# Patient Record
Sex: Female | Born: 1965 | Race: White | Hispanic: No | Marital: Married | Smoking: Never smoker
Health system: Southern US, Community
[De-identification: ages and names within clinical notes are randomized; demographics above are authoritative.]

## PROBLEM LIST (undated history)

## (undated) DIAGNOSIS — I10 Essential (primary) hypertension: Secondary | ICD-10-CM

## (undated) DIAGNOSIS — R51 Headache: Secondary | ICD-10-CM

## (undated) DIAGNOSIS — R29898 Other symptoms and signs involving the musculoskeletal system: Secondary | ICD-10-CM

## (undated) DIAGNOSIS — F329 Major depressive disorder, single episode, unspecified: Secondary | ICD-10-CM

## (undated) DIAGNOSIS — R519 Headache, unspecified: Secondary | ICD-10-CM

## (undated) DIAGNOSIS — Z9689 Presence of other specified functional implants: Secondary | ICD-10-CM

## (undated) DIAGNOSIS — R569 Unspecified convulsions: Secondary | ICD-10-CM

## (undated) DIAGNOSIS — M549 Dorsalgia, unspecified: Secondary | ICD-10-CM

## (undated) DIAGNOSIS — F419 Anxiety disorder, unspecified: Secondary | ICD-10-CM

## (undated) DIAGNOSIS — D649 Anemia, unspecified: Secondary | ICD-10-CM

## (undated) DIAGNOSIS — E119 Type 2 diabetes mellitus without complications: Secondary | ICD-10-CM

## (undated) DIAGNOSIS — E039 Hypothyroidism, unspecified: Secondary | ICD-10-CM

## (undated) DIAGNOSIS — F32A Depression, unspecified: Secondary | ICD-10-CM

## (undated) DIAGNOSIS — Z973 Presence of spectacles and contact lenses: Secondary | ICD-10-CM

## (undated) DIAGNOSIS — E785 Hyperlipidemia, unspecified: Secondary | ICD-10-CM

## (undated) DIAGNOSIS — D510 Vitamin B12 deficiency anemia due to intrinsic factor deficiency: Secondary | ICD-10-CM

## (undated) DIAGNOSIS — E669 Obesity, unspecified: Secondary | ICD-10-CM

## (undated) DIAGNOSIS — G43909 Migraine, unspecified, not intractable, without status migrainosus: Secondary | ICD-10-CM

## (undated) DIAGNOSIS — Z8781 Personal history of (healed) traumatic fracture: Secondary | ICD-10-CM

## (undated) DIAGNOSIS — Z9889 Other specified postprocedural states: Secondary | ICD-10-CM

## (undated) DIAGNOSIS — G8929 Other chronic pain: Secondary | ICD-10-CM

## (undated) HISTORY — DX: Personal history of (healed) traumatic fracture: Z87.81

## (undated) HISTORY — DX: Unspecified convulsions: R56.9

## (undated) HISTORY — DX: Type 2 diabetes mellitus without complications: E11.9

## (undated) HISTORY — DX: Major depressive disorder, single episode, unspecified: F32.9

## (undated) HISTORY — DX: Hyperlipidemia, unspecified: E78.5

## (undated) HISTORY — DX: Essential (primary) hypertension: I10

## (undated) HISTORY — DX: Vitamin B12 deficiency anemia due to intrinsic factor deficiency: D51.0

## (undated) HISTORY — PX: KNEE SURGERY: SHX244

## (undated) HISTORY — DX: Anxiety disorder, unspecified: F41.9

## (undated) HISTORY — DX: Depression, unspecified: F32.A

## (undated) HISTORY — PX: NASAL SINUS SURGERY: SHX719

## (undated) HISTORY — DX: Hypothyroidism, unspecified: E03.9

## (undated) HISTORY — DX: Anemia, unspecified: D64.9

## (undated) HISTORY — DX: Obesity, unspecified: E66.9

## (undated) HISTORY — DX: Migraine, unspecified, not intractable, without status migrainosus: G43.909

---

## 1998-11-15 ENCOUNTER — Encounter: Admission: RE | Admit: 1998-11-15 | Discharge: 1999-02-13 | Payer: Self-pay | Admitting: Family Medicine

## 1999-03-28 ENCOUNTER — Other Ambulatory Visit: Admission: RE | Admit: 1999-03-28 | Discharge: 1999-03-28 | Payer: Self-pay | Admitting: Orthopaedic Surgery

## 2000-04-15 ENCOUNTER — Encounter: Payer: Self-pay | Admitting: Family Medicine

## 2001-04-15 HISTORY — PX: OTHER SURGICAL HISTORY: SHX169

## 2001-04-20 ENCOUNTER — Ambulatory Visit (HOSPITAL_BASED_OUTPATIENT_CLINIC_OR_DEPARTMENT_OTHER): Admission: RE | Admit: 2001-04-20 | Discharge: 2001-04-20 | Payer: Self-pay | Admitting: Family Medicine

## 2002-01-13 HISTORY — PX: OTHER SURGICAL HISTORY: SHX169

## 2002-02-13 HISTORY — PX: LIVER BIOPSY: SHX301

## 2002-02-13 HISTORY — PX: CHOLECYSTECTOMY: SHX55

## 2003-09-16 HISTORY — PX: DILATION AND CURETTAGE OF UTERUS: SHX78

## 2003-09-22 ENCOUNTER — Other Ambulatory Visit: Payer: Self-pay

## 2004-07-23 ENCOUNTER — Ambulatory Visit: Payer: Self-pay | Admitting: Family Medicine

## 2004-09-02 ENCOUNTER — Ambulatory Visit: Payer: Self-pay | Admitting: Family Medicine

## 2004-09-26 ENCOUNTER — Ambulatory Visit: Payer: Self-pay | Admitting: Family Medicine

## 2004-11-07 ENCOUNTER — Ambulatory Visit: Payer: Self-pay | Admitting: Family Medicine

## 2004-11-13 ENCOUNTER — Ambulatory Visit: Payer: Self-pay | Admitting: Family Medicine

## 2004-12-10 ENCOUNTER — Ambulatory Visit: Payer: Self-pay | Admitting: Family Medicine

## 2005-01-14 ENCOUNTER — Encounter: Payer: Self-pay | Admitting: Unknown Physician Specialty

## 2005-03-05 ENCOUNTER — Ambulatory Visit: Payer: Self-pay | Admitting: Family Medicine

## 2005-04-01 ENCOUNTER — Ambulatory Visit: Payer: Self-pay | Admitting: Family Medicine

## 2005-04-18 ENCOUNTER — Ambulatory Visit: Payer: Self-pay | Admitting: Family Medicine

## 2005-05-14 ENCOUNTER — Ambulatory Visit: Payer: Self-pay | Admitting: Family Medicine

## 2005-07-21 ENCOUNTER — Ambulatory Visit: Payer: Self-pay | Admitting: Family Medicine

## 2005-08-01 ENCOUNTER — Ambulatory Visit: Payer: Self-pay | Admitting: Family Medicine

## 2005-08-05 ENCOUNTER — Ambulatory Visit: Payer: Self-pay | Admitting: Family Medicine

## 2005-09-24 ENCOUNTER — Encounter: Payer: Self-pay | Admitting: Orthopaedic Surgery

## 2005-10-16 ENCOUNTER — Encounter: Payer: Self-pay | Admitting: Orthopaedic Surgery

## 2005-11-05 ENCOUNTER — Ambulatory Visit: Payer: Self-pay | Admitting: Family Medicine

## 2005-12-01 ENCOUNTER — Ambulatory Visit: Payer: Self-pay | Admitting: Family Medicine

## 2005-12-08 ENCOUNTER — Ambulatory Visit: Payer: Self-pay | Admitting: Family Medicine

## 2005-12-14 ENCOUNTER — Ambulatory Visit: Payer: Self-pay | Admitting: Family Medicine

## 2006-02-06 ENCOUNTER — Ambulatory Visit: Payer: Self-pay | Admitting: Family Medicine

## 2006-03-24 ENCOUNTER — Ambulatory Visit: Payer: Self-pay | Admitting: Family Medicine

## 2006-05-05 ENCOUNTER — Ambulatory Visit: Payer: Self-pay | Admitting: Family Medicine

## 2006-05-21 ENCOUNTER — Encounter: Admission: RE | Admit: 2006-05-21 | Discharge: 2006-05-21 | Payer: Self-pay | Admitting: Family Medicine

## 2006-06-15 ENCOUNTER — Ambulatory Visit: Payer: Self-pay | Admitting: Internal Medicine

## 2006-09-04 ENCOUNTER — Ambulatory Visit: Payer: Self-pay | Admitting: Family Medicine

## 2006-10-02 ENCOUNTER — Ambulatory Visit: Payer: Self-pay | Admitting: Family Medicine

## 2006-10-02 LAB — CONVERTED CEMR LAB
ALT: 43 units/L — ABNORMAL HIGH (ref 0–40)
AST: 27 units/L (ref 0–37)
LDL Cholesterol: 49 mg/dL (ref 0–99)
Total CHOL/HDL Ratio: 3.2
VLDL: 27 mg/dL (ref 0–40)

## 2006-10-22 ENCOUNTER — Ambulatory Visit: Payer: Self-pay | Admitting: Family Medicine

## 2006-12-15 ENCOUNTER — Ambulatory Visit: Payer: Self-pay | Admitting: Family Medicine

## 2006-12-21 ENCOUNTER — Ambulatory Visit: Payer: Self-pay | Admitting: Family Medicine

## 2006-12-21 LAB — CONVERTED CEMR LAB: Hgb A1c MFr Bld: 6.5 % — ABNORMAL HIGH (ref 4.6–6.0)

## 2007-05-15 ENCOUNTER — Encounter: Payer: Self-pay | Admitting: Family Medicine

## 2007-05-18 ENCOUNTER — Encounter: Payer: Self-pay | Admitting: Family Medicine

## 2007-05-18 DIAGNOSIS — F329 Major depressive disorder, single episode, unspecified: Secondary | ICD-10-CM | POA: Insufficient documentation

## 2007-05-18 DIAGNOSIS — F411 Generalized anxiety disorder: Secondary | ICD-10-CM | POA: Insufficient documentation

## 2007-05-18 DIAGNOSIS — E669 Obesity, unspecified: Secondary | ICD-10-CM | POA: Insufficient documentation

## 2007-05-18 DIAGNOSIS — E119 Type 2 diabetes mellitus without complications: Secondary | ICD-10-CM | POA: Insufficient documentation

## 2007-05-18 DIAGNOSIS — N912 Amenorrhea, unspecified: Secondary | ICD-10-CM | POA: Insufficient documentation

## 2007-05-18 DIAGNOSIS — H9120 Sudden idiopathic hearing loss, unspecified ear: Secondary | ICD-10-CM | POA: Insufficient documentation

## 2007-05-18 DIAGNOSIS — K589 Irritable bowel syndrome without diarrhea: Secondary | ICD-10-CM | POA: Insufficient documentation

## 2007-05-18 DIAGNOSIS — L219 Seborrheic dermatitis, unspecified: Secondary | ICD-10-CM | POA: Insufficient documentation

## 2007-05-21 ENCOUNTER — Ambulatory Visit: Payer: Self-pay | Admitting: Family Medicine

## 2007-05-21 DIAGNOSIS — IMO0002 Reserved for concepts with insufficient information to code with codable children: Secondary | ICD-10-CM | POA: Insufficient documentation

## 2007-05-21 DIAGNOSIS — E7800 Pure hypercholesterolemia, unspecified: Secondary | ICD-10-CM | POA: Insufficient documentation

## 2007-05-24 ENCOUNTER — Encounter: Payer: Self-pay | Admitting: Family Medicine

## 2007-05-24 LAB — CONVERTED CEMR LAB
AST: 23 units/L (ref 0–37)
CO2: 29 meq/L (ref 19–32)
Chloride: 105 meq/L (ref 96–112)
Creatinine, Ser: 0.8 mg/dL (ref 0.4–1.2)
GFR calc Af Amer: 102 mL/min
GFR calc non Af Amer: 84 mL/min
Hgb A1c MFr Bld: 7.7 % — ABNORMAL HIGH (ref 4.6–6.0)
Phosphorus: 2.9 mg/dL (ref 2.3–4.6)
Sodium: 139 meq/L (ref 135–145)
Triglycerides: 129 mg/dL (ref 0–149)
VLDL: 26 mg/dL (ref 0–40)

## 2007-05-26 ENCOUNTER — Telehealth: Payer: Self-pay | Admitting: Family Medicine

## 2007-06-02 ENCOUNTER — Telehealth (INDEPENDENT_AMBULATORY_CARE_PROVIDER_SITE_OTHER): Payer: Self-pay | Admitting: *Deleted

## 2007-06-10 ENCOUNTER — Encounter: Payer: Self-pay | Admitting: Family Medicine

## 2007-06-15 ENCOUNTER — Encounter: Payer: Self-pay | Admitting: Family Medicine

## 2007-06-23 ENCOUNTER — Ambulatory Visit: Payer: Self-pay | Admitting: Family Medicine

## 2007-06-28 ENCOUNTER — Encounter: Payer: Self-pay | Admitting: Family Medicine

## 2007-08-10 ENCOUNTER — Encounter: Payer: Self-pay | Admitting: Family Medicine

## 2007-08-25 ENCOUNTER — Ambulatory Visit: Payer: Self-pay | Admitting: Family Medicine

## 2007-08-25 DIAGNOSIS — K5289 Other specified noninfective gastroenteritis and colitis: Secondary | ICD-10-CM | POA: Insufficient documentation

## 2007-09-16 HISTORY — PX: BACK SURGERY: SHX140

## 2007-09-29 ENCOUNTER — Ambulatory Visit: Payer: Self-pay | Admitting: Family Medicine

## 2007-09-29 DIAGNOSIS — M25519 Pain in unspecified shoulder: Secondary | ICD-10-CM | POA: Insufficient documentation

## 2007-09-29 DIAGNOSIS — I10 Essential (primary) hypertension: Secondary | ICD-10-CM | POA: Insufficient documentation

## 2007-10-01 LAB — CONVERTED CEMR LAB
AST: 27 units/L (ref 0–37)
Albumin: 3.7 g/dL (ref 3.5–5.2)
BUN: 11 mg/dL (ref 6–23)
CO2: 30 meq/L (ref 19–32)
Cholesterol: 139 mg/dL (ref 0–200)
Creatinine, Ser: 0.9 mg/dL (ref 0.4–1.2)
GFR calc Af Amer: 89 mL/min
Glucose, Bld: 214 mg/dL — ABNORMAL HIGH (ref 70–99)
HDL: 37.8 mg/dL — ABNORMAL LOW (ref >39.0)
Microalb Creat Ratio: 11.6 mg/g (ref 0.0–30.0)
Potassium: 4.6 meq/L (ref 3.5–5.1)
Sodium: 138 meq/L (ref 135–145)
Total CHOL/HDL Ratio: 3.7
Triglycerides: 131 mg/dL (ref 0–149)
VLDL: 26 mg/dL (ref 0–40)

## 2007-10-22 ENCOUNTER — Encounter: Payer: Self-pay | Admitting: Family Medicine

## 2007-10-28 ENCOUNTER — Ambulatory Visit: Payer: Self-pay | Admitting: Family Medicine

## 2007-10-28 DIAGNOSIS — E039 Hypothyroidism, unspecified: Secondary | ICD-10-CM | POA: Insufficient documentation

## 2007-10-28 DIAGNOSIS — E038 Other specified hypothyroidism: Secondary | ICD-10-CM | POA: Insufficient documentation

## 2007-10-29 LAB — CONVERTED CEMR LAB
Free T4: 0.8 ng/dL (ref 0.6–1.6)
T4, Total: 7.8 ug/dL (ref 5.0–12.5)

## 2007-11-22 ENCOUNTER — Ambulatory Visit: Payer: Self-pay | Admitting: Family Medicine

## 2007-11-22 DIAGNOSIS — J069 Acute upper respiratory infection, unspecified: Secondary | ICD-10-CM | POA: Insufficient documentation

## 2007-12-01 ENCOUNTER — Ambulatory Visit: Payer: Self-pay | Admitting: Family Medicine

## 2007-12-01 DIAGNOSIS — J019 Acute sinusitis, unspecified: Secondary | ICD-10-CM | POA: Insufficient documentation

## 2007-12-05 ENCOUNTER — Emergency Department: Payer: Self-pay | Admitting: Emergency Medicine

## 2007-12-28 ENCOUNTER — Ambulatory Visit: Payer: Self-pay | Admitting: Family Medicine

## 2008-02-10 ENCOUNTER — Ambulatory Visit: Payer: Self-pay | Admitting: Family Medicine

## 2008-02-10 DIAGNOSIS — R55 Syncope and collapse: Secondary | ICD-10-CM | POA: Insufficient documentation

## 2008-02-10 DIAGNOSIS — R609 Edema, unspecified: Secondary | ICD-10-CM | POA: Insufficient documentation

## 2008-02-25 ENCOUNTER — Encounter: Payer: Self-pay | Admitting: Family Medicine

## 2008-03-06 ENCOUNTER — Encounter: Payer: Self-pay | Admitting: Family Medicine

## 2008-03-17 ENCOUNTER — Encounter: Payer: Self-pay | Admitting: Family Medicine

## 2008-03-21 ENCOUNTER — Encounter: Payer: Self-pay | Admitting: Family Medicine

## 2008-05-04 ENCOUNTER — Encounter: Payer: Self-pay | Admitting: Family Medicine

## 2008-05-08 ENCOUNTER — Encounter: Payer: Self-pay | Admitting: Family Medicine

## 2008-05-26 ENCOUNTER — Encounter: Payer: Self-pay | Admitting: Family Medicine

## 2008-06-04 ENCOUNTER — Encounter: Admission: RE | Admit: 2008-06-04 | Discharge: 2008-06-04 | Payer: Self-pay | Admitting: Neurosurgery

## 2008-06-20 ENCOUNTER — Inpatient Hospital Stay (HOSPITAL_COMMUNITY): Admission: RE | Admit: 2008-06-20 | Discharge: 2008-06-24 | Payer: Self-pay | Admitting: Neurosurgery

## 2008-06-30 ENCOUNTER — Encounter: Payer: Self-pay | Admitting: Family Medicine

## 2008-07-12 ENCOUNTER — Encounter: Payer: Self-pay | Admitting: Family Medicine

## 2008-08-14 ENCOUNTER — Encounter: Payer: Self-pay | Admitting: Family Medicine

## 2008-09-28 ENCOUNTER — Telehealth: Payer: Self-pay | Admitting: Family Medicine

## 2008-10-27 ENCOUNTER — Encounter: Payer: Self-pay | Admitting: Family Medicine

## 2008-11-14 ENCOUNTER — Encounter: Payer: Self-pay | Admitting: Family Medicine

## 2008-11-24 ENCOUNTER — Encounter: Payer: Self-pay | Admitting: Family Medicine

## 2008-11-24 ENCOUNTER — Ambulatory Visit: Payer: Self-pay | Admitting: Family Medicine

## 2008-11-30 ENCOUNTER — Encounter (INDEPENDENT_AMBULATORY_CARE_PROVIDER_SITE_OTHER): Payer: Self-pay | Admitting: *Deleted

## 2008-12-25 ENCOUNTER — Encounter: Payer: Self-pay | Admitting: Family Medicine

## 2009-01-10 ENCOUNTER — Encounter: Payer: Self-pay | Admitting: Family Medicine

## 2009-01-24 ENCOUNTER — Encounter: Payer: Self-pay | Admitting: Family Medicine

## 2009-02-05 ENCOUNTER — Ambulatory Visit: Payer: Self-pay | Admitting: Unknown Physician Specialty

## 2009-02-09 ENCOUNTER — Encounter: Payer: Self-pay | Admitting: Family Medicine

## 2009-02-09 ENCOUNTER — Ambulatory Visit: Payer: Self-pay | Admitting: Unknown Physician Specialty

## 2009-03-08 ENCOUNTER — Ambulatory Visit: Payer: Self-pay | Admitting: Family Medicine

## 2009-04-09 ENCOUNTER — Ambulatory Visit: Payer: Self-pay | Admitting: Family Medicine

## 2009-05-04 ENCOUNTER — Telehealth: Payer: Self-pay | Admitting: Family Medicine

## 2009-05-25 ENCOUNTER — Ambulatory Visit: Payer: Self-pay | Admitting: Family Medicine

## 2009-05-28 ENCOUNTER — Telehealth: Payer: Self-pay | Admitting: Family Medicine

## 2009-10-09 ENCOUNTER — Ambulatory Visit: Payer: Self-pay | Admitting: Otolaryngology

## 2009-11-27 ENCOUNTER — Ambulatory Visit: Payer: Self-pay | Admitting: Internal Medicine

## 2010-06-25 ENCOUNTER — Encounter: Payer: Self-pay | Admitting: Cardiovascular Disease

## 2010-06-25 ENCOUNTER — Ambulatory Visit: Payer: Self-pay | Admitting: Internal Medicine

## 2010-06-27 ENCOUNTER — Encounter: Payer: Self-pay | Admitting: Cardiovascular Disease

## 2010-06-27 ENCOUNTER — Ambulatory Visit: Payer: Self-pay | Admitting: Internal Medicine

## 2010-07-18 ENCOUNTER — Encounter: Payer: Self-pay | Admitting: Cardiovascular Disease

## 2010-08-06 ENCOUNTER — Ambulatory Visit: Payer: Self-pay | Admitting: Cardiovascular Disease

## 2010-08-06 DIAGNOSIS — R0602 Shortness of breath: Secondary | ICD-10-CM | POA: Insufficient documentation

## 2010-08-06 DIAGNOSIS — E663 Overweight: Secondary | ICD-10-CM | POA: Insufficient documentation

## 2010-08-27 ENCOUNTER — Telehealth: Payer: Self-pay | Admitting: Cardiovascular Disease

## 2010-09-26 ENCOUNTER — Encounter
Admission: RE | Admit: 2010-09-26 | Discharge: 2010-09-26 | Payer: Self-pay | Source: Home / Self Care | Attending: Neurology | Admitting: Neurology

## 2010-10-17 NOTE — Assessment & Plan Note (Signed)
Summary: angina/sgc, cma   Visit Type:  Initial Consult Primary Provider:  Ronna Polio, M.D.  CC:  c/o rapid heart beats and headache. She has shortness of breath when walking across the parking lot.Marland Kitchen  History of Present Illness: [name] is a 44 year old, patient of Dr. Dan Humphreys, with history of headaches, obesity, diabetes that is controlled, hyperlipidemia who currently uses an insulin pump who presents for evaluation of palpitations and headaches.   she did not exercise. She recently started going to the gym and reports that during a aerobics class and while on the elliptical she had significant shortness of breath, pounding in her chest and headaches. She denies any chest pain, worsening edema, lightheadedness or dizziness.  Her sugars have been running in the high 100s, low 200s. Her hemoglobin A1c has decreased from 11 to 9.   Recent CT scan of October of this year showing encephalomalacia on the right frontoparietal region consistent with old stroke  Stress test from last month showing normal EKG, exercise for 3 minutes, peak heart rate 166 beats per minute, peak blood pressure 160/70  EKG shows normal sinus rhythm with rate 91 beats per minute, no significant ST or T wave changes  Preventive Screening-Counseling & Management  Alcohol-Tobacco     Smoking Status: never  Caffeine-Diet-Exercise     Does Patient Exercise: no      Drug Use:  no.    Current Medications (verified): 1)  Lisinopril 10 Mg  Tabs (Lisinopril) .... Take One By Mouth Daily 2)  Novolog Flexpen 100 Unit/ml Soln (Insulin Aspart) .Marland Kitchen.. 13 Units Three Times A Day 3)  Symlinpen 120 1000 Mcg/ml Soln (Pramlintide Acetate) .... Three Times A Day 4)  Lantus Solostar 100 Unit/ml Soln (Insulin Glargine) .... 60 Units Daily 5)  Effexor Xr 150 Mg Xr24h-Cap (Venlafaxine Hcl) .Marland Kitchen.. 1 Each Morning For Depression 6)  Aleve 220 Mg Tabs (Naproxen Sodium) .... As Needed 7)  Excedrin Extra Strength 250-250-65 Mg Tabs  (Aspirin-Acetaminophen-Caffeine) .... As Needed 8)  Zithromax Z-Pak 250 Mg Tabs (Azithromycin) .... Take By Mouth As Directed 9)  Tessalon 200 Mg Caps (Benzonatate) .Marland Kitchen.. 1 By Mouth Up To Three Times A Day As Needed Cough 10)  Prilosec 20 Mg Cpdr (Omeprazole) .... One Tablet Once Daily 11)  Lipitor 20 Mg Tabs (Atorvastatin Calcium) .... One Tablet Qhd 12)  Synthroid 100 Mcg Tabs (Levothyroxine Sodium) .... One Tablet Once Daily  Allergies (verified): 1)  ! Penicillin 2)  * Glucotrol 3)  Glucophage  Past History:  Past Medical History: Last updated: 12/28/2007 Anxiety Depression Diabetes mellitus, type II obesity  hypothyroid HTN  hyperlipidemia  Past Surgical History: Last updated: 04/09/2009 Cholecystectomy (02/2002) GYN surgery- D & C (09/2003) Sinus surgery Left knee surgery- arthroscopic Sleep study- apnea (04/2001) Abd [place]- gallstones (01/2002) Liver biopsy- fatty liver (02/2002) back surgery- decompressive laminectomy 2009  Family History: Last updated: 16-Aug-2010 Father: Deceased; lung cancer Mother: Living; MI Siblings: 1 sister, overweight, HTN  Social History: Last updated: Aug 16, 2010 Marital Status: single Children: none Occupation: Lexicographer Tobacco Use - No.  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  Alcohol Use - yes Regular Exercise - no Drug Use - no Does Patient Exercise:  no Drug  Use:  no  Review of Systems       The patient complains of shortness of breath.  The patient denies fever, weight loss, weight gain, vision loss, decreased hearing, hoarseness, chest pain, syncope, dyspnea on exertion, peripheral edema,  prolonged cough, abdominal pain, incontinence, muscle weakness, depression, and enlarged lymph nodes.         SOB, palpitations, headaches  Vital Signs:  Patient profile:   45 year old female Height:      65 inches Weight:      284.75 pounds BMI:     47.56 Pulse rate:   91 / minute BP sitting:   142 / 98  (left arm) Cuff size:   large  Vitals Entered By: Bishop Dublin, CMA (August 06, 2010 10:20 AM)  Physical Exam  General:  Well developed, well nourished, in no acute distress.Obese Head:  normocephalic and atraumatic Neck:  Neck supple, no JVD. No masses, thyromegaly or abnormal cervical nodes. Lungs:  Clear bilaterally to auscultation and percussion. Heart:  Non-displaced PMI, chest non-tender; regular rate and rhythm, S1, S2 without murmurs, rubs or gallops. Carotid upstroke normal, no bruit.  Pedals normal pulses. No edema, no varicosities. Abdomen:  Bowel sounds positive; abdomen soft and non-tender without masses Msk:  Back normal, normal gait. Muscle strength and tone normal. Pulses:  pulses normal in all 4 extremities Extremities:  No clubbing or cyanosis. Neurologic:  Alert and oriented x 3. Skin:  Intact without lesions or rashes. Psych:  Normal affect.   Impression & Recommendations:  Problem # 1:  DYSPNEA (ICD-786.05) shortness of breath is likely secondary to her deconditioned state, obesity We have encouraged her to change her diet, and Weight Watchers, increase her exercise.  Her heart rate is elevated and this may also be part of the problem. We will start her on bystolic 5 mg daily. If this helps her symptoms, we coould keep her on this medication or change her to a generic alternative.  Her updated medication list for this problem includes:    Lisinopril 10 Mg Tabs (Lisinopril) .Marland Kitchen... Take one by mouth daily    Bystolic 5 Mg Tabs (Nebivolol hcl) .Marland Kitchen... Take 1 tablet by mouth once a day  Problem # 2:  OVERWEIGHT/OBESITY (ICD-278.02) we have encouraged  her to continue exercising though at a slower rate given her shortness of breath with moderate activity. She is very deconditioned as seen by her treadmill where she only walked for 3 minutes.  Problem # 3:  DIABETES MELLITUS, TYPE II (ICD-250.00) we have talked to her about her diabetes and giving her a dietary guide. We have stressed the importance of better sugar control.  Her updated medication list for this problem includes:    Lisinopril 10 Mg Tabs (Lisinopril) .Marland Kitchen... Take one by mouth daily    Novolog Flexpen 100 Unit/ml Soln (Insulin aspart) .Marland Kitchen... 13 units three times a day    Symlinpen 120 1000 Mcg/ml Soln (Pramlintide acetate) .Marland Kitchen... Three times a day    Lantus Solostar 100 Unit/ml Soln (Insulin glargine) .Marland KitchenMarland KitchenMarland KitchenMarland Kitchen 60 units daily  Problem # 4:  HYPERCHOLESTEROLEMIA, PURE (ICD-272.0) Her cholesterol should improve with better diabetes control  Her updated medication list for this problem includes:    Lipitor 20 Mg Tabs (Atorvastatin calcium) ..... One tablet qhd  Patient Instructions: 1)  Your physician recommends that you schedule a follow-up appointment as needed  2)  Your physician has recommended you make the following change in your medication: Start taking Bystolic 5mg  by mouth  once daily to see if helps with elevated HR and Headache. Samples given at OV. Pt will call back for rx. Prescriptions: BYSTOLIC 5 MG TABS (NEBIVOLOL HCL) Take 1 tablet by mouth once a day  #28 x 0   Entered by:   Cloyde Reams RN   Authorized by:   Dossie Arbour MD   Signed by:   Cloyde Reams RN on 08/06/2010   Method used:   Samples Given   RxID:   [id number]

## 2010-10-17 NOTE — Letter (Signed)
Summary: NPP  NPP   Imported By: Marylou Mccoy 08/19/2010 15:09:39  _____________________________________________________________________  External Attachment:    Type:   Image     Comment:   External Document

## 2010-10-17 NOTE — Letter (Signed)
Summary[employer] Office Note   [facility] Office Note   Imported By: Roderic Ovens 08/21/2010 15:39:17  _____________________________________________________________________  External Attachment:    Type:   Image     Comment:   External Document

## 2010-10-17 NOTE — Progress Notes (Signed)
Summary: RX  Phone Note Refill Request Call back at Home Phone [phone] Call back at Work Phone [phone] Message from:  Patient on August 27, 2010 9:10 AM  Refills Requested: Medication #1:  BYSTOLIC 5 MG TABS Take 1 tablet by mouth once a day. [facility] [address] [facility] [place]  Initial call taken by: Harlon Flor,  August 27, 2010 9:10 AM    Prescriptions: BYSTOLIC 5 MG TABS (NEBIVOLOL HCL) Take 1 tablet by mouth once a day  #28 x 0   Entered by:   Lysbeth Galas CMA   Authorized by:   Dossie Arbour MD   Signed by:   Lysbeth Galas CMA on 08/27/2010   Method used:   Elect[facility]ically to        [facility]  [address] (retail)       [address]       [place], [place]  [address]       Ph: [phone] or [phone]       Fax: [phone]   RxID:   [id number]

## 2010-12-10 ENCOUNTER — Ambulatory Visit: Payer: Self-pay | Admitting: Family Medicine

## 2011-01-28 NOTE — Op Note (Signed)
[name], [name]              ACCOUNT NO.:  [id number]   MEDICAL RECORD NO.:  [id number]          PATIENT TYPE:  OIB   LOCATION:  3032                         FACILITY:  [facility]   PHYSICIAN:  Clydene Fake, M.D.  DATE OF BIRTH:  Nov 25, 1965   DATE OF PROCEDURE:  06/20/2008  DATE OF DISCHARGE:                               OPERATIVE REPORT   PREOPERATIVE DIAGNOSES:  Herniated nucleus pulposus, spondylosis, with  stenosis, L4-5 and L5-S1 with right-sided radiculopathy.   POSTOPERATIVE DIAGNOSES:  Herniated nucleus pulposus, spondylosis, with  stenosis, L4-5 and L5-S1 with right-sided radiculopathy.   PROCEDURE:  Decompressive laminectomy decompressing the L4, L5, and S1  roots on the right (3 levels), L4-5 diskectomy, and microdissection with  microscope.   SURGEON:  Clydene Fake, MD   ASSISTANT:  Hilda Lias, MD   ANESTHESIA:  General endotracheal tube anesthesia.   ESTIMATED BLOOD LOSS:  Minimal.   BLOOD GIVEN:  None.   DRAINS:  None.   COMPLICATIONS:  Dural rent occurred at L4-5 level, repaired primarily  with Prolene, also covered with DuraGen and Tisseel tissue glue.   REASON FOR PROCEDURE:  The patient is a 45 year old woman with back and  mainly right leg pain with some numbness.  Steroid dose packs have  helped but only briefly.  The patient is brought in for decompression.   PROCEDURE IN DETAIL:  The patient was brought to the operating room and  general anesthesia was induced.  The patient was placed in prone  position on a Wilson frame with all pressure points padded.  The patient  was prepped and draped in a sterile fashion.  __________ incision was  injected with 20 mL of 1% lidocaine with epinephrine.  Needle was placed  __________ interspace.  X-ray was obtained showing __________ L4 and L5  interspace.  An incision was then made from just above where the needle  was down inferiorly.  Incision was taken down to the fascia.  Hemostasis  was  obtained with Bovie cauterization.  The fascia was incised on the  right side and subperiosteal dissection done over the L4, L5, and S1  spinous processes and lamina up to the facets.  Markers were placed in  the L5-S1 and L4-5 interspaces.  Another x-ray was obtained confirming  our positioning again at L4-5 and L5-S1.  Self-retaining retractors were  placed.  Microscope was brought in for microdissection at this point.  High-speed drill was used to start semihemilaminectomy and medial  facetectomy at L4-5 and then at L5-S1.  This was completed with Kerrison  punches.  We removed hypertrophic ligament and did a generous  foraminotomy over the L5 root at that L4-5 level and took some of the  top part of the L5 lamina from the L4-5 level.  We used curettes to  remove the posterior ligaments for further posterior decompression.  We  used Kerrison punches to remove the lateral ligament and explored disk  space and saw a large disk bulge with acute herniation coming out of top  of that.  We incised the disk space and diskectomy was done with  pituitary rongeurs and curettes.  The area was basically extremely tight  as we started, and as we continued we found central disk herniation,  pushing this down with curettes and removed disk with pituitary  rongeurs.  A dural rent occurred anterior to the dura at the superior  aspect of the L4-L5 disk.  We __________ that with Gelfoam and padding  as we continued the decompression, and when we were finished we had good  decompression of the central canal and the L4 and L5 roots.  Attention  was then taken to L5-S1 level.  __________ Kerrison punch was used to  remove the ligamentum flavum and do a decompressive laminectomy and made  sure the L5 and S1 roots were decompressed and a foraminotomy done over  the S1 root.  We explored the disk space and saw a large central  calcified disk bulge, but after the decompression we felt things were  well  decompressed.  There was no acute component.  There was no free  fragment of disk, and we did not enter the disk space.  We used curettes  to reach over towards the opposite side to remove posterior ligament to  make sure we had good posterior decompression.  We __________ made sure  that we did a good decompression of the L5 and S1 roots.  Attention was  then taken back to the L4-5 area.  We explored the nerve roots.  We had  good decompression.  We then looked at the dural rent, and we were able  to place a 6-0 Prolene across the rent and perform a suture, and after  that suture was performed, we had no further leak of CSF.  A piece of  DuraGen was cut and then placed over this area, and then this area was  sealed with Tisseel tissue glue.  Retractor was removed.  Fascia was  closed with 0 Vicryl interrupted suture.  We irrigated with antibiotic  solution and then we closed the subcutaneous tissue with 0, 2-0, and 3-0  Vicryl interrupted sutures.  Skin was closed with running 3-0 nylon  suture.  A dressing was placed.  The patient was placed back in the  supine position and transferred to recovery room in stable condition.           ______________________________  Clydene Fake, M.D.     JRH/MEDQ  D:  06/20/2008  T:  06/21/2008  Job:  [id number]

## 2011-04-09 ENCOUNTER — Encounter: Payer: Self-pay | Admitting: Cardiovascular Disease

## 2011-04-28 ENCOUNTER — Telehealth: Payer: Self-pay

## 2011-04-28 MED ORDER — NEBIVOLOL HCL 5 MG PO TABS: 5.0000 mg | ORAL_TABLET | Freq: Every day | ORAL | Status: DC

## 2011-04-28 NOTE — Telephone Encounter (Signed)
Refill requested for bystolic 5 mg take one tablet daily.

## 2011-05-16 ENCOUNTER — Ambulatory Visit: Payer: Self-pay | Admitting: Internal Medicine

## 2011-05-26 ENCOUNTER — Other Ambulatory Visit: Payer: Self-pay | Admitting: *Deleted

## 2011-05-26 MED ORDER — INSULIN REGULAR HUMAN 100 UNIT/ML IJ SOLN: 1.0000 [IU] | INTRAMUSCULAR | Status: DC

## 2011-05-27 DIAGNOSIS — Z23 Encounter for immunization: Secondary | ICD-10-CM

## 2011-05-28 ENCOUNTER — Ambulatory Visit (INDEPENDENT_AMBULATORY_CARE_PROVIDER_SITE_OTHER): Payer: PRIVATE HEALTH INSURANCE | Admitting: Internal Medicine

## 2011-05-28 ENCOUNTER — Encounter: Payer: Self-pay | Admitting: Internal Medicine

## 2011-05-28 DIAGNOSIS — E785 Hyperlipidemia, unspecified: Secondary | ICD-10-CM

## 2011-05-28 DIAGNOSIS — F32A Depression, unspecified: Secondary | ICD-10-CM

## 2011-05-28 DIAGNOSIS — I10 Essential (primary) hypertension: Secondary | ICD-10-CM

## 2011-05-28 DIAGNOSIS — F329 Major depressive disorder, single episode, unspecified: Secondary | ICD-10-CM

## 2011-05-28 DIAGNOSIS — E119 Type 2 diabetes mellitus without complications: Secondary | ICD-10-CM

## 2011-05-28 DIAGNOSIS — R5383 Other fatigue: Secondary | ICD-10-CM

## 2011-05-28 DIAGNOSIS — R5381 Other malaise: Secondary | ICD-10-CM

## 2011-05-28 MED ORDER — ATORVASTATIN CALCIUM 20 MG PO TABS: 20.0000 mg | ORAL_TABLET | Freq: Every day | ORAL | Status: DC

## 2011-05-28 MED ORDER — ONETOUCH ULTRASOFT LANCETS MISC: Status: DC

## 2011-05-28 MED ORDER — OMEPRAZOLE 20 MG PO CPDR: 20.0000 mg | DELAYED_RELEASE_CAPSULE | Freq: Every day | ORAL | Status: DC

## 2011-05-28 MED ORDER — VENLAFAXINE HCL ER 225 MG PO TB24: 225.0000 mg | ORAL_TABLET | Freq: Every day | ORAL | Status: DC

## 2011-05-28 MED ORDER — LISINOPRIL 10 MG PO TABS: 10.0000 mg | ORAL_TABLET | Freq: Every day | ORAL | Status: DC

## 2011-05-28 NOTE — Progress Notes (Signed)
Subjective:    Patient ID: [name], female    DOB: March 07, 1966, 45 y.o.   MRN: [id number]  HPI [name] is a 45 year old female with a history of diabetes, hypertension, hypothyroidism, and depression who presents for followup. She reports that recently her diabetes has been under good control. She has been followed by endocrine and reports her last hemoglobin A1c was 6.2. This is a remarkable decrease down from over 11 on last check. She is currently using an insulin pump and Victoza  for her diabetes. She denies any low blood sugars in the interim since her last visit. She reports most of her fasting sugars have been near 100.  [name] is concerned that her depression is not adequately controlled on her current dose of Effexor. She is interested in increasing her dose of Effexor. She reports she often feels angry and lashes out at other people. She denies any suicidal ideation.  [name] is also concerned about significant fatigue which has been persistent over the last several months. She reports that she can sleep until 11 or 12:00. She denies any shortness of breath, chest pain, weight loss, or other focal symptoms. She notes of history in the past of sleep apnea. She tried using CPAP, but was unable to tolerate the facemask. She reports that she does snore during her sleep. She finds sleep unrefreshing. She notes daytime somnolence.  Outpatient Encounter Prescriptions as of 05/28/2011  Medication Sig Dispense Refill  . aspirin-acetaminophen-caffeine (EXCEDRIN MIGRAINE) 250-250-65 MG per tablet Take 1 tablet by mouth every 6 (six) hours as needed.        Marland Kitchen atorvastatin (LIPITOR) 20 MG tablet Take 1 tablet (20 mg total) by mouth daily.  30 tablet  11  . insulin regular (HUMULIN R) 100 units/mL injection Inject 1 Units into the skin as directed.  10 mL  12  . levothyroxine (SYNTHROID, LEVOTHROID) 100 MCG tablet Take 100 mcg by mouth daily.        Marland Kitchen lisinopril (PRINIVIL,ZESTRIL) 10  MG tablet Take 1 tablet (10 mg total) by mouth daily.  30 tablet  11  . nebivolol (BYSTOLIC) 5 MG tablet Take 1 tablet (5 mg total) by mouth daily.  30 tablet  4  . omeprazole (PRILOSEC) 20 MG capsule Take 1 capsule (20 mg total) by mouth daily.  30 capsule  11    Review of Systems  Constitutional: Positive for fatigue. Negative for fever, chills, appetite change and unexpected weight change.  HENT: Negative for ear pain, congestion, sore throat, trouble swallowing, neck pain, voice change and sinus pressure.   Eyes: Negative for visual disturbance.  Respiratory: Negative for cough, shortness of breath, wheezing and stridor.   Cardiovascular: Negative for chest pain, palpitations and leg swelling.  Gastrointestinal: Negative for nausea, vomiting, abdominal pain, diarrhea, constipation, blood in stool, abdominal distention and anal bleeding.  Genitourinary: Negative for dysuria and flank pain.  Musculoskeletal: Negative for myalgias, arthralgias and gait problem.  Skin: Negative for color change and rash.  Neurological: Negative for dizziness and headaches.  Hematological: Negative for adenopathy. Does not bruise/bleed easily.  Psychiatric/Behavioral: Positive for dysphoric mood and agitation. Negative for suicidal ideas and sleep disturbance. The patient is not nervous/anxious.    BP 130/84  Pulse 80  Temp(Src) 97.5 F (36.4 C) (Oral)  Resp 20  Ht 5\' 5"  (1.651 m)  Wt 285 lb 4 oz (129.389 kg)  BMI 47.47 kg/m2  SpO2 99%     Objective:   Physical Exam  Constitutional: She is oriented to person, place, and time. She appears well-developed and well-nourished. No distress.  HENT:  Head: Normocephalic and atraumatic.  Right Ear: External ear normal.  Left Ear: External ear normal.  Nose: Nose normal.  Mouth/Throat: Oropharynx is clear and moist. No oropharyngeal exudate.  Eyes: Conjunctivae are normal. Pupils are equal, round, and reactive to light. Right eye exhibits no discharge.  Left eye exhibits no discharge. No scleral icterus.  Neck: Normal range of motion. Neck supple. No tracheal deviation present. No thyromegaly present.  Cardiovascular: Normal rate, regular rhythm, normal heart sounds and intact distal pulses.  Exam reveals no gallop and no friction rub.   No murmur heard. Pulmonary/Chest: Effort normal and breath sounds normal. No respiratory distress. She has no wheezes. She has no rales. She exhibits no tenderness.  Musculoskeletal: Normal range of motion. She exhibits no edema and no tenderness.  Lymphadenopathy:    She has no cervical adenopathy.  Neurological: She is alert and oriented to person, place, and time. No cranial nerve deficit. She exhibits normal muscle tone. Coordination normal.  Skin: Skin is warm and dry. No rash noted. She is not diaphoretic. No erythema. No pallor.  Psychiatric: Her speech is normal and behavior is normal. Judgment and thought content normal. Her affect is angry. Cognition and memory are normal. She exhibits a depressed mood.          Assessment & Plan:  1. Diabetes - patient has had tremendous improvement in her diabetes control. She reports recent hemoglobin A1c of 6.2. We will request recent lab work from her endocrinologist. She will have repeat hemoglobin A1c in 3 months. She is currently on an ACE inhibitor and a statin. Need to confirm that her optho exam is up to date. Foot exam was normal today.  2. Hypothyoidism - patient with history of hypothyroidism reports a recent significant fatigue. Will check TSH on recent labs at her endocrinologist office.  3. Depression - patient reports worsening of her depression. We'll try increasing her Effexor dose to 225 mg daily. She will return to clinic in one month to assess progress.  4. Hypertension - blood pressure has been well controlled on ACE inhibitor and beta blocker. Blood pressure is slightly elevated today. We'll continue to monitor. Will check a recent renal  function on labs at endocrinologist office.  5. Fatigue - patient with severe fatigue. Suspect this is multifactorial, given h/o depression, hypothyroidism, obesity.  Pt also has a history of sleep apnea which is currently untreated. Given that her sleep study was over 10 years ago we'll plan to repeat a sleep study and then, if necessary, set her up with CPAP.

## 2011-05-30 ENCOUNTER — Telehealth: Payer: Self-pay | Admitting: Internal Medicine

## 2011-06-02 NOTE — Telephone Encounter (Signed)
Patient's insurance will not cover the effexor and she is asking if there is something else she can take instead. [facility] [place]

## 2011-06-02 NOTE — Telephone Encounter (Signed)
I think her insurance will cover the Effexor that she was taking before. Does she want to stay on this medication?

## 2011-06-03 NOTE — Telephone Encounter (Signed)
Let's try Celexa starting at 20mg  daily. Disp 30 with 3 refills, and she will STOP effexor. Needs a 2-4 week follow up

## 2011-06-03 NOTE — Telephone Encounter (Signed)
Patient says that she would rather try something different since she feels that the effexor doesn't work as well as it did before.

## 2011-06-09 ENCOUNTER — Other Ambulatory Visit: Payer: Self-pay | Admitting: *Deleted

## 2011-06-09 MED ORDER — CITALOPRAM HYDROBROMIDE 20 MG PO TABS: 20.0000 mg | ORAL_TABLET | Freq: Every day | ORAL | Status: DC

## 2011-06-09 NOTE — Telephone Encounter (Signed)
Left message notifying patient or rx. Also asked that she call and schedule follow up.

## 2011-06-16 ENCOUNTER — Telehealth: Payer: Self-pay | Admitting: Internal Medicine

## 2011-06-16 NOTE — Telephone Encounter (Signed)
Left message for patient to return my call.

## 2011-06-16 NOTE — Telephone Encounter (Signed)
She had called last week and was unhappy with Effexor, so I sent a message offering to change to Celexa. I though she had agreed to this and you called in Celexa?

## 2011-06-16 NOTE — Telephone Encounter (Signed)
Patient called and stated that she is on effexor and celexa and the pharmcacy is confused is she supposed to be taking both of these meds.

## 2011-06-17 ENCOUNTER — Other Ambulatory Visit: Payer: Self-pay | Admitting: Internal Medicine

## 2011-06-17 LAB — CBC
HCT: 38.9
Hemoglobin: 13.1
MCHC: 33.7
MCV: 89.6
RBC: 4.35

## 2011-06-17 LAB — PREGNANCY, URINE: Preg Test, Ur: NEGATIVE

## 2011-06-17 LAB — GLUCOSE, CAPILLARY
Glucose-Capillary: 147 — ABNORMAL HIGH
Glucose-Capillary: 149 — ABNORMAL HIGH
Glucose-Capillary: 150 — ABNORMAL HIGH
Glucose-Capillary: 150 — ABNORMAL HIGH
Glucose-Capillary: 154 — ABNORMAL HIGH
Glucose-Capillary: 168 — ABNORMAL HIGH
Glucose-Capillary: 171 — ABNORMAL HIGH
Glucose-Capillary: 222 — ABNORMAL HIGH
Glucose-Capillary: 226 — ABNORMAL HIGH

## 2011-06-17 LAB — URINALYSIS, ROUTINE W REFLEX MICROSCOPIC
Hgb urine dipstick: NEGATIVE
Specific Gravity, Urine: 1.02
Urobilinogen, UA: 0.2
pH: 6

## 2011-06-17 LAB — BASIC METABOLIC PANEL
CO2: 25
Chloride: 107
GFR calc Af Amer: >60
Glucose, Bld: 132 — ABNORMAL HIGH
Potassium: 3.7
Sodium: 139

## 2011-06-17 LAB — PROTIME-INR: INR: 1

## 2011-07-01 ENCOUNTER — Ambulatory Visit (INDEPENDENT_AMBULATORY_CARE_PROVIDER_SITE_OTHER): Payer: PRIVATE HEALTH INSURANCE | Admitting: Internal Medicine

## 2011-07-01 ENCOUNTER — Encounter: Payer: Self-pay | Admitting: Internal Medicine

## 2011-07-01 VITALS — BP 118/68 | HR 76 | Temp 98.2°F | Resp 20 | Ht 65.0 in | Wt 288.8 lb

## 2011-07-01 DIAGNOSIS — Z0000 Encounter for general adult medical examination without abnormal findings: Secondary | ICD-10-CM

## 2011-07-01 DIAGNOSIS — G4730 Sleep apnea, unspecified: Secondary | ICD-10-CM

## 2011-07-01 DIAGNOSIS — F329 Major depressive disorder, single episode, unspecified: Secondary | ICD-10-CM

## 2011-07-01 DIAGNOSIS — E119 Type 2 diabetes mellitus without complications: Secondary | ICD-10-CM

## 2011-07-01 NOTE — Progress Notes (Signed)
Subjective:    Patient ID: [name], female    DOB: [birth year], 45 y.o.   MRN: [id number]  HPI [name] is a 45 year old female with a history of diabetes and depression who presents for followup. In regards to her depression, we recently changed her from Effexor to Celexa. She reports taking Celexa for approximately one week. She reports some improvement in her mood since that time. She denies any problems with the medication.  In regards to her diabetes she reports that all of her blood sugars have been below 200. She denies any low blood sugars. She reports that she has followup with her endocrinologist later this month with a scheduled hemoglobin A1c.  She notes that she recently had a sleep study performed last weekend. She has not yet heard the results of this study. She questions whether some of her fatigue may be secondary to sleep apnea.  Outpatient Encounter Prescriptions as of 07/01/2011  Medication Sig Dispense Refill  . aspirin-acetaminophen-caffeine (EXCEDRIN MIGRAINE) 250-250-65 MG per tablet Take 1 tablet by mouth every 6 (six) hours as needed.        Marland Kitchen atorvastatin (LIPITOR) 20 MG tablet Take 1 tablet (20 mg total) by mouth daily.  30 tablet  11  . citalopram (CELEXA) 20 MG tablet Take 1 tablet (20 mg total) by mouth daily.  30 tablet  3  . insulin regular (HUMULIN R) 100 units/mL injection Inject 1 Units into the skin as directed.  10 mL  12  . Lancets (ONETOUCH ULTRASOFT) lancets Use as instructed  100 each  12  . levothyroxine (SYNTHROID, LEVOTHROID) 100 MCG tablet Take 100 mcg by mouth daily.        Marland Kitchen lisinopril (PRINIVIL,ZESTRIL) 10 MG tablet Take 1 tablet (10 mg total) by mouth daily.  30 tablet  11  . nebivolol (BYSTOLIC) 5 MG tablet Take 1 tablet (5 mg total) by mouth daily.  30 tablet  4  . omeprazole (PRILOSEC) 20 MG capsule Take 1 capsule (20 mg total) by mouth daily.  30 capsule  11    Review of Systems  Constitutional: Positive for fatigue. Negative  for fever, chills, appetite change and unexpected weight change.  Eyes: Negative for visual disturbance.  Respiratory: Negative for cough and shortness of breath.   Cardiovascular: Negative for chest pain, palpitations and leg swelling.  Gastrointestinal: Negative for abdominal pain and abdominal distention.  Genitourinary: Negative for dysuria and flank pain.  Musculoskeletal: Negative for myalgias, arthralgias and gait problem.  Skin: Negative for color change and rash.  Neurological: Negative for dizziness and headaches.  Hematological: Negative for adenopathy. Does not bruise/bleed easily.  Psychiatric/Behavioral: Positive for dysphoric mood. Negative for suicidal ideas and sleep disturbance. The patient is not nervous/anxious.    BP 118/68  Pulse 76  Temp(Src) 98.2 F (36.8 C) (Oral)  Resp 20  Ht 5\' 5"  (1.651 m)  Wt 288 lb 12 oz (130.976 kg)  BMI 48.05 kg/m2  SpO2 97%     Objective:   Physical Exam  Constitutional: She is oriented to person, place, and time. She appears well-developed and well-nourished. No distress.  HENT:  Head: Normocephalic and atraumatic.  Right Ear: External ear normal.  Left Ear: External ear normal.  Nose: Nose normal.  Mouth/Throat: Oropharynx is clear and moist. No oropharyngeal exudate.  Eyes: Conjunctivae are normal. Pupils are equal, round, and reactive to light. Right eye exhibits no discharge. Left eye exhibits no discharge. No scleral icterus.  Neck: Normal range of motion.  Neck supple. No tracheal deviation present. No thyromegaly present.  Cardiovascular: Normal rate, regular rhythm, normal heart sounds and intact distal pulses.  Exam reveals no gallop and no friction rub.   No murmur heard. Pulmonary/Chest: Effort normal and breath sounds normal. No respiratory distress. She has no wheezes. She has no rales. She exhibits no tenderness.  Musculoskeletal: Normal range of motion. She exhibits no edema and no tenderness.  Lymphadenopathy:     She has no cervical adenopathy.  Neurological: She is alert and oriented to person, place, and time. No cranial nerve deficit. She exhibits normal muscle tone. Coordination normal.  Skin: Skin is warm and dry. No rash noted. She is not diaphoretic. No erythema. No pallor.  Psychiatric: She has a normal mood and affect. Her behavior is normal. Judgment and thought content normal.          Assessment & Plan:  1. Depression -improved on Celexa. We'll plan to continue. Patient will call or return to clinic if symptoms are worsening. Otherwise, she will followup in 3months.  2. Diabetes -patient with diabetes followed by endocrinology. She has scheduled followup with them including hemoglobin A1c. She will forward those records to [place]. She will continue current medications. She will followup here in 3months.  3. Sleep apnea -patient with fatigue and a previous diagnosis of sleep apnea. Her sleep study was repeated last weekend. I was able to obtain results on this study which showed moderate sleep apnea and recommended a CPAP titration trial. We will set this up for her. We will then set up CPAP as needed. She will followup in 3 months.

## 2011-07-24 ENCOUNTER — Encounter: Payer: Self-pay | Admitting: Internal Medicine

## 2011-08-12 ENCOUNTER — Telehealth: Payer: Self-pay | Admitting: Internal Medicine

## 2011-08-12 DIAGNOSIS — G4733 Obstructive sleep apnea (adult) (pediatric): Secondary | ICD-10-CM

## 2011-08-12 DIAGNOSIS — Z9989 Dependence on other enabling machines and devices: Secondary | ICD-10-CM

## 2011-08-12 NOTE — Telephone Encounter (Signed)
Received results on CPAP study which said that pt had high mask leak issues. Recommended mask fitting session.  Has pt been set up for this? If not, would recommend sending her to [facility] on [facility] for eval.

## 2011-08-13 NOTE — Telephone Encounter (Signed)
Patient informed, she says insurance is not accepted at [facility]. Referral entered

## 2011-08-13 NOTE — Telephone Encounter (Signed)
Left mess to call office back.   

## 2011-09-19 ENCOUNTER — Other Ambulatory Visit: Payer: Self-pay | Admitting: Orthopaedic Surgery

## 2011-09-19 DIAGNOSIS — M25521 Pain in right elbow: Secondary | ICD-10-CM

## 2011-09-26 ENCOUNTER — Ambulatory Visit
Admission: RE | Admit: 2011-09-26 | Discharge: 2011-09-26 | Disposition: A | Discharge status: Home / Self Care | Payer: PRIVATE HEALTH INSURANCE | Source: Ambulatory Visit | Attending: Orthopaedic Surgery | Admitting: Orthopaedic Surgery

## 2011-09-26 DIAGNOSIS — M25521 Pain in right elbow: Secondary | ICD-10-CM

## 2011-10-02 ENCOUNTER — Telehealth: Payer: Self-pay

## 2011-10-02 MED ORDER — NEBIVOLOL HCL 5 MG PO TABS: 5.0000 mg | ORAL_TABLET | Freq: Every day | ORAL | Status: DC

## 2011-10-02 NOTE — Telephone Encounter (Signed)
Refill sent for bystolic 

## 2011-10-06 ENCOUNTER — Ambulatory Visit (INDEPENDENT_AMBULATORY_CARE_PROVIDER_SITE_OTHER): Payer: PRIVATE HEALTH INSURANCE | Admitting: Internal Medicine

## 2011-10-06 ENCOUNTER — Encounter: Payer: Self-pay | Admitting: Internal Medicine

## 2011-10-06 VITALS — BP 110/64 | Temp 97.7°F | Ht 65.0 in | Wt 291.0 lb

## 2011-10-06 DIAGNOSIS — F32A Depression, unspecified: Secondary | ICD-10-CM

## 2011-10-06 DIAGNOSIS — G4733 Obstructive sleep apnea (adult) (pediatric): Secondary | ICD-10-CM

## 2011-10-06 DIAGNOSIS — F329 Major depressive disorder, single episode, unspecified: Secondary | ICD-10-CM

## 2011-10-06 DIAGNOSIS — E119 Type 2 diabetes mellitus without complications: Secondary | ICD-10-CM

## 2011-10-06 DIAGNOSIS — E669 Obesity, unspecified: Secondary | ICD-10-CM

## 2011-10-06 MED ORDER — CITALOPRAM HYDROBROMIDE 20 MG PO TABS: 20.0000 mg | ORAL_TABLET | Freq: Every day | ORAL | Status: DC

## 2011-10-06 NOTE — Assessment & Plan Note (Signed)
Pt interested in bariatric surgery. Planning to meet with physician next week.  Will forward records from our office.  Will set up monthly visits here. Encouraged her to keep a food diary and keep calories <1500 daily. Encouraged moderate physical activity such as walking most days of week. Follow up 1 month.

## 2011-10-06 NOTE — Progress Notes (Signed)
Subjective:    Patient ID: [name], female    DOB: 1965/12/12, 46 y.o.   MRN: [id number]  HPI 46 year old female with history of diabetes and obesity as well as depression presents for followup. In regards to her diabetes, she reports good control of her blood sugars with most blood sugars below 250. She denies any low blood sugars less than 80. She continues to use insulin pump.  She has been discussing with her endocrinologist the possibility of gastric bypass surgery. She plans to meet with a surgeon next week. She reports that ever since being on insulin weight loss has been very difficult. She is interested in getting off of her medications.   She reports that her symptoms of depression and anxiety are well controlled on her current dose of Celexa. She reports full compliance with this medication.  Outpatient Encounter Prescriptions as of 10/06/2011  Medication Sig Dispense Refill  . aspirin-acetaminophen-caffeine (EXCEDRIN MIGRAINE) 250-250-65 MG per tablet Take 1 tablet by mouth every 6 (six) hours as needed.        Marland Kitchen atorvastatin (LIPITOR) 20 MG tablet Take 1 tablet (20 mg total) by mouth daily.  30 tablet  11  . citalopram (CELEXA) 20 MG tablet Take 1 tablet (20 mg total) by mouth daily.  30 tablet  6  . insulin regular (HUMULIN R) 100 units/mL injection Inject 1 Units into the skin as directed.  10 mL  12  . Lancets (ONETOUCH ULTRASOFT) lancets Use as instructed  100 each  12  . levothyroxine (SYNTHROID, LEVOTHROID) 100 MCG tablet Take 100 mcg by mouth daily.        Marland Kitchen lisinopril (PRINIVIL,ZESTRIL) 10 MG tablet Take 1 tablet (10 mg total) by mouth daily.  30 tablet  11  . nebivolol (BYSTOLIC) 5 MG tablet Take 1 tablet (5 mg total) by mouth daily.  30 tablet  4  . omeprazole (PRILOSEC) 20 MG capsule Take 1 capsule (20 mg total) by mouth daily.  30 capsule  11  . DISCONTD: citalopram (CELEXA) 20 MG tablet Take 1 tablet (20 mg total) by mouth daily.  30 tablet  3    Review of  Systems  Constitutional: Negative for fever, chills, appetite change, fatigue and unexpected weight change.  HENT: Negative for ear pain, congestion, sore throat, trouble swallowing, neck pain, voice change and sinus pressure.   Eyes: Negative for visual disturbance.  Respiratory: Negative for cough, shortness of breath, wheezing and stridor.   Cardiovascular: Negative for chest pain, palpitations and leg swelling.  Gastrointestinal: Negative for nausea, vomiting, abdominal pain, diarrhea, constipation, blood in stool, abdominal distention and anal bleeding.  Genitourinary: Negative for dysuria and flank pain.  Musculoskeletal: Positive for myalgias. Negative for arthralgias and gait problem.  Skin: Negative for color change and rash.  Neurological: Negative for dizziness and headaches.  Hematological: Negative for adenopathy. Does not bruise/bleed easily.  Psychiatric/Behavioral: Negative for suicidal ideas, sleep disturbance and dysphoric mood. The patient is not nervous/anxious.    BP 110/64  Temp(Src) 97.7 F (36.5 C) (Oral)  Ht 5\' 5"  (1.651 m)  Wt 291 lb (131.997 kg)  BMI 48.43 kg/m2  SpO2 96%     Objective:   Physical Exam  Constitutional: She is oriented to person, place, and time. She appears well-developed and well-nourished. No distress.  HENT:  Head: Normocephalic and atraumatic.  Right Ear: External ear normal.  Left Ear: External ear normal.  Nose: Nose normal.  Mouth/Throat: Oropharynx is clear and moist. No oropharyngeal exudate.  Eyes: Conjunctivae are normal. Pupils are equal, round, and reactive to light. Right eye exhibits no discharge. Left eye exhibits no discharge. No scleral icterus.  Neck: Normal range of motion. Neck supple. No tracheal deviation present. No thyromegaly present.  Cardiovascular: Normal rate, regular rhythm, normal heart sounds and intact distal pulses.  Exam reveals no gallop and no friction rub.   No murmur heard. Pulmonary/Chest: Effort  normal and breath sounds normal. No respiratory distress. She has no wheezes. She has no rales. She exhibits no tenderness.  Musculoskeletal: Normal range of motion. She exhibits no edema and no tenderness.  Lymphadenopathy:    She has no cervical adenopathy.  Neurological: She is alert and oriented to person, place, and time. No cranial nerve deficit. She exhibits normal muscle tone. Coordination normal.  Skin: Skin is warm and dry. No rash noted. She is not diaphoretic. No erythema. No pallor.  Psychiatric: She has a normal mood and affect. Her behavior is normal. Judgment and thought content normal.          Assessment & Plan:

## 2011-10-06 NOTE — Assessment & Plan Note (Signed)
Symptoms improved with use of celexa. Will continue. Follow up 1 month.

## 2011-10-06 NOTE — Assessment & Plan Note (Signed)
Pt reports good control of BG.  Will get recent A1c from endocrinologist.

## 2011-10-06 NOTE — Assessment & Plan Note (Signed)
Will set up for CPAP mask fitting. Follow up 1 month.

## 2011-10-22 ENCOUNTER — Telehealth: Payer: Self-pay | Admitting: Internal Medicine

## 2011-10-22 NOTE — Telephone Encounter (Signed)
Vit D low on labs. Would like to start Drisdol 50000IU weekly x 12 weeks then repeat vit d level.

## 2011-10-23 MED ORDER — VITAMIN D (ERGOCALCIFEROL) 1.25 MG (50000 UNIT) PO CAPS: 50000.0000 [IU] | ORAL_CAPSULE | ORAL | Status: DC

## 2011-10-23 NOTE — Telephone Encounter (Signed)
Left mess to call office back, RX

## 2011-10-24 NOTE — Telephone Encounter (Signed)
Patient informed. 

## 2011-10-31 ENCOUNTER — Ambulatory Visit: Payer: Self-pay | Admitting: Specialist

## 2011-11-04 ENCOUNTER — Other Ambulatory Visit: Payer: Self-pay | Admitting: *Deleted

## 2011-11-04 MED ORDER — LEVOTHYROXINE SODIUM 100 MCG PO TABS: 100.0000 ug | ORAL_TABLET | Freq: Every day | ORAL | Status: DC

## 2011-11-06 ENCOUNTER — Telehealth: Payer: Self-pay | Admitting: Internal Medicine

## 2011-11-06 NOTE — Telephone Encounter (Signed)
Spoke w/pt - she will contact [facility]. She will contact office if anything else is needed

## 2011-11-06 NOTE — Telephone Encounter (Signed)
Pt called checking on cpap appointment

## 2011-11-10 ENCOUNTER — Ambulatory Visit: Payer: PRIVATE HEALTH INSURANCE | Admitting: Internal Medicine

## 2011-11-10 DIAGNOSIS — Z0289 Encounter for other administrative examinations: Secondary | ICD-10-CM

## 2011-11-21 ENCOUNTER — Ambulatory Visit: Payer: Self-pay | Admitting: Specialist

## 2011-12-15 ENCOUNTER — Ambulatory Visit: Payer: Self-pay | Admitting: Specialist

## 2011-12-16 ENCOUNTER — Encounter: Payer: Self-pay | Admitting: Internal Medicine

## 2011-12-25 ENCOUNTER — Ambulatory Visit: Payer: Self-pay | Admitting: Family Medicine

## 2012-01-15 ENCOUNTER — Ambulatory Visit: Payer: Self-pay | Admitting: Unknown Physician Specialty

## 2012-01-16 LAB — PATHOLOGY REPORT

## 2012-02-23 ENCOUNTER — Ambulatory Visit: Payer: Self-pay | Admitting: Specialist

## 2012-02-27 ENCOUNTER — Other Ambulatory Visit: Payer: Self-pay | Admitting: Cardiovascular Disease

## 2012-03-02 ENCOUNTER — Inpatient Hospital Stay: Payer: Self-pay | Admitting: Specialist

## 2012-03-02 HISTORY — PX: BARIATRIC SURGERY: SHX1103

## 2012-03-03 LAB — CBC WITH DIFFERENTIAL/PLATELET
Eosinophil #: 0 10*3/uL (ref 0.0–0.7)
Eosinophil %: 0.1 %
MCHC: 32.8 g/dL (ref 32.0–36.0)
MCV: 92 fL (ref 80–100)
Monocyte #: 0.5 x10 3/mm (ref 0.2–0.9)
Neutrophil #: 8.8 10*3/uL — ABNORMAL HIGH (ref 1.4–6.5)
Neutrophil %: 83.3 %
RBC: 3.99 10*6/uL (ref 3.80–5.20)

## 2012-03-03 LAB — MAGNESIUM: Magnesium: 1.7 mg/dL — ABNORMAL LOW

## 2012-03-03 LAB — BASIC METABOLIC PANEL
Anion Gap: 7 (ref 7–16)
BUN: 8 mg/dL (ref 7–18)
Calcium, Total: 7.9 mg/dL — ABNORMAL LOW (ref 8.5–10.1)
Chloride: 107 mmol/L (ref 98–107)
Creatinine: 0.82 mg/dL (ref 0.60–1.30)
EGFR (African American): >60
Glucose: 146 mg/dL — ABNORMAL HIGH (ref 65–99)
Osmolality: 280 (ref 275–301)
Potassium: 4.6 mmol/L (ref 3.5–5.1)
Sodium: 140 mmol/L (ref 136–145)

## 2012-03-03 LAB — ALBUMIN: Albumin: 3.2 g/dL — ABNORMAL LOW (ref 3.4–5.0)

## 2012-03-03 LAB — PHOSPHORUS: Phosphorus: 3.7 mg/dL (ref 2.5–4.9)

## 2012-03-04 ENCOUNTER — Telehealth: Payer: Self-pay | Admitting: Internal Medicine

## 2012-03-04 LAB — CBC WITH DIFFERENTIAL/PLATELET
Basophil #: 0 10*3/uL (ref 0.0–0.1)
Basophil %: 0.5 %
Eosinophil #: 0.1 10*3/uL (ref 0.0–0.7)
Eosinophil %: 0.8 %
Lymphocyte #: 1.8 10*3/uL (ref 1.0–3.6)
Lymphocyte %: 18.5 %
MCH: 30.5 pg (ref 26.0–34.0)
MCHC: 33.6 g/dL (ref 32.0–36.0)
Monocyte %: 6.6 %
Neutrophil #: 7.3 10*3/uL — ABNORMAL HIGH (ref 1.4–6.5)
WBC: 9.9 10*3/uL (ref 3.6–11.0)

## 2012-03-04 LAB — BASIC METABOLIC PANEL
BUN: 7 mg/dL (ref 7–18)
Calcium, Total: 8.1 mg/dL — ABNORMAL LOW (ref 8.5–10.1)
Chloride: 107 mmol/L (ref 98–107)
Co2: 25 mmol/L (ref 21–32)
Creatinine: 0.68 mg/dL (ref 0.60–1.30)
EGFR (African American): >60
Glucose: 131 mg/dL — ABNORMAL HIGH (ref 65–99)
Osmolality: 281 (ref 275–301)

## 2012-03-04 LAB — ALBUMIN: Albumin: 3.1 g/dL — ABNORMAL LOW (ref 3.4–5.0)

## 2012-03-04 LAB — PHOSPHORUS: Phosphorus: 2.4 mg/dL — ABNORMAL LOW (ref 2.5–4.9)

## 2012-03-04 NOTE — Telephone Encounter (Signed)
Hospital discharge 6/20  [facility] needed 6 week follow up 8/5

## 2012-03-05 NOTE — Telephone Encounter (Signed)
Spoke with patient via telephone, she stated that she doesn't have any questions regarding her recent hospital discharge and she will see [place] on 04/19/2012.

## 2012-03-06 ENCOUNTER — Other Ambulatory Visit: Payer: Self-pay | Admitting: Internal Medicine

## 2012-03-26 ENCOUNTER — Ambulatory Visit: Payer: Self-pay | Admitting: Specialist

## 2012-04-07 ENCOUNTER — Ambulatory Visit: Payer: PRIVATE HEALTH INSURANCE | Admitting: Cardiovascular Disease

## 2012-04-15 ENCOUNTER — Ambulatory Visit: Payer: Self-pay | Admitting: Specialist

## 2012-04-19 ENCOUNTER — Ambulatory Visit: Payer: PRIVATE HEALTH INSURANCE | Admitting: Internal Medicine

## 2012-05-04 ENCOUNTER — Telehealth: Payer: Self-pay | Admitting: Internal Medicine

## 2012-05-04 NOTE — Telephone Encounter (Signed)
I am not here this afternoon. I would recommend that she go to urgent care to have labs including CBC performed and evaluation. To make sure not anemic. Or, we can add her tomorrow.

## 2012-05-04 NOTE — Telephone Encounter (Signed)
Patient advised via telephone, she will see Dr. Dan Humphreys tomorrow.

## 2012-05-04 NOTE — Telephone Encounter (Signed)
Patient scheduled for 8.21.13 @ 10:45.

## 2012-05-04 NOTE — Telephone Encounter (Signed)
Caller: [name]/Patient; Patient Name: [name]; PCP: Ronna Polio; Best Callback Phone Number: [phone], bariatric surgery 03/02/12, not had period in over 10 years, but started having vaginal bleeding 05/01/12, like a period, still bleeding today 05/04/12, changing every 2 hours,  lightheaded in shower this am, still weak, but does not feel faint, taking fluids ok All emergent sx for Vaginal Bleeding Protocol R/o except for Moderate Vaginal Bleeding.  See in 4 hours.  Message sent in Epic for am appt.

## 2012-05-05 ENCOUNTER — Ambulatory Visit (INDEPENDENT_AMBULATORY_CARE_PROVIDER_SITE_OTHER): Payer: PRIVATE HEALTH INSURANCE | Admitting: Internal Medicine

## 2012-05-05 ENCOUNTER — Encounter: Payer: Self-pay | Admitting: Internal Medicine

## 2012-05-05 VITALS — BP 106/68 | HR 82 | Temp 97.9°F | Resp 16 | Wt 249.0 lb

## 2012-05-05 DIAGNOSIS — E7800 Pure hypercholesterolemia, unspecified: Secondary | ICD-10-CM

## 2012-05-05 DIAGNOSIS — D649 Anemia, unspecified: Secondary | ICD-10-CM | POA: Insufficient documentation

## 2012-05-05 DIAGNOSIS — E119 Type 2 diabetes mellitus without complications: Secondary | ICD-10-CM

## 2012-05-05 DIAGNOSIS — E039 Hypothyroidism, unspecified: Secondary | ICD-10-CM

## 2012-05-05 DIAGNOSIS — N920 Excessive and frequent menstruation with regular cycle: Secondary | ICD-10-CM

## 2012-05-05 LAB — HM PAP SMEAR: HM PAP: NORMAL

## 2012-05-05 LAB — HM MAMMOGRAPHY: HM Mammogram: NORMAL

## 2012-05-05 NOTE — Progress Notes (Signed)
Subjective:    Patient ID: [name], female    DOB: May 21, 1966, 46 y.o.   MRN: [id number]  HPI 46 year old female with history of diabetes, hypertension, hyperlipidemia, sleep apnea, morbid obesity presents for acute visit complaining of sudden onset of menorrhagia. She reports that she had gone for 10 years without a menstrual cycle until last week when she developed sudden onset of heavy menstrual bleeding with large blood clots. Bleeding persisted Saturday, Sunday, and Monday and she describes some lightheadedness during this time. She ultimately had blood work drawn through her employer which showed hemoglobin of 9.9. She continues to feel some fatigue. However, the bleeding has subsided. She reports some mild right-sided pelvic pain. She notes that she recently had gastric bypass surgery and has lost nearly 50 pounds. She is following a healthy diet and is exercising on a daily basis.  In regards to her history of diabetes, hypertension, and hyperlipidemia, she has been off her medications since gastric bypass surgery. She was previously on an insulin pump. She has not recently had check of her A1c, lipids or TSH. She has not been checking her blood sugars. The only medication that she continues on his levothyroxine.  Outpatient Encounter Prescriptions as of 05/05/2012  Medication Sig Dispense Refill  . aspirin-acetaminophen-caffeine (EXCEDRIN MIGRAINE) 250-250-65 MG per tablet Take 1 tablet by mouth every 6 (six) hours as needed.        Marland Kitchen b complex vitamins tablet Take 1 tablet by mouth daily.      Marland Kitchen levothyroxine (SYNTHROID, LEVOTHROID) 100 MCG tablet TAKE 1 TABLET BY MOUTH DAILY  90 tablet  0   BP 106/68  Pulse 82  Temp 97.9 F (36.6 C) (Oral)  Resp 16  Wt 249 lb (112.946 kg)  SpO2 98%  Review of Systems  Constitutional: Negative for fever, chills, appetite change, fatigue and unexpected weight change.  HENT: Negative for ear pain, congestion, sore throat, trouble swallowing,  neck pain, voice change and sinus pressure.   Eyes: Negative for visual disturbance.  Respiratory: Negative for cough, shortness of breath, wheezing and stridor.   Cardiovascular: Negative for chest pain, palpitations and leg swelling.  Gastrointestinal: Negative for nausea, vomiting, abdominal pain, diarrhea, constipation, blood in stool, abdominal distention and anal bleeding.  Genitourinary: Positive for menstrual problem and pelvic pain. Negative for dysuria and flank pain.  Musculoskeletal: Negative for myalgias, arthralgias and gait problem.  Skin: Negative for color change and rash.  Neurological: Positive for light-headedness. Negative for dizziness and headaches.  Hematological: Negative for adenopathy. Does not bruise/bleed easily.  Psychiatric/Behavioral: Negative for suicidal ideas, disturbed wake/sleep cycle and dysphoric mood. The patient is not nervous/anxious.        Objective:   Physical Exam  Constitutional: She is oriented to person, place, and time. She appears well-developed and well-nourished. No distress.  HENT:  Head: Normocephalic and atraumatic.  Right Ear: External ear normal.  Left Ear: External ear normal.  Nose: Nose normal.  Mouth/Throat: Oropharynx is clear and moist. No oropharyngeal exudate.  Eyes: Conjunctivae are normal. Pupils are equal, round, and reactive to light. Right eye exhibits no discharge. Left eye exhibits no discharge. No scleral icterus.  Neck: Normal range of motion. Neck supple. No tracheal deviation present. No thyromegaly present.  Cardiovascular: Normal rate, regular rhythm, normal heart sounds and intact distal pulses.  Exam reveals no gallop and no friction rub.   No murmur heard. Pulmonary/Chest: Effort normal and breath sounds normal. No respiratory distress. She has no wheezes. She  has no rales. She exhibits no tenderness.  Abdominal: Soft. Bowel sounds are normal. She exhibits no distension and no mass. There is tenderness  (right lower quadrant). There is no rebound and no guarding.  Musculoskeletal: Normal range of motion. She exhibits no edema and no tenderness.  Lymphadenopathy:    She has no cervical adenopathy.  Neurological: She is alert and oriented to person, place, and time. No cranial nerve deficit. She exhibits normal muscle tone. Coordination normal.  Skin: Skin is warm and dry. No rash noted. She is not diaphoretic. No erythema. No pallor.  Psychiatric: She has a normal mood and affect. Her behavior is normal. Judgment and thought content normal.          Assessment & Plan:

## 2012-05-05 NOTE — Assessment & Plan Note (Signed)
Patient continues on Synthroid. She has lost 50 pounds after gastric bypass surgery. Will check TSH prior to next visit.

## 2012-05-05 NOTE — Assessment & Plan Note (Signed)
Patient has had nearly 50 pound weight loss after gastric bypass surgery. Doing extremely well. Encouraged her to continue with healthy diet and regular physical activity.

## 2012-05-05 NOTE — Assessment & Plan Note (Signed)
After 10 years of amenorrhea, patient had sudden onset of heavy menses last week. This occurred after a 50 pound weight loss secondary to gastric bypass surgery. Question if she may have thickened endometrium and/or endometrial polyp. Will set up with GYN for evaluation to include pelvic ultrasound.

## 2012-05-05 NOTE — Assessment & Plan Note (Addendum)
Patient with anemia with hemoglobin of 9.9 on outside labs. Likely secondary to blood loss with menorrhagia. We discussed potentially starting on oral contraceptives to help minimize menstrual blood loss, but given that bleeding has subsided and she is seeing OB/GYN later this week for evaluation to include pelvic ultrasound, will hold off for now. Repeat CBC in 1 month.

## 2012-05-05 NOTE — Assessment & Plan Note (Signed)
Patient has been off insulin since having gastric bypass surgery. Will plan to check A1c with labs prior to next visit.

## 2012-05-05 NOTE — Assessment & Plan Note (Signed)
Patient had hyperlipidemia in the past and was on Lipitor. She has been off Lipitor since gastric bypass surgery. Will check lipids with labs prior to next visit.

## 2012-05-13 ENCOUNTER — Ambulatory Visit: Payer: PRIVATE HEALTH INSURANCE | Admitting: Internal Medicine

## 2012-06-02 ENCOUNTER — Ambulatory Visit: Payer: PRIVATE HEALTH INSURANCE | Admitting: Internal Medicine

## 2012-07-01 ENCOUNTER — Ambulatory Visit (INDEPENDENT_AMBULATORY_CARE_PROVIDER_SITE_OTHER): Payer: PRIVATE HEALTH INSURANCE | Admitting: Internal Medicine

## 2012-07-01 ENCOUNTER — Encounter: Payer: Self-pay | Admitting: Internal Medicine

## 2012-07-01 VITALS — BP 104/60 | HR 55 | Temp 97.8°F | Ht 65.0 in | Wt 228.5 lb

## 2012-07-01 DIAGNOSIS — N920 Excessive and frequent menstruation with regular cycle: Secondary | ICD-10-CM

## 2012-07-01 DIAGNOSIS — D649 Anemia, unspecified: Secondary | ICD-10-CM

## 2012-07-01 DIAGNOSIS — E039 Hypothyroidism, unspecified: Secondary | ICD-10-CM

## 2012-07-01 MED ORDER — LEVOTHYROXINE SODIUM 100 MCG PO TABS: 100.0000 ug | ORAL_TABLET | Freq: Every day | ORAL | Status: DC

## 2012-07-01 NOTE — Progress Notes (Signed)
Subjective:    Patient ID: [name], female    DOB: [birth year], 46 y.o.   MRN: [id number]  HPI 46 year old female with history of obesity, diabetes, and hypothyroidism presents for followup. At her previous visit, she was concerned about menorrhagia. She ultimately went to see an OB/GYN who recommended pelvic ultrasound. She felt unhappy with care at her OB/GYN's office and decided not to follow through with the ultrasound. She reports that menstrual bleeding has completely resolved. She denies any pelvic pain or discomfort. She does not want to proceed with pelvic ultrasound at this time.  In regards to her obesity, she has lost another 20 pounds over the last 2 months. She continues to follow a healthy diet and is exercising 5 times per week. She occasionally has some nausea or gagging sensation if she eats too rapidly. She has not had any vomiting. She denies any abdominal discomfort or diarrhea.  Outpatient Encounter Prescriptions as of 07/01/2012  Medication Sig Dispense Refill  . aspirin-acetaminophen-caffeine (EXCEDRIN MIGRAINE) 250-250-65 MG per tablet Take 1 tablet by mouth every 6 (six) hours as needed.        Marland Kitchen b complex vitamins tablet Take 1 tablet by mouth daily.      Marland Kitchen levothyroxine (SYNTHROID, LEVOTHROID) 100 MCG tablet Take 1 tablet (100 mcg total) by mouth daily.  90 tablet  4   BP 104/60  Pulse 55  Temp 97.8 F (36.6 C) (Oral)  Ht 5\' 5"  (1.651 m)  Wt 228 lb 8 oz (103.647 kg)  BMI 38.02 kg/m2  SpO2 92%  Review of Systems  Constitutional: Negative for fever, chills, appetite change, fatigue and unexpected weight change.  HENT: Negative for ear pain, congestion, sore throat, trouble swallowing, neck pain, voice change and sinus pressure.   Eyes: Negative for visual disturbance.  Respiratory: Negative for cough, shortness of breath, wheezing and stridor.   Cardiovascular: Negative for chest pain, palpitations and leg swelling.  Gastrointestinal: Negative for  nausea, vomiting, abdominal pain, diarrhea, constipation, blood in stool, abdominal distention and anal bleeding.  Genitourinary: Negative for dysuria and flank pain.  Musculoskeletal: Negative for myalgias, arthralgias and gait problem.  Skin: Negative for color change and rash.  Neurological: Negative for dizziness and headaches.  Hematological: Negative for adenopathy. Does not bruise/bleed easily.  Psychiatric/Behavioral: Negative for suicidal ideas, disturbed wake/sleep cycle and dysphoric mood. The patient is not nervous/anxious.        Objective:   Physical Exam  Constitutional: She is oriented to person, place, and time. She appears well-developed and well-nourished. No distress.  HENT:  Head: Normocephalic and atraumatic.  Right Ear: External ear normal.  Left Ear: External ear normal.  Nose: Nose normal.  Mouth/Throat: Oropharynx is clear and moist. No oropharyngeal exudate.  Eyes: Conjunctivae normal are normal. Pupils are equal, round, and reactive to light. Right eye exhibits no discharge. Left eye exhibits no discharge. No scleral icterus.  Neck: Normal range of motion. Neck supple. No tracheal deviation present. No thyromegaly present.  Cardiovascular: Normal rate, regular rhythm, normal heart sounds and intact distal pulses.  Exam reveals no gallop and no friction rub.   No murmur heard. Pulmonary/Chest: Effort normal and breath sounds normal. No respiratory distress. She has no wheezes. She has no rales. She exhibits no tenderness.  Musculoskeletal: Normal range of motion. She exhibits no edema and no tenderness.  Lymphadenopathy:    She has no cervical adenopathy.  Neurological: She is alert and oriented to person, place, and time. No cranial nerve  deficit. She exhibits normal muscle tone. Coordination normal.  Skin: Skin is warm and dry. No rash noted. She is not diaphoretic. No erythema. No pallor.  Psychiatric: She has a normal mood and affect. Her behavior is  normal. Judgment and thought content normal.          Assessment & Plan:

## 2012-07-01 NOTE — Assessment & Plan Note (Signed)
Symptomatically doing well. Will continue levothyroxine. Repeat TSH with labs at work next month.

## 2012-07-01 NOTE — Assessment & Plan Note (Signed)
Over 60lb weight loss after gastric bypass surgery. Encouraged pt to continue with efforts at healthy diet and regular physical activity. Follow up 6 months and prn.

## 2012-07-01 NOTE — Assessment & Plan Note (Signed)
Hgb 9.9 on recent labs. Likely secondary to menorrhagia. Will plan to repeat CBC in 1 month.

## 2012-07-01 NOTE — Assessment & Plan Note (Signed)
Symptoms have resolved. Pt was unhappy with OB/GYN care and opted not to follow through and get pelvic US. Strongly encouraged her to have pelvic US, particularly if menstrual bleeding recurs.

## 2012-10-13 ENCOUNTER — Encounter: Payer: Self-pay | Admitting: Pulmonary Disease

## 2012-10-26 ENCOUNTER — Encounter: Payer: Self-pay | Admitting: Internal Medicine

## 2012-10-26 ENCOUNTER — Ambulatory Visit (INDEPENDENT_AMBULATORY_CARE_PROVIDER_SITE_OTHER): Payer: BC Managed Care – PPO | Admitting: Internal Medicine

## 2012-10-26 VITALS — BP 106/64 | HR 60 | Temp 97.7°F | Ht 65.0 in | Wt 200.5 lb

## 2012-10-26 DIAGNOSIS — F329 Major depressive disorder, single episode, unspecified: Secondary | ICD-10-CM

## 2012-10-26 DIAGNOSIS — E039 Hypothyroidism, unspecified: Secondary | ICD-10-CM

## 2012-10-26 DIAGNOSIS — F418 Other specified anxiety disorders: Secondary | ICD-10-CM | POA: Insufficient documentation

## 2012-10-26 DIAGNOSIS — F32A Depression, unspecified: Secondary | ICD-10-CM

## 2012-10-26 MED ORDER — FLUOXETINE HCL (PMDD) 10 MG PO TABS: 10.0000 mg | ORAL_TABLET | Freq: Every day | ORAL | Status: DC

## 2012-10-26 NOTE — Assessment & Plan Note (Signed)
Symptoms recently worsened. Offered support today. Will add fluoxetine to see if any improvement. Follow up 1 month or sooner as needed.

## 2012-10-26 NOTE — Progress Notes (Signed)
Subjective:    Patient ID: [name], female    DOB: [birth year], 47 y.o.   MRN: [id number]  HPI 47YO female with h/o morbid obesity s/p gastric bypass, DM, hypothyroidism presents for follow up. Doing well. Has lost another 30lbs since visit in 06/2012. Following very high protein diet, up to 100gm per day of protein, limited carbohydrates. Exercising 5 days per week. Reports some frustration with gradual nature of weight loss.  Also notes increasing irritability, occasional depressed mood. Would like to get back on medication to help with symptoms, as she has been in the past. Previously used both Effexor and Celexa with some improvement. No suicidal ideation.  Outpatient Prescriptions Prior to Visit  Medication Sig Dispense Refill  . aspirin-acetaminophen-caffeine (EXCEDRIN MIGRAINE) 250-250-65 MG per tablet Take 1 tablet by mouth every 6 (six) hours as needed.        Marland Kitchen b complex vitamins tablet Take 1 tablet by mouth daily.      Marland Kitchen levothyroxine (SYNTHROID, LEVOTHROID) 100 MCG tablet Take 1 tablet (100 mcg total) by mouth daily.  90 tablet  4   No facility-administered medications prior to visit.   BP 106/64  Pulse 60  Temp(Src) 97.7 F (36.5 C) (Oral)  Ht 5\' 5"  (1.651 m)  Wt 200 lb 8 oz (90.946 kg)  BMI 33.36 kg/m2  SpO2 98%  Review of Systems  Constitutional: Negative for fever, chills, appetite change, fatigue and unexpected weight change.  HENT: Negative for ear pain, congestion, sore throat, trouble swallowing, neck pain, voice change and sinus pressure.   Eyes: Negative for visual disturbance.  Respiratory: Negative for cough, shortness of breath, wheezing and stridor.   Cardiovascular: Negative for chest pain, palpitations and leg swelling.  Gastrointestinal: Negative for nausea, vomiting, abdominal pain, diarrhea, constipation, blood in stool, abdominal distention and anal bleeding.  Genitourinary: Negative for dysuria and flank pain.  Musculoskeletal: Negative for  myalgias, arthralgias and gait problem.  Skin: Negative for color change and rash.  Neurological: Negative for dizziness and headaches.  Hematological: Negative for adenopathy. Does not bruise/bleed easily.  Psychiatric/Behavioral: Positive for dysphoric mood. Negative for suicidal ideas and sleep disturbance. The patient is nervous/anxious.        Objective:   Physical Exam  Constitutional: She is oriented to person, place, and time. She appears well-developed and well-nourished. No distress.  HENT:  Head: Normocephalic and atraumatic.  Right Ear: External ear normal.  Left Ear: External ear normal.  Nose: Nose normal.  Mouth/Throat: Oropharynx is clear and moist. No oropharyngeal exudate.  Eyes: Conjunctivae are normal. Pupils are equal, round, and reactive to light. Right eye exhibits no discharge. Left eye exhibits no discharge. No scleral icterus.  Neck: Normal range of motion. Neck supple. No tracheal deviation present. No thyromegaly present.  Cardiovascular: Normal rate, regular rhythm, normal heart sounds and intact distal pulses.  Exam reveals no gallop and no friction rub.   No murmur heard. Pulmonary/Chest: Effort normal and breath sounds normal. No respiratory distress. She has no wheezes. She has no rales. She exhibits no tenderness.  Musculoskeletal: Normal range of motion. She exhibits no edema and no tenderness.  Lymphadenopathy:    She has no cervical adenopathy.  Neurological: She is alert and oriented to person, place, and time. No cranial nerve deficit. She exhibits normal muscle tone. Coordination normal.  Skin: Skin is warm and dry. No rash noted. She is not diaphoretic. No erythema. No pallor.  Psychiatric: She has a normal mood and affect. Her behavior  is normal. Judgment and thought content normal.          Assessment & Plan:

## 2012-10-26 NOTE — Assessment & Plan Note (Signed)
Wt Readings from Last 3 Encounters:  10/26/12 200 lb 8 oz (90.946 kg)  07/01/12 228 lb 8 oz (103.647 kg)  05/05/12 249 lb (112.946 kg)   Congratulated pt on nearly 50lb weight loss. Encouraged her to continue effort at healthy diet and increased physical activity. Discussed adding back some healthy carbohydrates.

## 2012-10-29 ENCOUNTER — Telehealth: Payer: Self-pay | Admitting: Internal Medicine

## 2012-10-29 NOTE — Telephone Encounter (Signed)
Labs show normal blood counts, kidney function. Liver function was slightly high. Thyroid function showed slightly high TSH but normal T4 and T3. I would recommend leaving medications as they are for now. We should repeat CMP in 1 month.

## 2012-11-02 NOTE — Telephone Encounter (Signed)
LMTCB

## 2012-11-03 NOTE — Telephone Encounter (Signed)
Patient informed of lab results and would like to hold off now on any further testing since her bariatric physician ordered labs to be done. She will call back at a later time after speaking with him to let us know what she plan to do

## 2012-11-30 ENCOUNTER — Ambulatory Visit: Payer: BC Managed Care – PPO | Admitting: Internal Medicine

## 2012-12-31 ENCOUNTER — Ambulatory Visit: Payer: PRIVATE HEALTH INSURANCE | Admitting: Internal Medicine

## 2013-01-06 ENCOUNTER — Ambulatory Visit: Payer: PRIVATE HEALTH INSURANCE | Admitting: Internal Medicine

## 2013-01-28 ENCOUNTER — Encounter: Payer: Self-pay | Admitting: Adult Health

## 2013-01-28 ENCOUNTER — Ambulatory Visit (INDEPENDENT_AMBULATORY_CARE_PROVIDER_SITE_OTHER): Payer: BC Managed Care – PPO | Admitting: Adult Health

## 2013-01-28 VITALS — BP 102/60 | HR 62 | Temp 98.0°F | Resp 12 | Ht 65.0 in | Wt 185.0 lb

## 2013-01-28 DIAGNOSIS — Z1239 Encounter for other screening for malignant neoplasm of breast: Secondary | ICD-10-CM | POA: Insufficient documentation

## 2013-01-28 DIAGNOSIS — Z0000 Encounter for general adult medical examination without abnormal findings: Secondary | ICD-10-CM | POA: Insufficient documentation

## 2013-01-28 NOTE — Assessment & Plan Note (Signed)
Normal physical exam. Patient recently had lab tests with Dr. Smitty Cords. Mammogram 12/2012 and normal. PAP 05/2012 and normal. Form filled out for [facility] for [facility].

## 2013-01-28 NOTE — Progress Notes (Signed)
  Subjective:    Patient ID: KIYANNA BIEGLER, female    DOB: 1966/02/15, 47 y.o.   MRN: [id number]  HPI   Health Maintenance:  Tdap -   Flu shot - 06/2014  PAP - 05/2012 (GYN) Normal - Next PAP based on guideline in 5 years  Mammography - 12/2012 [place] [facility] - Normal - Next 12/2013  Colonoscopy - N/A  Labs - Recently done at Dr. Delila Pereyra  Depression Screen - No sadness, hopelessness or anhedonia  Tobacco Use - Never  Dental Exams - Twice yearly. Next exam 02/2013  Vision Exam - Yearly 03/2013  Exercise - Elliptical 1 hour 5 days a week  Diet - Bariatric Surgery 2013. No problems with diet following surgery    Review of Systems  Constitutional: Negative.   HENT: Negative.   Eyes: Negative.   Respiratory: Negative.   Cardiovascular: Negative.   Gastrointestinal: Negative.   Endocrine: Negative.   Genitourinary: Negative.   Musculoskeletal: Negative.   Skin: Negative.   Allergic/Immunologic: Negative.   Neurological: Negative.   Hematological: Negative.   Psychiatric/Behavioral: Negative.        Objective:   Physical Exam  Constitutional: She is oriented to person, place, and time. She appears well-developed and well-nourished. No distress.  HENT:  Head: Normocephalic and atraumatic.  Right Ear: External ear normal.  Left Ear: External ear normal.  Nose: Nose normal.  Mouth/Throat: Oropharynx is clear and moist.  Eyes: Conjunctivae and EOM are normal. Pupils are equal, round, and reactive to light.  Neck: Normal range of motion. Neck supple. No tracheal deviation present. No thyromegaly present.  Cardiovascular: Normal rate, regular rhythm, normal heart sounds and intact distal pulses.  Exam reveals no gallop.   No murmur heard. Pulmonary/Chest: Effort normal and breath sounds normal. No respiratory distress. She has no wheezes. She has no rales.  Abdominal: Soft. Bowel sounds are normal. She exhibits no distension. There is no tenderness. There  is no rebound and no guarding.  Musculoskeletal: Normal range of motion. She exhibits no edema and no tenderness.  Lymphadenopathy:    She has no cervical adenopathy.  Neurological: She is alert and oriented to person, place, and time. No cranial nerve deficit. Coordination normal.  Skin: Skin is warm and dry. No rash noted. No erythema.  Multiple nevi on back. Recent full body exam by Derm.  Psychiatric: She has a normal mood and affect. Her behavior is normal. Judgment and thought content normal.          Assessment & Plan:

## 2013-01-28 NOTE — Patient Instructions (Addendum)
  You are doing very well. Continue your exercise and healthy eating plan  Your physical exam was normal today.  Return for your annual exam in 1 year.

## 2013-03-14 ENCOUNTER — Telehealth: Payer: Self-pay | Admitting: Internal Medicine

## 2013-03-14 NOTE — Telephone Encounter (Signed)
Labs show slight elevation of liver function tests. I would like to repeat labs in 3 months. Otherwise blood counts, kidney function, vitamin B12, vitamin D were all normal. Pre-albumin was low suggesting protein malnutrition. This can be seen after gastric bypass surgery. Would recommend continued efforts at increased protein intake. We can review in more detail during a visit.

## 2013-03-17 NOTE — Telephone Encounter (Signed)
Left message to call back  

## 2013-03-17 NOTE — Telephone Encounter (Signed)
Patient informed and verbally agreed. Per patient her bariatric doctor ordered all these tests so she will just discuss it further with him.

## 2013-04-20 DIAGNOSIS — E65 Localized adiposity: Secondary | ICD-10-CM | POA: Insufficient documentation

## 2013-06-28 ENCOUNTER — Ambulatory Visit: Payer: Self-pay | Admitting: Podiatry

## 2013-07-01 ENCOUNTER — Encounter: Payer: Self-pay | Admitting: Podiatry

## 2013-07-01 ENCOUNTER — Ambulatory Visit (INDEPENDENT_AMBULATORY_CARE_PROVIDER_SITE_OTHER): Payer: BC Managed Care – PPO | Admitting: Podiatry

## 2013-07-01 ENCOUNTER — Ambulatory Visit (INDEPENDENT_AMBULATORY_CARE_PROVIDER_SITE_OTHER): Payer: BC Managed Care – PPO

## 2013-07-01 VITALS — BP 99/57 | HR 71 | Resp 16 | Ht 65.0 in | Wt 165.0 lb

## 2013-07-01 DIAGNOSIS — M779 Enthesopathy, unspecified: Secondary | ICD-10-CM

## 2013-07-01 DIAGNOSIS — R52 Pain, unspecified: Secondary | ICD-10-CM

## 2013-07-01 DIAGNOSIS — L600 Ingrowing nail: Secondary | ICD-10-CM

## 2013-07-01 NOTE — Progress Notes (Signed)
Subjective:     Patient ID: [name], female   DOB: 07-14-1966, 47 y.o.   MRN: [id number]  HPI patient complains of pain in the right hallux nail lateral border of several months duration. States that she is tried to soak it and trim without relief of symptoms. Also complains of mild numbness in the left foot after intense exercise  Review of Systems  All other systems reviewed and are negative.       Objective:   Physical Exam  Nursing note and vitals reviewed. Constitutional: She appears well-developed and well-nourished.  Cardiovascular: Intact distal pulses.   Musculoskeletal: Normal range of motion.  Skin: Skin is warm.   Patient has incurvated nail bed right hallux lateral border that's painful when pressed. Normal range of motion and mild equinus condition noted bilateral. No forefoot symptoms noted left    Assessment:     Ingrown toenail right hallux lateral border and overuse syndrome left secondary to intense activity    Plan:     H&P performed in conditions discussed. I have recommended correction of the hallux nail and patient wants to have this done and understands risk. I infiltrated 61 Xylocaine Marcaine mixture and remove the lateral border exposed matrix and applied chemical phenol followed by alcohol 3 applications followed by alcohol lavage sterile dressing applied and postop instructions given the patient

## 2013-07-01 NOTE — Progress Notes (Signed)
N HURT L LEFT FOOT UNDER TOES    D 3-4 M O SLOWLY C WORSE A EXERCISING T TIGHT SOCKS   N HURTS L TOENAILS B/L/ GREAT TOE RT HURT D 2-3 M O SLOWLY C WORSE A GROWING OUT T PEDICURES

## 2013-07-01 NOTE — Patient Instructions (Signed)

## 2013-08-15 HISTORY — PX: PANNICULECTOMY: SUR1001

## 2013-09-07 ENCOUNTER — Other Ambulatory Visit: Payer: Self-pay | Admitting: Internal Medicine

## 2013-09-15 HISTORY — PX: LUMBAR SPINE SURGERY: SHX701

## 2013-11-10 ENCOUNTER — Emergency Department: Payer: Self-pay | Admitting: Emergency Medicine

## 2013-11-19 ENCOUNTER — Other Ambulatory Visit: Payer: Self-pay | Admitting: Internal Medicine

## 2013-11-28 ENCOUNTER — Encounter (INDEPENDENT_AMBULATORY_CARE_PROVIDER_SITE_OTHER): Payer: Self-pay

## 2013-11-28 ENCOUNTER — Encounter: Payer: Self-pay | Admitting: Internal Medicine

## 2013-11-28 ENCOUNTER — Ambulatory Visit (INDEPENDENT_AMBULATORY_CARE_PROVIDER_SITE_OTHER): Payer: BC Managed Care – PPO | Admitting: Internal Medicine

## 2013-11-28 ENCOUNTER — Telehealth: Payer: Self-pay | Admitting: *Deleted

## 2013-11-28 VITALS — BP 102/60 | HR 61 | Temp 97.6°F | Wt 166.0 lb

## 2013-11-28 DIAGNOSIS — F32A Depression, unspecified: Secondary | ICD-10-CM

## 2013-11-28 DIAGNOSIS — S91009A Unspecified open wound, unspecified ankle, initial encounter: Secondary | ICD-10-CM

## 2013-11-28 DIAGNOSIS — M7989 Other specified soft tissue disorders: Secondary | ICD-10-CM

## 2013-11-28 DIAGNOSIS — S81009A Unspecified open wound, unspecified knee, initial encounter: Secondary | ICD-10-CM

## 2013-11-28 DIAGNOSIS — S81801A Unspecified open wound, right lower leg, initial encounter: Secondary | ICD-10-CM | POA: Insufficient documentation

## 2013-11-28 DIAGNOSIS — F329 Major depressive disorder, single episode, unspecified: Secondary | ICD-10-CM

## 2013-11-28 DIAGNOSIS — S81809A Unspecified open wound, unspecified lower leg, initial encounter: Secondary | ICD-10-CM

## 2013-11-28 DIAGNOSIS — F3289 Other specified depressive episodes: Secondary | ICD-10-CM

## 2013-11-28 DIAGNOSIS — L0390 Cellulitis, unspecified: Secondary | ICD-10-CM

## 2013-11-28 MED ORDER — FLUOXETINE HCL 20 MG PO TABS
20.0000 mg | ORAL_TABLET | Freq: Every day | ORAL | Status: DC
Start: 2013-11-28 — End: 2014-03-26

## 2013-11-28 MED ORDER — CLINDAMYCIN HCL 300 MG PO CAPS: 300.0000 mg | ORAL_CAPSULE | Freq: Three times a day (TID) | ORAL | Status: DC

## 2013-11-28 NOTE — Assessment & Plan Note (Signed)
Open wound of the right lower leg with apparent cellulitis. We'll continue clindamycin for another 7 days. Culture sent for MRSA today. Patient will call if any fever, chills, worsening redness or pain. Wound was dressed with Telfa.

## 2013-11-28 NOTE — Telephone Encounter (Signed)
Can you please put the order in for the e-swab

## 2013-11-28 NOTE — Progress Notes (Signed)
Pre visit review using our clinic review tool, if applicable. No additional management support is needed unless otherwise documented below in the visit note. 

## 2013-11-28 NOTE — Progress Notes (Signed)
Subjective:    Patient ID: [name], female    DOB: July 01, 1966, 48 y.o.   MRN: [id number]  HPI 48YO female with h/o obesity s/p gastric bypass presents for follow up.  S/[facility]. Doing well with weight loss. Continues to follow healthy diet. Exercise was limited after recent surgery.  Fell 2/26 on carpeted stairs. Injured right leg. Was seen by Dr. Myra Rudehristopher Smith. Had x[facility] at [facility]. No fractures. Had open wound right anterior leg with surrounding redness. Was treated with Clindamycin x 7 days, has 4 days left. Notes some persistent swelling of right LE and redness at site of wound. No fever, chills. No h/o MRSA.   She notes some increased stress and anxiety recently. She has taken on a foster child. She would like to increase dose on her fluoxetine if possible.    Review of Systems  Constitutional: Negative for fever, chills, appetite change, fatigue and unexpected weight change.  HENT: Negative for congestion, ear pain, sinus pressure, sore throat, trouble swallowing and voice change.   Eyes: Negative for visual disturbance.  Respiratory: Negative for cough, shortness of breath, wheezing and stridor.   Cardiovascular: Positive for leg swelling. Negative for chest pain and palpitations.  Gastrointestinal: Negative for nausea, vomiting, abdominal pain, diarrhea, constipation, blood in stool, abdominal distention and anal bleeding.  Genitourinary: Negative for dysuria and flank pain.  Musculoskeletal: Negative for arthralgias, gait problem, myalgias and neck pain.  Skin: Positive for color change and wound. Negative for rash.  Neurological: Negative for dizziness and headaches.  Hematological: Negative for adenopathy. Does not bruise/bleed easily.  Psychiatric/Behavioral: Negative for suicidal ideas, sleep disturbance and dysphoric mood. The patient is nervous/anxious.        Objective:    BP 102/60  Pulse 61  Temp(Src) 97.6 F (36.4 C)  (Oral)  Wt 166 lb (75.297 kg)  SpO2 98%  LMP 11/11/2013 Physical Exam  Constitutional: She is oriented to person, place, and time. She appears well-developed and well-nourished. No distress.  HENT:  Head: Normocephalic and atraumatic.  Right Ear: External ear normal.  Left Ear: External ear normal.  Nose: Nose normal.  Mouth/Throat: Oropharynx is clear and moist. No oropharyngeal exudate.  Eyes: Conjunctivae are normal. Pupils are equal, round, and reactive to light. Right eye exhibits no discharge. Left eye exhibits no discharge. No scleral icterus.  Neck: Normal range of motion. Neck supple. No tracheal deviation present. No thyromegaly present.  Cardiovascular: Normal rate, regular rhythm, normal heart sounds and intact distal pulses.  Exam reveals no gallop and no friction rub.   No murmur heard. Pulmonary/Chest: Effort normal and breath sounds normal. No accessory muscle usage. Not tachypneic. No respiratory distress. She has no decreased breath sounds. She has no wheezes. She has no rhonchi. She has no rales. She exhibits no tenderness.  Musculoskeletal: Normal range of motion. She exhibits no edema and no tenderness.  Lymphadenopathy:    She has no cervical adenopathy.  Neurological: She is alert and oriented to person, place, and time. No cranial nerve deficit. She exhibits normal muscle tone. Coordination normal.  Skin: Skin is warm and dry. No rash noted. She is not diaphoretic. There is erythema. No pallor.     Psychiatric: She has a normal mood and affect. Her behavior is normal. Judgment and thought content normal.          Assessment & Plan:   Problem List Items Addressed This Visit   Depression with anxiety -  Primary     Recent worsening of symptoms of anxiety. Will try increasing fluoxetine to 20 mg daily. Plan followup in 4 weeks or sooner as needed.    Relevant Medications      FLUoxetine (PROZAC) tablet   Open wound of right lower leg with complication      Open wound of the right lower leg with apparent cellulitis. We'll continue clindamycin for another 7 days. Culture sent for MRSA today. Patient will call if any fever, chills, worsening redness or pain. Wound was dressed with Telfa.    Relevant Medications      clindamycin (CLEOCIN) capsule   Swelling of right lower extremity     Persistent swelling of the right lower extremity likely secondary to cellulitis but given asymmetry when compared to left lower extremity, will set up ultrasound to evaluate for DVT.    Relevant Orders      Ambulatory referral to Vascular Surgery       Return in about 2 weeks (around 12/12/2013) for Wound recheck.

## 2013-11-28 NOTE — Telephone Encounter (Signed)
Done.  Sorry.

## 2013-11-28 NOTE — Assessment & Plan Note (Signed)
Recent worsening of symptoms of anxiety. Will try increasing fluoxetine to 20 mg daily. Plan followup in 4 weeks or sooner as needed.

## 2013-11-28 NOTE — Assessment & Plan Note (Signed)
Persistent swelling of the right lower extremity likely secondary to cellulitis but given asymmetry when compared to left lower extremity, will set up ultrasound to evaluate for DVT.

## 2013-11-28 NOTE — Patient Instructions (Signed)
Increase Fluoxetine to 20mg  daily.  Continue Clindamycin.  Ultrasound of right leg today or tomorrow.  Follow up for wound check in 1-2 weeks.

## 2013-11-29 NOTE — Addendum Note (Signed)
Addended by: Montine CircleMALDONADO, Cordie Beazley D on: 11/29/2013 08:57 AM   Modules accepted: Orders

## 2013-11-30 ENCOUNTER — Telehealth: Payer: Self-pay | Admitting: *Deleted

## 2013-11-30 ENCOUNTER — Ambulatory Visit: Payer: Self-pay | Admitting: Internal Medicine

## 2013-11-30 NOTE — Telephone Encounter (Signed)
[name] called with call report for patient as noted below: RIGHT LEG DOPPLER  NEGATIVE FOR A DVT HOWEVER 1 FINDING, LYMPH NODE IN UPPER RIGHT THIGH MEASURING 1.7 x 3.1 cm

## 2013-11-30 NOTE — Telephone Encounter (Signed)
OK. Please let pt know that US was negative for DVT. She has infection in her leg, likely causing enlarged lymph node.

## 2013-12-01 ENCOUNTER — Encounter: Payer: Self-pay | Admitting: *Deleted

## 2013-12-01 LAB — MRSA CULTURE

## 2013-12-01 NOTE — Telephone Encounter (Signed)
Left message for pt to return my call.

## 2013-12-02 NOTE — Telephone Encounter (Signed)
Spoke with patient informed of results, she will continue taking the abx. But if problem persist she will call office back to let us know.

## 2013-12-13 ENCOUNTER — Ambulatory Visit (INDEPENDENT_AMBULATORY_CARE_PROVIDER_SITE_OTHER): Payer: BC Managed Care – PPO | Admitting: Internal Medicine

## 2013-12-13 ENCOUNTER — Encounter: Payer: Self-pay | Admitting: Internal Medicine

## 2013-12-13 VITALS — BP 100/60 | HR 69 | Temp 98.2°F | Wt 170.0 lb

## 2013-12-13 DIAGNOSIS — R7989 Other specified abnormal findings of blood chemistry: Secondary | ICD-10-CM | POA: Insufficient documentation

## 2013-12-13 DIAGNOSIS — S81809A Unspecified open wound, unspecified lower leg, initial encounter: Secondary | ICD-10-CM

## 2013-12-13 DIAGNOSIS — R945 Abnormal results of liver function studies: Secondary | ICD-10-CM

## 2013-12-13 DIAGNOSIS — S91009A Unspecified open wound, unspecified ankle, initial encounter: Secondary | ICD-10-CM

## 2013-12-13 DIAGNOSIS — S81801A Unspecified open wound, right lower leg, initial encounter: Secondary | ICD-10-CM

## 2013-12-13 DIAGNOSIS — S81009A Unspecified open wound, unspecified knee, initial encounter: Secondary | ICD-10-CM

## 2013-12-13 NOTE — Assessment & Plan Note (Signed)
Wound appears to be healing well. Will continue MediHoney. Encouraged her to use a compression stocking to help with lower extremity edema. She will call immediately if any worsening redness, pain, swelling, fever, chills. Plan to followup every one to 2 weeks. We discussed potential referral to the wound healing Center but she prefers to hold off for now.

## 2013-12-13 NOTE — Progress Notes (Signed)
Pre visit review using our clinic review tool, if applicable. No additional management support is needed unless otherwise documented below in the visit note. 

## 2013-12-13 NOTE — Progress Notes (Signed)
   Subjective:    Patient ID: Herbert SpiresMelissa C Cass, female    DOB: May 12, 1966, 48 y.o.   MRN: [id number]  HPI 48YO female presents for follow up.  Right LE wound - swelling in the right lower legs has improved. Redness has improved. She stopped taking clindamycin because of stomach upset. She denies any fever or chills. She has been applying Medihoney to her wound daily with some improvement.   Review of Systems  Constitutional: Negative for fever, chills, appetite change and fatigue.  Eyes: Negative for visual disturbance.  Respiratory: Negative for shortness of breath.   Cardiovascular: Positive for leg swelling (trace RLE). Negative for chest pain and palpitations.  Gastrointestinal: Negative for abdominal pain.  Musculoskeletal: Negative for arthralgias, gait problem and myalgias.  Skin: Positive for color change and wound. Negative for rash.       Objective:    BP 100/60  Pulse 69  Temp(Src) 98.2 F (36.8 C) (Oral)  Wt 170 lb (77.111 kg)  SpO2 96%  LMP 11/11/2013 Physical Exam  Constitutional: She is oriented to person, place, and time. She appears well-developed and well-nourished. No distress.  HENT:  Head: Normocephalic and atraumatic.  Right Ear: External ear normal.  Left Ear: External ear normal.  Nose: Nose normal.  Mouth/Throat: Oropharynx is clear and moist.  Eyes: Conjunctivae are normal. Pupils are equal, round, and reactive to light. Right eye exhibits no discharge. Left eye exhibits no discharge. No scleral icterus.  Neck: Normal range of motion. Neck supple. No tracheal deviation present. No thyromegaly present.  Pulmonary/Chest: Effort normal. No accessory muscle usage. Not tachypneic. She has no decreased breath sounds. She has no rhonchi.  Musculoskeletal: Normal range of motion. She exhibits no edema and no tenderness.  Lymphadenopathy:    She has no cervical adenopathy.  Neurological: She is alert and oriented to person, place, and time. No cranial nerve  deficit. She exhibits normal muscle tone. Coordination normal.  Skin: Skin is warm and dry. No rash noted. She is not diaphoretic. No erythema. No pallor.     Psychiatric: She has a normal mood and affect. Her behavior is normal. Judgment and thought content normal.          Assessment & Plan:   Problem List Items Addressed This Visit   Elevated LFTs   Open wound of right lower leg with complication - Primary     Wound appears to be healing well. Will continue MediHoney. Encouraged her to use a compression stocking to help with lower extremity edema. She will call immediately if any worsening redness, pain, swelling, fever, chills. Plan to followup every one to 2 weeks. We discussed potential referral to the wound healing Center but she prefers to hold off for now.        Return in about 2 weeks (around 12/27/2013) for Recheck.

## 2013-12-13 NOTE — Patient Instructions (Signed)
Continue to apply MediHoney to wound on right lower leg.  Call immediately if any fever, worsening redness, pain, or other concerns.  Follow up 2 weeks.

## 2013-12-19 ENCOUNTER — Encounter: Payer: Self-pay | Admitting: *Deleted

## 2013-12-28 ENCOUNTER — Encounter: Payer: Self-pay | Admitting: Internal Medicine

## 2013-12-28 ENCOUNTER — Ambulatory Visit (INDEPENDENT_AMBULATORY_CARE_PROVIDER_SITE_OTHER): Payer: BC Managed Care – PPO | Admitting: Internal Medicine

## 2013-12-28 VITALS — BP 100/60 | HR 54 | Temp 97.8°F | Resp 16 | Wt 168.2 lb

## 2013-12-28 DIAGNOSIS — S81809A Unspecified open wound, unspecified lower leg, initial encounter: Secondary | ICD-10-CM

## 2013-12-28 DIAGNOSIS — M7918 Myalgia, other site: Secondary | ICD-10-CM

## 2013-12-28 DIAGNOSIS — S91009A Unspecified open wound, unspecified ankle, initial encounter: Secondary | ICD-10-CM

## 2013-12-28 DIAGNOSIS — S81801A Unspecified open wound, right lower leg, initial encounter: Secondary | ICD-10-CM

## 2013-12-28 DIAGNOSIS — IMO0001 Reserved for inherently not codable concepts without codable children: Secondary | ICD-10-CM

## 2013-12-28 DIAGNOSIS — S81009A Unspecified open wound, unspecified knee, initial encounter: Secondary | ICD-10-CM

## 2013-12-28 NOTE — Assessment & Plan Note (Signed)
Bilateral gluteal pain after fall. No improvement with chiropractic care. Cannot tolerate NSAIDS because of h/o bariatric surgery. Will set up evaluation with Dr. Yves Dillhasnis. Question if local steroid injection might be helpful.

## 2013-12-28 NOTE — Progress Notes (Signed)
Pre visit review using our clinic review tool, if applicable. No additional management support is needed unless otherwise documented below in the visit note. 

## 2013-12-28 NOTE — Progress Notes (Signed)
Subjective:    Patient ID: [name], female    DOB: [birth year], 48 y.o.   MRN: [id number]  HPI 48YO female presents for follow up.  Wound right leg - Continues to use Medihoney daily. Significant improvement in size and appearance of wound. No pain, fever, chills.  Buttocks pain - Bilateral, ever since fall 2/26. Seen by Dr. Alfredo Bachecil, chiropractor. No improvement. Not taking any medication. No weakness in legs. Pain described as pressure which is worse with prolonged sitting.  Review of Systems  Constitutional: Negative for fever, chills, appetite change, fatigue and unexpected weight change.  HENT: Negative for congestion, ear pain, sinus pressure, sore throat, trouble swallowing and voice change.   Eyes: Negative for visual disturbance.  Respiratory: Negative for cough, shortness of breath, wheezing and stridor.   Cardiovascular: Negative for chest pain, palpitations and leg swelling.  Gastrointestinal: Negative for nausea, vomiting, abdominal pain, diarrhea, constipation, blood in stool, abdominal distention and anal bleeding.  Genitourinary: Negative for dysuria and flank pain.  Musculoskeletal: Positive for arthralgias and myalgias. Negative for gait problem and neck pain.  Skin: Positive for wound. Negative for color change and rash.  Neurological: Negative for dizziness, weakness and headaches.  Hematological: Negative for adenopathy. Does not bruise/bleed easily.  Psychiatric/Behavioral: Negative for suicidal ideas, sleep disturbance and dysphoric mood. The patient is not nervous/anxious.        Objective:    BP 100/60  Pulse 54  Temp(Src) 97.8 F (36.6 C) (Oral)  Resp 16  Wt 168 lb 4 oz (76.318 kg)  SpO2 95%  LMP 11/11/2013 Physical Exam  Constitutional: She is oriented to person, place, and time. She appears well-developed and well-nourished. No distress.  HENT:  Head: Normocephalic and atraumatic.  Right Ear: External ear normal.  Left Ear: External ear  normal.  Nose: Nose normal.  Mouth/Throat: Oropharynx is clear and moist. No oropharyngeal exudate.  Eyes: Conjunctivae are normal. Pupils are equal, round, and reactive to light. Right eye exhibits no discharge. Left eye exhibits no discharge. No scleral icterus.  Neck: Normal range of motion. Neck supple. No tracheal deviation present. No thyromegaly present.  Cardiovascular: Normal rate, regular rhythm, normal heart sounds and intact distal pulses.  Exam reveals no gallop and no friction rub.   No murmur heard. Pulmonary/Chest: Effort normal and breath sounds normal. No accessory muscle usage. Not tachypneic. No respiratory distress. She has no decreased breath sounds. She has no wheezes. She has no rhonchi. She has no rales. She exhibits no tenderness.  Musculoskeletal: Normal range of motion. She exhibits no edema and no tenderness.       Back:  Lymphadenopathy:    She has no cervical adenopathy.  Neurological: She is alert and oriented to person, place, and time. No cranial nerve deficit. She exhibits normal muscle tone. Coordination normal.  Skin: Skin is warm and dry. No rash noted. She is not diaphoretic. No erythema. No pallor.     Psychiatric: She has a normal mood and affect. Her behavior is normal. Judgment and thought content normal.          Assessment & Plan:   Problem List Items Addressed This Visit   Gluteal pain - Primary     Bilateral gluteal pain after fall. No improvement with chiropractic care. Cannot tolerate NSAIDS because of h/o bariatric surgery. Will set up evaluation with Dr. Yves Dillhasnis. Question if local steroid injection might be helpful.    Relevant Orders      Ambulatory referral to Orthopedic  Surgery   Open wound of right lower leg with complication     Significant improvement in wound right LE. Will continue MediHoney. Follow up in 2 months and prn.        Return in about 3 months (around 03/29/2014) for Physical.

## 2013-12-28 NOTE — Assessment & Plan Note (Signed)
Significant improvement in wound right LE. Will continue MediHoney. Follow up in 2 months and prn.

## 2014-01-02 ENCOUNTER — Telehealth: Payer: Self-pay | Admitting: Internal Medicine

## 2014-01-02 MED ORDER — TRAMADOL HCL 50 MG PO TABS: 50.0000 mg | ORAL_TABLET | Freq: Two times a day (BID) | ORAL | Status: DC | PRN

## 2014-01-02 NOTE — Telephone Encounter (Signed)
Patient aware prescription has been called in to the pharmacy per her request.

## 2014-01-02 NOTE — Telephone Encounter (Signed)
She was seen last week for this, please advise.

## 2014-01-02 NOTE — Telephone Encounter (Signed)
OK. She was recently seen for this. She cannot tolerate NSAIDS because of h/o bariatric surgery. We can start Tramadol 50mg  po bid prn pain, #60.

## 2014-01-02 NOTE — Telephone Encounter (Signed)
The patient is needing something for the pain in her buttocks until she can be seen by the pain doctor on 4.29.15.

## 2014-01-03 ENCOUNTER — Telehealth: Payer: Self-pay | Admitting: Internal Medicine

## 2014-01-03 MED ORDER — HYDROCODONE-ACETAMINOPHEN 5-325 MG PO TABS: ORAL_TABLET | ORAL | Status: DC

## 2014-01-03 MED ORDER — HYDROCODONE-ACETAMINOPHEN 5-300 MG PO TABS: ORAL_TABLET | ORAL | Status: DC

## 2014-01-03 NOTE — Telephone Encounter (Signed)
Please read below and advise.

## 2014-01-03 NOTE — Telephone Encounter (Signed)
We can try a short supply of Hydrocodone 5-325mg  po tid prn #30. She should be very cautious about using this though because of risk of sedation.

## 2014-01-03 NOTE — Telephone Encounter (Signed)
The Tramadol is not touching the pain that the patient is having . Please advise.

## 2014-01-03 NOTE — Telephone Encounter (Signed)
Patient informed and will come by the office to pick up.

## 2014-01-19 ENCOUNTER — Encounter: Payer: Self-pay | Admitting: Internal Medicine

## 2014-01-24 ENCOUNTER — Ambulatory Visit: Payer: Self-pay | Admitting: Physical Medicine and Rehabilitation

## 2014-01-30 DIAGNOSIS — M51369 Other intervertebral disc degeneration, lumbar region without mention of lumbar back pain or lower extremity pain: Secondary | ICD-10-CM | POA: Insufficient documentation

## 2014-01-30 DIAGNOSIS — M5136 Other intervertebral disc degeneration, lumbar region: Secondary | ICD-10-CM | POA: Insufficient documentation

## 2014-01-30 DIAGNOSIS — M48062 Spinal stenosis, lumbar region with neurogenic claudication: Secondary | ICD-10-CM | POA: Insufficient documentation

## 2014-03-26 ENCOUNTER — Other Ambulatory Visit: Payer: Self-pay | Admitting: Internal Medicine

## 2014-04-04 ENCOUNTER — Encounter: Payer: BC Managed Care – PPO | Admitting: Internal Medicine

## 2014-05-05 ENCOUNTER — Encounter: Payer: Self-pay | Admitting: Internal Medicine

## 2014-05-05 ENCOUNTER — Ambulatory Visit (INDEPENDENT_AMBULATORY_CARE_PROVIDER_SITE_OTHER): Payer: BC Managed Care – PPO | Admitting: Internal Medicine

## 2014-05-05 VITALS — BP 100/60 | HR 61 | Temp 97.6°F | Ht 65.0 in | Wt 170.2 lb

## 2014-05-05 DIAGNOSIS — IMO0001 Reserved for inherently not codable concepts without codable children: Secondary | ICD-10-CM

## 2014-05-05 DIAGNOSIS — L819 Disorder of pigmentation, unspecified: Secondary | ICD-10-CM | POA: Insufficient documentation

## 2014-05-05 DIAGNOSIS — M7918 Myalgia, other site: Secondary | ICD-10-CM

## 2014-05-05 DIAGNOSIS — Z0000 Encounter for general adult medical examination without abnormal findings: Secondary | ICD-10-CM

## 2014-05-05 DIAGNOSIS — B079 Viral wart, unspecified: Secondary | ICD-10-CM

## 2014-05-05 MED ORDER — FLUOXETINE HCL 20 MG PO TABS: 20.0000 mg | ORAL_TABLET | Freq: Every day | ORAL | Status: DC

## 2014-05-05 MED ORDER — SALICYLIC ACID 17 % EX GEL: Freq: Every day | CUTANEOUS | Status: DC

## 2014-05-05 NOTE — Assessment & Plan Note (Signed)
Discussed using Compound W. Follow up prn.

## 2014-05-05 NOTE — Progress Notes (Signed)
Pre visit review using our clinic review tool, if applicable. No additional management support is needed unless otherwise documented below in the visit note. 

## 2014-05-05 NOTE — Assessment & Plan Note (Signed)
Will set up dermatology evaluation. We discussed that this hyperpigmentation at previous scar site may not improve.

## 2014-05-05 NOTE — Assessment & Plan Note (Signed)
General medical exam normal today including breast exam. PAP and pelvic deferred as pt reports UTD 2013. Will request records from last PAP. Mammogram ordered through her work. Flu vaccine through her work. Labs ordered including CBC, CMP, lipids, A1c. Follow up 6 months and prn.

## 2014-05-05 NOTE — Progress Notes (Signed)
Subjective:    Patient ID: [name], female    DOB: Jun 22, 1966, 48 y.o.   MRN: [id number]  HPI 48YO female presents for annual exam.  Continues to have lower back and gluteal pain. Was seen by Dr. Yves Dill and Dr. Lovell Sheehan. Has had 3 MRIs for evaluation. Taking up to 8 Vicodin per day. Was also seen by Dr. Charlett Lango at [facility]. He stopped Vicodin and started Oxycodone and Lyrica with PT. Follow up scheduled in 1 month. Last MRI on Wednesday.  Exercise limited because of pain. Continues to follow healthy diet.  Review of Systems  Constitutional: Negative for fever, chills, appetite change, fatigue and unexpected weight change.  Eyes: Negative for visual disturbance.  Respiratory: Negative for shortness of breath.   Cardiovascular: Negative for chest pain and leg swelling.  Gastrointestinal: Negative for nausea, vomiting, abdominal pain, diarrhea, constipation and blood in stool.  Musculoskeletal: Positive for arthralgias, joint swelling and myalgias.  Skin: Negative for color change and rash.  Neurological: Negative for weakness.  Hematological: Negative for adenopathy. Does not bruise/bleed easily.  Psychiatric/Behavioral: Negative for dysphoric mood. The patient is not nervous/anxious.        Objective:    BP 100/60  Pulse 61  Temp(Src) 97.6 F (36.4 C) (Oral)  Ht 5\' 5"  (1.651 m)  Wt 170 lb 4 oz (77.225 kg)  BMI 28.33 kg/m2  SpO2 97%  LMP 03/05/2014 Physical Exam  Constitutional: She is oriented to person, place, and time. She appears well-developed and well-nourished. No distress.  HENT:  Head: Normocephalic and atraumatic.  Right Ear: External ear normal.  Left Ear: External ear normal.  Nose: Nose normal.  Mouth/Throat: Oropharynx is clear and moist. No oropharyngeal exudate.  Eyes: Conjunctivae are normal. Pupils are equal, round, and reactive to light. Right eye exhibits no discharge. Left eye exhibits no discharge. No scleral icterus.  Neck: Normal range  of motion. Neck supple. No tracheal deviation present. No thyromegaly present.  Cardiovascular: Normal rate, regular rhythm, normal heart sounds and intact distal pulses.  Exam reveals no gallop and no friction rub.   No murmur heard. Pulmonary/Chest: Effort normal and breath sounds normal. No accessory muscle usage. Not tachypneic. No respiratory distress. She has no decreased breath sounds. She has no wheezes. She has no rhonchi. She has no rales. She exhibits no tenderness. Right breast exhibits no inverted nipple, no mass, no nipple discharge, no skin change and no tenderness. Left breast exhibits no inverted nipple, no mass, no nipple discharge, no skin change and no tenderness. Breasts are symmetrical.  Abdominal: Soft. Normal appearance and bowel sounds are normal. She exhibits no distension and no mass. There is no tenderness. There is no rebound and no guarding.    Musculoskeletal: Normal range of motion. She exhibits no edema and no tenderness.  Lymphadenopathy:    She has no cervical adenopathy.  Neurological: She is alert and oriented to person, place, and time. No cranial nerve deficit. She exhibits normal muscle tone. Coordination normal.  Skin: Skin is warm and dry. No rash noted. She is not diaphoretic. No erythema. No pallor.     Psychiatric: She has a normal mood and affect. Her behavior is normal. Judgment and thought content normal.          Assessment & Plan:   Problem List Items Addressed This Visit     Unprioritized   Gluteal pain     Will request recent notes from Dr. Lovell Sheehan and Ronie Spies. Continue Oxycodone and  Lyrica as prescribed by Ortho.    Hyperpigmentation     Will set up dermatology evaluation. We discussed that this hyperpigmentation at previous scar site may not improve.    Relevant Orders      Ambulatory referral to Dermatology   Routine general medical examination at health care facility - Primary     General medical exam normal today  including breast exam. PAP and pelvic deferred as pt reports UTD 2013. Will request records from last PAP. Mammogram ordered through her work. Flu vaccine through her work. Labs ordered including CBC, CMP, lipids, A1c. Follow up 6 months and prn.    Viral wart on finger     Discussed using Compound W. Follow up prn.    Relevant Medications      salicylic acid gel 17%       Return in about 6 months (around 11/05/2014) for Recheck.

## 2014-05-05 NOTE — Assessment & Plan Note (Signed)
Will request recent notes from Dr. Jenkins [facility]ovell [facility]ehand [facility]. Continue Oxycodone and Lyrica as prescribed by Ortho.

## 2014-05-05 NOTE — Patient Instructions (Signed)

## 2014-05-09 LAB — BASIC METABOLIC PANEL
BUN: 23 mg/dL — AB (ref 4–21)
Creatinine: 0.6 mg/dL (ref 0.5–1.1)
Glucose: 76 mg/dL
POTASSIUM: 3.9 mmol/L (ref 3.4–5.3)
SODIUM: 142 mmol/L (ref 137–147)

## 2014-05-09 LAB — LIPID PANEL
CHOLESTEROL: 106 mg/dL (ref 0–200)
HDL: 58 mg/dL (ref 35–70)
LDL Cholesterol: 40 mg/dL
Triglycerides: 39 mg/dL — AB (ref 40–160)

## 2014-05-09 LAB — HEMOGLOBIN A1C: HEMOGLOBIN A1C: 5.3 % (ref 4.0–6.0)

## 2014-05-09 LAB — CBC AND DIFFERENTIAL
HCT: 39 % (ref 36–46)
HEMOGLOBIN: 12.4 g/dL (ref 12.0–16.0)
Neutrophils Absolute: 3 /uL
Platelets: 168 10*3/uL (ref 150–399)
WBC: 5.2 10*3/mL

## 2014-05-09 LAB — HEPATIC FUNCTION PANEL
ALT: 92 U/L — AB (ref 7–35)
AST: 70 U/L — AB (ref 13–35)
Alkaline Phosphatase: 77 U/L (ref 25–125)
Bilirubin, Total: 0.3 mg/dL

## 2014-05-09 LAB — TSH: TSH: 2.4 u[IU]/mL (ref 0.41–5.90)

## 2014-05-10 ENCOUNTER — Telehealth: Payer: Self-pay | Admitting: Internal Medicine

## 2014-05-10 NOTE — Telephone Encounter (Signed)
Labs from 8/24 showed elevated liver function tests. I would recommend that we get an ultrasound of the liver. However, her bariatric physician may also be planning to follow this up.

## 2014-05-10 NOTE — Telephone Encounter (Signed)
Left message, notifying of results and to call back if needing liver ultrasound ordered by Dr. Dan Humphreys

## 2014-05-12 ENCOUNTER — Encounter: Payer: Self-pay | Admitting: Internal Medicine

## 2014-05-12 NOTE — Telephone Encounter (Signed)
Left message for pt to return my call.

## 2014-05-15 NOTE — Telephone Encounter (Signed)
Pt notified, will follow up with bariatric doctor, has appointment scheduled tomorrow.

## 2014-09-24 ENCOUNTER — Other Ambulatory Visit: Payer: Self-pay | Admitting: Internal Medicine

## 2014-10-24 ENCOUNTER — Ambulatory Visit: Payer: Self-pay | Admitting: Nurse Practitioner

## 2014-10-27 ENCOUNTER — Encounter: Payer: Self-pay | Admitting: Internal Medicine

## 2014-10-27 ENCOUNTER — Ambulatory Visit (INDEPENDENT_AMBULATORY_CARE_PROVIDER_SITE_OTHER): Payer: BLUE CROSS/BLUE SHIELD | Admitting: Internal Medicine

## 2014-10-27 ENCOUNTER — Ambulatory Visit (INDEPENDENT_AMBULATORY_CARE_PROVIDER_SITE_OTHER)
Admission: RE | Admit: 2014-10-27 | Discharge: 2014-10-27 | Disposition: A | Discharge status: Home / Self Care | Payer: BLUE CROSS/BLUE SHIELD | Source: Ambulatory Visit | Attending: Internal Medicine | Admitting: Internal Medicine

## 2014-10-27 VITALS — BP 112/60 | HR 78 | Temp 98.1°F | Resp 14 | Ht 65.0 in | Wt 180.0 lb

## 2014-10-27 DIAGNOSIS — M545 Low back pain, unspecified: Secondary | ICD-10-CM | POA: Insufficient documentation

## 2014-10-27 MED ORDER — HYDROCODONE-ACETAMINOPHEN 5-325 MG PO TABS: 1.0000 | ORAL_TABLET | Freq: Three times a day (TID) | ORAL | Status: DC | PRN

## 2014-10-27 NOTE — Patient Instructions (Addendum)
[facility] of lower [facility] at [facility].  Use Hydrocodone for pain as needed.  Follow up in 1 week.

## 2014-10-27 NOTE — Progress Notes (Signed)
Pre visit review using our clinic review tool, if applicable. No additional management support is needed unless otherwise documented below in the visit note. 

## 2014-10-27 NOTE — Progress Notes (Signed)
   Subjective:    Patient ID: Cheryl SpiresMelissa C Becker, female    DOB: Nov 26, 1965, 49 y.o.   MRN: [id number]  HPI  49YO female presents for acute visit.  Had lumbar spine surgery in 07/2014. 2 weeks ago, tripped and fell, twisting her back. Pain in lower back sine that times. Mostly on right side. No weakness or numbness noted. Made worse by staying in one position. Tried heat and Aleve with no improvement. Did not seek care after fall.  Past medical, surgical, family and social history per today's encounter.  Review of Systems  Constitutional: Negative for fever, chills, appetite change, fatigue and unexpected weight change.  Eyes: Negative for visual disturbance.  Respiratory: Negative for shortness of breath.   Cardiovascular: Negative for chest pain and leg swelling.  Gastrointestinal: Negative for nausea, abdominal pain and diarrhea.  Musculoskeletal: Positive for myalgias, back pain and arthralgias.  Skin: Negative for color change and rash.  Neurological: Negative for weakness and numbness.  Hematological: Negative for adenopathy. Does not bruise/bleed easily.  Psychiatric/Behavioral: Negative for dysphoric mood. The patient is not nervous/anxious.        Objective:    BP 112/60 mmHg  Pulse 78  Temp(Src) 98.1 F (36.7 C) (Oral)  Resp 14  Ht 5\' 5"  (1.651 m)  Wt 180 lb (81.647 kg)  BMI 29.95 kg/m2  SpO2 98% Physical Exam  Constitutional: She is oriented to person, place, and time. She appears well-developed and well-nourished. No distress.  HENT:  Head: Normocephalic and atraumatic.  Right Ear: External ear normal.  Left Ear: External ear normal.  Nose: Nose normal.  Mouth/Throat: Oropharynx is clear and moist.  Eyes: Conjunctivae are normal. Pupils are equal, round, and reactive to light. Right eye exhibits no discharge. Left eye exhibits no discharge. No scleral icterus.  Neck: Normal range of motion. Neck supple. No tracheal deviation present. No thyromegaly present.    Pulmonary/Chest: Effort normal.  Musculoskeletal: Normal range of motion. She exhibits no edema.       Lumbar back: She exhibits tenderness and pain. She exhibits normal range of motion.       Back:  Lymphadenopathy:    She has no cervical adenopathy.  Neurological: She is alert and oriented to person, place, and time. No cranial nerve deficit. She exhibits normal muscle tone. Coordination normal.  Skin: Skin is warm and dry. No rash noted. She is not diaphoretic. No erythema. No pallor.  Psychiatric: She has a normal mood and affect. Her behavior is normal. Judgment and thought content normal.          Assessment & Plan:   Problem List Items Addressed This Visit      Unprioritized   Right-sided low back pain without sciatica - Primary    Severe low back pain after twisting fall. Recent surgery lumbar spine with [facility]. Will get plain xray lumbar spine. Discussed getting MRI for additional evaluation if symptoms persist. Continue Aleve and add prn Hydrocodone for severe pain. Follow up in 1-2 weeks.      Relevant Medications   HYDROcodone-acetaminophen (NORCO/VICODIN) 5-325 MG per tablet   Other Relevant Orders   DG Lumbar Spine Complete       Return in about 1 week (around 11/03/2014).

## 2014-10-27 NOTE — Assessment & Plan Note (Signed)
Severe low back pain after twisting fall. Recent surgery lumbar spine with [facility]. Will get plain xray lumbar spine. Discussed getting MRI for additional evaluation if symptoms persist. Continue Aleve and add prn Hydrocodone for severe pain. Follow up in 1-2 weeks.

## 2014-11-09 ENCOUNTER — Ambulatory Visit: Payer: BC Managed Care – PPO | Admitting: Internal Medicine

## 2014-11-21 ENCOUNTER — Telehealth: Payer: Self-pay | Admitting: *Deleted

## 2014-11-21 ENCOUNTER — Telehealth: Payer: Self-pay | Admitting: Internal Medicine

## 2014-11-21 NOTE — Telephone Encounter (Signed)
Notified pt. 

## 2014-11-21 NOTE — Telephone Encounter (Signed)
Pt scheduled for Thursday at 3:30 with Dr Dan HumphreysWalker.

## 2014-11-21 NOTE — Telephone Encounter (Signed)
No. Needs to be seen first. 

## 2014-11-21 NOTE — Telephone Encounter (Signed)
Either I can see her Thursday at 3:30pm or Lyla SonCarrie can see her tomorrow at 11:30 or 3:30pm

## 2014-11-21 NOTE — Telephone Encounter (Signed)
Pt was seen 10/27/14 for back pain, was given HYDROcodone-acetaminophen (NORCO/VICODIN) 5-325 #60,   No Showed for follow up appt that was scheduled for 11/09/14.  Pt is scheduled to be seen on 11/23/14 for back pain, However Pt is requesting refill on pain medication prior to being seen.

## 2014-11-21 NOTE — Telephone Encounter (Signed)
Patient Name: Cheryl StallMELISSA Anaya DOB: 12-05-1965 Initial Comment Caller states her back pain has not improved, wants to know if she can get rx or schedule test other than x-ray Nurse Assessment Nurse: Charna Elizabethrumbull, RN, Lynden Angathy Date/Time (Eastern Time): 11/21/2014 11:17:34 AM Confirm and document reason for call. If symptomatic, describe symptoms. ---Caller states she developed lower back pain about a month ago and then pain continues. No new injury in the past 3 days. No fever. Has the patient traveled out of the country within the last 30 days? ---No Does the patient require triage? ---Yes Related visit to physician within the last 2 weeks? ---Yes Does the PT have any chronic conditions? (i.e. diabetes, asthma, etc.) ---Yes List chronic conditions. ---Back pain Did the patient indicate they were pregnant? ---No Guidelines Guideline Title Affirmed Question Affirmed Notes Back Pain [1] Pain radiates into the thigh or further down the leg AND [2] both legs Final Disposition User See Physician within 4 Hours (or PCP triage) Charna Elizabethrumbull, RN, Lynden Angathy Comments Amillion only wants to see Dr. Dan HumphreysWalker or Naomie Deanarrie Doss. No appointments open at this time. If she can't see one of them today, she asks if they can call in an Rx for pain medication or schedule further testing. Dorann Oualled Carolyn via the office backline and notified her.

## 2014-11-22 ENCOUNTER — Encounter: Payer: Self-pay | Admitting: Internal Medicine

## 2014-11-22 ENCOUNTER — Ambulatory Visit (INDEPENDENT_AMBULATORY_CARE_PROVIDER_SITE_OTHER): Payer: BLUE CROSS/BLUE SHIELD | Admitting: Internal Medicine

## 2014-11-22 VITALS — BP 102/62 | HR 67 | Temp 97.8°F | Ht 65.0 in | Wt 180.5 lb

## 2014-11-22 DIAGNOSIS — M545 Low back pain, unspecified: Secondary | ICD-10-CM

## 2014-11-22 MED ORDER — GABAPENTIN 100 MG PO CAPS: 100.0000 mg | ORAL_CAPSULE | Freq: Three times a day (TID) | ORAL | Status: DC

## 2014-11-22 MED ORDER — HYDROCODONE-ACETAMINOPHEN 5-325 MG PO TABS: 1.0000 | ORAL_TABLET | Freq: Three times a day (TID) | ORAL | Status: DC | PRN

## 2014-11-22 NOTE — Assessment & Plan Note (Signed)
Persistent right lower back pain after injury in 08/2014. Xray was normal. Given history of discectomy and persistent symptoms, will get MRI lumbar spine for further evaluation. Start Neurontin 100mg  tid. Continue prn Hydrocodone for severe pain. Referral back to spine specialist who performed initial surgery. Follow up 4 weeks.

## 2014-11-22 NOTE — Progress Notes (Signed)
Subjective:    Patient ID: Cheryl SpiresMelissa C Steenson, female    DOB: June 17, 1966, 49 y.o.   MRN: [id number]  HPI  49YO female presents for acute visit.  Last seen 2/12 for right lower back pain. Pain has not improved. Having aching/dull pain in right lower back. Legs are numb at times. No weakness.No LOC of bowel or bladder. S/p lumbar discectomy in 07/2014. Had injury with fall in 09/2014. Having pain since that time.   Past medical, surgical, family and social history per today's encounter.  Review of Systems  Constitutional: Negative for fever, chills, appetite change, fatigue and unexpected weight change.  Eyes: Negative for visual disturbance.  Respiratory: Negative for shortness of breath.   Cardiovascular: Negative for chest pain and leg swelling.  Gastrointestinal: Negative for abdominal pain.  Musculoskeletal: Positive for myalgias, back pain and arthralgias.  Skin: Negative for color change and rash.  Neurological: Positive for numbness. Negative for weakness.  Hematological: Negative for adenopathy. Does not bruise/bleed easily.  Psychiatric/Behavioral: Negative for dysphoric mood. The patient is not nervous/anxious.        Objective:    BP 102/62 mmHg  Pulse 67  Temp(Src) 97.8 F (36.6 C) (Oral)  Ht 5\' 5"  (1.651 m)  Wt 180 lb 8 oz (81.874 kg)  BMI 30.04 kg/m2  SpO2 98%  LMP 11/02/2014 Physical Exam  Constitutional: She is oriented to person, place, and time. She appears well-developed and well-nourished. No distress.  HENT:  Head: Normocephalic and atraumatic.  Right Ear: External ear normal.  Left Ear: External ear normal.  Nose: Nose normal.  Mouth/Throat: Oropharynx is clear and moist.  Eyes: Conjunctivae are normal. Pupils are equal, round, and reactive to light. Right eye exhibits no discharge. Left eye exhibits no discharge. No scleral icterus.  Neck: Normal range of motion. Neck supple. No tracheal deviation present. No thyromegaly present.    Pulmonary/Chest: Effort normal. No accessory muscle usage. No tachypnea. She has no decreased breath sounds. She has no rhonchi.  Musculoskeletal: Normal range of motion. She exhibits no edema.       Lumbar back: She exhibits tenderness and pain. She exhibits normal range of motion, no edema and no deformity.       Back:  Lymphadenopathy:    She has no cervical adenopathy.  Neurological: She is alert and oriented to person, place, and time. She displays no atrophy. No cranial nerve deficit or sensory deficit. She exhibits normal muscle tone. Coordination and gait normal.  Reflex Scores:      Patellar reflexes are 3+ on the right side. Skin: Skin is warm and dry. No rash noted. She is not diaphoretic. No erythema. No pallor.  Psychiatric: She has a normal mood and affect. Her behavior is normal. Judgment and thought content normal.          Assessment & Plan:   Problem List Items Addressed This Visit      Unprioritized   Right-sided low back pain without sciatica - Primary    Persistent right lower back pain after injury in 08/2014. Xray was normal. Given history of discectomy and persistent symptoms, will get MRI lumbar spine for further evaluation. Start Neurontin 100mg  tid. Continue prn Hydrocodone for severe pain. Referral back to spine specialist who performed initial surgery. Follow up 4 weeks.      Relevant Medications   gabapentin (NEURONTIN) capsule   HYDROcodone-acetaminophen (NORCO/VICODIN) 5-325 MG per tablet   Other Relevant Orders   MR Lumbar Spine Wo Contrast   Ambulatory  referral to Orthopedic Surgery       Return in about 4 weeks (around 12/20/2014) for Recheck.

## 2014-11-22 NOTE — Patient Instructions (Signed)
Start Neurontin $RemoveBeforeDEI _[id number]$100mg times daily.  Continue Hydrocodone as needed for severe pain.  We will set up evaluation with Dr. Amalia Greenhouseehmig and MRI of the lumbar spine.

## 2014-11-22 NOTE — Progress Notes (Signed)
Pre visit review using our clinic review tool, if applicable. No additional management support is needed unless otherwise documented below in the visit note. 

## 2014-11-23 ENCOUNTER — Ambulatory Visit: Payer: BLUE CROSS/BLUE SHIELD | Admitting: Internal Medicine

## 2014-11-28 ENCOUNTER — Telehealth: Payer: Self-pay | Admitting: Internal Medicine

## 2014-11-28 NOTE — Telephone Encounter (Signed)
Left msg for pt to call the office about referral to Dr[facility] Iline [facility]venimmig at [facility]. Per [facility] unable to scheduled appointment for referral until patient contacts the office to take care of an issue with her account.msn

## 2014-12-05 ENCOUNTER — Telehealth: Payer: Self-pay | Admitting: *Deleted

## 2014-12-05 NOTE — Telephone Encounter (Signed)
Pt called left message that the Gabapentin nor the Vicodin are touching anything.  She requests a stronger prescription.  Please advise

## 2014-12-06 NOTE — Telephone Encounter (Signed)
Notified pt and scheduled f/u next week, Pt is scheduled for MRI on 12/09/14

## 2014-12-06 NOTE — Telephone Encounter (Signed)
She will need to be seen for this. If pain  Not improved with narcotic, ie Vicodin, I would recommend that she is seen in the ED asap.

## 2014-12-09 ENCOUNTER — Ambulatory Visit: Payer: Self-pay | Admitting: Internal Medicine

## 2014-12-11 ENCOUNTER — Telehealth: Payer: Self-pay | Admitting: Internal Medicine

## 2014-12-11 NOTE — Telephone Encounter (Signed)
Left vm for pt to return my call.  

## 2014-12-11 NOTE — Telephone Encounter (Signed)
MRI lumbar spine showed new right sided disc protrusion at L5-S1. I would recommend evaluation with neurosurgery. Does she have an appointment?

## 2014-12-12 ENCOUNTER — Encounter (INDEPENDENT_AMBULATORY_CARE_PROVIDER_SITE_OTHER): Payer: Self-pay

## 2014-12-12 ENCOUNTER — Ambulatory Visit (INDEPENDENT_AMBULATORY_CARE_PROVIDER_SITE_OTHER): Payer: BLUE CROSS/BLUE SHIELD | Admitting: Internal Medicine

## 2014-12-12 ENCOUNTER — Encounter: Payer: Self-pay | Admitting: Internal Medicine

## 2014-12-12 VITALS — BP 103/64 | HR 60 | Temp 98.0°F | Ht 65.0 in | Wt 185.5 lb

## 2014-12-12 DIAGNOSIS — M545 Low back pain, unspecified: Secondary | ICD-10-CM

## 2014-12-12 MED ORDER — HYDROCODONE-ACETAMINOPHEN 5-325 MG PO TABS: 1.0000 | ORAL_TABLET | Freq: Three times a day (TID) | ORAL | Status: DC | PRN

## 2014-12-12 MED ORDER — GABAPENTIN 300 MG PO CAPS: 300.0000 mg | ORAL_CAPSULE | Freq: Three times a day (TID) | ORAL | Status: DC

## 2014-12-12 NOTE — Assessment & Plan Note (Signed)
Right sided lower back pain, severe. Reviewed MRI with pt which showed disc extrusion and compression at L5-S1 on the right. Recommended follow up with spine surgeon which has been scheduled. Will increase Neurontin to 300mg  tid with goal of better pain control. Will also continue prn Hydrocodone for severe pain. Follow up in 4 weeks and prn.

## 2014-12-12 NOTE — Progress Notes (Addendum)
   Subjective:    Patient ID: [name], female    DOB: 1966/09/03, 49 y.o.   MRN: [id number]  HPI  49YO female presents for follow up.  Back pain - MRI lumbar spine showed moderate right lateral stenosis at L5-S1. Has evaluation with Dr. Amalia Greenhouseehmig on 4/12. Continues to have severe pain right lower back. Also has some intermittent bilateral lower extremely numbness. No weakness noted. No LOC of bowel or bladder. Some improvement with Hydrocodone.   Past medical, surgical, family and social history per today's encounter.  Review of Systems  Constitutional: Negative for fever, chills, appetite change, fatigue and unexpected weight change.  Eyes: Negative for visual disturbance.  Respiratory: Negative for shortness of breath.   Cardiovascular: Negative for chest pain and leg swelling.  Gastrointestinal: Negative for abdominal pain.  Musculoskeletal: Positive for myalgias and arthralgias.  Skin: Negative for color change and rash.  Neurological: Positive for numbness.  Hematological: Negative for adenopathy. Does not bruise/bleed easily.  Psychiatric/Behavioral: Negative for dysphoric mood. The patient is not nervous/anxious.        Objective:    BP 103/64 mmHg  Pulse 60  Temp(Src) 98 F (36.7 C) (Oral)  Ht 5\' 5"  (1.651 m)  Wt 185 lb 8 oz (84.142 kg)  BMI 30.87 kg/m2  SpO2 100%  LMP 11/02/2014 Physical Exam  Constitutional: She is oriented to person, place, and time. She appears well-developed and well-nourished. No distress.  HENT:  Head: Normocephalic and atraumatic.  Right Ear: External ear normal.  Left Ear: External ear normal.  Nose: Nose normal.  Mouth/Throat: Oropharynx is clear and moist.  Eyes: Conjunctivae are normal. Pupils are equal, round, and reactive to light. Right eye exhibits no discharge. Left eye exhibits no discharge. No scleral icterus.  Neck: Normal range of motion. Neck supple. No tracheal deviation present. No thyromegaly present.    Pulmonary/Chest: Effort normal. No accessory muscle usage. No tachypnea. She has no decreased breath sounds. She has no rhonchi.  Musculoskeletal: Normal range of motion. She exhibits no edema or tenderness.       Lumbar back: She exhibits pain. She exhibits normal range of motion.  Lymphadenopathy:    She has no cervical adenopathy.  Neurological: She is alert and oriented to person, place, and time. No cranial nerve deficit. She exhibits normal muscle tone. Coordination normal.  Skin: Skin is warm and dry. No rash noted. She is not diaphoretic. No erythema. No pallor.  Psychiatric: She has a normal mood and affect. Her behavior is normal. Judgment and thought content normal.          Assessment & Plan:   Problem List Items Addressed This Visit      Unprioritized   Right-sided low back pain without sciatica - Primary    Right sided lower back pain, severe. Reviewed MRI with pt which showed disc extrusion and compression at L5-S1 on the right. Recommended follow up with spine surgeon which has been scheduled. Will increase Neurontin to 300mg  tid with goal of better pain control. Will also continue prn Hydrocodone for severe pain. Follow up in 4 weeks and prn.      Relevant Medications   gabapentin (NEURONTIN) capsule   HYDROcodone-acetaminophen (NORCO/VICODIN) 5-325 MG per tablet       Return in about 4 weeks (around 01/09/2015) for Recheck.

## 2014-12-12 NOTE — Progress Notes (Signed)
Pre visit review using our clinic review tool, if applicable. No additional management support is needed unless otherwise documented below in the visit note. 

## 2014-12-12 NOTE — Patient Instructions (Signed)
Increase Neurontin to 300mg  three times daily.  Continue Hydrocodone as needed for severe pain.  Follow up in 4 weeks.

## 2014-12-12 NOTE — Telephone Encounter (Signed)
Pt has appt today, will discuss then

## 2014-12-17 ENCOUNTER — Encounter: Payer: Self-pay | Admitting: Internal Medicine

## 2014-12-19 ENCOUNTER — Other Ambulatory Visit: Payer: Self-pay | Admitting: *Deleted

## 2014-12-19 ENCOUNTER — Encounter: Payer: Self-pay | Admitting: *Deleted

## 2014-12-19 MED ORDER — TRAMADOL HCL 50 MG PO TABS: 100.0000 mg | ORAL_TABLET | Freq: Two times a day (BID) | ORAL | Status: DC | PRN

## 2015-01-01 ENCOUNTER — Ambulatory Visit: Payer: BLUE CROSS/BLUE SHIELD | Admitting: Internal Medicine

## 2015-01-04 ENCOUNTER — Telehealth: Payer: Self-pay | Admitting: Internal Medicine

## 2015-01-04 NOTE — Telephone Encounter (Signed)
Too early to refill medication

## 2015-01-07 NOTE — Op Note (Signed)
PATIENT NAME:  Cheryl SpiresUCKER, [name] MR#:  [id number] DATE OF BIRTH:  15-Mar-1966  DATE OF PROCEDURE:  03/02/2012  PREOPERATIVE DIAGNOSIS: Morbid obesity, multiple comorbidities.   POSTOPERATIVE DIAGNOSIS: Morbid obesity, multiple comorbidities.   PROCEDURE: Laparoscopic Roux-en-Y gastric bypass.  SURGEON:  Primus BravoJon Renea Schoonmaker, MD  ASSISTANT:  Mariella SaaSarah Stout, PA-Becker  CLINICAL HISTORY:  See History and Physical.  COMPLICATIONS: None.  ESTIMATED BLOOD LOSS: None.  ANESTHESIA: General endotracheal.  DRAINS: #19 Wende NeighborsFrench Blake, left upper quadrant.  DETAILS OF THE PROCEDURE:  The patient was taken to the operating room and placed on the operating table in the supine position.  Appropriate monitors and supplemental oxygen were delivered.  Broad-spectrum IV antibiotics were administered.  Sequential stockings were placed.  The abdomen was prepped and draped in the usual sterile fashion after the smooth induction of general anesthesia.  The abdomen was accessed using a 5-ml optical trocar in the left upper quadrant.  Pneumoperitoneum was established without difficulty.  Multiple other ports were placed in preparation for gastric bypass.  The small bowel was identified at the ligament of Treitz and run for 50 cm distal to that point.  It was then divided using an Echelon white load stapler reinforced with seam guard.  The Harmonic scalpel was used to take down the mesentery to its base.  A 110 cm Roux limb was then created and a side-to-side jejunojejunostomy was created in the following fashion.  A tacking stitch was used using 2-0 Surgidac suture.  An enterotomy was made in both limbs and the Echelon white load stapler was inserted to its hub, clamped, and fired for its full 6-cm length.  The ensuing defect was closed using interrupted sutures to retract the enterotomy up and stapled across with the Echelon blue-load stapler with good effect.  A baby's butt stitch was placed times one using 2-0 Surgidac suture.  A stay  suture was placed anteriorly using a 2-0 Surgidac suture and the mesenteric defect was closed using running 2-0 Surgidac suture.  The omentum was divided then in the midline to create a path for the Roux limb to be lifted and elevated up to the gastric pouch.  The liver retractor was placed and the liver was retracted anteriorly and cephalad.  The patient was placed in steep reverse Trendelenburg after the Roux limb was tacked to the underside of the stomach using a 2-0 Surgidac suture.  All structures were removed from the stomach.  The pars flaccida was taken down using a Harmonic scalpel to the second gastric vein.  At that point the Echelon gold load reinforced with seam guard was used to fire multiple times creating a small 30 to 45-ml pouch.  Hemostasis was excellent.  The posterior pouch was then freed of all fibrofatty tissue in preparation for anastomosis.  The small bowel Roux limb was then mobilized up into the upper abdomen and tacked to the left side of the pouch with an interrupted 2-0 Surgidac suture.  Enterotomies were made in the gastric pouch and in the small bowel and the Echelon blue load stapler was inserted to 28 mm and clamped.  This was then fired and the ensuing defect was closed using a layer of 2-0 Polysorb suture times one.  A 34 French blunt-tipped bougie was inserted trans-orally down past the anastomosis and the second layer of closure occurred with two running 2-0 Polysorb sutures from either side, creating a two-layer anastomosis.  The bougie was removed and the proximal Roux limb was clamped.  This  was submerged underneath saline and endoscope was placed trans-orally and guided all the way down to the anastomosis without difficulty.  The anastomosis was widely patent and hemostasis was excellent.  No leak was present under high-pressure insufflation with the proximal Roux limb clamp.  The endoscopes were removed and I re-gowned and re-gloved.  We took the omentum and draped that  over the top of the anastomosis and sutured it in place using a 2-0 Surgidac suture.  The Peterson's defect was then closed using running 2-0 Surgidac suture. A drain was placed in the left upper quadrant over the anastomosis of 19 [place] Blake.  The liver retractor and trocars were removed without incident.  No bleeding occurred from any of these sites.  The wounds were then closed using 4-0 Vicryl and Dermabond.  The patient tolerated the procedure well, arrived in the recovery room in stable condition, and will be admitted to my care. ____________________________ Primus Bravo, MD jb:slb D: 03/02/2012 14:36:52 ET     T: 03/02/2012 14:50:28 ET        JOB#: [id number] cc: Primus Bravo, MD, <Dictator> Cletis Athens Marjean Donna MD ELECTRONICALLY SIGNED 03/04/2012 8:04

## 2015-01-09 ENCOUNTER — Encounter: Payer: Self-pay | Admitting: Internal Medicine

## 2015-01-14 ENCOUNTER — Encounter: Payer: Self-pay | Admitting: Internal Medicine

## 2015-01-15 ENCOUNTER — Ambulatory Visit: Payer: BLUE CROSS/BLUE SHIELD | Admitting: Internal Medicine

## 2015-01-25 ENCOUNTER — Encounter: Payer: Self-pay | Admitting: Internal Medicine

## 2015-03-12 ENCOUNTER — Other Ambulatory Visit: Payer: Self-pay

## 2015-04-04 ENCOUNTER — Encounter: Payer: Self-pay | Admitting: *Deleted

## 2015-04-04 ENCOUNTER — Emergency Department
Admission: EM | Admit: 2015-04-04 | Discharge: 2015-04-04 | Disposition: A | Discharge status: Home / Self Care | Payer: BLUE CROSS/BLUE SHIELD | Attending: Emergency Medicine | Admitting: Emergency Medicine

## 2015-04-04 DIAGNOSIS — I10 Essential (primary) hypertension: Secondary | ICD-10-CM | POA: Insufficient documentation

## 2015-04-04 DIAGNOSIS — Z880 Allergy status to penicillin: Secondary | ICD-10-CM | POA: Diagnosis not present

## 2015-04-04 DIAGNOSIS — M545 Low back pain, unspecified: Secondary | ICD-10-CM

## 2015-04-04 DIAGNOSIS — G8929 Other chronic pain: Secondary | ICD-10-CM | POA: Diagnosis not present

## 2015-04-04 DIAGNOSIS — Z79899 Other long term (current) drug therapy: Secondary | ICD-10-CM | POA: Insufficient documentation

## 2015-04-04 DIAGNOSIS — E119 Type 2 diabetes mellitus without complications: Secondary | ICD-10-CM | POA: Diagnosis not present

## 2015-04-04 MED ORDER — KETOROLAC TROMETHAMINE 60 MG/2ML IM SOLN
60.0000 mg | Freq: Once | INTRAMUSCULAR | Status: AC
  Administered 2015-04-04: 60 mg via INTRAMUSCULAR
  Filled 2015-04-04: qty 2

## 2015-04-04 MED ORDER — HYDROMORPHONE HCL 1 MG/ML IJ SOLN
1.0000 mg | Freq: Once | INTRAMUSCULAR | Status: AC
  Administered 2015-04-04: 1 mg via INTRAMUSCULAR
  Filled 2015-04-04: qty 1

## 2015-04-04 NOTE — ED Provider Notes (Signed)
[facility] Emergency Department Provider Note  ____________________________________________  Time seen: Approximately 5:35 PM  I have reviewed the triage vital signs and the nursing notes.   HISTORY  Chief Complaint Back Pain   HPI [name] is a 49 y.o. female presents to the emergency room with severe back pain.  She has a history of chronic back pain and is followed by [facility].  Over the last 2-3 days her pain has increased to a 9/10 pain.  She was previously alternating Oxycodone and Vicodin for pain control but is currently taking nothing for the pain.  She has an appointment in August with ortho to discuss surgery for an L4-L5 fusion. She denies urinary incontinence. She is just hoping for something to control the pain until she can call [facility] and follow up.   Past Medical History  Diagnosis Date  . Anxiety and depression   . DM2 (diabetes mellitus, type 2)   . Obesity   . Hypothyroid   . HTN (hypertension)   . HLD (hyperlipidemia)     Patient Active Problem List   Diagnosis Date Noted  . Right-sided low back pain without sciatica 10/27/2014  . Hyperpigmentation 05/05/2014  . Viral wart on finger 05/05/2014  . Gluteal pain 12/28/2013  . Elevated LFTs 12/13/2013  . Routine general medical examination at health care facility 01/28/2013  . Depression with anxiety 10/26/2012  . Anemia 05/05/2012  . HYPOTHYROIDISM 10/28/2007  . HYPERCHOLESTEROLEMIA, PURE 05/21/2007    Past Surgical History  Procedure Laterality Date  . Cholecystectomy  6/03  . Dilation and curettage of uterus  1/05  . Nasal sinus surgery    . Knee surgery      L - arthroscopic  . Sleep study - apnea  8/02  . Abd [place] - gallstones  5/03  . Liver biopsy  6/03    fatty liver  . Back surgery  2009    decompressive laminectomy  . Bariatric surgery  03/02/12    gastric bypass  . Panniculectomy  08/2013    [facility], Dr. Diamond Nickel  . Lumbar spine surgery   2015    Dr. Amalia Greenhouse    Current Outpatient Rx  Name  Route  Sig  Dispense  Refill  . Biotin (BIOTIN 5000) 5 MG CAPS   Oral   Take by mouth daily.         . cholecalciferol (VITAMIN D) 1000 UNITS tablet   Oral   Take 1,000 Units by mouth daily.         Marland Kitchen FLUoxetine (PROZAC) 20 MG tablet   Oral   Take 1 tablet (20 mg total) by mouth daily.   30 tablet   11   . gabapentin (NEURONTIN) 300 MG capsule   Oral   Take 1 capsule (300 mg total) by mouth 3 (three) times daily.   90 capsule   3   . HYDROcodone-acetaminophen (NORCO/VICODIN) 5-325 MG per tablet   Oral   Take 1-2 tablets by mouth 3 (three) times daily as needed for moderate pain.   90 tablet   0   . IRON, FERROUS GLUCONATE, PO   Oral   Take 130 mcg by mouth.         . levothyroxine (SYNTHROID, LEVOTHROID) 100 MCG tablet      TAKE 1 TABLET (100 MCG TOTAL) BY MOUTH DAILY.   90 tablet   3   . Multiple Vitamins-Minerals (MULTIVITAL) tablet      Take 4 by  mouth daily         . Omega Fatty Acids-Vitamins (OMEGA-3 GUMMIES PO)      Take 2 by mouth daily         . traMADol (ULTRAM) 50 MG tablet   Oral   Take 2 tablets (100 mg total) by mouth every 12 (twelve) hours as needed.   120 tablet   0   . vitamin B-12 (CYANOCOBALAMIN) 1000 MCG tablet   Oral   Take 1,000 mcg by mouth daily.           Allergies Glipizide; Lyrica; Metformin; and Penicillins  Family History  Problem Relation Age of Onset  . Cancer Father     Social History History  Substance Use Topics  . Smoking status: Never Smoker   . Smokeless tobacco: Never Used     Comment: tobacco use - no  . Alcohol Use: No    Review of Systems Genitourinary: Negative for urinary incontinence. Musculoskeletal: Positive for low back pain. Neurological: Negative for headaches, focal weakness or numbness.  10-point ROS otherwise negative.  ____________________________________________   PHYSICAL EXAM:  VITAL SIGNS: ED Triage Vitals   Enc Vitals Group     BP 04/04/15 1709 121/72 mmHg     Pulse Rate 04/04/15 1709 69     Resp 04/04/15 1709 18     Temp 04/04/15 1709 98.2 F (36.8 C)     Temp Source 04/04/15 1709 Oral     SpO2 04/04/15 1709 100 %     Weight 04/04/15 1709 180 lb (81.647 kg)     Height 04/04/15 1709 5\' 5"  (1.651 m)     Head Cir --      Peak Flow --      Pain Score 04/04/15 1709 10     Pain Loc --      Pain Edu? --      Excl. in GC? --     Constitutional: Alert and oriented. Well appearing and in no acute distress. Cardiovascular: Normal rate, regular rhythm. Grossly normal heart sounds.  Good peripheral circulation. Respiratory: Normal respiratory effort.  No retractions. Lungs CTAB. Gastrointestinal: Soft and nontender. No distention. No abdominal bruits. No CVA tenderness. Musculoskeletal: Pain in lower back with movement. No obvious deformity. Neurologic:  Normal speech and language. No gross focal neurologic deficits are appreciated. No gait instability. Skin:  Skin is warm, dry and intact. No rash noted. Psychiatric: Mood and affect are normal. Speech and behavior are normal.  ____________________________________________   LABS (all labs ordered are listed, but only abnormal results are displayed)  Labs Reviewed - No data to display ____________________________________________  EKG   ____________________________________________  RADIOLOGY   ____________________________________________   PROCEDURES  Procedure(s) performed: None  Critical Care performed: No  ____________________________________________   INITIAL IMPRESSION / ASSESSMENT AND PLAN / ED COURSE  Pertinent labs & imaging results that were available during my care of the patient were reviewed by me and considered in my medical decision making (see chart for details).  Chronic back pain and requests medication refill. After review of the narcotic report advised patient to follow-up with her treating doctor for  her pain medication. ____________________________________________   FINAL CLINICAL IMPRESSION(S) / ED DIAGNOSES  Final diagnoses:  Acute exacerbation of chronic low back pain     Joni ReiningRonald K Lavonya Hoerner, PA-C 04/04/15 1750  Emily FilbertJonathan E Williams, MD 04/05/15 1252

## 2015-04-04 NOTE — Discharge Instructions (Signed)
Advised to follow up with treating Doctor for refill of medication.

## 2015-04-04 NOTE — ED Notes (Signed)
Having lower back pain. Has appt with her md in [place] mid of August.. States she is in severe back pain and out of meds

## 2015-04-04 NOTE — ED Notes (Signed)
Pt has low back pain for 7 months.  No recent injury to back.  Denies urinary sx.

## 2015-04-20 ENCOUNTER — Other Ambulatory Visit: Payer: Self-pay | Admitting: Internal Medicine

## 2015-05-15 ENCOUNTER — Encounter: Payer: Self-pay | Admitting: Internal Medicine

## 2015-05-15 ENCOUNTER — Ambulatory Visit (INDEPENDENT_AMBULATORY_CARE_PROVIDER_SITE_OTHER): Payer: BLUE CROSS/BLUE SHIELD | Admitting: Internal Medicine

## 2015-05-15 VITALS — BP 103/66 | HR 72 | Temp 98.4°F | Ht 65.0 in | Wt 186.0 lb

## 2015-05-15 DIAGNOSIS — R42 Dizziness and giddiness: Secondary | ICD-10-CM

## 2015-05-15 LAB — COMPREHENSIVE METABOLIC PANEL
ALK PHOS: 65 U/L (ref 39–117)
ALT: 102 U/L — AB (ref 0–35)
AST: 59 U/L — ABNORMAL HIGH (ref 0–37)
Albumin: 3.9 g/dL (ref 3.5–5.2)
BILIRUBIN TOTAL: 0.4 mg/dL (ref 0.2–1.2)
BUN: 25 mg/dL — ABNORMAL HIGH (ref 6–23)
CALCIUM: 9.1 mg/dL (ref 8.4–10.5)
CO2: 27 mEq/L (ref 19–32)
Chloride: 104 mEq/L (ref 96–112)
Creatinine, Ser: 0.67 mg/dL (ref 0.40–1.20)
GFR: 99.43 mL/min (ref >60.00)
GLUCOSE: 77 mg/dL (ref 70–99)
Potassium: 4.4 mEq/L (ref 3.5–5.1)
Sodium: 138 mEq/L (ref 135–145)
TOTAL PROTEIN: 6.8 g/dL (ref 6.0–8.3)

## 2015-05-15 LAB — CBC WITH DIFFERENTIAL/PLATELET
BASOS ABS: 0 10*3/uL (ref 0.0–0.1)
Basophils Relative: 0.3 % (ref 0.0–3.0)
Eosinophils Absolute: 0.2 10*3/uL (ref 0.0–0.7)
Eosinophils Relative: 1.7 % (ref 0.0–5.0)
HEMATOCRIT: 39.2 % (ref 36.0–46.0)
HEMOGLOBIN: 12.7 g/dL (ref 12.0–15.0)
LYMPHS PCT: 16.2 % (ref 12.0–46.0)
Lymphs Abs: 1.5 10*3/uL (ref 0.7–4.0)
MCHC: 32.5 g/dL (ref 30.0–36.0)
MCV: 97 fl (ref 78.0–100.0)
MONOS PCT: 5.8 % (ref 3.0–12.0)
Monocytes Absolute: 0.5 10*3/uL (ref 0.1–1.0)
NEUTROS PCT: 76 % (ref 43.0–77.0)
Neutro Abs: 6.8 10*3/uL (ref 1.4–7.7)
Platelets: 185 10*3/uL (ref 150.0–400.0)
RBC: 4.03 Mil/uL (ref 3.87–5.11)
RDW: 14.3 % (ref 11.5–15.5)
WBC: 9 10*3/uL (ref 4.0–10.5)

## 2015-05-15 LAB — TSH: TSH: 1.02 u[IU]/mL (ref 0.35–4.50)

## 2015-05-15 LAB — VITAMIN B12: Vitamin B-12: 574 pg/mL (ref 211–911)

## 2015-05-15 MED ORDER — MECLIZINE HCL 25 MG PO TABS: 25.0000 mg | ORAL_TABLET | Freq: Three times a day (TID) | ORAL | Status: DC | PRN

## 2015-05-15 NOTE — Progress Notes (Signed)
Pre visit review using our clinic review tool, if applicable. No additional management support is needed unless otherwise documented below in the visit note. 

## 2015-05-15 NOTE — Assessment & Plan Note (Signed)
Recent positional vertigo. Likely BPPV. Exam normal. Will start Meclizine.Will set up ENT evaluation. Check labs including CBC, CMP, TSH, B12. Follow up prn.

## 2015-05-15 NOTE — Patient Instructions (Signed)
Labs today.  We will set up an evaluation with ENT.  Start Meclizine  up to three times daily as needed for dizziness.

## 2015-05-15 NOTE — Progress Notes (Signed)
Subjective:    Patient ID: [name], female    DOB: 04-08-1966, 49 y.o.   MRN: [id number]  HPI  49YO female presents for acute visit.  Dizziness - has vertigo with movement for about 1 month. Constant now. Occasional mild headache, but not every day. No fever. No recent injuries. No sinus congestion, ear pain. No NV. Not taking anything for this.  Scheduled for spinal fusion surgery in [place] in September.  Past Medical History  Diagnosis Date  . Anxiety and depression   . DM2 (diabetes mellitus, type 2)   . Obesity   . Hypothyroid   . HTN (hypertension)   . HLD (hyperlipidemia)    Family History  Problem Relation Age of Onset  . Cancer Father    Past Surgical History  Procedure Laterality Date  . Cholecystectomy  6/03  . Dilation and curettage of uterus  1/05  . Nasal sinus surgery    . Knee surgery      L - arthroscopic  . Sleep study - apnea  8/02  . Abd [place] - gallstones  5/03  . Liver biopsy  6/03    fatty liver  . Back surgery  2009    decompressive laminectomy  . Bariatric surgery  03/02/12    gastric bypass  . Panniculectomy  08/2013    [facility], Dr. Diamond Nickel  . Lumbar spine surgery  2015    Dr. Amalia Greenhouse   Social History   Social History  . Marital Status: Single    Spouse Name: N/A  . Number of Children: N/A  . Years of Education: N/A   Social History Main Topics  . Smoking status: Never Smoker   . Smokeless tobacco: Never Used     Comment: tobacco use - no  . Alcohol Use: No  . Drug Use: No  . Sexual Activity: Not Asked   Other Topics Concern  . None   Social History Narrative   Single, no children; works at [employer], does not get regular exercise.     Review of Systems  Constitutional: Negative for fever, chills, appetite change, fatigue and unexpected weight change.  Eyes: Negative for photophobia, pain and visual disturbance.  Respiratory: Negative for shortness of breath.   Cardiovascular: Negative for chest pain and leg  swelling.  Gastrointestinal: Negative for nausea, vomiting, abdominal pain, diarrhea and constipation.  Musculoskeletal: Positive for myalgias, back pain and arthralgias.  Skin: Negative for color change and rash.  Neurological: Positive for dizziness, light-headedness, numbness (chronic) and headaches. Negative for seizures, syncope and weakness.  Hematological: Negative for adenopathy. Does not bruise/bleed easily.  Psychiatric/Behavioral: Negative for sleep disturbance and dysphoric mood. The patient is not nervous/anxious.        Objective:    BP 103/66 mmHg  Pulse 72  Temp(Src) 98.4 F (36.9 C) (Oral)  Ht 5\' 5"  (1.651 m)  Wt 186 lb (84.369 kg)  BMI 30.95 kg/m2  SpO2 98%  LMP 04/06/2015 (Approximate) Physical Exam  Constitutional: She is oriented to person, place, and time. She appears well-developed and well-nourished. No distress.  HENT:  Head: Normocephalic and atraumatic.  Right Ear: External ear normal.  Left Ear: External ear normal.  Nose: Nose normal.  Mouth/Throat: Oropharynx is clear and moist. No oropharyngeal exudate.  Eyes: Conjunctivae are normal. Pupils are equal, round, and reactive to light. Right eye exhibits no discharge. Left eye exhibits no discharge. No scleral icterus.  Neck: Normal range of motion. Neck supple. Carotid bruit is not  present. No tracheal deviation present. No thyroid mass and no thyromegaly present.  Cardiovascular: Normal rate, regular rhythm, normal heart sounds and intact distal pulses.  Exam reveals no gallop and no friction rub.   No murmur heard. Pulmonary/Chest: Effort normal and breath sounds normal. No respiratory distress. She has no wheezes. She has no rales. She exhibits no tenderness.  Musculoskeletal: Normal range of motion. She exhibits no edema or tenderness.  Lymphadenopathy:    She has no cervical adenopathy.  Neurological: She is alert and oriented to person, place, and time. No cranial nerve deficit. She exhibits  normal muscle tone. Coordination normal.  Skin: Skin is warm and dry. No rash noted. She is not diaphoretic. No erythema. No pallor.  Psychiatric: She has a normal mood and affect. Her behavior is normal. Judgment and thought content normal.          Assessment & Plan:   Problem List Items Addressed This Visit      Unprioritized   Dizziness - Primary    Recent positional vertigo. Likely BPPV. Exam normal. Will start Meclizine.Will set up ENT evaluation. Check labs including CBC, CMP, TSH, B12. Follow up prn.      Relevant Medications   meclizine (ANTIVERT) 25 MG tablet   Other Relevant Orders   CBC with Differential/Platelet   Comprehensive metabolic panel   TSH   B12   Ambulatory referral to ENT       Return in about 5 months (around 10/15/2015) for Recheck.

## 2015-05-19 ENCOUNTER — Other Ambulatory Visit: Payer: Self-pay | Admitting: Internal Medicine

## 2015-05-21 ENCOUNTER — Other Ambulatory Visit: Payer: Self-pay | Admitting: Internal Medicine

## 2015-05-24 ENCOUNTER — Telehealth: Payer: Self-pay | Admitting: *Deleted

## 2015-05-24 ENCOUNTER — Other Ambulatory Visit (INDEPENDENT_AMBULATORY_CARE_PROVIDER_SITE_OTHER): Payer: BLUE CROSS/BLUE SHIELD

## 2015-05-24 ENCOUNTER — Telehealth: Payer: Self-pay | Admitting: Internal Medicine

## 2015-05-24 DIAGNOSIS — R7989 Other specified abnormal findings of blood chemistry: Secondary | ICD-10-CM

## 2015-05-24 DIAGNOSIS — R945 Abnormal results of liver function studies: Principal | ICD-10-CM

## 2015-05-24 LAB — COMPREHENSIVE METABOLIC PANEL
ALT: 67 U/L — ABNORMAL HIGH (ref 0–35)
AST: 40 U/L — ABNORMAL HIGH (ref 0–37)
Albumin: 3.8 g/dL (ref 3.5–5.2)
Alkaline Phosphatase: 63 U/L (ref 39–117)
BUN: 22 mg/dL (ref 6–23)
CALCIUM: 9 mg/dL (ref 8.4–10.5)
CHLORIDE: 106 meq/L (ref 96–112)
CO2: 29 meq/L (ref 19–32)
Creatinine, Ser: 0.7 mg/dL (ref 0.40–1.20)
GFR: 94.52 mL/min (ref >60.00)
Glucose, Bld: 57 mg/dL — ABNORMAL LOW (ref 70–99)
POTASSIUM: 4.2 meq/L (ref 3.5–5.1)
Sodium: 141 mEq/L (ref 135–145)
Total Bilirubin: 0.3 mg/dL (ref 0.2–1.2)
Total Protein: 6.5 g/dL (ref 6.0–8.3)

## 2015-05-24 LAB — LIPASE: Lipase: 61 U/L — ABNORMAL HIGH (ref 11.0–59.0)

## 2015-05-24 LAB — TSH: TSH: 1.07 u[IU]/mL (ref 0.35–4.50)

## 2015-05-24 NOTE — Telephone Encounter (Signed)
Kennon Rounds called from [facility] in [place] she needs to know what labs the pt had already done? Pt is in the their office. [phone] ask for [facility]. Thank You!

## 2015-05-24 NOTE — Telephone Encounter (Signed)
Kennon Rounds from [facility] in [place] wanted to get pt lab results from 09.08.2016 faxed: [phone] attention to sally

## 2015-05-24 NOTE — Telephone Encounter (Signed)
Orders placed.

## 2015-05-24 NOTE — Telephone Encounter (Signed)
Eber Jones please call Kennon Rounds ASAP

## 2015-05-24 NOTE — Telephone Encounter (Signed)
Can you please re order those labs to future, and the ammonia and pt.inr cant be done, i only drew the standard tubes

## 2015-05-24 NOTE — Telephone Encounter (Signed)
Labs and dx?  

## 2015-05-25 LAB — ANTI-SMOOTH MUSCLE ANTIBODY, IGG: Smooth Muscle Ab: 7 U (ref <20)

## 2015-05-25 LAB — HEPATITIS A ANTIBODY, IGM: Hep A IgM: NONREACTIVE

## 2015-05-25 LAB — HEPATITIS B CORE ANTIBODY, TOTAL: HEP B C TOTAL AB: NONREACTIVE

## 2015-05-25 LAB — CMV IGM: CMV IgM: <8 AU/mL (ref <30.00)

## 2015-05-25 LAB — HSV(HERPES SIMPLEX VRS) I + II AB-IGG: HSV 1 GLYCOPROTEIN G AB, IGG: 6.95 IV — AB

## 2015-05-25 LAB — ANA: Anti Nuclear Antibody(ANA): NEGATIVE

## 2015-05-25 LAB — HEPATITIS B SURFACE ANTIBODY,QUALITATIVE: Hep B S Ab: NEGATIVE

## 2015-05-25 LAB — IGG, IGA, IGM
IgA: 165 mg/dL (ref 69–380)
IgG (Immunoglobin G), Serum: 1140 mg/dL (ref 690–1700)
IgM, Serum: 175 mg/dL (ref 52–322)

## 2015-05-25 LAB — HIV ANTIBODY (ROUTINE TESTING W REFLEX): HIV: NONREACTIVE

## 2015-05-25 LAB — HEPATITIS C ANTIBODY: HCV AB: NEGATIVE

## 2015-05-25 NOTE — Telephone Encounter (Signed)
Fine to fax recent labs.

## 2015-05-25 NOTE — Telephone Encounter (Signed)
Lab resulted that are results had been faxed to Attention to sally [phone]

## 2015-05-28 ENCOUNTER — Telehealth: Payer: Self-pay | Admitting: *Deleted

## 2015-05-28 LAB — CERULOPLASMIN

## 2015-05-28 NOTE — Telephone Encounter (Signed)
That was the pt that didn't have orders in when they came and i drew only two tubes

## 2015-05-28 NOTE — Telephone Encounter (Signed)
Labs sent by carolyn

## 2015-05-28 NOTE — Telephone Encounter (Signed)
[name] from [facility] called states the Ceruloplasmin could not be ran because there was not enough specimen to runt he test.  Accession #  [id number]

## 2015-05-29 LAB — ANTI-MICROSOMAL ANTIBODY LIVER / KIDNEY

## 2015-09-05 ENCOUNTER — Other Ambulatory Visit: Payer: Self-pay | Admitting: Internal Medicine

## 2015-09-21 ENCOUNTER — Other Ambulatory Visit: Payer: Self-pay | Admitting: Internal Medicine

## 2015-10-15 ENCOUNTER — Ambulatory Visit: Payer: BLUE CROSS/BLUE SHIELD | Admitting: Internal Medicine

## 2015-10-24 ENCOUNTER — Ambulatory Visit: Payer: BLUE CROSS/BLUE SHIELD | Admitting: Internal Medicine

## 2015-12-21 ENCOUNTER — Ambulatory Visit: Payer: BLUE CROSS/BLUE SHIELD | Admitting: Internal Medicine

## 2016-05-07 DIAGNOSIS — M961 Postlaminectomy syndrome, not elsewhere classified: Secondary | ICD-10-CM | POA: Diagnosis not present

## 2016-06-03 ENCOUNTER — Ambulatory Visit (INDEPENDENT_AMBULATORY_CARE_PROVIDER_SITE_OTHER): Payer: BLUE CROSS/BLUE SHIELD | Admitting: Family

## 2016-06-03 ENCOUNTER — Encounter: Payer: Self-pay | Admitting: Family

## 2016-06-03 VITALS — BP 136/74 | HR 89 | Temp 98.2°F | Ht 65.0 in | Wt 204.0 lb

## 2016-06-03 DIAGNOSIS — H9190 Unspecified hearing loss, unspecified ear: Secondary | ICD-10-CM

## 2016-06-03 DIAGNOSIS — J029 Acute pharyngitis, unspecified: Secondary | ICD-10-CM | POA: Diagnosis not present

## 2016-06-03 DIAGNOSIS — H905 Unspecified sensorineural hearing loss: Secondary | ICD-10-CM | POA: Diagnosis not present

## 2016-06-03 LAB — POCT RAPID STREP A (OFFICE): RAPID STREP A SCREEN: NEGATIVE

## 2016-06-03 MED ORDER — AZITHROMYCIN 250 MG PO TABS
ORAL_TABLET | ORAL | 0 refills | Status: DC
Start: 2016-06-03 — End: 2016-06-05

## 2016-06-03 NOTE — Progress Notes (Signed)
Subjective:    Patient ID: Cheryl StainMelissa Carol Becker, female    DOB: 11-18-1965, 50 y.o.   MRN: [id number]  CC: [name] is a 50 y.o. female who presents today for an acute visit.    HPI: Patient here for acute visit for congestion, 'mushy sensation on tongue and in mouth' for one week, worsening. Endorses dry throat, and sore throat.Muffled hearing in left ear for 3 months; no auditory evaluation. No tinnitus, injury.Mouthwash and sensitive toothpaste hurt to take; these are not new products to patient. Occasional productive cough in mornings. Tried sudafed without relief. No HA or sick contacts.   No h/o seasonal allergies.  Due for CPE, Pap. Patient states she will make an appointment after her back pain s/p surgery settles down.       HISTORY:  Past Medical History:  Diagnosis Date  . Anxiety and depression   . DM2 (diabetes mellitus, type 2) (HCC)   . HLD (hyperlipidemia)   . HTN (hypertension)   . Hypothyroid   . Obesity    Past Surgical History:  Procedure Laterality Date  . Abd US - gallstones  5/03  . BACK SURGERY  2009   decompressive laminectomy  . BARIATRIC SURGERY  03/02/12   gastric bypass  . CHOLECYSTECTOMY  6/03  . DILATION AND CURETTAGE OF UTERUS  1/05  . KNEE SURGERY     L - arthroscopic  . LIVER BIOPSY  6/03   fatty liver  . LUMBAR SPINE SURGERY  2015   Dr. Amalia Greenhouseehmig  . NASAL SINUS SURGERY    . PANNICULECTOMY  08/2013   [facility], Dr. Diamond NickelErdman  . sleep study - apnea  8/02   Family History  Problem Relation Age of Onset  . Cancer Father     Allergies: Glipizide; Lyrica [pregabalin]; Metformin; and Penicillins Current Outpatient Prescriptions on File Prior to Visit  Medication Sig Dispense Refill  . Biotin (BIOTIN 5000) 5 MG CAPS Take by mouth daily.    . cholecalciferol (VITAMIN D) 1000 UNITS tablet Take 1,000 Units by mouth daily.    Marland Kitchen. FLUoxetine (PROZAC) 20 MG tablet TAKE 1 TABLET (20 MG TOTAL) BY MOUTH DAILY. 30 tablet 11  . gabapentin  (NEURONTIN) 300 MG capsule TAKE 1 CAPSULE (300 MG TOTAL) BY MOUTH 3 (THREE) TIMES DAILY. 90 capsule 0  . HYDROcodone-acetaminophen (NORCO/VICODIN) 5-325 MG per tablet Take 1-2 tablets by mouth 3 (three) times daily as needed for moderate pain. 90 tablet 0  . IRON, FERROUS GLUCONATE, PO Take 130 mcg by mouth.    . levothyroxine (SYNTHROID, LEVOTHROID) 100 MCG tablet TAKE 1 TABLET (100 MCG TOTAL) BY MOUTH DAILY. 90 tablet 3  . meclizine (ANTIVERT) 25 MG tablet Take 1 tablet (25 mg total) by mouth 3 (three) times daily as needed for dizziness. 30 tablet 0  . Multiple Vitamins-Minerals (MULTIVITAL) tablet Take 4 by mouth daily    . Omega Fatty Acids-Vitamins (OMEGA-3 GUMMIES PO) Take 2 by mouth daily    . vitamin B-12 (CYANOCOBALAMIN) 1000 MCG tablet Take 1,000 mcg by mouth daily.     No current facility-administered medications on file prior to visit.     Social History  Substance Use Topics  . Smoking status: Never Smoker  . Smokeless tobacco: Never Used     Comment: tobacco use - no  . Alcohol use No    Review of Systems  Constitutional: Negative for chills and fever.  HENT: Positive for congestion, hearing loss, sore throat, trouble swallowing and voice change.  Negative for ear discharge, ear pain, mouth sores, sinus pressure and tinnitus.   Eyes: Negative for visual disturbance.  Respiratory: Positive for cough. Negative for shortness of breath and wheezing.   Cardiovascular: Negative for chest pain and palpitations.  Gastrointestinal: Negative for nausea and vomiting.      Objective:    BP 136/74   Pulse 89   Temp 98.2 F (36.8 C)   Ht 5\' 5"  (1.651 m)   Wt 204 lb (92.5 kg)   SpO2 98%   BMI 33.95 kg/m    Physical Exam  Constitutional: She appears well-developed and well-nourished.  HENT:  Head: Normocephalic and atraumatic.  Right Ear: Hearing, tympanic membrane, external ear and ear canal normal. No drainage, swelling or tenderness. No foreign bodies. Tympanic membrane  is not erythematous and not bulging. No middle ear effusion. No decreased hearing is noted.  Left Ear: Hearing, tympanic membrane, external ear and ear canal normal. No drainage, swelling or tenderness. No foreign bodies. Tympanic membrane is not erythematous and not bulging.  No middle ear effusion. No decreased hearing is noted.  Nose: Nose normal. No rhinorrhea. Right sinus exhibits no maxillary sinus tenderness and no frontal sinus tenderness. Left sinus exhibits no maxillary sinus tenderness and no frontal sinus tenderness.  Mouth/Throat: Uvula is midline, oropharynx is clear and moist and mucous membranes are normal. Mucous membranes are not dry. No oral lesions. No dental abscesses. No oropharyngeal exudate, posterior oropharyngeal edema, posterior oropharyngeal erythema or tonsillar abscesses.  No oral lesions, discoloration, or erythema. Moist membranes.   Eyes: Conjunctivae are normal.  Cardiovascular: Regular rhythm, normal heart sounds and normal pulses.   Pulmonary/Chest: Effort normal and breath sounds normal. She has no wheezes. She has no rhonchi. She has no rales.  Lymphadenopathy:       Head (right side): No submental, no submandibular, no tonsillar, no preauricular, no posterior auricular and no occipital adenopathy present.       Head (left side): No submental, no submandibular, no tonsillar, no preauricular, no posterior auricular and no occipital adenopathy present.    She has no cervical adenopathy.  Neurological: She is alert.  Whisper test normal for hearing bilaterally.   Skin: Skin is warm and dry.  Psychiatric: She has a normal mood and affect. Her speech is normal and behavior is normal. Thought content normal.  Vitals reviewed.      Assessment & Plan:   1. Sore throat Strep negative. PCN allergic. Is on duration of symptoms, patient I jointly decided to treat with antibiotic. Patient description of "mushy" feeling of tongue and mouth is nonspecific at this time.  Pending throat culture to evaluate. Advised patient to remain vigilant. Return precautions given.  - POCT Rapid Strep A - throat culture  2. Hearing changes  Referral to ENT for further evaluation.    I am having [name] start on azithromycin. I am also having her maintain her Biotin, cholecalciferol, MULTIVITAL, Omega Fatty Acids-Vitamins (OMEGA-3 GUMMIES PO), vitamin B-12, (IRON, FERROUS GLUCONATE, PO), HYDROcodone-acetaminophen, FLUoxetine, meclizine, levothyroxine, and gabapentin.   Meds ordered this encounter  Medications  . azithromycin (ZITHROMAX) 250 MG tablet    Sig: Tale 500 mg PO on day 1, then 250 mg PO q24h x 4 days.    Dispense:  6 tablet    Refill:  0    Order Specific Question:   Supervising Provider    Answer:   Sherlene Shams [[id number]]    Return precautions given.   Risks, benefits, and alternatives  of the medications and treatment plan prescribed today were discussed, and patient expressed understanding.   Education regarding symptom management and diagnosis given to patient on AVS.  Continue to follow with Wynona Dove, MD for routine health maintenance.   [name] and I agreed with plan.   Rennie Plowman, FNP

## 2016-06-03 NOTE — Patient Instructions (Addendum)
Pleasure meeting you today. Please ensure that she make a complete physical exam appointment this fall.   Pharyngitis Pharyngitis is redness, pain, and swelling (inflammation) of your pharynx.  CAUSES  Pharyngitis is usually caused by infection. Most of the time, these infections are from viruses (viral) and are part of a cold. However, sometimes pharyngitis is caused by bacteria (bacterial). Pharyngitis can also be caused by allergies. Viral pharyngitis may be spread from person to person by coughing, sneezing, and personal items or utensils (cups, forks, spoons, toothbrushes). Bacterial pharyngitis may be spread from person to person by more intimate contact, such as kissing.  SIGNS AND SYMPTOMS  Symptoms of pharyngitis include:   Sore throat.   Tiredness (fatigue).   Low-grade fever.   Headache.  Joint pain and muscle aches.  Skin rashes.  Swollen lymph nodes.  Plaque-like film on throat or tonsils (often seen with bacterial pharyngitis). DIAGNOSIS  Your health care provider will ask you questions about your illness and your symptoms. Your medical history, along with a physical exam, is often all that is needed to diagnose pharyngitis. Sometimes, a rapid strep test is done. Other lab tests may also be done, depending on the suspected cause.  TREATMENT  Viral pharyngitis will usually get better in 3-4 days without the use of medicine. Bacterial pharyngitis is treated with medicines that kill germs (antibiotics).  HOME CARE INSTRUCTIONS   Drink enough water and fluids to keep your urine clear or pale yellow.   Only take over-the-counter or prescription medicines as directed by your health care provider:   If you are prescribed antibiotics, make sure you finish them even if you start to feel better.   Do not take aspirin.   Get lots of rest.   Gargle with 8 oz of salt water ( tsp of salt per 1 qt of water) as often as every 1-2 hours to soothe your throat.    Throat lozenges (if you are not at risk for choking) or sprays may be used to soothe your throat. SEEK MEDICAL CARE IF:   You have large, tender lumps in your neck.  You have a rash.  You cough up green, yellow-brown, or bloody spit. SEEK IMMEDIATE MEDICAL CARE IF:   Your neck becomes stiff.  You drool or are unable to swallow liquids.  You vomit or are unable to keep medicines or liquids down.  You have severe pain that does not go away with the use of recommended medicines.  You have trouble breathing (not caused by a stuffy nose). MAKE SURE YOU:   Understand these instructions.  Will watch your condition.  Will get help right away if you are not doing well or get worse.   This information is not intended to replace advice given to you by your health care provider. Make sure you discuss any questions you have with your health care provider.   Document Released: 09/01/2005 Document Revised: 06/22/2013 Document Reviewed: 05/09/2013 Elsevier Interactive Patient Education [employer]2016 [employer]

## 2016-06-03 NOTE — Progress Notes (Signed)
Pre visit review using our clinic review tool, if applicable. No additional management support is needed unless otherwise documented below in the visit note. 

## 2016-06-04 ENCOUNTER — Encounter: Payer: Self-pay | Admitting: Family

## 2016-06-05 ENCOUNTER — Telehealth: Payer: Self-pay | Admitting: Family

## 2016-06-05 DIAGNOSIS — J0110 Acute frontal sinusitis, unspecified: Secondary | ICD-10-CM

## 2016-06-05 LAB — UPPER RESPIRATORY CULTURE, ROUTINE

## 2016-06-05 MED ORDER — DOXYCYCLINE HYCLATE 100 MG PO TABS: 100.0000 mg | ORAL_TABLET | Freq: Two times a day (BID) | ORAL | 0 refills | Status: DC

## 2016-06-05 NOTE — Telephone Encounter (Signed)
Spoken to patient, She stated that she has had gastricbypass, SX are the same having 20-25 episodes not showing signs of improvement. Please advise.

## 2016-06-05 NOTE — Telephone Encounter (Signed)
Please call patient and let her know new antibiotic prescribed.   Please advise probiotics and to make appt to be seen if diarrhea persists.

## 2016-06-05 NOTE — Telephone Encounter (Signed)
Patient has been informed.

## 2016-06-05 NOTE — Telephone Encounter (Signed)
Please call patient  All antibiotics can cause GI upset.   Is she taking with food??  How many episodes?   Is it improving?

## 2016-06-05 NOTE — Telephone Encounter (Signed)
Pt called about medication of azithromycin (ZITHROMAX) 250 MG tablet. Is causing pt to have diarrhea. Please advise?  Call pt @ [phone]. Thank you!

## 2016-06-05 NOTE — Telephone Encounter (Signed)
Please advise, thanks.

## 2016-06-07 DIAGNOSIS — M4804 Spinal stenosis, thoracic region: Secondary | ICD-10-CM | POA: Diagnosis not present

## 2016-06-19 DIAGNOSIS — F331 Major depressive disorder, recurrent, moderate: Secondary | ICD-10-CM | POA: Diagnosis not present

## 2016-06-19 DIAGNOSIS — M5416 Radiculopathy, lumbar region: Secondary | ICD-10-CM | POA: Diagnosis not present

## 2016-06-19 DIAGNOSIS — M5442 Lumbago with sciatica, left side: Secondary | ICD-10-CM | POA: Diagnosis not present

## 2016-06-19 DIAGNOSIS — M961 Postlaminectomy syndrome, not elsewhere classified: Secondary | ICD-10-CM | POA: Diagnosis not present

## 2016-06-19 DIAGNOSIS — G8929 Other chronic pain: Secondary | ICD-10-CM | POA: Diagnosis not present

## 2016-06-19 DIAGNOSIS — M4808 Spinal stenosis, sacral and sacrococcygeal region: Secondary | ICD-10-CM | POA: Diagnosis not present

## 2016-06-19 DIAGNOSIS — G894 Chronic pain syndrome: Secondary | ICD-10-CM | POA: Diagnosis not present

## 2016-06-24 ENCOUNTER — Telehealth: Payer: Self-pay | Admitting: Family

## 2016-06-24 NOTE — Telephone Encounter (Signed)
pease call patient and advise her to make an appointment. She is overdue on health maintenance and labs, particularly her thyroid lab. I will refill her Synthroid for a 3 month supply however no refills after that as she needs to be seen. 

## 2016-06-24 NOTE — Telephone Encounter (Signed)
pease call patient and advise her to make an appointment. She is overdue on health maintenance and labs, particularly her thyroid lab. I will refill her Synthroid for a 3 month supply however no refills after that as she needs to be seen.

## 2016-06-25 NOTE — Telephone Encounter (Signed)
Please schedule for appointment.

## 2016-06-25 NOTE — Telephone Encounter (Signed)
Ok. I called pt and left a vm for pt to call the office. Thank you!

## 2016-07-04 DIAGNOSIS — G8929 Other chronic pain: Secondary | ICD-10-CM | POA: Diagnosis not present

## 2016-07-04 DIAGNOSIS — F329 Major depressive disorder, single episode, unspecified: Secondary | ICD-10-CM | POA: Diagnosis not present

## 2016-07-04 DIAGNOSIS — M961 Postlaminectomy syndrome, not elsewhere classified: Secondary | ICD-10-CM | POA: Diagnosis not present

## 2016-07-04 DIAGNOSIS — E039 Hypothyroidism, unspecified: Secondary | ICD-10-CM | POA: Diagnosis not present

## 2016-07-04 HISTORY — PX: SPINAL CORD STIMULATOR INSERTION: SHX5378

## 2016-07-17 DIAGNOSIS — G8929 Other chronic pain: Secondary | ICD-10-CM | POA: Diagnosis not present

## 2016-07-17 DIAGNOSIS — M5442 Lumbago with sciatica, left side: Secondary | ICD-10-CM | POA: Diagnosis not present

## 2016-07-31 DIAGNOSIS — M5416 Radiculopathy, lumbar region: Secondary | ICD-10-CM | POA: Diagnosis not present

## 2016-07-31 DIAGNOSIS — M4808 Spinal stenosis, sacral and sacrococcygeal region: Secondary | ICD-10-CM | POA: Diagnosis not present

## 2016-07-31 DIAGNOSIS — F331 Major depressive disorder, recurrent, moderate: Secondary | ICD-10-CM | POA: Diagnosis not present

## 2016-07-31 DIAGNOSIS — G894 Chronic pain syndrome: Secondary | ICD-10-CM | POA: Diagnosis not present

## 2016-08-16 ENCOUNTER — Emergency Department (HOSPITAL_COMMUNITY): Payer: BLUE CROSS/BLUE SHIELD

## 2016-08-16 ENCOUNTER — Encounter (HOSPITAL_COMMUNITY): Payer: Self-pay | Admitting: *Deleted

## 2016-08-16 ENCOUNTER — Emergency Department (HOSPITAL_COMMUNITY)
Admission: EM | Admit: 2016-08-16 | Discharge: 2016-08-16 | Disposition: A | Discharge status: Home / Self Care | Payer: BLUE CROSS/BLUE SHIELD | Attending: Emergency Medicine | Admitting: Emergency Medicine

## 2016-08-16 DIAGNOSIS — E039 Hypothyroidism, unspecified: Secondary | ICD-10-CM | POA: Diagnosis not present

## 2016-08-16 DIAGNOSIS — S3992XA Unspecified injury of lower back, initial encounter: Secondary | ICD-10-CM | POA: Diagnosis not present

## 2016-08-16 DIAGNOSIS — Y999 Unspecified external cause status: Secondary | ICD-10-CM | POA: Insufficient documentation

## 2016-08-16 DIAGNOSIS — Z79899 Other long term (current) drug therapy: Secondary | ICD-10-CM | POA: Insufficient documentation

## 2016-08-16 DIAGNOSIS — Y929 Unspecified place or not applicable: Secondary | ICD-10-CM | POA: Insufficient documentation

## 2016-08-16 DIAGNOSIS — S299XXA Unspecified injury of thorax, initial encounter: Secondary | ICD-10-CM | POA: Diagnosis not present

## 2016-08-16 DIAGNOSIS — S0003XA Contusion of scalp, initial encounter: Secondary | ICD-10-CM | POA: Diagnosis not present

## 2016-08-16 DIAGNOSIS — S199XXA Unspecified injury of neck, initial encounter: Secondary | ICD-10-CM | POA: Diagnosis not present

## 2016-08-16 DIAGNOSIS — S0990XA Unspecified injury of head, initial encounter: Secondary | ICD-10-CM | POA: Diagnosis not present

## 2016-08-16 DIAGNOSIS — S29002A Unspecified injury of muscle and tendon of back wall of thorax, initial encounter: Secondary | ICD-10-CM | POA: Diagnosis not present

## 2016-08-16 DIAGNOSIS — E119 Type 2 diabetes mellitus without complications: Secondary | ICD-10-CM | POA: Insufficient documentation

## 2016-08-16 DIAGNOSIS — I10 Essential (primary) hypertension: Secondary | ICD-10-CM | POA: Diagnosis not present

## 2016-08-16 DIAGNOSIS — W101XXA Fall (on)(from) sidewalk curb, initial encounter: Secondary | ICD-10-CM | POA: Diagnosis not present

## 2016-08-16 DIAGNOSIS — Y939 Activity, unspecified: Secondary | ICD-10-CM | POA: Diagnosis not present

## 2016-08-16 DIAGNOSIS — W19XXXA Unspecified fall, initial encounter: Secondary | ICD-10-CM

## 2016-08-16 DIAGNOSIS — M549 Dorsalgia, unspecified: Secondary | ICD-10-CM | POA: Diagnosis not present

## 2016-08-16 DIAGNOSIS — S098XXA Other specified injuries of head, initial encounter: Secondary | ICD-10-CM | POA: Diagnosis not present

## 2016-08-16 NOTE — ED Triage Notes (Addendum)
Pt tri[place] sidewalk [place], hitting back of head, no LOC, history of back problems, some back pain, C-Collar intact. VS 138/90-92-94% rm 18 resp

## 2016-08-16 NOTE — ED Provider Notes (Signed)
[facility]-EMERGENCY DEPT Provider Note   CSN: [id number] Arrival date & time: 08/16/16  1434     History   Chief Complaint Chief Complaint  Patient presents with  . Fall    HPI [name] is a 50 y.o. female.  She presents for head injury after fall, when she misstepped, initially fell backwards, struck her head on the pavement. She did not lose consciousness. She did not get up, until she received assistance from EMS. She presents with a cervical collar on, complaining of pain in her head, neck, and "my whole back". She has chronic back pain and recently had a spinal stimulator placed. She denies weakness of arms or legs at his time. No recent illnesses. She believes that this was an accidental fall. There are no other known modifying factors.  HPI  Past Medical History:  Diagnosis Date  . Anxiety and depression   . DM2 (diabetes mellitus, type 2) (HCC)   . HLD (hyperlipidemia)   . HTN (hypertension)   . Hypothyroid   . Obesity     Patient Active Problem List   Diagnosis Date Noted  . Dizziness 05/15/2015  . Right-sided low back pain without sciatica 10/27/2014  . Hyperpigmentation 05/05/2014  . Viral wart on finger 05/05/2014  . Gluteal pain 12/28/2013  . Elevated LFTs 12/13/2013  . Routine general medical examination at health care facility 01/28/2013  . Depression with anxiety 10/26/2012  . Anemia 05/05/2012  . HYPOTHYROIDISM 10/28/2007  . HYPERCHOLESTEROLEMIA, PURE 05/21/2007    Past Surgical History:  Procedure Laterality Date  . Abd [place] - gallstones  5/03  . BACK SURGERY  2009   decompressive laminectomy  . BARIATRIC SURGERY  03/02/12   gastric bypass  . CHOLECYSTECTOMY  6/03  . DILATION AND CURETTAGE OF UTERUS  1/05  . KNEE SURGERY     L - arthroscopic  . LIVER BIOPSY  6/03   fatty liver  . LUMBAR SPINE SURGERY  2015   Dr. Amalia Greenhouse  . NASAL SINUS SURGERY    . PANNICULECTOMY  08/2013   [facility], Dr. Diamond Nickel  . sleep study - apnea  8/02    OB  History    No data available       Home Medications    Prior to Admission medications   Medication Sig Start Date End Date Taking? Authorizing Provider  baclofen (LIORESAL) 10 MG tablet Take 10 mg by mouth daily. 07/28/16   Historical Provider, MD  Biotin (BIOTIN 5000) 5 MG CAPS Take by mouth daily.    Historical Provider, MD  buPROPion (WELLBUTRIN SR) 200 MG 12 hr tablet Take 200 mg by mouth 2 (two) times daily. 08/04/16   Historical Provider, MD  BUTRANS 10 MCG/HR PTWK patch Apply 1 patch topically once a week. 08/01/16   Historical Provider, MD  cholecalciferol (VITAMIN D) 1000 UNITS tablet Take 1,000 Units by mouth daily.    Historical Provider, MD  doxycycline (VIBRA-TABS) 100 MG tablet Take 1 tablet (100 mg total) by mouth 2 (two) times daily. 06/05/16   Allegra Grana, FNP  DULoxetine (CYMBALTA) 60 MG capsule Take 60 mg by mouth 2 (two) times daily. 07/28/16   Historical Provider, MD  FLUoxetine (PROZAC) 20 MG tablet TAKE 1 TABLET (20 MG TOTAL) BY MOUTH DAILY. 04/20/15   Shelia Media, MD  gabapentin (NEURONTIN) 300 MG capsule TAKE 1 CAPSULE (300 MG TOTAL) BY MOUTH 3 (THREE) TIMES DAILY. 09/24/15   Shelia Media, MD  gabapentin (NEURONTIN) 400 MG capsule  Take 400 mg by mouth 4 (four) times daily. 07/31/16   Historical Provider, MD  HYDROcodone-acetaminophen (NORCO/VICODIN) 5-325 MG per tablet Take 1-2 tablets by mouth 3 (three) times daily as needed for moderate pain. 12/12/14   Shelia MediaJennifer A Walker, MD  HYDROmorphone (DILAUDID) 4 MG tablet Take 4 mg by mouth 4 (four) times daily. 08/15/16   Historical Provider, MD  IRON, FERROUS GLUCONATE, PO Take 130 mcg by mouth.    Historical Provider, MD  levothyroxine (SYNTHROID, LEVOTHROID) 100 MCG tablet TAKE 1 TABLET (100 MCG TOTAL) BY MOUTH DAILY. 09/05/15   Shelia MediaJennifer A Walker, MD  meclizine (ANTIVERT) 25 MG tablet Take 1 tablet (25 mg total) by mouth 3 (three) times daily as needed for dizziness. 05/15/15   Shelia MediaJennifer A Walker, MD  Multiple  Vitamins-Minerals (MULTIVITAL) tablet Take 4 by mouth daily    Historical Provider, MD  Omega Fatty Acids-Vitamins (OMEGA-3 GUMMIES PO) Take 2 by mouth daily    Historical Provider, MD  Oxycodone HCl 10 MG TABS Take 10 mg by mouth 3 (three) times daily. 07/22/16   Historical Provider, MD  vitamin B-12 (CYANOCOBALAMIN) 1000 MCG tablet Take 1,000 mcg by mouth daily.    Historical Provider, MD    Family History Family History  Problem Relation Age of Onset  . Cancer Father     Social History Social History  Substance Use Topics  . Smoking status: Never Smoker  . Smokeless tobacco: Never Used     Comment: tobacco use - no  . Alcohol use No     Allergies   Glipizide; Lyrica [pregabalin]; Metformin; and Penicillins   Review of Systems Review of Systems   Physical Exam Updated Vital Signs BP 129/77 (BP Location: Left Arm)   Pulse 85   Temp 97.5 F (36.4 C) (Oral)   Resp 16   Ht 5' 5.5" (1.664 m)   Wt 195 lb (88.5 kg)   LMP 12/16/2015   SpO2 97%   BMI 31.96 kg/m   Physical Exam  Constitutional: She is oriented to person, place, and time. She appears well-developed and well-nourished. She appears distressed (She is uncomfortable).  HENT:  Head: Normocephalic.  Tender posterior scalp region without swelling, deformity, abrasion or laceration.  Eyes: Conjunctivae and EOM are normal. Pupils are equal, round, and reactive to light.  Neck: Normal range of motion and phonation normal. Neck supple.  Cardiovascular: Normal rate and regular rhythm.   Pulmonary/Chest: Effort normal and breath sounds normal. She exhibits no tenderness.  Abdominal: Soft. She exhibits no distension. There is no tenderness. There is no guarding.  Musculoskeletal: Normal range of motion.  Moderate tenderness, thoracic, lumbar spine. Cervical spinal immobilized.  Neurological: She is alert and oriented to person, place, and time. No cranial nerve deficit. She exhibits normal muscle tone. Coordination  normal.  Skin: Skin is warm and dry.  Psychiatric: She has a normal mood and affect. Her behavior is normal. Judgment and thought content normal.  Nursing note and vitals reviewed.    ED Treatments / Results  Labs (all labs ordered are listed, but only abnormal results are displayed) Labs Reviewed - No data to display  EKG  EKG Interpretation None       Radiology Dg Thoracic Spine 2 View  Result Date: 08/16/2016 CLINICAL DATA:  [place], hitting back of head, no LOC, previous lumbar surgery 2009 EXAM: THORACIC SPINE 2 VIEWS COMPARISON:  Chest radiograph, 10/31/2011 FINDINGS: No fracture.  No spondylolisthesis.  No bone  lesion. Mild endplate spurring noted along the mid thoracic spine. No other degenerative change. A spine stimulator projects along the posterior aspect of the mid thoracic spinal canal. Soft tissues are unremarkable. IMPRESSION: No fracture or acute finding. Electronically Signed   By: Amie Portland M.D.   On: 08/16/2016 16:13   Dg Lumbar Spine Complete  Result Date: 08/16/2016 CLINICAL DATA:  Pt tri[place], hitting back of head, no LOC, previous lumbar surgery 2009 EXAM: LUMBAR SPINE - COMPLETE 4+ VIEW COMPARISON:  Lumbar MRI, 12/09/2014 FINDINGS: Since prior exam, at L4-L5 posterior lumbar spine fusion has been performed. Bilateral pedicle screws are well-positioned in well-seated. There is a radiolucent disc spacer well-centered within the L4-L5 disc interspace. No fracture.  No spondylolisthesis. Mild loss disc height at L5-S1. Soft tissues are unremarkable. IMPRESSION: 1. No fracture or acute finding. 2. Orthopedic hardware from the previous L4-L5 fusion is well-positioned and well-seated. Electronically Signed   By: Amie Portland M.D.   On: 08/16/2016 16:15   Ct Head Wo Contrast  Result Date: 08/16/2016 CLINICAL DATA:  50 year old with fall.  Hit back of head. EXAM: CT HEAD WITHOUT CONTRAST CT CERVICAL  SPINE WITHOUT CONTRAST TECHNIQUE: Multidetector CT imaging of the head and cervical spine was performed following the standard protocol without intravenous contrast. Multiplanar CT image reconstructions of the cervical spine were also generated. 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 No evidence for acute hemorrhage, mass lesion, midline shift, hydrocephalus or new infarct. Vascular: No hyperdense vessel or unexpected calcification. Skull: No calvarial fracture. Sinuses/Orbits: Paranasal sinuses are clear. Other: Scalp hematoma along the right posterior vertex. CT CERVICAL SPINE FINDINGS Alignment: Normal. Skull base and vertebrae: No acute fracture. Small amount of fluid in left mastoid air cells. Soft tissues and spinal canal: Thyroid tissue is heterogeneous with scattered calcifications. There is no significant soft tissue swelling. No significant lymphadenopathy. Disc levels:  Disc spaces are maintained. Upper chest: Negative Other: None IMPRESSION: No acute intracranial abnormality. No acute bone abnormality in cervical spine. Scalp hematoma along the posterior vertex.  No calvarial fracture. Small amount of fluid in left mastoid air cells. Old insult in the right frontal-parietal cortex. Electronically Signed   By: Richarda Overlie M.D.   On: 08/16/2016 16:11   Ct Cervical Spine Wo Contrast  Result Date: 08/16/2016 CLINICAL DATA:  50 year old with fall.  Hit back of head. EXAM: CT HEAD WITHOUT CONTRAST CT CERVICAL SPINE WITHOUT CONTRAST TECHNIQUE: Multidetector CT imaging of the head and cervical spine was performed following the standard protocol without intravenous contrast. Multiplanar CT image reconstructions of the cervical spine were also generated. 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 No  evidence for acute hemorrhage, mass lesion, midline shift, hydrocephalus or new infarct. Vascular: No hyperdense vessel or unexpected calcification. Skull: No calvarial fracture. Sinuses/Orbits: Paranasal sinuses are clear. Other: Scalp hematoma along the right posterior vertex. CT CERVICAL SPINE FINDINGS Alignment: Normal. Skull base and vertebrae: No acute fracture. Small amount of fluid in left mastoid air cells. Soft tissues and spinal canal: Thyroid tissue is heterogeneous with scattered calcifications. There is no significant soft tissue swelling. No significant lymphadenopathy. Disc levels:  Disc spaces are maintained. Upper chest: Negative Other: None IMPRESSION: No acute intracranial abnormality. No acute bone abnormality in cervical spine. Scalp hematoma along the posterior vertex.  No calvarial fracture. Small amount of fluid  in left mastoid air cells. Old insult in the right frontal-parietal cortex. Electronically Signed   By: Richarda OverlieAdam  Henn M.D.   On: 08/16/2016 16:11    Procedures Procedures (including critical care time)  Medications Ordered in ED Medications - No data to display   Initial Impression / Assessment and Plan / ED Course  I have reviewed the triage vital signs and the nursing notes.  Pertinent labs & imaging results that were available during my care of the patient were reviewed by me and considered in my medical decision making (see chart for details).  Clinical Course as of Aug 16 1628  Sat Aug 16, 2016  1621 DG Lumbar Spine Complete [EW]  1621 DG Thoracic Spine 2 View [EW]  1621 CT Head Wo Contrast [EW]  1622 CT Cervical Spine Wo Contrast [EW]    Clinical Course User Index [EW] Mancel BaleElliott Kimberlyann Hollar, MD    Medications - No data to display  Patient Vitals for the past 24 hrs:  BP Temp Temp src Pulse Resp SpO2 Height Weight  08/16/16 1444 129/77 97.5 F (36.4 C) Oral 85 16 97 % 5' 5.5" (1.664 m) 195 lb (88.5 kg)    4:22 PM Reevaluation with update and discussion.  After initial assessment and treatment, an updated evaluation reveals No change in clinical status. Cervical collar removed, and she demonstrates normal range of motion of the neck. Findings discussed with patient, all questions answered. Godfrey Tritschler L    Final Clinical Impressions(s) / ED Diagnoses   Final diagnoses:  Injury of head, initial encounter  Injury of back, initial encounter  Fall, initial encounter    Mechanical fall with head injury and multiple contusions. Severe head injury, spinal cord injury, spinal fracture or rib fracture.  Nursing Notes Reviewed/ Care Coordinated Applicable Imaging Reviewed Interpretation of Laboratory Data incorporated into ED treatment  The patient appears reasonably screened and/or stabilized for discharge and I doubt any other medical condition or other [facility] requiring further screening, evaluation, or treatment in the ED at this time prior to discharge.  Plan: Home Medications- continue; Home Treatments- rest; return here if the recommended treatment, does not improve the symptoms; Recommended follow up- PCP prn   New Prescriptions New Prescriptions   No medications on file     Mancel BaleElliott Othello Dickenson, MD 08/16/16 1630

## 2016-08-16 NOTE — Discharge Instructions (Signed)
Watch for signs of progressive head injury including dizziness, weakness, nausea, vomiting, or confusion.  Use ice on the sore spots 3 or 4 times a day.

## 2016-08-16 NOTE — ED Notes (Signed)
Bed: WA20 Expected date:  Expected time:  Means of arrival:  Comments: EMS-fall 

## 2016-08-19 ENCOUNTER — Telehealth: Payer: Self-pay | Admitting: Family

## 2016-08-19 ENCOUNTER — Encounter (HOSPITAL_COMMUNITY): Payer: Self-pay | Admitting: Emergency Medicine

## 2016-08-19 ENCOUNTER — Telehealth: Payer: Self-pay | Admitting: *Deleted

## 2016-08-19 ENCOUNTER — Emergency Department (HOSPITAL_COMMUNITY)
Admission: EM | Admit: 2016-08-19 | Discharge: 2016-08-19 | Disposition: A | Discharge status: Home / Self Care | Payer: BLUE CROSS/BLUE SHIELD | Attending: Emergency Medicine | Admitting: Emergency Medicine

## 2016-08-19 DIAGNOSIS — E039 Hypothyroidism, unspecified: Secondary | ICD-10-CM | POA: Diagnosis not present

## 2016-08-19 DIAGNOSIS — E119 Type 2 diabetes mellitus without complications: Secondary | ICD-10-CM | POA: Insufficient documentation

## 2016-08-19 DIAGNOSIS — F0781 Postconcussional syndrome: Secondary | ICD-10-CM | POA: Diagnosis not present

## 2016-08-19 DIAGNOSIS — I10 Essential (primary) hypertension: Secondary | ICD-10-CM | POA: Diagnosis not present

## 2016-08-19 DIAGNOSIS — R42 Dizziness and giddiness: Secondary | ICD-10-CM | POA: Insufficient documentation

## 2016-08-19 LAB — CBC
HEMATOCRIT: 32.2 % — AB (ref 36.0–46.0)
HEMOGLOBIN: 10.4 g/dL — AB (ref 12.0–15.0)
MCH: 29.6 pg (ref 26.0–34.0)
MCHC: 32.3 g/dL (ref 30.0–36.0)
MCV: 91.7 fL (ref 78.0–100.0)
Platelets: 231 10*3/uL (ref 150–400)
RBC: 3.51 MIL/uL — ABNORMAL LOW (ref 3.87–5.11)
RDW: 14.8 % (ref 11.5–15.5)
WBC: 5.7 10*3/uL (ref 4.0–10.5)

## 2016-08-19 LAB — BASIC METABOLIC PANEL
Anion gap: 7 (ref 5–15)
BUN: 20 mg/dL (ref 6–20)
CHLORIDE: 101 mmol/L (ref 101–111)
CO2: 30 mmol/L (ref 22–32)
Calcium: 8.8 mg/dL — ABNORMAL LOW (ref 8.9–10.3)
Creatinine, Ser: 0.69 mg/dL (ref 0.44–1.00)
GFR calc Af Amer: >60 mL/min (ref >60)
GFR calc non Af Amer: >60 mL/min (ref >60)
GLUCOSE: 87 mg/dL (ref 65–99)
POTASSIUM: 4.4 mmol/L (ref 3.5–5.1)
SODIUM: 138 mmol/L (ref 135–145)

## 2016-08-19 LAB — URINALYSIS, ROUTINE W REFLEX MICROSCOPIC
BACTERIA UA: NONE SEEN
Bilirubin Urine: NEGATIVE
Glucose, UA: 50 mg/dL — AB
Hgb urine dipstick: NEGATIVE
KETONES UR: NEGATIVE mg/dL
Nitrite: NEGATIVE
PH: 6 (ref 5.0–8.0)
PROTEIN: NEGATIVE mg/dL
Specific Gravity, Urine: 1.02 (ref 1.005–1.030)

## 2016-08-19 LAB — CBG MONITORING, ED: Glucose-Capillary: 84 mg/dL (ref 65–99)

## 2016-08-19 MED ORDER — SODIUM CHLORIDE 0.9 % IV BOLUS (SEPSIS)
1000.0000 mL | Freq: Once | INTRAVENOUS | Status: AC
Start: 2016-08-19 — End: 2016-08-19
  Administered 2016-08-19: 1000 mL via INTRAVENOUS

## 2016-08-19 MED ORDER — MECLIZINE HCL 25 MG PO TABS: 25.0000 mg | ORAL_TABLET | Freq: Three times a day (TID) | ORAL | 0 refills | Status: DC | PRN

## 2016-08-19 MED ORDER — MECLIZINE HCL 25 MG PO TABS
50.0000 mg | ORAL_TABLET | Freq: Once | ORAL | Status: AC
  Administered 2016-08-19: 50 mg via ORAL
  Filled 2016-08-19: qty 2

## 2016-08-19 NOTE — ED Provider Notes (Signed)
[facility] DEPT Provider Note   CSN: [id number] Arrival date & time: 08/19/16  1027     History   Chief Complaint Chief Complaint  Patient presents with  . Dizziness  . Headache    HPI [name] is a 50 y.o. female who presents with dizziness and a headache. PMH significant for hx of vertigo, anxiety/depression, chronic pain, Type 2 DM, HTN/HLD. 3 days ago she was seen and evaluated in the ER. She had a CT of the head and neck which was negative. She states that she felt okay when she went home but has had progressively worsening dizziness. She reports a persistent headache over where she hit her head as well. She is not currently on any medicine for vertigo because it resolved. She reports the feeling of the room spinning. She does not feel like she is going to pass out. She states she can walk but has to hold on to things for balance. Endorses blurry vision and difficulty focusing her vision. Denies weakness, photophobia, anxiety, irritability, emotional lability, fatigue. She cannot remember the details of what happened 3 days ago or what she was told. She tried to follow up with her doctor today but they advised her to come to the ER.  HPI  Past Medical History:  Diagnosis Date  . Anxiety and depression   . DM2 (diabetes mellitus, type 2) (HCC)   . HLD (hyperlipidemia)   . HTN (hypertension)   . Hypothyroid   . Obesity     Patient Active Problem List   Diagnosis Date Noted  . Dizziness 05/15/2015  . Right-sided low back pain without sciatica 10/27/2014  . Hyperpigmentation 05/05/2014  . Viral wart on finger 05/05/2014  . Gluteal pain 12/28/2013  . Elevated LFTs 12/13/2013  . Depression with anxiety 10/26/2012  . Anemia 05/05/2012  . HYPOTHYROIDISM 10/28/2007  . HYPERCHOLESTEROLEMIA, PURE 05/21/2007    Past Surgical History:  Procedure Laterality Date  . Abd [place] - gallstones  5/03  . BACK SURGERY  2009   decompressive laminectomy  . BARIATRIC SURGERY   03/02/12   gastric bypass  . CHOLECYSTECTOMY  6/03  . DILATION AND CURETTAGE OF UTERUS  1/05  . KNEE SURGERY     L - arthroscopic  . LIVER BIOPSY  6/03   fatty liver  . LUMBAR SPINE SURGERY  2015   Dr. Amalia Greenhouse  . NASAL SINUS SURGERY    . PANNICULECTOMY  08/2013   [facility], Dr. Diamond Nickel  . sleep study - apnea  8/02    OB History    No data available       Home Medications    Prior to Admission medications   Medication Sig Start Date End Date Taking? Authorizing Provider  baclofen (LIORESAL) 10 MG tablet Take 10 mg by mouth daily. 07/28/16   Historical Provider, MD  Biotin (BIOTIN 5000) 5 MG CAPS Take by mouth daily.    Historical Provider, MD  buPROPion (WELLBUTRIN SR) 200 MG 12 hr tablet Take 200 mg by mouth 2 (two) times daily. 08/04/16   Historical Provider, MD  BUTRANS 10 MCG/HR PTWK patch Apply 1 patch topically once a week. 08/01/16   Historical Provider, MD  cholecalciferol (VITAMIN D) 1000 UNITS tablet Take 1,000 Units by mouth daily.    Historical Provider, MD  doxycycline (VIBRA-TABS) 100 MG tablet Take 1 tablet (100 mg total) by mouth 2 (two) times daily. 06/05/16   Allegra Grana, FNP  DULoxetine (CYMBALTA) 60 MG capsule Take 60 mg  by mouth 2 (two) times daily. 07/28/16   Historical Provider, MD  FLUoxetine (PROZAC) 20 MG tablet TAKE 1 TABLET (20 MG TOTAL) BY MOUTH DAILY. 04/20/15   Shelia MediaJennifer A Walker, MD  gabapentin (NEURONTIN) 300 MG capsule TAKE 1 CAPSULE (300 MG TOTAL) BY MOUTH 3 (THREE) TIMES DAILY. 09/24/15   Shelia MediaJennifer A Walker, MD  gabapentin (NEURONTIN) 400 MG capsule Take 400 mg by mouth 4 (four) times daily. 07/31/16   Historical Provider, MD  HYDROcodone-acetaminophen (NORCO/VICODIN) 5-325 MG per tablet Take 1-2 tablets by mouth 3 (three) times daily as needed for moderate pain. 12/12/14   Shelia MediaJennifer A Walker, MD  HYDROmorphone (DILAUDID) 4 MG tablet Take 4 mg by mouth 4 (four) times daily. 08/15/16   Historical Provider, MD  IRON, FERROUS GLUCONATE, PO Take 130 mcg by  mouth.    Historical Provider, MD  levothyroxine (SYNTHROID, LEVOTHROID) 100 MCG tablet TAKE 1 TABLET (100 MCG TOTAL) BY MOUTH DAILY. 09/05/15   Shelia MediaJennifer A Walker, MD  meclizine (ANTIVERT) 25 MG tablet Take 1 tablet (25 mg total) by mouth 3 (three) times daily as needed for dizziness. 05/15/15   Shelia MediaJennifer A Walker, MD  Multiple Vitamins-Minerals (MULTIVITAL) tablet Take 4 by mouth daily    Historical Provider, MD  Omega Fatty Acids-Vitamins (OMEGA-3 GUMMIES PO) Take 2 by mouth daily    Historical Provider, MD  Oxycodone HCl 10 MG TABS Take 10 mg by mouth 3 (three) times daily. 07/22/16   Historical Provider, MD  vitamin B-12 (CYANOCOBALAMIN) 1000 MCG tablet Take 1,000 mcg by mouth daily.    Historical Provider, MD    Family History Family History  Problem Relation Age of Onset  . Cancer Father     Social History Social History  Substance Use Topics  . Smoking status: Never Smoker  . Smokeless tobacco: Never Used     Comment: tobacco use - no  . Alcohol use No     Allergies   Glipizide; Lyrica [pregabalin]; Metformin; and Penicillins   Review of Systems Review of Systems  Constitutional: Negative for fatigue.  Eyes: Positive for visual disturbance. Negative for photophobia.  Respiratory: Negative for shortness of breath.   Cardiovascular: Negative for chest pain.  Gastrointestinal: Negative for nausea and vomiting.  Musculoskeletal: Positive for gait problem.  Neurological: Positive for dizziness and headaches. Negative for syncope, weakness, light-headedness and numbness.  Psychiatric/Behavioral: Negative for confusion and dysphoric mood.  All other systems reviewed and are negative.    Physical Exam Updated Vital Signs BP 108/66 (BP Location: Right Arm)   Pulse 79   Temp 98.1 F (36.7 C) (Oral)   Resp 18   Ht 5\' 5"  (1.651 m)   Wt 88.5 kg   LMP 12/16/2015   SpO2 96%   BMI 32.45 kg/m   Physical Exam  Constitutional: She is oriented to person, place, and time. She  appears well-developed and well-nourished. No distress.  HENT:  Head: Normocephalic and atraumatic.  Eyes: Conjunctivae are normal. Pupils are equal, round, and reactive to light. Right eye exhibits no discharge. Left eye exhibits no discharge. No scleral icterus.  Neck: Normal range of motion.  Reports dizziness with head movement  Cardiovascular: Normal rate.   Pulmonary/Chest: Effort normal. No respiratory distress.  Abdominal: She exhibits no distension.  Neurological: She is alert and oriented to person, place, and time.  Mental Status:  Alert, oriented, thought content appropriate, able to give a coherent history. Speech fluent without evidence of aphasia. Able to follow 2 step commands without difficulty.  Cranial Nerves:  II:  Peripheral visual fields grossly normal, pupils equal, round, reactive to light III,IV, VI: ptosis not present. Mild nystagmus noted with EOM to the right. V,VII: smile symmetric, facial light touch sensation equal VIII: hearing grossly normal to voice  X: uvula elevates symmetrically  XI: bilateral shoulder shrug symmetric and strong XII: midline tongue extension without fassiculations Motor:  Normal tone. 5/5 in upper and lower extremities bilaterally including strong and equal grip strength and dorsiflexion/plantar flexion Sensory: Pinprick and light touch normal in all extremities.  Cerebellar: normal finger-to-nose with bilateral upper extremities Gait: Guarded, slow gait CV: distal pulses palpable throughout    Skin: Skin is warm and dry.  Psychiatric: She has a normal mood and affect. Her behavior is normal.  Nursing note and vitals reviewed.    ED Treatments / Results  Labs (all labs ordered are listed, but only abnormal results are displayed) Labs Reviewed  BASIC METABOLIC PANEL - Abnormal; Notable for the following:       Result Value   Calcium 8.8 (*)    All other components within normal limits  CBC - Abnormal; Notable for the  following:    RBC 3.51 (*)    Hemoglobin 10.4 (*)    HCT 32.2 (*)    All other components within normal limits  URINALYSIS, ROUTINE W REFLEX MICROSCOPIC - Abnormal; Notable for the following:    Glucose, UA 50 (*)    Leukocytes, UA TRACE (*)    Squamous Epithelial / LPF 0-5 (*)    All other components within normal limits  CBG MONITORING, ED    EKG  EKG Interpretation None       Radiology No results found.  Procedures Procedures (including critical care time)  Medications Ordered in ED Medications  meclizine (ANTIVERT) tablet 50 mg (50 mg Oral Given 08/19/16 1203)  sodium chloride 0.9 % bolus 1,000 mL (0 mLs Intravenous Stopped 08/19/16 1617)     Initial Impression / Assessment and Plan / ED Course  I have reviewed the triage vital signs and the nursing notes.  Pertinent labs & imaging results that were available during my care of the patient were reviewed by me and considered in my medical decision making (see chart for details).  Clinical Course    50 year old female with symptoms consistent with post concussion syndrome. Patient is afebrile, not tachycardic or tachypneic, and not hypoxic. Orthostatics checked and technically not orthostatic although pressures are on the low side. 1L IVF and meclizine given. Neuro exam remarkable for nystagmus and subjective dizziness with head turning. Gait is not unstable. No dysmetria. Do not feel repeat imaging is indicated currently.  After observation in ED and treatment patient reports some improvement although not resolution of symptoms. Rx for meclizine given. Advised caution with any driving until symptoms improve significantly. Follow up with PCP. Patient is NAD, non-toxic, with stable VS. Patient is informed of clinical course, understands medical decision making process, and agrees with plan. Opportunity for questions provided and all questions answered. Return precautions given.    Final Clinical Impressions(s) / ED  Diagnoses   Final diagnoses:  Post concussion syndrome    New Prescriptions Discharge Medication List as of 08/19/2016  4:11 PM    START taking these medications   Details  meclizine (ANTIVERT) 25 MG tablet Take 1 tablet (25 mg total) by mouth 3 (three) times daily as needed for dizziness., Starting Tue 08/19/2016, Print         Jory Sims  Davina PokeGekas, PA-C 08/19/16 1650    Nira ConnPedro Eduardo Cardama, MD 08/21/16 1146

## 2016-08-19 NOTE — Telephone Encounter (Signed)
noted 

## 2016-08-19 NOTE — Telephone Encounter (Signed)
Pt was transferred to the Nurse Line for dizziness and off set balance .  Pt was seen in the ER on 12/2 for head injury

## 2016-08-19 NOTE — Telephone Encounter (Signed)
Patient Name: [name]  DOB: 07/23/1966    Initial Comment Caller States- pt was seen in ED 12/02 from head injury, dizziness is now worse, and she is off balance   Nurse Assessment  Nurse: Renaldo FiddlerAdkins, RN, Raynelle FanningJulie Date/Time Lamount Cohen(Eastern Time): 08/19/2016 9:19:24 AM  Confirm and document reason for call. If symptomatic, describe symptoms. ---Caller states she was seen in ED 12/02 from head injury, fell backwards and hit her head on a curb, She feels like her dizziness is now worse, and she is off balance. She was dx with "concussion like sx" after CT. Caller is at work.  Does the patient have any new or worsening symptoms? ---Yes  Will a triage be completed? ---Yes  Related visit to physician within the last 2 weeks? ---Yes  Does the PT have any chronic conditions? (i.e. diabetes, asthma, etc.) ---Yes  List chronic conditions. ---Hx back pain  Is the patient pregnant or possibly pregnant? (Ask all females between the ages of 8812-55) ---No  Is this a behavioral health or substance abuse call? ---No     Guidelines    Guideline Title Affirmed Question Affirmed Notes  Concussion (mTBI) Less Than 14 Days Ago Follow-up Call Concussion symptoms are worsening    Final Disposition User   Go to ED Now (or PCP triage) Renaldo FiddlerAdkins, RN, Raynelle FanningJulie    Referrals  [facility] - ED   Disagree/Comply: Comply

## 2016-08-19 NOTE — ED Notes (Signed)
Made Tresa EndoKelly PA aware of orthostatic VS

## 2016-08-19 NOTE — Telephone Encounter (Signed)
FYI

## 2016-08-19 NOTE — Telephone Encounter (Signed)
[facility] note directed her to the ED.

## 2016-08-19 NOTE — Discharge Instructions (Signed)
Please do not drive until your vision and dizziness gets better Take Meclizine as needed Follow up with your PCP

## 2016-08-19 NOTE — ED Notes (Addendum)
Ambulated patient up the hall upon returning to room patient said that she was still dizzy but not as dizzy as she was when she came in. RN notified

## 2016-08-19 NOTE — ED Triage Notes (Signed)
Patient reports she was seen here on Saturday for a fall/head injury. Patient is continuing to have dizziness/headaches. No other neuro deficits noted. Patient took Excedrin for headache this morning with some relief.

## 2016-08-21 ENCOUNTER — Encounter: Payer: Self-pay | Admitting: Family

## 2016-08-21 ENCOUNTER — Ambulatory Visit (INDEPENDENT_AMBULATORY_CARE_PROVIDER_SITE_OTHER): Payer: BLUE CROSS/BLUE SHIELD | Admitting: Family

## 2016-08-21 VITALS — BP 112/70 | HR 83 | Temp 98.0°F | Ht 65.0 in | Wt 207.0 lb

## 2016-08-21 DIAGNOSIS — H8110 Benign paroxysmal vertigo, unspecified ear: Secondary | ICD-10-CM

## 2016-08-21 DIAGNOSIS — Z23 Encounter for immunization: Secondary | ICD-10-CM | POA: Diagnosis not present

## 2016-08-21 NOTE — Progress Notes (Signed)
Pre visit review using our clinic review tool, if applicable. No additional management support is needed unless otherwise documented below in the visit note. 

## 2016-08-21 NOTE — Progress Notes (Signed)
Subjective:    Patient ID: [name], female    DOB: 1966/02/16, 50 y.o.   MRN: [id number]  CC: [name] is a 50 y.o. female who presents today for follow up.   HPI: Here CC: vertigo 'room spinning' , worsening.   Rolling over in bed, dipping head down. Onset one week ago after tripping on uneven payment and hit back of head on curb. No LOC. No real difference with meclizine.   No hearing loss, tinnitus, sinus congestion. Describes blurry vision when looking at computer, wears 'readers.'   H/o lumbar fusion 2009; spine stimulator placed 06/2016 by Dr Elinor DodgeIdler; told if leaked, could cause HA's.     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 Reviewed course. Given meclizine 12/2 ED Hit head on curb L spine- prior L4-L5 fusion T-spine- normal C spine- normal CT head- no acute; scalp hematoma  HISTORY:  Past Medical History:  Diagnosis Date  . Anxiety and depression   . DM2 (diabetes mellitus, type 2) (HCC)   . HLD (hyperlipidemia)   . HTN (hypertension)   . Hypothyroid   . Obesity    Past Surgical History:  Procedure Laterality Date  . Abd US - gallstones  5/03  . BACK SURGERY  2009   decompressive laminectomy  . BARIATRIC SURGERY  03/02/12   gastric bypass  . CHOLECYSTECTOMY  6/03  . DILATION AND CURETTAGE OF UTERUS  1/05  . KNEE SURGERY     L - arthroscopic  . LIVER BIOPSY  6/03   fatty liver  . LUMBAR SPINE SURGERY  2015   Dr. Amalia Greenhouseehmig  . NASAL SINUS SURGERY    . PANNICULECTOMY  08/2013   [facility], Dr. Diamond NickelErdman  . sleep study - apnea  8/02   Family History  Problem Relation Age of Onset  . Cancer Father     Allergies: Glipizide; Lyrica [pregabalin]; Metformin; Penicillins; and Adhesive [tape] Current Outpatient Prescriptions on File Prior to Visit  Medication Sig Dispense Refill  . baclofen (LIORESAL) 10 MG tablet Take 10 mg by mouth daily.    Marland Kitchen. buPROPion (WELLBUTRIN SR) 200 MG 12 hr tablet Take 200 mg by mouth 2 (two) times daily.    . DULoxetine  (CYMBALTA) 60 MG capsule Take 60 mg by mouth 2 (two) times daily.    Marland Kitchen. gabapentin (NEURONTIN) 400 MG capsule Take 400 mg by mouth 4 (four) times daily.    Marland Kitchen. HYDROmorphone (DILAUDID) 4 MG tablet Take 4 mg by mouth 4 (four) times daily as needed for moderate pain or severe pain.     Marland Kitchen. levothyroxine (SYNTHROID, LEVOTHROID) 100 MCG tablet TAKE 1 TABLET (100 MCG TOTAL) BY MOUTH DAILY. 90 tablet 3  . meclizine (ANTIVERT) 25 MG tablet Take 1 tablet (25 mg total) by mouth 3 (three) times daily as needed for dizziness. 30 tablet 0   No current facility-administered medications on file prior to visit.     Social History  Substance Use Topics  . Smoking status: Never Smoker  . Smokeless tobacco: Never Used     Comment: tobacco use - no  . Alcohol use No    Review of Systems  Constitutional: Negative for chills and fever.  Respiratory: Negative for cough.   Cardiovascular: Negative for chest pain and palpitations.  Gastrointestinal: Negative for nausea and vomiting.  Neurological: Negative for dizziness, syncope and headaches.      Objective:    BP 112/70 Comment: standing  Pulse 83   Temp 98 F (36.7 C) (Oral)  Ht 5\' 5"  (1.651 m)   Wt 207 lb (93.9 kg)   LMP 12/16/2015   SpO2 97%   BMI 34.45 kg/m  BP Readings from Last 3 Encounters:  08/21/16 112/70  08/19/16 103/58  08/16/16 143/92   Wt Readings from Last 3 Encounters:  08/21/16 207 lb (93.9 kg)  08/19/16 195 lb (88.5 kg)  08/16/16 195 lb (88.5 kg)    Physical Exam  Constitutional: She appears well-developed and well-nourished.  Eyes: Conjunctivae are normal.  Cardiovascular: Normal rate, regular rhythm, normal heart sounds and normal pulses.   Pulmonary/Chest: Effort normal and breath sounds normal. She has no wheezes. She has no rhonchi. She has no rales.  Neurological: She has normal strength. No cranial nerve deficit or sensory deficit. She displays a negative Romberg sign.  Reflex Scores:      Bicep reflexes are 2+  on the right side and 2+ on the left side.      Patellar reflexes are 2+ on the right side and 2+ on the left side. Grip equal and strong bilateral upper extremities. Gait strong and steady. Able to perform  finger-to-nose without difficulty.  Dix hall pike maneuver elicited vertigo. No nystagmus noted. Patient denied nausea during maneuver.    Skin: Skin is warm and dry.  Psychiatric: She has a normal mood and affect. Her speech is normal and behavior is normal. Thought content normal.  Vitals reviewed.      Assessment & Plan:   Problem List Items Addressed This Visit      Nervous and Auditory   Benign paroxysmal positional vertigo - Primary    Reassured by normal neurologic exam. Discussed at length with patient the causes of dizziness as well as my working diagnosis of BPPV.Advised patient to remain vigilant about triggers for dizziness as related to water intake, food and stress. Advised follow up appointment with ophthalmologist as suspect the computer screen is causing eye strain. I also advised patient to contact her neurosurgeon regarding evaluation of stimulator to ensure not affected by fall.Patient agreed with plan and would follow up with opthalmology and neurosurgery.             I am having [name] maintain her levothyroxine, baclofen, buPROPion, DULoxetine, gabapentin, HYDROmorphone, meclizine, and Oxycodone HCl.   Meds ordered this encounter  Medications  . Oxycodone HCl 10 MG TABS    Return precautions given.   Risks, benefits, and alternatives of the medications and treatment plan prescribed today were discussed, and patient expressed understanding.   Education regarding symptom management and diagnosis given to patient on AVS.  Continue to follow with Rennie PlowmanMargaret Nickolette Espinola, FNP for routine health maintenance.   [name] and I agreed with plan.   Rennie PlowmanMargaret Leman Martinek, FNP

## 2016-08-21 NOTE — Patient Instructions (Addendum)
Please make appointment with ophthalmologist. I suspect the computer screen is causing eye strain.   Please call neurosurgeon regarding evaluation of stimulator to ensure not affected by fall.  At this point, I suspect Benign paroxysmal positional vertigo (BPPV) and have included information from [facility] below.   You may look videos for Epley's maneuvers as we discussed online.   If dizziness/vertigo persists, a referral to ENT for further evaluation and medication may be appropriate.   If there is no improvement in your symptoms, or if there is any worsening of symptoms, or if you have any additional concerns, please return to this clinic for re-evaluation; or, if we are closed, consider going to the Emergency Room for evaluation.   What is BPPV?  BPPV  is one of the most common causes of vertigo - the sudden sensation that you're spinning or that the inside of your head is spinning. Benign paroxysmal positional vertigo causes brief episodes of mild to intense dizziness. Benign paroxysmal positional vertigo is usually triggered by specific changes in the position of your head. This might occur when you tip your head up or down, when you lie down, or when you turn over or sit up in bed. Although benign paroxysmal positional vertigo can be a bothersome problem, it's rarely serious except when it increases the chance of falls.  If you experience dizziness associated with benign paroxysmal positional vertigo (BPPV), consider these tips: Be aware of the possibility of losing your balance, which can lead to falling and serious injury.  Sit down immediately when you feel dizzy.  Use good lighting if you get up at night.  Walk with a cane for stability if you're at risk of falling.  Work closely with your doctor to manage your symptoms effectively. BPPV may recur even after successful therapy. Fortunately, although there's no cure, the condition can be managed with physical therapy and home  treatments.   Dizziness [Uncertain Cause] Dizziness is a common symptom sometimes described as "lightheadedness" or feeling like you are going to faint. If it lasts for only a few seconds and is related to changes in position (such as getting up after lying or sitting for a long time), it is usually not a sign of anything serious. Dizziness that lasts for minutes to hours, or comes on for no apparent reason, may be a sign of a more serious problem (such as dehydration, a medicine reaction, disease of the heart or brain). Today's exam did not show an exact cause for your dizzy spell . Sometimes additional tests are required before a cause can be found. Therefore, it is important to follow up with your doctor if your symptoms continue. Home Care: 1) If a dizzy spell occurs and lasts more than a few seconds, lie down until it passes. If you are lying down, then you cannot hurt yourself by falling if you do faint. 2) Do not drive or operate dangerous equipment until the dizzy spells have stopped for at least 48 hours. 3) If dizzy spells occur with sudden standing, this may be a sign of mild dehydration. Drink extra fluids over the next few days. 4) If you recently started a new medicine or if you had the dose of a current medicine increased (especially blood pressure medicine), talk with the prescribing doctor about your symptoms. Dose adjustments may be needed. Follow Up with your doctor for further evaluation within the next seven days, if your symptoms continue. Get Prompt Medical Attention if any of the following  occur: -- Worsening of your symptoms -- Fainting, headache or seizure -- Repeated vomiting -- Feeling like you or the room is spinning -- Chest, arm, neck, back or jaw pain -- Palpitations (the sense that your heart is fluttering or beating fast or hard) -- Shortness of breath -- Blood in vomit or stool (black or red color) -- Weakness of an arm or leg or one side of the face --  Difficulty with speech or vision  2000-2015 [employer] [address], [place], [place] [id number]. All rights reserved. This information is not intended as a substitute for professional medical care. Always follow your healthcare professional's instructions.

## 2016-08-21 NOTE — Assessment & Plan Note (Addendum)
Reassured by normal neurologic exam. Discussed at length with patient the causes of dizziness as well as my working diagnosis of BPPV.Advised patient to remain vigilant about triggers for dizziness as related to water intake, food and stress. Advised follow up appointment with ophthalmologist as suspect the computer screen is causing eye strain. I also advised patient to contact her neurosurgeon regarding evaluation of stimulator to ensure not affected by fall.Patient agreed with plan and would follow up with opthalmology and neurosurgery.

## 2016-08-26 ENCOUNTER — Telehealth: Payer: Self-pay | Admitting: Family

## 2016-08-26 DIAGNOSIS — H8110 Benign paroxysmal vertigo, unspecified ear: Secondary | ICD-10-CM

## 2016-08-26 NOTE — Telephone Encounter (Signed)
Please advise 

## 2016-08-26 NOTE — Telephone Encounter (Signed)
Pt called and asked if we could put in a referral for an ENT for her Vertigo. She stated that if we could get her into [facility] soon that would be great, if any where else that can see her the soonest. Please advise, thank you!  Call pt @ [phone]

## 2016-08-27 ENCOUNTER — Other Ambulatory Visit: Payer: Self-pay | Admitting: Family

## 2016-08-27 NOTE — Telephone Encounter (Signed)
Call pt. Referral placed

## 2016-08-27 NOTE — Telephone Encounter (Signed)
Patient has been notified via Mychart.

## 2016-08-29 DIAGNOSIS — H8111 Benign paroxysmal vertigo, right ear: Secondary | ICD-10-CM | POA: Diagnosis not present

## 2016-08-29 DIAGNOSIS — E041 Nontoxic single thyroid nodule: Secondary | ICD-10-CM | POA: Diagnosis not present

## 2016-09-05 DIAGNOSIS — H8112 Benign paroxysmal vertigo, left ear: Secondary | ICD-10-CM | POA: Diagnosis not present

## 2016-09-16 DIAGNOSIS — R42 Dizziness and giddiness: Secondary | ICD-10-CM | POA: Diagnosis not present

## 2016-09-16 DIAGNOSIS — H8113 Benign paroxysmal vertigo, bilateral: Secondary | ICD-10-CM | POA: Diagnosis not present

## 2016-09-17 ENCOUNTER — Telehealth: Payer: Self-pay | Admitting: Family

## 2016-09-17 NOTE — Telephone Encounter (Signed)
Pt called and stated that she would like to M. Arnett to call in some medication for her. She is c/o cough, chest congestion, and stuffy nose, and green phlegm. Please advise, thank you!  Pharmacy - [facility] [id number] - [place], [place] - [phone] [address]  Call pt @ [phone]

## 2016-09-17 NOTE — Telephone Encounter (Signed)
Patient advised of below and verbalized an understanding  

## 2016-09-17 NOTE — Telephone Encounter (Addendum)
  Symptoms: cough with  Green mucus , chest congestion,stuff nose, no fever  Duration since Saturday Medications: Robitussin,  No appointments available advised patient may have to go to urgent care.   Patient would like to see if you if you would call you call her in something.   Please advise.

## 2016-09-17 NOTE — Telephone Encounter (Signed)
i'm sorry but she needs to be seen. I have not seen her for this issue.  Advise plain mucinex in meantime

## 2016-10-29 ENCOUNTER — Ambulatory Visit (INDEPENDENT_AMBULATORY_CARE_PROVIDER_SITE_OTHER): Payer: BLUE CROSS/BLUE SHIELD | Admitting: Family

## 2016-10-29 ENCOUNTER — Encounter: Payer: Self-pay | Admitting: Family

## 2016-10-29 VITALS — BP 122/68 | HR 96 | Temp 97.8°F | Ht 65.0 in | Wt 180.8 lb

## 2016-10-29 DIAGNOSIS — H8110 Benign paroxysmal vertigo, unspecified ear: Secondary | ICD-10-CM | POA: Diagnosis not present

## 2016-10-29 DIAGNOSIS — R251 Tremor, unspecified: Secondary | ICD-10-CM | POA: Diagnosis not present

## 2016-10-29 DIAGNOSIS — E039 Hypothyroidism, unspecified: Secondary | ICD-10-CM | POA: Diagnosis not present

## 2016-10-29 LAB — MAGNESIUM: MAGNESIUM: 2 mg/dL (ref 1.5–2.5)

## 2016-10-29 LAB — COMPREHENSIVE METABOLIC PANEL
ALBUMIN: 3.4 g/dL — AB (ref 3.5–5.2)
ALK PHOS: 57 U/L (ref 39–117)
ALT: 14 U/L (ref 0–35)
AST: 21 U/L (ref 0–37)
BILIRUBIN TOTAL: 0.3 mg/dL (ref 0.2–1.2)
BUN: 14 mg/dL (ref 6–23)
CO2: 31 mEq/L (ref 19–32)
CREATININE: 0.81 mg/dL (ref 0.40–1.20)
Calcium: 9 mg/dL (ref 8.4–10.5)
Chloride: 107 mEq/L (ref 96–112)
GFR: 79.4 mL/min (ref >60.00)
Glucose, Bld: 99 mg/dL (ref 70–99)
Potassium: 4.9 mEq/L (ref 3.5–5.1)
SODIUM: 141 meq/L (ref 135–145)
TOTAL PROTEIN: 6.6 g/dL (ref 6.0–8.3)

## 2016-10-29 LAB — CBC WITH DIFFERENTIAL/PLATELET
Basophils Absolute: 0.1 10*3/uL (ref 0.0–0.1)
Basophils Relative: 1.3 % (ref 0.0–3.0)
EOS ABS: 0.2 10*3/uL (ref 0.0–0.7)
Eosinophils Relative: 2.6 % (ref 0.0–5.0)
HCT: 34.3 % — ABNORMAL LOW (ref 36.0–46.0)
HEMOGLOBIN: 11 g/dL — AB (ref 12.0–15.0)
Lymphocytes Relative: 23.6 % (ref 12.0–46.0)
Lymphs Abs: 1.7 10*3/uL (ref 0.7–4.0)
MCHC: 31.9 g/dL (ref 30.0–36.0)
MCV: 84.2 fl (ref 78.0–100.0)
MONO ABS: 0.4 10*3/uL (ref 0.1–1.0)
Monocytes Relative: 5.8 % (ref 3.0–12.0)
Neutro Abs: 4.8 10*3/uL (ref 1.4–7.7)
Neutrophils Relative %: 66.7 % (ref 43.0–77.0)
Platelets: 385 10*3/uL (ref 150.0–400.0)
RBC: 4.07 Mil/uL (ref 3.87–5.11)
RDW: 16.8 % — ABNORMAL HIGH (ref 11.5–15.5)
WBC: 7.2 10*3/uL (ref 4.0–10.5)

## 2016-10-29 LAB — TSH: TSH: 0.7 u[IU]/mL (ref 0.35–4.50)

## 2016-10-29 LAB — B12 AND FOLATE PANEL
FOLATE: 15.4 ng/mL (ref >5.9)
Vitamin B-12: 797 pg/mL (ref 211–911)

## 2016-10-29 NOTE — Assessment & Plan Note (Signed)
Reassured by no gross neurologic deficits. Resolves with intention. Pending labs. CT head ( due to hardware in back, concerned for MRI at this time).

## 2016-10-29 NOTE — Assessment & Plan Note (Signed)
Pending TSH. Suspect tremor may be r/t to hyperthryoidism.

## 2016-10-29 NOTE — Progress Notes (Signed)
Pre visit review using our clinic review tool, if applicable. No additional management support is needed unless otherwise documented below in the visit note. 

## 2016-10-29 NOTE — Assessment & Plan Note (Signed)
Becoming more frequent. Pending CT head. Close follow up, one month

## 2016-10-29 NOTE — Patient Instructions (Signed)
Ct head  Labs today  Follow up one  Month

## 2016-10-29 NOTE — Progress Notes (Signed)
Subjective:    Patient ID: Cheryl StainMelissa Carol Stcyr, female    DOB: 1966/07/06, 51 y.o.   MRN: [id number]  CC: Cheryl StainMelissa Carol Becker is a 51 y.o. female who presents today for an acute visit.    HPI: CC: right hand shaking over the past several months, daily, worsening 'as becoming more noticeable.'  Left also shakes, just not as bad as my right. Notices it while writing. Describes hand cramping up prior. No numbness, tingling.  Worse in mornings.   Vertigo becoming more frequent; triggered with dropping head, turning head. Self limiting. No ear pain, pressure or hearing loss. No severe HA, changes in vision.   Follows with dr marks for chronic pain management. No new medicaiton changes.   Notes intentional weight loss through exercise, decreased carbs.    HISTORY:  Past Medical History:  Diagnosis Date  . Anxiety and depression   . DM2 (diabetes mellitus, type 2) (HCC)   . HLD (hyperlipidemia)   . HTN (hypertension)   . Hypothyroid   . Obesity    Past Surgical History:  Procedure Laterality Date  . Abd US - gallstones  5/03  . BACK SURGERY  2009   decompressive laminectomy  . BARIATRIC SURGERY  03/02/12   gastric bypass  . CHOLECYSTECTOMY  6/03  . DILATION AND CURETTAGE OF UTERUS  1/05  . KNEE SURGERY     L - arthroscopic  . LIVER BIOPSY  6/03   fatty liver  . LUMBAR SPINE SURGERY  2015   Dr. Amalia Greenhouseehmig  . NASAL SINUS SURGERY    . PANNICULECTOMY  08/2013   [facility], Dr. Diamond NickelErdman  . sleep study - apnea  8/02   Family History  Problem Relation Age of Onset  . Cancer Father     Allergies: Glipizide; Lyrica [pregabalin]; Metformin; Penicillins; and Adhesive [tape] Current Outpatient Prescriptions on File Prior to Visit  Medication Sig Dispense Refill  . baclofen (LIORESAL) 10 MG tablet Take 10 mg by mouth daily.    Marland Kitchen. buPROPion (WELLBUTRIN SR) 200 MG 12 hr tablet Take 200 mg by mouth 2 (two) times daily.    . DULoxetine (CYMBALTA) 60 MG capsule Take 60 mg by mouth 2 (two) times  daily.    Marland Kitchen. gabapentin (NEURONTIN) 400 MG capsule Take 400 mg by mouth 4 (four) times daily.    Marland Kitchen. HYDROmorphone (DILAUDID) 4 MG tablet Take 4 mg by mouth 4 (four) times daily as needed for moderate pain or severe pain.     Marland Kitchen. levothyroxine (SYNTHROID, LEVOTHROID) 100 MCG tablet TAKE 1 TABLET (100 MCG TOTAL) BY MOUTH DAILY. 90 tablet 3  . Oxycodone HCl 10 MG TABS      No current facility-administered medications on file prior to visit.     Social History  Substance Use Topics  . Smoking status: Never Smoker  . Smokeless tobacco: Never Used     Comment: tobacco use - no  . Alcohol use No    Review of Systems  Constitutional: Negative for chills and fever.  HENT: Negative for congestion and hearing loss.   Eyes: Negative for visual disturbance.  Respiratory: Negative for cough.   Cardiovascular: Negative for chest pain and palpitations.  Gastrointestinal: Negative for nausea and vomiting.  Neurological: Positive for tremors. Negative for syncope and headaches.      Objective:    BP 122/68   Pulse 96   Temp 97.8 F (36.6 C) (Oral)   Ht 5\' 5"  (1.651 m)   Wt 180 lb 12.8  oz (82 kg)   SpO2 98%   BMI 30.09 kg/m    Physical Exam  Constitutional: She appears well-developed and well-nourished.  HENT:  Mouth/Throat: Uvula is midline, oropharynx is clear and moist and mucous membranes are normal.  Eyes: Conjunctivae and EOM are normal. Pupils are equal, round, and reactive to light.  Fundus normal bilaterally.   Neck: No thyroid mass and no thyromegaly present.  Cardiovascular: Normal rate, regular rhythm, normal heart sounds and normal pulses.   Pulmonary/Chest: Effort normal and breath sounds normal. She has no wheezes. She has no rhonchi. She has no rales.  Neurological: She is alert. She has normal strength. No cranial nerve deficit or sensory deficit. She displays a negative Romberg sign.  Reflex Scores:      Bicep reflexes are 2+ on the right side and 2+ on the left side.       Patellar reflexes are 2+ on the right side and 2+ on the left side. Grip equal and strong bilateral upper extremities. Gait strong and steady. Able to perform rapid alternating movement, missed nose with left hand on couple of occasions. Fine resting tremor noted in right hand. Tremor resolves with intention.  Patient supine for dix hall pike, unable to complete test as vertigo started.  Skin: Skin is warm and dry.  Psychiatric: She has a normal mood and affect. Her speech is normal and behavior is normal. Thought content normal.  Vitals reviewed.      Assessment & Plan:   Problem List Items Addressed This Visit      Endocrine   Hypothyroidism    Pending TSH. Suspect tremor may be r/t to hyperthryoidism.       Relevant Orders   Comprehensive metabolic panel   CBC with Differential/Platelet   TSH   Magnesium   B12 and Folate Panel     Nervous and Auditory   Benign paroxysmal positional vertigo    Becoming more frequent. Pending CT head. Close follow up, one month        Other   Tremor - Primary    Reassured by no gross neurologic deficits. Resolves with intention. Pending labs. CT head ( due to hardware in back, concerned for MRI at this time).       Relevant Orders   Comprehensive metabolic panel   CBC with Differential/Platelet   TSH   Magnesium   B12 and Folate Panel   CT Head Wo Contrast         I have discontinued [name]'s meclizine. I am also having her maintain her levothyroxine, baclofen, buPROPion, DULoxetine, gabapentin, HYDROmorphone, and Oxycodone HCl.   No orders of the defined types were placed in this encounter.   Return precautions given.   Risks, benefits, and alternatives of the medications and treatment plan prescribed today were discussed, and patient expressed understanding.   Education regarding symptom management and diagnosis given to patient on AVS.  Continue to follow with Rennie Plowman, FNP for routine health maintenance.    [name] and I agreed with plan.   Rennie Plowman, FNP

## 2016-10-30 ENCOUNTER — Other Ambulatory Visit: Payer: Self-pay | Admitting: Family

## 2016-10-30 DIAGNOSIS — D649 Anemia, unspecified: Secondary | ICD-10-CM

## 2016-10-30 DIAGNOSIS — Z1211 Encounter for screening for malignant neoplasm of colon: Secondary | ICD-10-CM

## 2016-10-31 ENCOUNTER — Encounter: Payer: Self-pay | Admitting: Family

## 2016-11-06 DIAGNOSIS — G894 Chronic pain syndrome: Secondary | ICD-10-CM | POA: Diagnosis not present

## 2016-11-06 DIAGNOSIS — F331 Major depressive disorder, recurrent, moderate: Secondary | ICD-10-CM | POA: Diagnosis not present

## 2016-11-06 DIAGNOSIS — M5416 Radiculopathy, lumbar region: Secondary | ICD-10-CM | POA: Diagnosis not present

## 2016-11-10 ENCOUNTER — Telehealth: Payer: Self-pay | Admitting: Family

## 2016-11-10 DIAGNOSIS — R251 Tremor, unspecified: Secondary | ICD-10-CM

## 2016-11-10 NOTE — Telephone Encounter (Signed)
Reason for call: productive cough Symptoms: productive cough green cough up green phlegm, no fever  Duration since Friday Medications:none, please advise what other counter medication she can use  Last seen for this problem: no office visit Seen by:

## 2016-11-10 NOTE — Telephone Encounter (Signed)
Pt called and stated that she has developed a productive cough on Friday. Pt would like to know if there is anything OTC. Please advise, thank you!  Call pt @ [phone]

## 2016-11-11 NOTE — Telephone Encounter (Signed)
Left message for patient to return call back.  

## 2016-11-11 NOTE — Telephone Encounter (Signed)
Please call pt at [phone]

## 2016-11-12 NOTE — Telephone Encounter (Signed)
Spoke to patient and scheduled appointment for tomorrow to be evaluated.

## 2016-11-13 ENCOUNTER — Encounter: Payer: Self-pay | Admitting: Family

## 2016-11-13 ENCOUNTER — Ambulatory Visit (INDEPENDENT_AMBULATORY_CARE_PROVIDER_SITE_OTHER): Payer: BLUE CROSS/BLUE SHIELD | Admitting: Family

## 2016-11-13 VITALS — BP 118/66 | HR 81 | Temp 98.2°F | Resp 16 | Wt 189.5 lb

## 2016-11-13 DIAGNOSIS — R0981 Nasal congestion: Secondary | ICD-10-CM

## 2016-11-13 NOTE — Progress Notes (Signed)
Subjective:    Patient ID: [name], female    DOB: [birth year], 51 y.o.   MRN: [id number]  CC: [name] is a 51 y.o. female who presents today for an acute visit.    HPI: CC: productive cough x 6 days, unchanged. Here for 'reassurance' to take OTC. Endorses HA this morning, took excredrin with relief.  Episode of gagging this morning, non bloody. Hasn't tried anything OTC. No wheezing, SOB, fever, chills, sinus pressure, sore throat.   No  Lung disease.     HISTORY:  Past Medical History:  Diagnosis Date  . Anxiety and depression   . DM2 (diabetes mellitus, type 2) (HCC)   . HLD (hyperlipidemia)   . HTN (hypertension)   . Hypothyroid   . Obesity    Past Surgical History:  Procedure Laterality Date  . Abd [place] - gallstones  5/03  . BACK SURGERY  2009   decompressive laminectomy  . BARIATRIC SURGERY  03/02/12   gastric bypass  . CHOLECYSTECTOMY  6/03  . DILATION AND CURETTAGE OF UTERUS  1/05  . KNEE SURGERY     L - arthroscopic  . LIVER BIOPSY  6/03   fatty liver  . LUMBAR SPINE SURGERY  2015   Dr. Amalia Greenhouse  . NASAL SINUS SURGERY    . PANNICULECTOMY  08/2013   [facility], Dr. Diamond Nickel  . sleep study - apnea  8/02   Family History  Problem Relation Age of Onset  . Cancer Father     Allergies: Glipizide; Lyrica [pregabalin]; Metformin; Penicillins; and Adhesive [tape] Current Outpatient Prescriptions on File Prior to Visit  Medication Sig Dispense Refill  . baclofen (LIORESAL) 10 MG tablet Take 10 mg by mouth daily.    Marland Kitchen buPROPion (WELLBUTRIN SR) 200 MG 12 hr tablet Take 200 mg by mouth 2 (two) times daily.    . DULoxetine (CYMBALTA) 60 MG capsule Take 60 mg by mouth 2 (two) times daily.    Marland Kitchen gabapentin (NEURONTIN) 400 MG capsule Take 400 mg by mouth 4 (four) times daily.    Marland Kitchen HYDROmorphone (DILAUDID) 4 MG tablet Take 4 mg by mouth 4 (four) times daily as needed for moderate pain or severe pain.     Marland Kitchen levothyroxine (SYNTHROID, LEVOTHROID) 100 MCG  tablet TAKE 1 TABLET (100 MCG TOTAL) BY MOUTH DAILY. 90 tablet 3  . Oxycodone HCl 10 MG TABS      No current facility-administered medications on file prior to visit.     Social History  Substance Use Topics  . Smoking status: Never Smoker  . Smokeless tobacco: Never Used     Comment: tobacco use - no  . Alcohol use No    Review of Systems  Constitutional: Negative for chills and fever.  HENT: Positive for congestion. Negative for ear pain, sinus pain, sinus pressure and sore throat.   Respiratory: Positive for cough. Negative for shortness of breath and wheezing.   Cardiovascular: Negative for chest pain and palpitations.  Gastrointestinal: Negative for nausea and vomiting.  Neurological: Negative for headaches (resolved).      Objective:    BP 118/66 (BP Location: Left Arm, Patient Position: Sitting, Cuff Size: Large)   Pulse 81   Temp 98.2 F (36.8 C) (Oral)   Resp 16   Wt 189 lb 8 oz (86 kg)   SpO2 98%   BMI 31.53 kg/m    Physical Exam  Constitutional: She appears well-developed and well-nourished.  HENT:  Head: Normocephalic and atraumatic.  Right Ear: Hearing, tympanic membrane, external ear and ear canal normal. No drainage, swelling or tenderness. No foreign bodies. Tympanic membrane is not erythematous and not bulging. No middle ear effusion. No decreased hearing is noted.  Left Ear: Hearing, tympanic membrane, external ear and ear canal normal. No drainage, swelling or tenderness. No foreign bodies. Tympanic membrane is not erythematous and not bulging.  No middle ear effusion. No decreased hearing is noted.  Nose: Nose normal. No rhinorrhea. Right sinus exhibits no maxillary sinus tenderness and no frontal sinus tenderness. Left sinus exhibits no maxillary sinus tenderness and no frontal sinus tenderness.  Mouth/Throat: Uvula is midline, oropharynx is clear and moist and mucous membranes are normal. No oropharyngeal exudate, posterior oropharyngeal edema,  posterior oropharyngeal erythema or tonsillar abscesses.  Eyes: Conjunctivae are normal.  Cardiovascular: Regular rhythm, normal heart sounds and normal pulses.   Pulmonary/Chest: Effort normal and breath sounds normal. She has no wheezes. She has no rhonchi. She has no rales.  Lymphadenopathy:       Head (right side): No submental, no submandibular, no tonsillar, no preauricular, no posterior auricular and no occipital adenopathy present.       Head (left side): No submental, no submandibular, no tonsillar, no preauricular, no posterior auricular and no occipital adenopathy present.    She has no cervical adenopathy.  Neurological: She is alert.  Skin: Skin is warm and dry.  Psychiatric: She has a normal mood and affect. Her speech is normal and behavior is normal. Thought content normal.  Vitals reviewed.      Assessment & Plan:   1. Sinus congestion Symptoms unchanged. Afebrile. Patient has not tried anything over-the-counter at home. We jointly agreed likely viral etiology. She will start Mucinex; if no improvement in the next couple days, patient will let me know.   I am having [name] maintain her levothyroxine, baclofen, buPROPion, DULoxetine, gabapentin, HYDROmorphone, and Oxycodone HCl.   No orders of the defined types were placed in this encounter.   Return precautions given.   Risks, benefits, and alternatives of the medications and treatment plan prescribed today were discussed, and patient expressed understanding.   Education regarding symptom management and diagnosis given to patient on AVS.  Continue to follow with Rennie PlowmanMargaret Maryam Feely, FNP for routine health maintenance.   Cheryl StainMelissa Carol Duffy and I agreed with plan.   Rennie PlowmanMargaret Xandria Gallaga, FNP

## 2016-11-13 NOTE — Patient Instructions (Addendum)
As discussed, suspect viral  Start plain mucinex. -/'/''''''  Increase intake of clear fluids. Congestion is best treated by hydration, when mucus is wetter, it is thinner, less sticky, and easier to expel from the body, either through coughing up drainage, or by blowing your nose.   Get plenty of rest.   Use saline nasal drops and blow your nose frequently. Run a humidifier at night and elevate the head of the bed. Vicks Vapor rub will help with congestion and cough. Steam showers and sinus massage for congestion.   Use Acetaminophen or Ibuprofen as needed for fever or pain. Avoid second hand smoke. Even the smallest exposure will worsen symptoms.   You can also try a teaspoon of honey to see if this will help reduce cough. Throat lozenges can sometimes be beneficial as well.    This illness will typically last 7 - 10 days.   Please follow up with our clinic if you develop a fever greater than 101 F, symptoms worsen, or do not resolve in the next week.

## 2016-11-13 NOTE — Progress Notes (Signed)
Pre visit review using our clinic review tool, if applicable. No additional management support is needed unless otherwise documented below in the visit note. 

## 2016-11-14 ENCOUNTER — Ambulatory Visit: Payer: BLUE CROSS/BLUE SHIELD | Attending: Family

## 2016-11-21 ENCOUNTER — Encounter: Payer: Self-pay | Admitting: Family

## 2016-11-24 ENCOUNTER — Other Ambulatory Visit: Payer: Self-pay | Admitting: Family

## 2016-11-24 DIAGNOSIS — R251 Tremor, unspecified: Secondary | ICD-10-CM

## 2016-11-26 ENCOUNTER — Ambulatory Visit: Payer: BLUE CROSS/BLUE SHIELD | Admitting: Family

## 2016-12-16 ENCOUNTER — Ambulatory Visit: Payer: BLUE CROSS/BLUE SHIELD | Admitting: Family

## 2016-12-16 NOTE — Progress Notes (Deleted)
   Subjective:    Patient ID: [name], female    DOB: 07-20-1966, 51 y.o.   MRN: [id number]  CC: Shakiyah Cirilo is a 51 y.o. female who presents today for an acute visit.    HPI: CC: left arm tenderness      HISTORY:  Past Medical History:  Diagnosis Date  . Anxiety and depression   . DM2 (diabetes mellitus, type 2) (HCC)   . HLD (hyperlipidemia)   . HTN (hypertension)   . Hypothyroid   . Obesity    Past Surgical History:  Procedure Laterality Date  . Abd [place] - gallstones  5/03  . BACK SURGERY  2009   decompressive laminectomy  . BARIATRIC SURGERY  03/02/12   gastric bypass  . CHOLECYSTECTOMY  6/03  . DILATION AND CURETTAGE OF UTERUS  1/05  . KNEE SURGERY     L - arthroscopic  . LIVER BIOPSY  6/03   fatty liver  . LUMBAR SPINE SURGERY  2015   Dr. Amalia Greenhouse  . NASAL SINUS SURGERY    . PANNICULECTOMY  08/2013   [facility], Dr. Diamond Nickel  . sleep study - apnea  8/02   Family History  Problem Relation Age of Onset  . Cancer Father     Allergies: Glipizide; Lyrica [pregabalin]; Metformin; Penicillins; and Adhesive [tape] Current Outpatient Prescriptions on File Prior to Visit  Medication Sig Dispense Refill  . baclofen (LIORESAL) 10 MG tablet Take 10 mg by mouth daily.    Marland Kitchen buPROPion (WELLBUTRIN SR) 200 MG 12 hr tablet Take 200 mg by mouth 2 (two) times daily.    . DULoxetine (CYMBALTA) 60 MG capsule Take 60 mg by mouth 2 (two) times daily.    Marland Kitchen gabapentin (NEURONTIN) 400 MG capsule Take 400 mg by mouth 4 (four) times daily.    Marland Kitchen HYDROmorphone (DILAUDID) 4 MG tablet Take 4 mg by mouth 4 (four) times daily as needed for moderate pain or severe pain.     Marland Kitchen levothyroxine (SYNTHROID, LEVOTHROID) 100 MCG tablet TAKE 1 TABLET (100 MCG TOTAL) BY MOUTH DAILY. 90 tablet 3  . Oxycodone HCl 10 MG TABS      No current facility-administered medications on file prior to visit.     Social History  Substance Use Topics  . Smoking status: Never Smoker  . Smokeless  tobacco: Never Used     Comment: tobacco use - no  . Alcohol use No    Review of Systems    Objective:    There were no vitals taken for this visit.   Physical Exam     Assessment & Plan:      I am having [name] maintain her levothyroxine, baclofen, buPROPion, DULoxetine, gabapentin, HYDROmorphone, and Oxycodone HCl.   No orders of the defined types were placed in this encounter.   Return precautions given.   Risks, benefits, and alternatives of the medications and treatment plan prescribed today were discussed, and patient expressed understanding.   Education regarding symptom management and diagnosis given to patient on AVS.  Continue to follow with Rennie Plowman, FNP for routine health maintenance.   [name] and I agreed with plan.   Rennie Plowman, FNP

## 2017-01-01 DIAGNOSIS — R251 Tremor, unspecified: Secondary | ICD-10-CM | POA: Diagnosis not present

## 2017-01-12 ENCOUNTER — Encounter: Payer: Self-pay | Admitting: Family

## 2017-01-12 ENCOUNTER — Emergency Department: Payer: BLUE CROSS/BLUE SHIELD

## 2017-01-12 ENCOUNTER — Encounter: Payer: Self-pay | Admitting: Emergency Medicine

## 2017-01-12 ENCOUNTER — Telehealth: Payer: Self-pay | Admitting: Family

## 2017-01-12 ENCOUNTER — Emergency Department
Admission: EM | Admit: 2017-01-12 | Discharge: 2017-01-12 | Disposition: A | Discharge status: Home / Self Care | Payer: BLUE CROSS/BLUE SHIELD | Attending: Emergency Medicine | Admitting: Emergency Medicine

## 2017-01-12 DIAGNOSIS — T402X1A Poisoning by other opioids, accidental (unintentional), initial encounter: Secondary | ICD-10-CM | POA: Diagnosis not present

## 2017-01-12 DIAGNOSIS — E119 Type 2 diabetes mellitus without complications: Secondary | ICD-10-CM | POA: Diagnosis not present

## 2017-01-12 DIAGNOSIS — R000 Tachycardia, unspecified: Secondary | ICD-10-CM | POA: Diagnosis not present

## 2017-01-12 DIAGNOSIS — T40601A Poisoning by unspecified narcotics, accidental (unintentional), initial encounter: Secondary | ICD-10-CM | POA: Insufficient documentation

## 2017-01-12 DIAGNOSIS — I10 Essential (primary) hypertension: Secondary | ICD-10-CM | POA: Diagnosis not present

## 2017-01-12 DIAGNOSIS — Z79899 Other long term (current) drug therapy: Secondary | ICD-10-CM | POA: Diagnosis not present

## 2017-01-12 DIAGNOSIS — S79921A Unspecified injury of right thigh, initial encounter: Secondary | ICD-10-CM | POA: Diagnosis not present

## 2017-01-12 DIAGNOSIS — S3993XA Unspecified injury of pelvis, initial encounter: Secondary | ICD-10-CM | POA: Diagnosis not present

## 2017-01-12 DIAGNOSIS — R4182 Altered mental status, unspecified: Secondary | ICD-10-CM | POA: Diagnosis not present

## 2017-01-12 DIAGNOSIS — R0902 Hypoxemia: Secondary | ICD-10-CM | POA: Diagnosis not present

## 2017-01-12 HISTORY — DX: Other chronic pain: G89.29

## 2017-01-12 HISTORY — DX: Dorsalgia, unspecified: M54.9

## 2017-01-12 LAB — CBC WITH DIFFERENTIAL/PLATELET
BASOS ABS: 0 10*3/uL (ref 0–0.1)
Basophils Relative: 0 %
EOS ABS: 0 10*3/uL (ref 0–0.7)
EOS PCT: 0 %
HCT: 33.7 % — ABNORMAL LOW (ref 35.0–47.0)
HEMOGLOBIN: 10.9 g/dL — AB (ref 12.0–16.0)
LYMPHS ABS: 0.6 10*3/uL — AB (ref 1.0–3.6)
LYMPHS PCT: 5 %
MCH: 25.9 pg — AB (ref 26.0–34.0)
MCHC: 32.3 g/dL (ref 32.0–36.0)
MCV: 80 fL (ref 80.0–100.0)
Monocytes Absolute: 0.4 10*3/uL (ref 0.2–0.9)
Monocytes Relative: 3 %
NEUTROS PCT: 92 %
Neutro Abs: 10.4 10*3/uL — ABNORMAL HIGH (ref 1.4–6.5)
PLATELETS: 208 10*3/uL (ref 150–440)
RBC: 4.21 MIL/uL (ref 3.80–5.20)
RDW: 17.5 % — ABNORMAL HIGH (ref 11.5–14.5)
WBC: 11.5 10*3/uL — AB (ref 3.6–11.0)

## 2017-01-12 LAB — COMPREHENSIVE METABOLIC PANEL
ALT: 24 U/L (ref 14–54)
AST: 31 U/L (ref 15–41)
Albumin: 4.2 g/dL (ref 3.5–5.0)
Alkaline Phosphatase: 64 U/L (ref 38–126)
Anion gap: 10 (ref 5–15)
BUN: 33 mg/dL — ABNORMAL HIGH (ref 6–20)
CO2: 27 mmol/L (ref 22–32)
Calcium: 9.5 mg/dL (ref 8.9–10.3)
Chloride: 101 mmol/L (ref 101–111)
Creatinine, Ser: 0.97 mg/dL (ref 0.44–1.00)
GFR calc Af Amer: >60 mL/min (ref >60)
GFR calc non Af Amer: >60 mL/min (ref >60)
Glucose, Bld: 116 mg/dL — ABNORMAL HIGH (ref 65–99)
Potassium: 4.4 mmol/L (ref 3.5–5.1)
Sodium: 138 mmol/L (ref 135–145)
Total Bilirubin: 0.5 mg/dL (ref 0.3–1.2)
Total Protein: 8 g/dL (ref 6.5–8.1)

## 2017-01-12 LAB — URINALYSIS, COMPLETE (UACMP) WITH MICROSCOPIC
Bilirubin Urine: NEGATIVE
Glucose, UA: NEGATIVE mg/dL
Hgb urine dipstick: NEGATIVE
Ketones, ur: 5 mg/dL — AB
Leukocytes, UA: NEGATIVE
Nitrite: NEGATIVE
Protein, ur: NEGATIVE mg/dL
Specific Gravity, Urine: 1.019 (ref 1.005–1.030)
pH: 5 (ref 5.0–8.0)

## 2017-01-12 LAB — URINE DRUG SCREEN, QUALITATIVE (ARMC ONLY)
Amphetamines, Ur Screen: NOT DETECTED
BARBITURATES, UR SCREEN: NOT DETECTED
Benzodiazepine, Ur Scrn: NOT DETECTED
COCAINE METABOLITE, UR ~~LOC~~: NOT DETECTED
Cannabinoid 50 Ng, Ur ~~LOC~~: NOT DETECTED
MDMA (Ecstasy)Ur Screen: NOT DETECTED
METHADONE SCREEN, URINE: NOT DETECTED
OPIATE, UR SCREEN: POSITIVE — AB
PHENCYCLIDINE (PCP) UR S: NOT DETECTED
Tricyclic, Ur Screen: NOT DETECTED

## 2017-01-12 LAB — BLOOD GAS, ARTERIAL
Acid-Base Excess: 1.5 mmol/L (ref 0.0–2.0)
Bicarbonate: 27.6 mmol/L (ref 20.0–28.0)
FIO2: 0.28
O2 Saturation: 98.6 %
Patient temperature: 37
pCO2 arterial: 50 mmHg — ABNORMAL HIGH (ref 32.0–48.0)
pH, Arterial: 7.35 (ref 7.350–7.450)
pO2, Arterial: 122 mmHg — ABNORMAL HIGH (ref 83.0–108.0)

## 2017-01-12 LAB — TROPONIN I: Troponin I: <0.03 ng/mL (ref <0.03)

## 2017-01-12 LAB — TSH: TSH: 2.563 u[IU]/mL (ref 0.350–4.500)

## 2017-01-12 LAB — CK: Total CK: 178 U/L (ref 38–234)

## 2017-01-12 LAB — AMMONIA: Ammonia: 11 umol/L (ref 9–35)

## 2017-01-12 LAB — LIPASE, BLOOD: Lipase: 38 U/L (ref 11–51)

## 2017-01-12 LAB — LACTIC ACID, PLASMA: Lactic Acid, Venous: 1.2 mmol/L (ref 0.5–1.9)

## 2017-01-12 MED ORDER — SODIUM CHLORIDE 0.9 % IV BOLUS (SEPSIS)
1000.0000 mL | Freq: Once | INTRAVENOUS | Status: AC
  Administered 2017-01-12: 1000 mL via INTRAVENOUS

## 2017-01-12 MED ORDER — NALOXONE HCL 0.4 MG/ML IJ SOLN
0.1000 mg | Freq: Once | INTRAMUSCULAR | Status: AC
  Administered 2017-01-12: 0.1 mg via INTRAVENOUS
  Filled 2017-01-12: qty 1

## 2017-01-12 MED ORDER — IOPAMIDOL (ISOVUE-370) INJECTION 76%
75.0000 mL | Freq: Once | INTRAVENOUS | Status: AC | PRN
  Administered 2017-01-12: 75 mL via INTRAVENOUS

## 2017-01-12 MED ORDER — NALOXONE HCL 0.4 MG/ML IJ SOLN
0.0400 mg | Freq: Once | INTRAMUSCULAR | Status: AC
  Administered 2017-01-12: 0.04 mg via INTRAVENOUS
  Filled 2017-01-12: qty 1

## 2017-01-12 MED ORDER — NALOXONE HCL 2 MG/2ML IJ SOSY
PREFILLED_SYRINGE | INTRAMUSCULAR | Status: AC
  Filled 2017-01-12: qty 2

## 2017-01-12 NOTE — ED Notes (Signed)
Dr Pershing Proud aware pt still has bradypnea. sats WNL on 2 L. Pt wakes to voice easily and is oriented when asked questions. Remains drowsy

## 2017-01-12 NOTE — ED Notes (Signed)
Per pts significant other pt was found down on bathroom floor this morning. Wife remembers pt in bed at midnight but not sure when she got up or what happened. Pt does not remember getting up or falling. Drowsy at this time but responds to verbal. sats in mid 80s RA on arrival, increased with 2 L [place].  Mild asterixis or tremors when hands extended.

## 2017-01-12 NOTE — ED Notes (Signed)
Pt remains drowsy and asleep unless someone calls name. RR has ranges 6-12. Family reports pt has not woke unless someone comes in and wakes her.

## 2017-01-12 NOTE — ED Notes (Signed)
Pt placed on 2L via [place].  

## 2017-01-12 NOTE — Telephone Encounter (Signed)
Patient is currently at ED today.  Patient will return call at a later date.

## 2017-01-12 NOTE — ED Notes (Signed)
Patient transported to CT. Will obtain urine sample when pt returns

## 2017-01-12 NOTE — ED Notes (Signed)
Pt taken off o2 at this time, pt 95% on RA. NAD noted.

## 2017-01-12 NOTE — Telephone Encounter (Signed)
Please call pt I got results from a Webb Silversmith NP from 01/06/2017.   Is she a specialist or has pt established with new pcp?  From labs I see, she does need to be evaluated for her thyroid as it appears low.   Please advise her to make an appt with me so I can recheck thyroid labs.

## 2017-01-12 NOTE — ED Triage Notes (Signed)
Family reports finding patient passed out in the floor this morning, pt has been confused since, "mushed mouth", bilateral knee pain.

## 2017-01-12 NOTE — ED Notes (Signed)
Pt now awake and alert. Per dr Pershing Proud ok to drink. Family remains at bedside. Water given.

## 2017-01-12 NOTE — ED Provider Notes (Signed)
[facility] Emergency Department Provider Note  ____________________________________________   First MD Initiated Contact with Patient 01/12/17 [phone]     (approximate)  I have reviewed the triage vital signs and the nursing notes.   HISTORY  Chief Complaint Fall   HPI [name] is a 51 y.o. female with a history of anxiety, depression and chronic back pain with a spinal stimulator who is presenting to the emergency department with altered mental status. Per wife, who reportedly said the patient was last seen normal about midnight last night, found the patient on the floor this morning. The patient had slurred speech at the time and was very slow to respond. The patient denies any drinking or drug use. However, she does not remember how she got on the floor and why she was there. She is complaining of pain at this time to her right medial and distal femur. Denies any headache or neck pain. Says that she took her normal dose of Dilaudid yesterday without any additional doses. Patient says that she just in the neurologist last week for tremor that had been developing over the past several months. She had blood work that showed anemia which did not require blood transfusion. Patient does not report any intentional overdose. Unclear how long the patient was lying on the ground.   Past Medical History:  Diagnosis Date  . Anxiety and depression   . Chronic back pain   . DM2 (diabetes mellitus, type 2) (HCC)   . HLD (hyperlipidemia)   . HTN (hypertension)   . Hypothyroid   . Obesity     Patient Active Problem List   Diagnosis Date Noted  . Tremor 10/29/2016  . Benign paroxysmal positional vertigo 08/21/2016  . Dizziness 05/15/2015  . Right-sided low back pain without sciatica 10/27/2014  . Hyperpigmentation 05/05/2014  . Viral wart on finger 05/05/2014  . Gluteal pain 12/28/2013  . Elevated LFTs 12/13/2013  . Depression with anxiety 10/26/2012  .  Anemia 05/05/2012  . Hypothyroidism 10/28/2007  . HYPERCHOLESTEROLEMIA, PURE 05/21/2007    Past Surgical History:  Procedure Laterality Date  . Abd [place] - gallstones  5/03  . BACK SURGERY  2009   decompressive laminectomy  . BARIATRIC SURGERY  03/02/12   gastric bypass  . CHOLECYSTECTOMY  6/03  . DILATION AND CURETTAGE OF UTERUS  1/05  . KNEE SURGERY     L - arthroscopic  . LIVER BIOPSY  6/03   fatty liver  . LUMBAR SPINE SURGERY  2015   Dr. Amalia Greenhouse  . NASAL SINUS SURGERY    . PANNICULECTOMY  08/2013   [facility], Dr. Diamond Nickel  . sleep study - apnea  8/02    Prior to Admission medications   Medication Sig Start Date End Date Taking? Authorizing Provider  baclofen (LIORESAL) 10 MG tablet Take 10 mg by mouth daily. 07/28/16  Yes Historical Provider, MD  BIOTIN PO Take 1 capsule by mouth daily.   Yes Historical Provider, MD  buPROPion (WELLBUTRIN SR) 200 MG 12 hr tablet Take 200 mg by mouth 2 (two) times daily. 08/04/16  Yes Historical Provider, MD  Cholecalciferol (VITAMIN D-1000 MAX ST) 1000 units tablet Take 2,000 Units by mouth daily. 02/29/12  Yes Historical Provider, MD  DULoxetine (CYMBALTA) 60 MG capsule Take 60 mg by mouth 2 (two) times daily. 07/28/16  Yes Historical Provider, MD  gabapentin (NEURONTIN) 400 MG capsule Take 800 mg by mouth 2 (two) times daily.  07/31/16  Yes Historical Provider, MD  HYDROmorphone (DILAUDID) 4 MG tablet Take 4 mg by mouth 4 (four) times daily as needed for moderate pain or severe pain.  08/15/16  Yes Historical Provider, MD  levothyroxine (SYNTHROID, LEVOTHROID) 100 MCG tablet TAKE 1 TABLET (100 MCG TOTAL) BY MOUTH DAILY. 09/05/15  Yes Shelia Media, MD  Multiple Vitamin (MULTIVITAMIN) tablet Take 1 tablet by mouth daily.   Yes Historical Provider, MD    Allergies Glipizide; Lyrica [pregabalin]; Metformin; Penicillins; and Adhesive [tape]  Family History  Problem Relation Age of Onset  . Cancer Father     Social History Social History    Substance Use Topics  . Smoking status: Never Smoker  . Smokeless tobacco: Never Used     Comment: tobacco use - no  . Alcohol use No    Review of Systems  Constitutional: No fever/chills Eyes: No visual changes. ENT: No sore throat. Cardiovascular: Denies chest pain. Respiratory: Denies shortness of breath. Gastrointestinal: No abdominal pain.  No nausea, no vomiting.  No diarrhea.  No constipation. Genitourinary: Negative for dysuria. Musculoskeletal: Negative for back pain. Skin: Negative for rash. Neurological: Negative for headaches, focal weakness or numbness.   ____________________________________________   PHYSICAL EXAM:  VITAL SIGNS: ED Triage Vitals [01/12/17 0750]  Enc Vitals Group     BP (!) 144/80     Pulse Rate (!) 115     Resp 18     Temp 97.1 F (36.2 C)     Temp Source Oral     SpO2 (!) 86 %     Weight      Height      Head Circumference      Peak Flow      Pain Score      Pain Loc      Pain Edu?      Excl. in GC?     Constitutional: Alert and oriented.  in no acute distress.  Respirations about 9 breaths per minute. Initially hypoxic to 85% and placed on nasal cannula oxygen which resolved her oxygen saturation to 100%. The patient is drowsy but is easily aroused and responds to questions appropriately. Eyes: Conjunctivae are normal. PERRL. EOMI. Head: Atraumatic. Nose: No congestion/rhinnorhea. Mouth/Throat: Mucous membranes are moist.   Neck: No stridor.   Cardiovascular: Tachycardic, regular rhythm. Grossly normal heart sounds.   Respiratory: Normal respiratory effort.  No retractions. Lungs CTAB. Gastrointestinal: Soft and nontender. No distention.No CVA tenderness. Musculoskeletal: Mild tenderness to palpation to the right medial and distal femur without any deformity. No ligamentous laxity to the knees bilaterally. Dorsalis pedis pulses are present and equal bilaterally. Patient will arrange the knees and hips bilaterally to full range  of motion. Mild tenderness to the bilateral hips. Pelvis is stable.. Neurostimulator palpated to the right lower back without any overlying erythema or tenderness to palpation. There is no tenderness to the thoracic or lumbar spines. No deformity or step-off. Neurologic:  Normal speech and language. No gross focal neurologic deficits are appreciated. Mild asterixis. Skin:  Skin is warm, dry and intact. No rash noted.   ____________________________________________   LABS (all labs ordered are listed, but only abnormal results are displayed)  Labs Reviewed  CBC WITH DIFFERENTIAL/PLATELET - Abnormal; Notable for the following:       Result Value   WBC 11.5 (*)    Hemoglobin 10.9 (*)    HCT 33.7 (*)    MCH 25.9 (*)    RDW 17.5 (*)    Neutro Abs 10.4 (*)  Lymphs Abs 0.6 (*)    All other components within normal limits  COMPREHENSIVE METABOLIC PANEL  LIPASE, BLOOD  TROPONIN I  AMMONIA  LACTIC ACID, PLASMA  LACTIC ACID, PLASMA  URINALYSIS, COMPLETE (UACMP) WITH MICROSCOPIC  URINE DRUG SCREEN, QUALITATIVE ([facility] ONLY)  TSH  CK   ____________________________________________  EKG  ED ECG REPORT I, Arelia Longest, the attending physician, personally viewed and interpreted this ECG.   Date: 01/12/2017  EKG Time: 0750  Rate: 117  Rhythm: sinus tachycardia with PVC 2.  Axis: Normal  Intervals:none  ST&T Change: Machine read as ST elevation but this is likely due to the patient's static in the baseline. I do not see any ST elevation or depression. No abnormal T-wave inversion.  ____________________________________________  RADIOLOGY  DG Pelvis 1-2 Views (Final result)  Result time 01/12/17 08:42:39  Final result by Ulyses Southward, MD (01/12/17 08:42:39)           Narrative:   CLINICAL DATA: Found on bathroom floor this morning, altered mental status  EXAM: PELVIS - 1-2 VIEW  COMPARISON: None  FINDINGS: Osseous demineralization.  Prior lumbar  surgery.  Hip and SI joints symmetric.  No acute fracture, dislocation, or bone destruction.  Scattered pelvic phleboliths with additional nonspecific benign-appearing calcifications adjacent to the LEFT inferior pubic ramus.  IMPRESSION: No acute osseous abnormalities.   Electronically Signed By: Ulyses Southward M.D. On: 01/12/2017 08:42            DG Femur Min 2 Views Right (Final result)  Result time 01/12/17 08:43:34  Final result by Ulyses Southward, MD (01/12/17 08:43:34)           Narrative:   CLINICAL DATA: Found on bathroom floor this morning, altered mental status  EXAM: RIGHT FEMUR 2 VIEWS  COMPARISON: None  FINDINGS: Mild osseous demineralization.  Joint space narrowing RIGHT knee.  RIGHT hip joint space preserved.  No acute fracture, dislocation, bone destruction.  Superimposed artifacts.  IMPRESSION: Osseous demineralization with degenerative changes RIGHT knee.  No acute abnormalities.   Electronically Signed By: Ulyses Southward M.D. On: 01/12/2017 08:43          ____________________________________________   PROCEDURES  Procedure(s) performed:   Procedures  Critical Care performed:   ____________________________________________   INITIAL IMPRESSION / ASSESSMENT AND PLAN / ED COURSE  Pertinent labs & imaging results that were available during my care of the patient were reviewed by me and considered in my medical decision making (see chart for details).    ----------------------------------------- 12:35 PM on 01/12/2017 -----------------------------------------  After receiving 2 doses of Narcan, 0.04 and 0.1 mg respectively, the patient is awake and alert in a symptomatic. She says that she only had 2 doses of her 4 mg Dilaudid yesterday. She does not report any intentional overdose in an attempt to hurt herself. We discussed scaling back her Dilaudid dosing from 4 mg up to 6 times per day to 2 mg 4 times a day. The  patient is understanding of this plan and willing to comply. She has a follow point with her pain medicine specialist on May 3. She'll be discharged home at this time.     ____________________________________________   FINAL CLINICAL IMPRESSION(S) / ED DIAGNOSES  Opiate overdose     NEW MEDICATIONS STARTED DURING THIS VISIT:  New Prescriptions   No medications on file     Note:  This document was prepared using Dragon voice recognition software and may include unintentional dictation errors.    Myra Rude  Quavion Boule, MD 01/12/17 1236

## 2017-01-14 NOTE — Telephone Encounter (Signed)
Pt called back returning your call.   Call pt @ [phone]. Thank you!

## 2017-01-15 NOTE — Telephone Encounter (Signed)
Patient was informed of results.  Patient understood and no questions, comments, or concerns at this time. Alos the other NP is the Neurologist.

## 2017-01-20 ENCOUNTER — Ambulatory Visit (INDEPENDENT_AMBULATORY_CARE_PROVIDER_SITE_OTHER): Payer: BLUE CROSS/BLUE SHIELD | Admitting: Family

## 2017-01-20 ENCOUNTER — Encounter: Payer: Self-pay | Admitting: Family

## 2017-01-20 VITALS — BP 118/60 | HR 84 | Temp 98.0°F | Ht 65.0 in | Wt 188.4 lb

## 2017-01-20 DIAGNOSIS — D649 Anemia, unspecified: Secondary | ICD-10-CM | POA: Diagnosis not present

## 2017-01-20 DIAGNOSIS — E039 Hypothyroidism, unspecified: Secondary | ICD-10-CM | POA: Diagnosis not present

## 2017-01-20 DIAGNOSIS — T40601D Poisoning by unspecified narcotics, accidental (unintentional), subsequent encounter: Secondary | ICD-10-CM | POA: Diagnosis not present

## 2017-01-20 LAB — B12 AND FOLATE PANEL
Folate: 22.6 ng/mL (ref >5.9)
Vitamin B-12: 330 pg/mL (ref 211–911)

## 2017-01-20 LAB — T4, FREE: FREE T4: 0.89 ng/dL (ref 0.60–1.60)

## 2017-01-20 LAB — IBC PANEL
Iron: 27 ug/dL — ABNORMAL LOW (ref 42–145)
Saturation Ratios: 6 % — ABNORMAL LOW (ref 20.0–50.0)
Transferrin: 319 mg/dL (ref 212.0–360.0)

## 2017-01-20 LAB — T3, FREE: T3, Free: 3.6 pg/mL (ref 2.3–4.2)

## 2017-01-20 LAB — TSH: TSH: 1.53 u[IU]/mL (ref 0.35–4.50)

## 2017-01-20 NOTE — Progress Notes (Signed)
Pre visit review using our clinic review tool, if applicable. No additional management support is needed unless otherwise documented below in the visit note. 

## 2017-01-20 NOTE — Assessment & Plan Note (Signed)
Pending thyroid studies 

## 2017-01-20 NOTE — Patient Instructions (Addendum)
Low b12- advised to take 1000 mcg once per day.   Low thiamine - 100 mg daily  Labs today  Stool cards  Please ensure Dr Dawna PartMarks knows about syncopal episode and perhaps dilaudid being involved.

## 2017-01-20 NOTE — Progress Notes (Signed)
Subjective:    Patient ID: Cheryl StainMelissa Carol Liuzzi, female    DOB: 06/17/1966, 51 y.o.   MRN: [id number]  CC: Cheryl StainMelissa Carol Becker is a 51 y.o. female who presents today for follow up.   HPI: Follow up on anemia and  thyroid labs from neurology.   Anemia- no syncopal episodes since ER visit. No sob, dizziness.  Last menstrual cycle 12/2015. No blood seen in stool.  Hypothyroidism- compliant with medication. No cold/heat intolerance, palpitations.    12/2016 - Neurology- likely essential tremor.Low b12- advised to take 1000 mcg once per day. Low thiamine - 100 mg daily.  Denies alcohol use.   ED 01/02/2017- syncopal episode , suspect opioid overdose ; not intentional suspects dilaudid which is prescribed by Dr Dawna PartMarks.          HISTORY:  Past Medical History:  Diagnosis Date  . Anxiety and depression   . Chronic back pain   . DM2 (diabetes mellitus, type 2) (HCC)   . HLD (hyperlipidemia)   . HTN (hypertension)   . Hypothyroid   . Obesity    Past Surgical History:  Procedure Laterality Date  . Abd US - gallstones  5/03  . BACK SURGERY  2009   decompressive laminectomy  . BARIATRIC SURGERY  03/02/12   gastric bypass  . CHOLECYSTECTOMY  6/03  . DILATION AND CURETTAGE OF UTERUS  1/05  . KNEE SURGERY     L - arthroscopic  . LIVER BIOPSY  6/03   fatty liver  . LUMBAR SPINE SURGERY  2015   Dr. Amalia Greenhouseehmig  . NASAL SINUS SURGERY    . PANNICULECTOMY  08/2013   [facility], Dr. Diamond NickelErdman  . sleep study - apnea  8/02   Family History  Problem Relation Age of Onset  . Cancer Father     Allergies: Glipizide; Hydrocodone-acetaminophen; Lyrica [pregabalin]; Metformin; Penicillins; Adhesive [tape]; and Tapentadol Current Outpatient Prescriptions on File Prior to Visit  Medication Sig Dispense Refill  . baclofen (LIORESAL) 10 MG tablet Take 10 mg by mouth daily.    Marland Kitchen. BIOTIN PO Take 1 capsule by mouth daily.    Marland Kitchen. buPROPion (WELLBUTRIN SR) 200 MG 12 hr tablet Take 200 mg by mouth 2 (two)  times daily.    . Cholecalciferol (VITAMIN D-1000 MAX ST) 1000 units tablet Take 2,000 Units by mouth daily.    . DULoxetine (CYMBALTA) 60 MG capsule Take 60 mg by mouth 2 (two) times daily.    Marland Kitchen. gabapentin (NEURONTIN) 400 MG capsule Take 800 mg by mouth 2 (two) times daily.     Marland Kitchen. HYDROmorphone (DILAUDID) 4 MG tablet Take 4 mg by mouth 4 (four) times daily as needed for moderate pain or severe pain.     Marland Kitchen. levothyroxine (SYNTHROID, LEVOTHROID) 100 MCG tablet TAKE 1 TABLET (100 MCG TOTAL) BY MOUTH DAILY. 90 tablet 3  . Multiple Vitamin (MULTIVITAMIN) tablet Take 1 tablet by mouth daily.     No current facility-administered medications on file prior to visit.     Social History  Substance Use Topics  . Smoking status: Never Smoker  . Smokeless tobacco: Never Used     Comment: tobacco use - no  . Alcohol use No    Review of Systems  Constitutional: Negative for chills and fever.  Respiratory: Negative for cough.   Cardiovascular: Negative for chest pain and palpitations.  Gastrointestinal: Negative for blood in stool, nausea and vomiting.  Neurological: Positive for syncope. Negative for dizziness and weakness.  Objective:    BP 118/60   Pulse 84   Temp 98 F (36.7 C) (Oral)   Ht 5\' 5"  (1.651 m)   Wt 188 lb 6.4 oz (85.5 kg)   SpO2 95%   BMI 31.35 kg/m  BP Readings from Last 3 Encounters:  01/20/17 118/60  01/12/17 107/67  11/13/16 118/66   Wt Readings from Last 3 Encounters:  01/20/17 188 lb 6.4 oz (85.5 kg)  11/13/16 189 lb 8 oz (86 kg)  10/29/16 180 lb 12.8 oz (82 kg)    Physical Exam  Constitutional: She appears well-developed and well-nourished.  Eyes: Conjunctivae are normal.  Cardiovascular: Normal rate, regular rhythm, normal heart sounds and normal pulses.   Pulmonary/Chest: Effort normal and breath sounds normal. She has no wheezes. She has no rhonchi. She has no rales.  Neurological: She is alert.  Skin: Skin is warm and dry.  Psychiatric: She has a  normal mood and affect. Her speech is normal and behavior is normal. Thought content normal.  Vitals reviewed.      Assessment & Plan:   Problem List Items Addressed This Visit      Endocrine   Hypothyroidism    Pending thyroid studies.      Relevant Orders   T3, free   T4, free   TSH     Other   Anemia - Primary    Normocytic. No source of bleeding. Neurology reports she is low in B12, thiamine. Patient denies any alcohol use. Advised her to start the supplements and follow-up in 3 months. Pending stool cards, colonoscopy to further evaluate for source of blood. No longer having menstrual cycle.      Relevant Orders   Ambulatory referral to Gastroenterology   Homocysteine   B12 and Folate Panel   IBC panel   Methylmalonic acid, serum   Intrinsic Factor Antibodies   Fecal occult blood, imunochemical   Narcotic overdose    Patient denies any intentional drug overdose. She still is not sure this is what happened. Reemphasized the importance of her discussing with this with her pain management physician, Dr. Dawna Part. She verbalized understanding and stated she would do so.          I am having [name] maintain her levothyroxine, baclofen, buPROPion, DULoxetine, gabapentin, HYDROmorphone, BIOTIN PO, Cholecalciferol, and multivitamin.   No orders of the defined types were placed in this encounter.   Return precautions given.   Risks, benefits, and alternatives of the medications and treatment plan prescribed today were discussed, and patient expressed understanding.   Education regarding symptom management and diagnosis given to patient on AVS.  Continue to follow with Allegra Grana, FNP for routine health maintenance.   [name] and I agreed with plan.   Rennie Plowman, FNP

## 2017-01-20 NOTE — Assessment & Plan Note (Signed)
Patient denies any intentional drug overdose. She still is not sure this is what happened. Reemphasized the importance of her discussing with this with her pain management physician, Dr. Dawna PartMarks. She verbalized understanding and stated she would do so.

## 2017-01-20 NOTE — Assessment & Plan Note (Signed)
Normocytic. No source of bleeding. Neurology reports she is low in B12, thiamine. Patient denies any alcohol use. Advised her to start the supplements and follow-up in 3 months. Pending stool cards, colonoscopy to further evaluate for source of blood. No longer having menstrual cycle.

## 2017-01-21 LAB — HOMOCYSTEINE: HOMOCYSTEINE: 8.1 umol/L (ref <10.4)

## 2017-01-22 LAB — INTRINSIC FACTOR ANTIBODIES: Intrinsic Factor: NEGATIVE

## 2017-01-23 LAB — METHYLMALONIC ACID, SERUM: Methylmalonic Acid, Quant: 279 nmol/L (ref 87–318)

## 2017-01-28 ENCOUNTER — Other Ambulatory Visit: Payer: Self-pay | Admitting: Family

## 2017-01-29 ENCOUNTER — Other Ambulatory Visit: Payer: Self-pay | Admitting: Family

## 2017-01-29 DIAGNOSIS — D649 Anemia, unspecified: Secondary | ICD-10-CM

## 2017-01-30 NOTE — Telephone Encounter (Signed)
Pt called requesting this refill. Pt is out of medication. Please advise, thank you!  Pharmacy - [facility] [phone][facility][id number] - [place] - [address]

## 2017-02-12 ENCOUNTER — Encounter: Payer: Self-pay | Admitting: Emergency Medicine

## 2017-02-12 ENCOUNTER — Emergency Department
Admission: EM | Admit: 2017-02-12 | Discharge: 2017-02-12 | Disposition: A | Discharge status: Home / Self Care | Payer: BLUE CROSS/BLUE SHIELD | Attending: Emergency Medicine | Admitting: Emergency Medicine

## 2017-02-12 DIAGNOSIS — R531 Weakness: Secondary | ICD-10-CM | POA: Insufficient documentation

## 2017-02-12 DIAGNOSIS — E119 Type 2 diabetes mellitus without complications: Secondary | ICD-10-CM | POA: Insufficient documentation

## 2017-02-12 DIAGNOSIS — I10 Essential (primary) hypertension: Secondary | ICD-10-CM | POA: Insufficient documentation

## 2017-02-12 DIAGNOSIS — R5383 Other fatigue: Secondary | ICD-10-CM | POA: Diagnosis present

## 2017-02-12 DIAGNOSIS — D508 Other iron deficiency anemias: Secondary | ICD-10-CM | POA: Diagnosis not present

## 2017-02-12 DIAGNOSIS — E039 Hypothyroidism, unspecified: Secondary | ICD-10-CM | POA: Diagnosis not present

## 2017-02-12 LAB — URINALYSIS, COMPLETE (UACMP) WITH MICROSCOPIC
BACTERIA UA: NONE SEEN
Bilirubin Urine: NEGATIVE
Glucose, UA: NEGATIVE mg/dL
HGB URINE DIPSTICK: NEGATIVE
KETONES UR: NEGATIVE mg/dL
LEUKOCYTES UA: NEGATIVE
NITRITE: NEGATIVE
PROTEIN: NEGATIVE mg/dL
Specific Gravity, Urine: 1.025 (ref 1.005–1.030)
pH: 5 (ref 5.0–8.0)

## 2017-02-12 LAB — BASIC METABOLIC PANEL
ANION GAP: 8 (ref 5–15)
BUN: 21 mg/dL — ABNORMAL HIGH (ref 6–20)
CHLORIDE: 105 mmol/L (ref 101–111)
CO2: 26 mmol/L (ref 22–32)
Calcium: 9 mg/dL (ref 8.9–10.3)
Creatinine, Ser: 0.57 mg/dL (ref 0.44–1.00)
GFR calc Af Amer: >60 mL/min (ref >60)
GLUCOSE: 66 mg/dL (ref 65–99)
POTASSIUM: 3.8 mmol/L (ref 3.5–5.1)
Sodium: 139 mmol/L (ref 135–145)

## 2017-02-12 LAB — CBC
HEMATOCRIT: 33.8 % — AB (ref 35.0–47.0)
HEMOGLOBIN: 10.8 g/dL — AB (ref 12.0–16.0)
MCH: 25.7 pg — ABNORMAL LOW (ref 26.0–34.0)
MCHC: 32.1 g/dL (ref 32.0–36.0)
MCV: 80 fL (ref 80.0–100.0)
Platelets: 241 10*3/uL (ref 150–440)
RBC: 4.22 MIL/uL (ref 3.80–5.20)
RDW: 16.6 % — ABNORMAL HIGH (ref 11.5–14.5)
WBC: 5.9 10*3/uL (ref 3.6–11.0)

## 2017-02-12 MED ORDER — FERROUS SULFATE DRIED ER 160 (50 FE) MG PO TBCR: 160.0000 mg | EXTENDED_RELEASE_TABLET | ORAL | 6 refills | Status: DC

## 2017-02-12 NOTE — ED Provider Notes (Signed)
[facility] Emergency Department Provider Note       Time seen: ----------------------------------------- 2:38 PM on 02/12/2017 -----------------------------------------     I have reviewed the triage vital signs and the nursing notes.   HISTORY   Chief Complaint Fatigue    HPI Seher Sherri Mcarthy is a 51 y.o. female who presents to the ED for weakness and muscle cramping that started last night and is getting worse today. Patient states she has a history of muscle cramps and weakness. She has had a spinal stimulator since October and she states she does not feel like it is working properly and it still needs to be adjusted. She denies any recent illness, changes in her medicines or other complaints. Muscle cramps appear to be scattered in the upper and lower extremities.   Past Medical History:  Diagnosis Date  . Anxiety and depression   . Chronic back pain   . DM2 (diabetes mellitus, type 2) (HCC)   . HLD (hyperlipidemia)   . HTN (hypertension)   . Hypothyroid   . Obesity     Patient Active Problem List   Diagnosis Date Noted  . Narcotic overdose 01/12/2017  . Tremor 10/29/2016  . Benign paroxysmal positional vertigo 08/21/2016  . Dizziness 05/15/2015  . Right-sided low back pain without sciatica 10/27/2014  . Hyperpigmentation 05/05/2014  . Viral wart on finger 05/05/2014  . Gluteal pain 12/28/2013  . Elevated LFTs 12/13/2013  . Depression with anxiety 10/26/2012  . Anemia 05/05/2012  . Hypothyroidism 10/28/2007  . HYPERCHOLESTEROLEMIA, PURE 05/21/2007    Past Surgical History:  Procedure Laterality Date  . Abd [place] - gallstones  5/03  . BACK SURGERY  2009   decompressive laminectomy  . BARIATRIC SURGERY  03/02/12   gastric bypass  . CHOLECYSTECTOMY  6/03  . DILATION AND CURETTAGE OF UTERUS  1/05  . KNEE SURGERY     L - arthroscopic  . LIVER BIOPSY  6/03   fatty liver  . LUMBAR SPINE SURGERY  2015   Dr. Amalia Greenhouse  . NASAL SINUS  SURGERY    . PANNICULECTOMY  08/2013   [facility], Dr. Diamond Nickel  . sleep study - apnea  8/02    Allergies Glipizide; Hydrocodone-acetaminophen; Lyrica [pregabalin]; Metformin; Penicillins; Adhesive [tape]; and Tapentadol  Social History Social History  Substance Use Topics  . Smoking status: Never Smoker  . Smokeless tobacco: Never Used     Comment: tobacco use - no  . Alcohol use No    Review of Systems Constitutional: Negative for fever. Eyes: Negative for vision changes ENT:  Negative for congestion, sore throat Cardiovascular: Negative for chest pain. Respiratory: Negative for shortness of breath. Gastrointestinal: Negative for abdominal pain, vomiting and diarrhea. Genitourinary: Negative for dysuria. Musculoskeletal: Negative for back pain. Positive for muscle cramps Skin: Negative for rash. Neurological: Negative for headaches, focal weakness or numbness.  All systems negative/normal/unremarkable except as stated in the HPI  ____________________________________________   PHYSICAL EXAM:  VITAL SIGNS: ED Triage Vitals  Enc Vitals Group     BP 02/12/17 1224 (!) 146/66     Pulse Rate 02/12/17 1224 78     Resp 02/12/17 1224 16     Temp 02/12/17 1224 98.2 F (36.8 C)     Temp Source 02/12/17 1224 Oral     SpO2 02/12/17 1224 98 %     Weight 02/12/17 1225 185 lb (83.9 kg)     Height 02/12/17 1225 5\' 5"  (1.651 m)     Head Circumference --  Peak Flow --      Pain Score 02/12/17 1230 8     Pain Loc --      Pain Edu? --      Excl. in GC? --     Constitutional: Alert and oriented. Well appearing and in no distress. Eyes: Conjunctivae are normal. Normal extraocular movements. ENT   Head: Normocephalic and atraumatic.   Nose: No congestion/rhinnorhea.   Mouth/Throat: Mucous membranes are moist.   Neck: No stridor. Cardiovascular: Normal rate, regular rhythm. No murmurs, rubs, or gallops. Respiratory: Normal respiratory effort without tachypnea nor  retractions. Breath sounds are clear and equal bilaterally. No wheezes/rales/rhonchi. Gastrointestinal: Soft and nontender. Normal bowel sounds Musculoskeletal: Nontender with normal range of motion in extremities. No lower extremity tenderness nor edema. Neurologic:  Normal speech and language. No gross focal neurologic deficits are appreciated.  Skin:  Skin is warm, dry and intact. No rash noted. Psychiatric: Mood and affect are normal. Speech and behavior are normal.  ____________________________________________  EKG: Interpreted by me.Sinus rhythm with a short PR interval, rate of 71 bpm, normal QRS, normal Q-T. Normal axis.  ____________________________________________  ED COURSE:  Pertinent labs & imaging results that were available during my care of the patient were reviewed by me and considered in my medical decision making (see chart for details). Patient presents for weakness, we will assess with labs as indicated   Procedures ____________________________________________   LABS (pertinent positives/negatives)  Labs Reviewed  BASIC METABOLIC PANEL - Abnormal; Notable for the following:       Result Value   BUN 21 (*)    All other components within normal limits  CBC - Abnormal; Notable for the following:    Hemoglobin 10.8 (*)    HCT 33.8 (*)    MCH 25.7 (*)    RDW 16.6 (*)    All other components within normal limits  URINALYSIS, COMPLETE (UACMP) WITH MICROSCOPIC - Abnormal; Notable for the following:    Color, Urine YELLOW (*)    APPearance HAZY (*)    Squamous Epithelial / LPF 0-5 (*)    All other components within normal limits  CBG MONITORING, ED  ____________________________________________  FINAL ASSESSMENT AND PLAN  Weakness, iron deficiency anemia  Plan: Patient's labs were dictated above. Patient had presented for weakness of uncertain etiology. No abnormalities were found other than a chronic iron deficiency anemia. I will increase her iron tablets  and encouraged her to have a close outpatient follow-up with her spine physician.   Emily FilbertWilliams, Jamelle Noy E, MD   Note: This note was generated in part or whole with voice recognition software. Voice recognition is usually quite accurate but there are transcription errors that can and very often do occur. I apologize for any typographical errors that were not detected and corrected.     Emily FilbertWilliams, Chrisma Hurlock E, MD 02/12/17 [phone]

## 2017-02-12 NOTE — ED Triage Notes (Signed)
Pt with weakness and muscle cramping started last night and getting worse today. Pt appears pale in triage.

## 2017-02-23 ENCOUNTER — Encounter: Payer: Self-pay | Admitting: Family

## 2017-02-23 ENCOUNTER — Ambulatory Visit (INDEPENDENT_AMBULATORY_CARE_PROVIDER_SITE_OTHER): Payer: BLUE CROSS/BLUE SHIELD | Admitting: Family

## 2017-02-23 ENCOUNTER — Ambulatory Visit: Payer: BLUE CROSS/BLUE SHIELD | Admitting: Gastroenterology

## 2017-02-23 VITALS — BP 108/70 | HR 77 | Temp 98.3°F | Ht 65.0 in | Wt 181.2 lb

## 2017-02-23 DIAGNOSIS — R519 Headache, unspecified: Secondary | ICD-10-CM

## 2017-02-23 DIAGNOSIS — R51 Headache: Secondary | ICD-10-CM

## 2017-02-23 DIAGNOSIS — D509 Iron deficiency anemia, unspecified: Secondary | ICD-10-CM

## 2017-02-23 LAB — CBC WITH DIFFERENTIAL/PLATELET
BASOS PCT: 3.2 % — AB (ref 0.0–3.0)
Basophils Absolute: 0.2 10*3/uL — ABNORMAL HIGH (ref 0.0–0.1)
EOS ABS: 0.1 10*3/uL (ref 0.0–0.7)
EOS PCT: 1.7 % (ref 0.0–5.0)
HCT: 34.6 % — ABNORMAL LOW (ref 36.0–46.0)
HEMOGLOBIN: 10.8 g/dL — AB (ref 12.0–15.0)
LYMPHS ABS: 1.4 10*3/uL (ref 0.7–4.0)
Lymphocytes Relative: 27.8 % (ref 12.0–46.0)
MCHC: 31.3 g/dL (ref 30.0–36.0)
MCV: 81.5 fl (ref 78.0–100.0)
MONO ABS: 0.2 10*3/uL (ref 0.1–1.0)
Monocytes Relative: 5.1 % (ref 3.0–12.0)
NEUTROS ABS: 3 10*3/uL (ref 1.4–7.7)
Neutrophils Relative %: 62.2 % (ref 43.0–77.0)
PLATELETS: 288 10*3/uL (ref 150.0–400.0)
RBC: 4.24 Mil/uL (ref 3.87–5.11)
RDW: 17.1 % — AB (ref 11.5–15.5)
WBC: 4.9 10*3/uL (ref 4.0–10.5)

## 2017-02-23 LAB — IBC PANEL
IRON: 18 ug/dL — AB (ref 42–145)
Saturation Ratios: 3.8 % — ABNORMAL LOW (ref 20.0–50.0)
Transferrin: 339 mg/dL (ref 212.0–360.0)

## 2017-02-23 LAB — FERRITIN: FERRITIN: 6.2 ng/mL — AB (ref 10.0–291.0)

## 2017-02-23 MED ORDER — PROPRANOLOL HCL 10 MG PO TABS: 10.0000 mg | ORAL_TABLET | Freq: Two times a day (BID) | ORAL | 0 refills | Status: DC

## 2017-02-23 NOTE — Assessment & Plan Note (Addendum)
Repeating  Iron studies today. Recently increased dose from ER visit. Suspect symptoms are largely from IDA. Will follow. Pending colonoscopy with Dr Servando Snarewohl.

## 2017-02-23 NOTE — Progress Notes (Signed)
Subjective:    Patient ID: [name], female    DOB: [birth year], 51 y.o.   MRN: [id number]  CC: [name] is a 51 y.o. female who presents today for follow up.   HPI: ED follow up  Still feeling cold, tired. Cramps have resolved. No syncopal episodes since ED visit 01/12/17. Has met with Dr Allen Norris and will schedule colonoscopy   HA- started 2 months, unchanged. Occurs 3-4 times per week. May be from stress. describe headaches frontal; resolves with heated 'cornbag' and Excredrin with relief. No aura, vision changes, numbness in the face, photophobia.   ED 5/31 for weakness, muscle cramps increased iron to 160 mcg QOD.   Sees Dr Luetta Nutting psychiatry/ pain management.   No appointment with neurologist who manages nerve stimulator.       CT Head 12/2016 Negative for bleed, acute process  HISTORY:  Past Medical History:  Diagnosis Date  . Anxiety and depression   . Chronic back pain   . DM2 (diabetes mellitus, type 2) ([facility])   . HLD (hyperlipidemia)   . HTN (hypertension)   . Hypothyroid   . Obesity    Past Surgical History:  Procedure Laterality Date  . Abd [place] - gallstones  5/03  . BACK SURGERY  2009   decompressive laminectomy  . BARIATRIC SURGERY  03/02/2012   gastric bypass; Dr Darnell Level  . CHOLECYSTECTOMY  6/03  . DILATION AND CURETTAGE OF UTERUS  1/05  . KNEE SURGERY     L - arthroscopic  . LIVER BIOPSY  6/03   fatty liver  . LUMBAR SPINE SURGERY  2015   Dr. Mick Sell  . NASAL SINUS SURGERY    . PANNICULECTOMY  08/2013   [facility], Dr. Chrisandra Carota  . sleep study - apnea  8/02   Family History  Problem Relation Age of Onset  . Cancer Father     Allergies: Glipizide; Hydrocodone-acetaminophen; Lyrica [pregabalin]; Metformin; Penicillins; Adhesive [tape]; and Tapentadol Current Outpatient Prescriptions on File Prior to Visit  Medication Sig Dispense Refill  . baclofen (LIORESAL) 10 MG tablet Take 10 mg by mouth daily.    Marland Kitchen BIOTIN PO Take 1 capsule by  mouth daily.    Marland Kitchen buPROPion (WELLBUTRIN SR) 200 MG 12 hr tablet Take 200 mg by mouth 2 (two) times daily.    . Cholecalciferol (VITAMIN D-1000 MAX ST) 1000 units tablet Take 2,000 Units by mouth daily.    . DULoxetine (CYMBALTA) 60 MG capsule Take 60 mg by mouth 2 (two) times daily.    . ferrous sulfate (EQL SLOW RELEASE IRON) 160 (50 Fe) MG TBCR SR tablet Take 1 tablet (160 mg total) by mouth every other day. 30 each 6  . gabapentin (NEURONTIN) 400 MG capsule Take 800 mg by mouth 2 (two) times daily.     Marland Kitchen HYDROmorphone (DILAUDID) 4 MG tablet Take 4 mg by mouth 4 (four) times daily as needed for moderate pain or severe pain.     Marland Kitchen levothyroxine (SYNTHROID, LEVOTHROID) 100 MCG tablet TAKE 1 TABLET BY MOUTH EVERY DAY 30 tablet 2  . Multiple Vitamin (MULTIVITAMIN) tablet Take 1 tablet by mouth daily.     No current facility-administered medications on file prior to visit.     Social History  Substance Use Topics  . Smoking status: Never Smoker  . Smokeless tobacco: Never Used     Comment: tobacco use - no  . Alcohol use No    Review of Systems  Constitutional: Positive for fatigue.  Negative for chills and fever.  Respiratory: Negative for cough.   Cardiovascular: Negative for chest pain and palpitations.  Gastrointestinal: Negative for nausea and vomiting.  Endocrine: Positive for cold intolerance.  Neurological: Positive for headaches. Negative for dizziness and syncope.      Objective:    BP 108/70   Pulse 77   Temp 98.3 F (36.8 C) (Oral)   Ht 5' 5"  (1.651 m)   Wt 181 lb 3.2 oz (82.2 kg)   SpO2 96%   BMI 30.15 kg/m  BP Readings from Last 3 Encounters:  02/23/17 108/70  02/12/17 (!) 145/77  01/20/17 118/60   Wt Readings from Last 3 Encounters:  02/23/17 181 lb 3.2 oz (82.2 kg)  02/12/17 185 lb (83.9 kg)  01/20/17 188 lb 6.4 oz (85.5 kg)    Physical Exam  Constitutional: She appears well-developed and well-nourished.  HENT:  Mouth/Throat: Uvula is midline,  oropharynx is clear and moist and mucous membranes are normal.  Eyes: Conjunctivae and EOM are normal. Pupils are equal, round, and reactive to light.  Fundus normal bilaterally.   Cardiovascular: Normal rate, regular rhythm, normal heart sounds and normal pulses.   Pulmonary/Chest: Effort normal and breath sounds normal. She has no wheezes. She has no rhonchi. She has no rales.  Neurological: She is alert. She has normal strength. No cranial nerve deficit or sensory deficit. She displays a negative Romberg sign.  Reflex Scores:      Bicep reflexes are 2+ on the right side and 2+ on the left side.      Patellar reflexes are 2+ on the right side and 2+ on the left side. Grip equal and strong bilateral upper extremities. Gait strong and steady. Able to perform rapid alternating movement without difficulty.   Skin: Skin is warm and dry.  Psychiatric: She has a normal mood and affect. Her speech is normal and behavior is normal. Thought content normal.  Vitals reviewed.      Assessment & Plan:   Problem List Items Addressed This Visit      Other   Anemia - Primary    Repeating  Iron studies today. Recently increased dose from ER visit. Suspect symptoms are largely from [place]. Will follow. Pending colonoscopy with Dr Allen Norris.      Relevant Orders   Ferritin   IBC panel   CBC with Differential/Platelet   Nonintractable headache    New. HAs 3-4 times per week. Reassured by normal neurologic exam. We will start very small dose of propanolol as patient has lower end blood pressure. Follow-up in 8 weeks, sooner if no improvement.      Relevant Medications   propranolol (INDERAL) 10 MG tablet       I am having Ms. Agostinelli start on propranolol. I am also having her maintain her baclofen, buPROPion, DULoxetine, gabapentin, HYDROmorphone, BIOTIN PO, Cholecalciferol, multivitamin, levothyroxine, and ferrous sulfate.   Meds ordered this encounter  Medications  . propranolol (INDERAL) 10 MG  tablet    Sig: Take 1 tablet (10 mg total) by mouth 2 (two) times daily.    Dispense:  90 tablet    Refill:  0    Order Specific Question:   Supervising Provider    Answer:   Crecencio Mc [[id number]]    Return precautions given.   Risks, benefits, and alternatives of the medications and treatment plan prescribed today were discussed, and patient expressed understanding.   Education regarding symptom management and diagnosis given to patient on AVS.  Continue to follow with Burnard Hawthorne, FNP for routine health maintenance.   [name] and I agreed with plan.   Mable Paris, FNP

## 2017-02-23 NOTE — Progress Notes (Signed)
Pre visit review using our clinic review tool, if applicable. No additional management support is needed unless otherwise documented below in the visit note. 

## 2017-02-23 NOTE — Assessment & Plan Note (Signed)
New. HAs 3-4 times per week. Reassured by normal neurologic exam. We will start very small dose of propanolol as patient has lower end blood pressure. Follow-up in 8 weeks, sooner if no improvement.

## 2017-02-23 NOTE — Patient Instructions (Signed)
Labs   Follow up 8-12 weeks

## 2017-02-26 ENCOUNTER — Telehealth: Payer: Self-pay | Admitting: Family

## 2017-02-26 ENCOUNTER — Encounter: Payer: Self-pay | Admitting: Family

## 2017-02-26 NOTE — Telephone Encounter (Signed)
Attempted to call patient, left a vM to return my call, thanks

## 2017-02-26 NOTE — Telephone Encounter (Signed)
Pt called wanting more information on propranolol (INDERAL) 10 MG tablet. Please advise

## 2017-02-27 NOTE — Telephone Encounter (Signed)
attempted to reach the patient, left a VM

## 2017-02-27 NOTE — Telephone Encounter (Signed)
Patient requested a call [phone].

## 2017-03-02 NOTE — Telephone Encounter (Signed)
Patient called waiting on a reply from the my chart message that was sent on 6.14.18.

## 2017-03-05 DIAGNOSIS — F331 Major depressive disorder, recurrent, moderate: Secondary | ICD-10-CM | POA: Diagnosis not present

## 2017-03-05 DIAGNOSIS — M5416 Radiculopathy, lumbar region: Secondary | ICD-10-CM | POA: Diagnosis not present

## 2017-03-05 DIAGNOSIS — G894 Chronic pain syndrome: Secondary | ICD-10-CM | POA: Diagnosis not present

## 2017-03-10 ENCOUNTER — Telehealth: Payer: Self-pay | Admitting: *Deleted

## 2017-03-10 DIAGNOSIS — K912 Postsurgical malabsorption, not elsewhere classified: Secondary | ICD-10-CM | POA: Diagnosis not present

## 2017-03-10 DIAGNOSIS — D509 Iron deficiency anemia, unspecified: Secondary | ICD-10-CM

## 2017-03-10 DIAGNOSIS — Z9884 Bariatric surgery status: Secondary | ICD-10-CM | POA: Diagnosis not present

## 2017-03-10 DIAGNOSIS — Z5181 Encounter for therapeutic drug level monitoring: Secondary | ICD-10-CM | POA: Diagnosis not present

## 2017-03-10 NOTE — Telephone Encounter (Signed)
Patient was advised by Claris CheMargaret A to start iron infusions, after talking with her bariatric provider he advised pt to start infusions as soon as possible.  Pt contact [phone]  A message can be left on voicemail

## 2017-03-10 NOTE — Telephone Encounter (Signed)
FYI

## 2017-03-11 ENCOUNTER — Telehealth: Payer: Self-pay | Admitting: Family

## 2017-03-11 DIAGNOSIS — M5416 Radiculopathy, lumbar region: Secondary | ICD-10-CM | POA: Diagnosis not present

## 2017-03-11 NOTE — Telephone Encounter (Signed)
Pt called about needing a referral to the hematologist per Dr Delila PereyraJohn Michael Bruce in the [place] area. Thank you!  Call pt @ [phone].

## 2017-03-12 NOTE — Telephone Encounter (Signed)
Please advise 

## 2017-03-16 NOTE — Telephone Encounter (Signed)
Call pt  Let her know that I have placed a referral to hematology/oncology at cancer center- they will have to see her first and then can order iron infusions.

## 2017-03-17 NOTE — Telephone Encounter (Signed)
Patient has been informed and has no questions, comments, or concerns. 

## 2017-03-26 DIAGNOSIS — Z5181 Encounter for therapeutic drug level monitoring: Secondary | ICD-10-CM | POA: Diagnosis not present

## 2017-03-26 DIAGNOSIS — K912 Postsurgical malabsorption, not elsewhere classified: Secondary | ICD-10-CM | POA: Diagnosis not present

## 2017-03-26 DIAGNOSIS — Z9884 Bariatric surgery status: Secondary | ICD-10-CM | POA: Diagnosis not present

## 2017-04-01 ENCOUNTER — Ambulatory Visit (INDEPENDENT_AMBULATORY_CARE_PROVIDER_SITE_OTHER): Payer: BLUE CROSS/BLUE SHIELD | Admitting: Gastroenterology

## 2017-04-01 ENCOUNTER — Other Ambulatory Visit: Payer: Self-pay

## 2017-04-01 ENCOUNTER — Encounter: Payer: Self-pay | Admitting: Gastroenterology

## 2017-04-01 VITALS — BP 104/57 | HR 89 | Temp 98.3°F | Ht 65.0 in | Wt 191.0 lb

## 2017-04-01 DIAGNOSIS — D509 Iron deficiency anemia, unspecified: Secondary | ICD-10-CM | POA: Diagnosis not present

## 2017-04-01 NOTE — Progress Notes (Signed)
Gastroenterology Consultation  Referring Provider:     Allegra GranaArnett, Margaret G, FNP Primary Care Physician:  Allegra GranaArnett, Margaret G, FNP Primary Gastroenterologist:  Dr. Servando SnareWohl     Reason for Consultation:     Iron deficiency anemia        HPI:   [name] is a 51 y.o. y/o female referred for consultation & management of Iron deficiency anemia by Dr. Jason CoopArnett, Lyn RecordsMargaret G, FNP.  This patient was sent to me for iron deficiency anemia. The patient reports that she had a gastric bypass 3 years ago. The patient denies any black stools or bloody stools. The patient also denies any sign of any GI bleeding. The patient has not had any unexplained weight loss but has lost 120 pounds since her gastric bypass surgery. There is no report of any family history of colon cancer colon polyps. The patient states that she does not eat a lot and drinks a lot of protein shakes because of her gastric bypass surgery. The patient had her surgery by Dr. Smitty CordsBruce. I'm now asked to see the patient for her iron deficiency anemia.  Past Medical History:  Diagnosis Date  . Anxiety and depression   . Chronic back pain   . DM2 (diabetes mellitus, type 2) (HCC)   . HLD (hyperlipidemia)   . HTN (hypertension)   . Hypothyroid   . Obesity     Past Surgical History:  Procedure Laterality Date  . Abd US - gallstones  5/03  . BACK SURGERY  2009   decompressive laminectomy  . BARIATRIC SURGERY  03/02/2012   gastric bypass; Dr Smitty CordsBruce  . CHOLECYSTECTOMY  6/03  . DILATION AND CURETTAGE OF UTERUS  1/05  . KNEE SURGERY     L - arthroscopic  . LIVER BIOPSY  6/03   fatty liver  . LUMBAR SPINE SURGERY  2015   Dr. Amalia Greenhouseehmig  . NASAL SINUS SURGERY    . PANNICULECTOMY  08/2013   [facility], Dr. Diamond NickelErdman  . sleep study - apnea  8/02    Prior to Admission medications   Medication Sig Start Date End Date Taking? Authorizing Provider  baclofen (LIORESAL) 10 MG tablet Take 10 mg by mouth daily. 07/28/16  Yes [provider]    BIOTIN PO Take 1 capsule by mouth daily.   Yes [provider]  buPROPion (WELLBUTRIN SR) 200 MG 12 hr tablet Take 200 mg by mouth 2 (two) times daily. 08/04/16  Yes [provider]  Cholecalciferol (VITAMIN D-1000 MAX ST) 1000 units tablet Take 2,000 Units by mouth daily. 02/29/12  Yes [provider]  DULoxetine (CYMBALTA) 60 MG capsule Take 60 mg by mouth 2 (two) times daily. 07/28/16  Yes [provider]  ferrous sulfate (EQL SLOW RELEASE IRON) 160 (50 Fe) MG TBCR SR tablet Take 1 tablet (160 mg total) by mouth every other day. 02/12/17  Yes Emily FilbertWilliams, Jonathan E, MD  gabapentin (NEURONTIN) 400 MG capsule Take 800 mg by mouth 2 (two) times daily.  07/31/16  Yes [provider]  HYDROmorphone (DILAUDID) 4 MG tablet Take 4 mg by mouth 4 (four) times daily as needed for moderate pain or severe pain.  08/15/16  Yes [provider]  levothyroxine (SYNTHROID, LEVOTHROID) 100 MCG tablet TAKE 1 TABLET BY MOUTH EVERY DAY 01/30/17  Yes Arnett, Lyn RecordsMargaret G, FNP  Multiple Vitamin (MULTIVITAMIN) tablet Take 1 tablet by mouth daily.   Yes [provider]  propranolol (INDERAL) 10 MG tablet Take 1 tablet (  10 mg total) by mouth 2 (two) times daily. 02/23/17  Yes Allegra Grana, FNP    Family History  Problem Relation Age of Onset  . Cancer Father      Social History  Substance Use Topics  . Smoking status: Never Smoker  . Smokeless tobacco: Never Used     Comment: tobacco use - no  . Alcohol use No    Allergies as of 04/01/2017 - Review Complete 04/01/2017  Allergen Reaction Noted  . Glipizide Nausea And Vomiting 05/18/2007  . Hydrocodone-acetaminophen Itching 01/01/2017  . Lyrica [pregabalin]  11/22/2014  . Metformin Diarrhea 05/18/2007  . Penicillins Itching and Swelling 12/28/2007  . Adhesive [tape] Rash 08/19/2016  . Tapentadol Rash 08/19/2016    Review of Systems:    All systems reviewed and negative except where noted in  HPI.   Physical Exam:  BP (!) 104/57   Pulse 89   Temp 98.3 F (36.8 C) (Oral)   Ht 5\' 5"  (1.651 m)   Wt 191 lb (86.6 kg)   BMI 31.78 kg/m  No LMP recorded. Patient is not currently having periods (Reason: Perimenopausal). Psych:  Alert and cooperative. Normal mood and affect. General:   Alert,  Well-developed, well-nourished, pleasant and cooperative in NAD Head:  Normocephalic and atraumatic. Eyes:  Sclera clear, no icterus.   Conjunctiva pink. Ears:  Normal auditory acuity. Nose:  No deformity, discharge, or lesions. Mouth:  No deformity or lesions,oropharynx pink & moist. Neck:  Supple; no masses or thyromegaly. Lungs:  Respirations even and unlabored.  Clear throughout to auscultation.   No wheezes, crackles, or rhonchi. No acute distress. Heart:  Regular rate and rhythm; no murmurs, clicks, rubs, or gallops. Abdomen:  Normal bowel sounds.  No bruits.  Soft, non-tender and non-distended without masses, hepatosplenomegaly or hernias noted.  No guarding or rebound tenderness.  Negative Carnett sign.   Rectal:  Deferred.  Msk:  Symmetrical without gross deformities.  Good, equal movement & strength bilaterally. Pulses:  Normal pulses noted. Extremities:  No clubbing or edema.  No cyanosis. Neurologic:  Alert and oriented x3;  grossly normal neurologically. Skin:  Intact without significant lesions or rashes.  No jaundice. Lymph Nodes:  No significant cervical adenopathy. Psych:  Alert and cooperative. Normal mood and affect.  Imaging Studies: No results found.  Assessment and Plan:   [name] is a 51 y.o. y/o female who comes in with iron deficiency anemia without any source of GI bleeding. The patient had gastric bypass surgery in the past. The patient will be set up for an EGD and colonoscopy to look for source of her iron deficiency anemia. I have discussed risks & benefits which include, but are not limited to, bleeding, infection, perforation & drug reaction.   The patient agrees with this plan & written consent will be obtained.     Midge Minium, MD. Clementeen Graham   Note: This dictation was prepared with Dragon dictation along with smaller phrase technology. Any transcriptional errors that result from this process are unintentional.

## 2017-04-02 ENCOUNTER — Ambulatory Visit: Payer: BLUE CROSS/BLUE SHIELD | Admitting: Oncology

## 2017-04-12 ENCOUNTER — Other Ambulatory Visit: Payer: Self-pay | Admitting: Family

## 2017-04-12 DIAGNOSIS — R519 Headache, unspecified: Secondary | ICD-10-CM

## 2017-04-12 DIAGNOSIS — R51 Headache: Principal | ICD-10-CM

## 2017-04-14 DIAGNOSIS — M5416 Radiculopathy, lumbar region: Secondary | ICD-10-CM | POA: Diagnosis not present

## 2017-04-21 ENCOUNTER — Inpatient Hospital Stay: Payer: BLUE CROSS/BLUE SHIELD

## 2017-04-21 ENCOUNTER — Inpatient Hospital Stay: Payer: BLUE CROSS/BLUE SHIELD | Attending: Oncology | Admitting: Oncology

## 2017-04-21 ENCOUNTER — Encounter: Payer: Self-pay | Admitting: Oncology

## 2017-04-21 VITALS — BP 110/64 | HR 68 | Temp 96.9°F | Resp 18 | Ht 65.0 in | Wt 181.6 lb

## 2017-04-21 VITALS — BP 107/69 | HR 70 | Resp 18

## 2017-04-21 DIAGNOSIS — E785 Hyperlipidemia, unspecified: Secondary | ICD-10-CM

## 2017-04-21 DIAGNOSIS — R5383 Other fatigue: Secondary | ICD-10-CM | POA: Diagnosis not present

## 2017-04-21 DIAGNOSIS — Z79899 Other long term (current) drug therapy: Secondary | ICD-10-CM | POA: Diagnosis not present

## 2017-04-21 DIAGNOSIS — I10 Essential (primary) hypertension: Secondary | ICD-10-CM

## 2017-04-21 DIAGNOSIS — M549 Dorsalgia, unspecified: Secondary | ICD-10-CM | POA: Diagnosis not present

## 2017-04-21 DIAGNOSIS — Z9884 Bariatric surgery status: Secondary | ICD-10-CM | POA: Diagnosis not present

## 2017-04-21 DIAGNOSIS — H8110 Benign paroxysmal vertigo, unspecified ear: Secondary | ICD-10-CM | POA: Insufficient documentation

## 2017-04-21 DIAGNOSIS — E7800 Pure hypercholesterolemia, unspecified: Secondary | ICD-10-CM | POA: Diagnosis not present

## 2017-04-21 DIAGNOSIS — D509 Iron deficiency anemia, unspecified: Secondary | ICD-10-CM

## 2017-04-21 DIAGNOSIS — E119 Type 2 diabetes mellitus without complications: Secondary | ICD-10-CM | POA: Diagnosis not present

## 2017-04-21 DIAGNOSIS — Z800 Family history of malignant neoplasm of digestive organs: Secondary | ICD-10-CM | POA: Insufficient documentation

## 2017-04-21 DIAGNOSIS — F329 Major depressive disorder, single episode, unspecified: Secondary | ICD-10-CM | POA: Insufficient documentation

## 2017-04-21 DIAGNOSIS — E039 Hypothyroidism, unspecified: Secondary | ICD-10-CM | POA: Insufficient documentation

## 2017-04-21 DIAGNOSIS — R531 Weakness: Secondary | ICD-10-CM | POA: Insufficient documentation

## 2017-04-21 LAB — CBC WITH DIFFERENTIAL/PLATELET
BASOS ABS: 0 10*3/uL (ref 0–0.1)
BASOS PCT: 1 %
Eosinophils Absolute: 0.1 10*3/uL (ref 0–0.7)
Eosinophils Relative: 2 %
HEMATOCRIT: 32.9 % — AB (ref 35.0–47.0)
Hemoglobin: 10.8 g/dL — ABNORMAL LOW (ref 12.0–16.0)
LYMPHS PCT: 18 %
Lymphs Abs: 1.2 10*3/uL (ref 1.0–3.6)
MCH: 26.8 pg (ref 26.0–34.0)
MCHC: 32.8 g/dL (ref 32.0–36.0)
MCV: 81.7 fL (ref 80.0–100.0)
MONO ABS: 0.4 10*3/uL (ref 0.2–0.9)
Monocytes Relative: 6 %
NEUTROS ABS: 5 10*3/uL (ref 1.4–6.5)
NEUTROS PCT: 73 %
Platelets: 276 10*3/uL (ref 150–440)
RBC: 4.03 MIL/uL (ref 3.80–5.20)
RDW: 19.7 % — AB (ref 11.5–14.5)
WBC: 6.8 10*3/uL (ref 3.6–11.0)

## 2017-04-21 LAB — IRON AND TIBC
Iron: 36 ug/dL (ref 28–170)
SATURATION RATIOS: 9 % — AB (ref 10.4–31.8)
TIBC: 406 ug/dL (ref 250–450)
UIBC: 370 ug/dL

## 2017-04-21 LAB — FERRITIN: Ferritin: 6 ng/mL — ABNORMAL LOW (ref 11–307)

## 2017-04-21 MED ORDER — SODIUM CHLORIDE 0.9 % IV SOLN
510.0000 mg | Freq: Once | INTRAVENOUS | Status: AC
  Administered 2017-04-21: 510 mg via INTRAVENOUS
  Filled 2017-04-21: qty 17

## 2017-04-21 MED ORDER — SODIUM CHLORIDE 0.9 % IV SOLN
Freq: Once | INTRAVENOUS | Status: AC
  Administered 2017-04-21: 10:00:00 via INTRAVENOUS
  Filled 2017-04-21: qty 1000

## 2017-04-21 NOTE — Progress Notes (Signed)
Hematology/Oncology Consult note [facility] Telephone:([phone] [phone]  Patient Care Team: Allegra Grana, FNP as PCP - General (Family Medicine)   Name of the patient: [name]  [id number]  1965-09-25    Reason for referral- anemia   Referring physician- Rennie Plowman FNP  Date of visit: 04/21/17   History of presenting illness- patient is a 51 year old female with a past medical history significant for hypertension and hyperlipidemia, type 2 diabetes, hypothyroidism and history of gastric bypass surgery. She has been referred to me for iron deficiency anemia. Her bypass surgery was in 2013. She has been seen by Dr. Servando Snare and will soon be undergoing EGD and colonoscopy. Recent CBC from 02/23/2017 showed white count of 4.9, H&H of 10.8/34.6 with an MCV of 81.5 and a platelet count of 288. Iron studies showed a low serum iron of 18 and low iron saturation ratio of 3.8%. Ferritin was low at 6.2. Urinalysis showed no hematuria. B12 and folate from May 2018 were within normal limits. methylmalonic acid level was normal and intrinsic factor antibody was negative. TSH was normal at 1.53  She has been taking oral iron every other day since May. Her hemoglobin has been being between 10-11 since December 2017. Prior to that it was between 12-13. Family history of colon cancer in maternal aunt. She has never had a colonoscopy to date. Denies any melena or bleeding in her urine or stool. She has lost about 15 pounds of weight intentionally  ECOG PS- 0  Pain scale- 8- back pain and leg pain   Review of systems- Review of Systems  Constitutional: Positive for malaise/fatigue. Negative for chills, fever and weight loss.  HENT: Negative for congestion, ear discharge and nosebleeds.   Eyes: Negative for blurred vision.  Respiratory: Negative for cough, hemoptysis, sputum production, shortness of breath and wheezing.   Cardiovascular: Negative for  chest pain, palpitations, orthopnea and claudication.  Gastrointestinal: Negative for abdominal pain, blood in stool, constipation, diarrhea, heartburn, melena, nausea and vomiting.  Genitourinary: Negative for dysuria, flank pain, frequency, hematuria and urgency.  Musculoskeletal: Positive for back pain. Negative for joint pain and myalgias.       Leg pain  Skin: Negative for rash.  Neurological: Positive for headaches. Negative for dizziness, tingling, focal weakness, seizures and weakness.  Endo/Heme/Allergies: Does not bruise/bleed easily.  Psychiatric/Behavioral: Negative for depression and suicidal ideas. The patient does not have insomnia.     Allergies  Allergen Reactions  . Glipizide Nausea And Vomiting    REACTION: nausea  . Hydrocodone-Acetaminophen Itching  . Lyrica [Pregabalin]     Weight gain  . Metformin Diarrhea    REACTION: GI  . Penicillins Itching and Swelling    Has patient had a PCN reaction causing immediate rash, facial/tongue/throat swelling, SOB or lightheadedness with hypotension: yes Has patient had a PCN reaction causing severe rash involving mucus membranes or skin necrosis: no Has patient had a PCN reaction that required hospitalization : yes ed visit Has patient had a PCN reaction occurring within the last 10 years: yes If all of the above answers are "NO", then may proceed with Cephalosporin use.   . Adhesive [Tape] Rash    Red, and itching  . Tapentadol Rash    Red, and itching    Patient Active Problem List   Diagnosis Date Noted  . Nonintractable headache 02/23/2017  . Narcotic overdose 01/12/2017  . Tremor 10/29/2016  . Benign paroxysmal positional vertigo 08/21/2016  . Dizziness 05/15/2015  .  Right-sided low back pain without sciatica 10/27/2014  . Hyperpigmentation 05/05/2014  . Viral wart on finger 05/05/2014  . Gluteal pain 12/28/2013  . Elevated LFTs 12/13/2013  . Depression with anxiety 10/26/2012  . Anemia 05/05/2012  .  Hypothyroidism 10/28/2007  . HYPERCHOLESTEROLEMIA, PURE 05/21/2007     Past Medical History:  Diagnosis Date  . Anxiety and depression   . Chronic back pain   . DM2 (diabetes mellitus, type 2) (HCC)   . HLD (hyperlipidemia)   . HTN (hypertension)   . Hypothyroid   . Obesity      Past Surgical History:  Procedure Laterality Date  . Abd US - gallstones  5/03  . BACK SURGERY  2009   decompressive laminectomy  . BARIATRIC SURGERY  03/02/2012   gastric bypass; Dr Smitty CordsBruce  . CHOLECYSTECTOMY  6/03  . DILATION AND CURETTAGE OF UTERUS  1/05  . KNEE SURGERY     L - arthroscopic  . LIVER BIOPSY  6/03   fatty liver  . LUMBAR SPINE SURGERY  2015   Dr. Amalia Greenhouseehmig  . NASAL SINUS SURGERY    . PANNICULECTOMY  08/2013   [facility], Dr. Diamond NickelErdman  . sleep study - apnea  8/02    Social History   Social History  . Marital status: Married    Spouse name: N/A  . Number of children: N/A  . Years of education: N/A   Occupational History  . Not on file.   Social History Main Topics  . Smoking status: Never Smoker  . Smokeless tobacco: Never Used     Comment: tobacco use - no  . Alcohol use No  . Drug use: No  . Sexual activity: Not on file   Other Topics Concern  . Not on file   Social History Narrative   Single, no children; works at  [employer], does not get regular exercise.      Family History  Problem Relation Age of Onset  . Cancer Father      Current Outpatient Prescriptions:  .  baclofen (LIORESAL) 10 MG tablet, Take 10 mg by mouth daily., Disp: , Rfl:  .  BIOTIN PO, Take 1 capsule by mouth daily., Disp: , Rfl:  .  buPROPion (WELLBUTRIN SR) 200 MG 12 hr tablet, Take 200 mg by mouth 2 (two) times daily., Disp: , Rfl:  .  Cholecalciferol (VITAMIN D-1000 MAX ST) 1000 units tablet, Take 2,000 Units by mouth daily., Disp: , Rfl:  .  DULoxetine (CYMBALTA) 60 MG capsule, Take 60 mg by mouth 2 (two) times daily., Disp: , Rfl:  .  ferrous sulfate (EQL SLOW RELEASE  IRON) 160 (50 Fe) MG TBCR SR tablet, Take 1 tablet (160 mg total) by mouth every other day., Disp: 30 each, Rfl: 6 .  gabapentin (NEURONTIN) 400 MG capsule, Take 800 mg by mouth 2 (two) times daily. , Disp: , Rfl:  .  HYDROmorphone (DILAUDID) 4 MG tablet, Take 4 mg by mouth 4 (four) times daily as needed for moderate pain or severe pain. , Disp: , Rfl:  .  levothyroxine (SYNTHROID, LEVOTHROID) 100 MCG tablet, TAKE 1 TABLET BY MOUTH EVERY DAY, Disp: 30 tablet, Rfl: 2 .  Multiple Vitamin (MULTIVITAMIN) tablet, Take 1 tablet by mouth daily., Disp: , Rfl:  .  propranolol (INDERAL) 10 MG tablet, TAKE 1 TABLET BY MOUTH TWICE A DAY, Disp: 90 tablet, Rfl: 0   Physical exam:  Vitals:   04/21/17 0838  BP: 110/64  Pulse: 68  Resp: 18  Temp: (!) 96.9 F (36.1 C)  TempSrc: Tympanic  Weight: 181 lb 9.6 oz (82.4 kg)  Height: 5\' 5"  (1.651 m)   Physical Exam  Constitutional: She is oriented to person, place, and time and well-developed, well-nourished, and in no distress.  HENT:  Head: Normocephalic and atraumatic.  Eyes: Pupils are equal, round, and reactive to light. EOM are normal.  Neck: Normal range of motion.  Cardiovascular: Normal rate, regular rhythm and normal heart sounds.   Pulmonary/Chest: Effort normal and breath sounds normal.  Abdominal: Soft. Bowel sounds are normal.  Neurological: She is alert and oriented to person, place, and time.  Skin: Skin is warm and dry.       CMP Latest Ref Rng & Units 02/12/2017  Glucose 65 - 99 mg/dL 66  BUN 6 - 20 mg/dL 16(X)  Creatinine 0.96 - 1.00 mg/dL 0.45  Sodium 409 - 811 mmol/L 139  Potassium 3.5 - 5.1 mmol/L 3.8  Chloride 101 - 111 mmol/L 105  CO2 22 - 32 mmol/L 26  Calcium 8.9 - 10.3 mg/dL 9.0  Total Protein 6.5 - 8.1 g/dL -  Total Bilirubin 0.3 - 1.2 mg/dL -  Alkaline Phos 38 - 914 U/L -  AST 15 - 41 U/L -  ALT 14 - 54 U/L -   CBC Latest Ref Rng & Units 02/23/2017  WBC 4.0 - 10.5 K/uL 4.9  Hemoglobin 12.0 - 15.0 g/dL 10.8(L)    Hematocrit 36.0 - 46.0 % 34.6(L)  Platelets 150.0 - 400.0 K/uL 288.0    Assessment and plan- Patient is a 51 y.o. female referred for iron deficiency anemia  Iron deficiency anemia likely secondary to gastric bypass. She is due to undergo EGD and colonoscopy next week. Today I will repeat her CBC, ferritin and iron studies as well as copper, celiac disease panel and stool H. pylori antigen. If she has had no significant improvement in her hemoglobin we will make plans for 2 doses of ferriheme 510 mg IV. I discussed the risks and benefits of ferriheme including all but not limited to headaches, fatigue, leg swelling and risk of infusion reactions. Patient understands and agrees to proceed as planned. I will see her back in 2 months time with CBC ferritin and iron studies. She will continue PO B12   Thank you for this kind referral and the opportunity to participate in the care of this patient   Visit Diagnosis 1. Iron deficiency anemia, unspecified iron deficiency anemia type   2. H/O gastric bypass     Dr. Owens Shark, MD, MPH [facility] Pager- [phone] 04/21/2017

## 2017-04-21 NOTE — Progress Notes (Signed)
Pt had gastric bypass surgery . Taking iron every other day.

## 2017-04-22 ENCOUNTER — Ambulatory Visit (INDEPENDENT_AMBULATORY_CARE_PROVIDER_SITE_OTHER): Payer: BLUE CROSS/BLUE SHIELD | Admitting: Family

## 2017-04-22 ENCOUNTER — Encounter: Payer: Self-pay | Admitting: Family

## 2017-04-22 DIAGNOSIS — Z9884 Bariatric surgery status: Secondary | ICD-10-CM | POA: Diagnosis not present

## 2017-04-22 DIAGNOSIS — D509 Iron deficiency anemia, unspecified: Secondary | ICD-10-CM

## 2017-04-22 NOTE — Progress Notes (Signed)
Subjective:    Patient ID: [name], female    DOB: 1966/09/13, 51 y.o.   MRN: [id number]  CC: [name] is a 51 y.o. female who presents today for follow up.   HPI: IDA- feeling better. Less fatigue since infusion yesterday.   Would like to know how much vitamin A she should be taking since  H/o gastric bypass     [name]- IDA secondary to gastric bypass; 2 doses of ferrihem yesterday [name]- EGD, colonoscopy soon    HISTORY:  Past Medical History:  Diagnosis Date  . Anemia   . Anxiety and depression   . Chronic back pain   . DM2 (diabetes mellitus, type 2) (HCC)   . HLD (hyperlipidemia)   . HTN (hypertension)   . Hypothyroid   . Obesity    Past Surgical History:  Procedure Laterality Date  . Abd [place] - gallstones  5/03  . BACK SURGERY  2009   decompressive laminectomy  . BARIATRIC SURGERY  03/02/2012   gastric bypass; Cheryl [name]  . CHOLECYSTECTOMY  6/03  . DILATION AND CURETTAGE OF UTERUS  1/05  . KNEE SURGERY     L - arthroscopic  . LIVER BIOPSY  6/03   fatty liver  . LUMBAR SPINE SURGERY  2015   Cheryl. Amalia Becker  . NASAL SINUS SURGERY    . PANNICULECTOMY  08/2013   [name], [name]  . sleep study - apnea  8/02   Family History  Problem Relation Age of Onset  . Lung cancer Father   . Diabetes Mother   . Diabetes Sister   . Fibromyalgia Sister   . Colon cancer Maternal Aunt   . Skin cancer Paternal Aunt   . Skin cancer Paternal Uncle     Allergies: Glipizide; Hydrocodone-acetaminophen; Lyrica [pregabalin]; Metformin; Penicillins; Adhesive [tape]; and Tapentadol Current Outpatient Prescriptions on File Prior to Visit  Medication Sig Dispense Refill  . baclofen (LIORESAL) 10 MG tablet Take 10 mg by mouth daily.    Marland Kitchen BIOTIN PO Take 1 capsule by mouth daily. 10,000 mcg    . buPROPion (WELLBUTRIN SR) 200 MG 12 hr tablet Take 400 mg by mouth daily.     . Cholecalciferol (VITAMIN D-1000 MAX ST) 1000 units tablet Take 2,000 Units by mouth  daily.    . DULoxetine (CYMBALTA) 60 MG capsule Take 60 mg by mouth 2 (two) times daily.    . ferrous sulfate (EQL SLOW RELEASE IRON) 160 (50 Fe) MG TBCR SR tablet Take 1 tablet (160 mg total) by mouth every other day. 30 each 6  . gabapentin (NEURONTIN) 400 MG capsule Take 800 mg by mouth 2 (two) times daily.     Marland Kitchen HYDROmorphone (DILAUDID) 4 MG tablet Take 4 mg by mouth 4 (four) times daily as needed for moderate pain or severe pain.     Marland Kitchen levothyroxine (SYNTHROID, LEVOTHROID) 100 MCG tablet TAKE 1 TABLET BY MOUTH EVERY DAY 30 tablet 2  . Melatonin 2.5 MG CHEW Chew 1 Dose by mouth at bedtime.    . vitamin B-12 (CYANOCOBALAMIN) 1000 MCG tablet Take 1,000 mcg by mouth daily.     No current facility-administered medications on file prior to visit.     Social History  Substance Use Topics  . Smoking status: Never Smoker  . Smokeless tobacco: Never Used     Comment: tobacco use - no  . Alcohol use No    Review of Systems  Constitutional: Positive for fatigue. Negative  for chills and fever.  Respiratory: Negative for cough.   Cardiovascular: Negative for chest pain and palpitations.  Gastrointestinal: Negative for nausea and vomiting.      Objective:    BP 118/64   Pulse 85   Temp 98.4 F (36.9 C) (Oral)   Ht 5\' 5"  (1.651 m)   Wt 181 lb 6.4 oz (82.3 kg)   SpO2 98%   BMI 30.19 kg/m  BP Readings from Last 3 Encounters:  04/22/17 118/64  04/21/17 107/69  04/21/17 110/64   Wt Readings from Last 3 Encounters:  04/22/17 181 lb 6.4 oz (82.3 kg)  04/21/17 181 lb 9.6 oz (82.4 kg)  04/01/17 191 lb (86.6 kg)    Physical Exam  Constitutional: She appears well-developed and well-nourished.  Eyes: Conjunctivae are normal.  Cardiovascular: Normal rate, regular rhythm, normal heart sounds and normal pulses.   Pulmonary/Chest: Effort normal and breath sounds normal. She has no wheezes. She has no rhonchi. She has no rales.  Neurological: She is alert.  Skin: Skin is warm and dry.    Psychiatric: She has a normal mood and affect. Her speech is normal and behavior is normal. Thought content normal.  Vitals reviewed.      Assessment & Plan:   Problem List Items Addressed This Visit      Other   Iron deficiency anemia    Following with [name], [name]. Symptomatically improving. Will continue to follow.  Of note, advised her of regular daily recommended vitamin A. Advised her to follow up with [facility] for increased requirements due to h/o gastric bypass.           I am having [name] maintain her baclofen, buPROPion, DULoxetine, gabapentin, HYDROmorphone, BIOTIN PO, Cholecalciferol, levothyroxine, ferrous sulfate, vitamin B-12, and Melatonin.   No orders of the defined types were placed in this encounter.   Return precautions given.   Risks, benefits, and alternatives of the medications and treatment plan prescribed today were discussed, and patient expressed understanding.   Education regarding symptom management and diagnosis given to patient on AVS.  Continue to follow with [name], Cheryl Wnek G, FNP for routine health maintenance.   Cheryl StainMelissa Carol Rajagopalan and I agreed with plan.   Rennie PlowmanMargaret Annlee Glandon, FNP

## 2017-04-22 NOTE — Patient Instructions (Addendum)
As discussed,   Vitamin D- for females 2300 international units (700 micrograms retinol) daily. I would stick with this dose to avoid any vitamin a toxicity.   Let me know how you are doing and happy to see you, always!

## 2017-04-22 NOTE — Progress Notes (Signed)
Pre visit review using our clinic review tool, if applicable. No additional management support is needed unless otherwise documented below in the visit note. 

## 2017-04-22 NOTE — Assessment & Plan Note (Addendum)
Following with [name], Rao. Symptomatically improving. Will continue to follow.  Of note, advised her of regular daily recommended vitamin A. Advised her to follow up with [facility] for increased requirements due to h/o gastric bypass.

## 2017-04-23 LAB — COPPER, SERUM: COPPER: 122 ug/dL (ref 72–166)

## 2017-04-23 LAB — CELIAC DISEASE PANEL
Endomysial Ab, IgA: NEGATIVE
IGA: 173 mg/dL (ref 87–352)
Tissue Transglutaminase Ab, IgA: <2 U/mL (ref 0–3)

## 2017-04-24 ENCOUNTER — Telehealth: Payer: Self-pay | Admitting: Family

## 2017-04-24 ENCOUNTER — Encounter: Payer: Self-pay | Admitting: Family

## 2017-04-24 DIAGNOSIS — B001 Herpesviral vesicular dermatitis: Secondary | ICD-10-CM

## 2017-04-24 MED ORDER — ACYCLOVIR 400 MG PO TABS: 400.0000 mg | ORAL_TABLET | Freq: Four times a day (QID) | ORAL | 1 refills | Status: AC

## 2017-04-24 NOTE — Telephone Encounter (Signed)
Sent rx.

## 2017-04-27 ENCOUNTER — Encounter: Payer: Self-pay | Admitting: *Deleted

## 2017-04-28 ENCOUNTER — Inpatient Hospital Stay: Payer: BLUE CROSS/BLUE SHIELD

## 2017-04-28 VITALS — BP 112/72 | HR 76 | Temp 97.0°F | Resp 18

## 2017-04-28 DIAGNOSIS — Z800 Family history of malignant neoplasm of digestive organs: Secondary | ICD-10-CM | POA: Diagnosis not present

## 2017-04-28 DIAGNOSIS — R5383 Other fatigue: Secondary | ICD-10-CM | POA: Diagnosis not present

## 2017-04-28 DIAGNOSIS — F329 Major depressive disorder, single episode, unspecified: Secondary | ICD-10-CM | POA: Diagnosis not present

## 2017-04-28 DIAGNOSIS — E7800 Pure hypercholesterolemia, unspecified: Secondary | ICD-10-CM | POA: Diagnosis not present

## 2017-04-28 DIAGNOSIS — D509 Iron deficiency anemia, unspecified: Secondary | ICD-10-CM

## 2017-04-28 DIAGNOSIS — Z9884 Bariatric surgery status: Secondary | ICD-10-CM | POA: Diagnosis not present

## 2017-04-28 DIAGNOSIS — R531 Weakness: Secondary | ICD-10-CM | POA: Diagnosis not present

## 2017-04-28 DIAGNOSIS — E785 Hyperlipidemia, unspecified: Secondary | ICD-10-CM | POA: Diagnosis not present

## 2017-04-28 DIAGNOSIS — H8110 Benign paroxysmal vertigo, unspecified ear: Secondary | ICD-10-CM | POA: Diagnosis not present

## 2017-04-28 DIAGNOSIS — M549 Dorsalgia, unspecified: Secondary | ICD-10-CM | POA: Diagnosis not present

## 2017-04-28 DIAGNOSIS — E119 Type 2 diabetes mellitus without complications: Secondary | ICD-10-CM | POA: Diagnosis not present

## 2017-04-28 DIAGNOSIS — Z79899 Other long term (current) drug therapy: Secondary | ICD-10-CM | POA: Diagnosis not present

## 2017-04-28 DIAGNOSIS — E039 Hypothyroidism, unspecified: Secondary | ICD-10-CM | POA: Diagnosis not present

## 2017-04-28 DIAGNOSIS — I10 Essential (primary) hypertension: Secondary | ICD-10-CM | POA: Diagnosis not present

## 2017-04-28 MED ORDER — SODIUM CHLORIDE 0.9 % IV SOLN
Freq: Once | INTRAVENOUS | Status: AC
  Administered 2017-04-28: 14:00:00 via INTRAVENOUS
  Filled 2017-04-28: qty 1000

## 2017-04-28 MED ORDER — SODIUM CHLORIDE 0.9 % IV SOLN
510.0000 mg | Freq: Once | INTRAVENOUS | Status: AC
  Administered 2017-04-28: 510 mg via INTRAVENOUS
  Filled 2017-04-28: qty 17

## 2017-04-28 NOTE — Anesthesia Preprocedure Evaluation (Addendum)
Anesthesia Evaluation  Patient identified by MRN, date of birth, ID band Patient awake    Reviewed: Allergy & Precautions, NPO status , Patient's Chart, lab work & pertinent test results  History of Anesthesia Complications Negative for: history of anesthetic complications  Airway Mallampati: III  TM Distance: >3 FB Neck ROM: Full    Dental no notable dental hx.    Pulmonary neg pulmonary ROS,    Pulmonary exam normal breath sounds clear to auscultation       Cardiovascular Exercise Tolerance: Good hypertension (improved s/p gastric bypass (not on medication)), Normal cardiovascular exam Rhythm:Regular Rate:Normal  ECG 02/12/17: SR with short PR, otherwise normal   Neuro/Psych PSYCHIATRIC DISORDERS Anxiety Depression negative neurological ROS     GI/Hepatic S/p gastric bypass   Endo/Other  diabetes (improved s/p gastric bypass (not on medication))Hypothyroidism   Renal/GU negative Renal ROS     Musculoskeletal   Abdominal   Peds  Hematology  (+) Blood dyscrasia, anemia ,   Anesthesia Other Findings   Reproductive/Obstetrics                            Anesthesia Physical Anesthesia Plan  ASA: II  Anesthesia Plan: General   Post-op Pain Management:    Induction: Intravenous  PONV Risk Score and Plan: 2 and Ondansetron and Propofol infusion  Airway Management Planned: Natural Airway  Additional Equipment:   Intra-op Plan:   Post-operative Plan:   Informed Consent: I have reviewed the patients History and Physical, chart, labs and discussed the procedure including the risks, benefits and alternatives for the proposed anesthesia with the patient or authorized representative who has indicated his/her understanding and acceptance.     Plan Discussed with: CRNA  Anesthesia Plan Comments:        Anesthesia Quick Evaluation

## 2017-04-30 DIAGNOSIS — G894 Chronic pain syndrome: Secondary | ICD-10-CM | POA: Diagnosis not present

## 2017-04-30 DIAGNOSIS — M5416 Radiculopathy, lumbar region: Secondary | ICD-10-CM | POA: Diagnosis not present

## 2017-04-30 DIAGNOSIS — Z79891 Long term (current) use of opiate analgesic: Secondary | ICD-10-CM | POA: Diagnosis not present

## 2017-04-30 DIAGNOSIS — M48061 Spinal stenosis, lumbar region without neurogenic claudication: Secondary | ICD-10-CM | POA: Diagnosis not present

## 2017-04-30 DIAGNOSIS — F331 Major depressive disorder, recurrent, moderate: Secondary | ICD-10-CM | POA: Diagnosis not present

## 2017-04-30 NOTE — Discharge Instructions (Signed)
General Anesthesia, Adult, Care After °These instructions provide you with information about caring for yourself after your procedure. Your health care provider may also give you more specific instructions. Your treatment has been planned according to current medical practices, but problems sometimes occur. Call your health care provider if you have any problems or questions after your procedure. °What can I expect after the procedure? °After the procedure, it is common to have: °· Vomiting. °· A sore throat. °· Mental slowness. ° °It is common to feel: °· Nauseous. °· Cold or shivery. °· Sleepy. °· Tired. °· Sore or achy, even in parts of your body where you did not have surgery. ° °Follow these instructions at home: °For at least 24 hours after the procedure: °· Do not: °? Participate in activities where you could fall or become injured. °? Drive. °? Use heavy machinery. °? Drink alcohol. °? Take sleeping pills or medicines that cause drowsiness. °? Make important decisions or sign legal documents. °? Take care of children on your own. °· Rest. °Eating and drinking °· If you vomit, drink water, juice, or soup when you can drink without vomiting. °· Drink enough fluid to keep your urine clear or pale yellow. °· Make sure you have little or no nausea before eating solid foods. °· Follow the diet recommended by your health care provider. °General instructions °· Have a responsible adult stay with you until you are awake and alert. °· Return to your normal activities as told by your health care provider. Ask your health care provider what activities are safe for you. °· Take over-the-counter and prescription medicines only as told by your health care provider. °· If you smoke, do not smoke without supervision. °· Keep all follow-up visits as told by your health care provider. This is important. °Contact a health care provider if: °· You continue to have nausea or vomiting at home, and medicines are not helpful. °· You  cannot drink fluids or start eating again. °· You cannot urinate after 8-12 hours. °· You develop a skin rash. °· You have fever. °· You have increasing redness at the site of your procedure. °Get help right away if: °· You have difficulty breathing. °· You have chest pain. °· You have unexpected bleeding. °· You feel that you are having a life-threatening or urgent problem. °This information is not intended to replace advice given to you by your health care provider. Make sure you discuss any questions you have with your health care provider. °Document Released: 12/08/2000 Document Revised: 02/04/2016 Document Reviewed: 08/16/2015 °Elsevier Interactive Patient Education © 2018 [employer] ° °

## 2017-05-01 ENCOUNTER — Encounter
Admission: RE | Disposition: A | Discharge status: Home / Self Care | Payer: Self-pay | Source: Ambulatory Visit | Attending: Gastroenterology

## 2017-05-01 ENCOUNTER — Ambulatory Visit: Payer: BLUE CROSS/BLUE SHIELD | Admitting: Anesthesiology

## 2017-05-01 ENCOUNTER — Ambulatory Visit
Admission: RE | Admit: 2017-05-01 | Discharge: 2017-05-01 | Disposition: A | Discharge status: Home / Self Care | Payer: BLUE CROSS/BLUE SHIELD | Source: Ambulatory Visit | Attending: Gastroenterology | Admitting: Gastroenterology

## 2017-05-01 DIAGNOSIS — K289 Gastrojejunal ulcer, unspecified as acute or chronic, without hemorrhage or perforation: Secondary | ICD-10-CM | POA: Diagnosis not present

## 2017-05-01 DIAGNOSIS — D649 Anemia, unspecified: Secondary | ICD-10-CM | POA: Insufficient documentation

## 2017-05-01 DIAGNOSIS — Z6831 Body mass index (BMI) 31.0-31.9, adult: Secondary | ICD-10-CM | POA: Insufficient documentation

## 2017-05-01 DIAGNOSIS — D509 Iron deficiency anemia, unspecified: Secondary | ICD-10-CM | POA: Diagnosis not present

## 2017-05-01 DIAGNOSIS — E669 Obesity, unspecified: Secondary | ICD-10-CM | POA: Diagnosis not present

## 2017-05-01 DIAGNOSIS — F419 Anxiety disorder, unspecified: Secondary | ICD-10-CM | POA: Diagnosis not present

## 2017-05-01 DIAGNOSIS — G8929 Other chronic pain: Secondary | ICD-10-CM | POA: Diagnosis not present

## 2017-05-01 DIAGNOSIS — Z9884 Bariatric surgery status: Secondary | ICD-10-CM

## 2017-05-01 DIAGNOSIS — Z79899 Other long term (current) drug therapy: Secondary | ICD-10-CM | POA: Insufficient documentation

## 2017-05-01 DIAGNOSIS — I10 Essential (primary) hypertension: Secondary | ICD-10-CM | POA: Diagnosis not present

## 2017-05-01 DIAGNOSIS — F329 Major depressive disorder, single episode, unspecified: Secondary | ICD-10-CM | POA: Insufficient documentation

## 2017-05-01 DIAGNOSIS — D500 Iron deficiency anemia secondary to blood loss (chronic): Secondary | ICD-10-CM

## 2017-05-01 DIAGNOSIS — E039 Hypothyroidism, unspecified: Secondary | ICD-10-CM | POA: Insufficient documentation

## 2017-05-01 DIAGNOSIS — E119 Type 2 diabetes mellitus without complications: Secondary | ICD-10-CM | POA: Insufficient documentation

## 2017-05-01 DIAGNOSIS — R51 Headache: Secondary | ICD-10-CM | POA: Insufficient documentation

## 2017-05-01 DIAGNOSIS — E785 Hyperlipidemia, unspecified: Secondary | ICD-10-CM | POA: Diagnosis not present

## 2017-05-01 HISTORY — DX: Other symptoms and signs involving the musculoskeletal system: R29.898

## 2017-05-01 HISTORY — DX: Headache: R51

## 2017-05-01 HISTORY — PX: ESOPHAGOGASTRODUODENOSCOPY (EGD) WITH PROPOFOL: SHX5813

## 2017-05-01 HISTORY — DX: Other specified postprocedural states: Z98.890

## 2017-05-01 HISTORY — DX: Presence of spectacles and contact lenses: Z97.3

## 2017-05-01 HISTORY — DX: Headache, unspecified: R51.9

## 2017-05-01 HISTORY — DX: Presence of other specified functional implants: Z96.89

## 2017-05-01 HISTORY — PX: COLONOSCOPY WITH PROPOFOL: SHX5780

## 2017-05-01 SURGERY — COLONOSCOPY WITH PROPOFOL
Anesthesia: General | Wound class: Contaminated

## 2017-05-01 MED ORDER — GLYCOPYRROLATE 0.2 MG/ML IJ SOLN
INTRAMUSCULAR | Status: DC | PRN
  Administered 2017-05-01: 0.1 mg via INTRAVENOUS

## 2017-05-01 MED ORDER — LIDOCAINE HCL (CARDIAC) 20 MG/ML IV SOLN
INTRAVENOUS | Status: DC | PRN
  Administered 2017-05-01: 50 mg via INTRAVENOUS

## 2017-05-01 MED ORDER — STERILE WATER FOR IRRIGATION IR SOLN
Status: DC | PRN
  Administered 2017-05-01: 11:00:00

## 2017-05-01 MED ORDER — ONDANSETRON HCL 4 MG/2ML IJ SOLN: 4.0000 mg | Freq: Once | INTRAMUSCULAR | Status: DC | PRN

## 2017-05-01 MED ORDER — SUCRALFATE 1 GM/10ML PO SUSP: 1.0000 g | Freq: Four times a day (QID) | ORAL | 3 refills | Status: DC

## 2017-05-01 MED ORDER — PROPOFOL 10 MG/ML IV BOLUS
INTRAVENOUS | Status: DC | PRN
  Administered 2017-05-01: 50 mg via INTRAVENOUS
  Administered 2017-05-01: 40 mg via INTRAVENOUS
  Administered 2017-05-01: 20 mg via INTRAVENOUS
  Administered 2017-05-01: 40 mg via INTRAVENOUS
  Administered 2017-05-01: 100 mg via INTRAVENOUS
  Administered 2017-05-01: 20 mg via INTRAVENOUS
  Administered 2017-05-01: 10 mg via INTRAVENOUS
  Administered 2017-05-01: 20 mg via INTRAVENOUS

## 2017-05-01 MED ORDER — LACTATED RINGERS IV SOLN
INTRAVENOUS | Status: DC
  Administered 2017-05-01: 10:00:00 via INTRAVENOUS

## 2017-05-01 SURGICAL SUPPLY — 35 items
BALLN DILATOR 10-12 8 (BALLOONS)
BALLN DILATOR 12-15 8 (BALLOONS)
BALLN DILATOR 15-18 8 (BALLOONS)
BALLN DILATOR CRE 0-12 8 (BALLOONS)
BALLN DILATOR ESOPH 8 10 CRE (MISCELLANEOUS) IMPLANT
BALLOON DILATOR 12-15 8 (BALLOONS) IMPLANT
BALLOON DILATOR 15-18 8 (BALLOONS) IMPLANT
BALLOON DILATOR CRE 0-12 8 (BALLOONS) IMPLANT
BLOCK BITE 60FR ADLT L/F GRN (MISCELLANEOUS) ×2 IMPLANT
CANISTER SUCT 1200ML W/VALVE (MISCELLANEOUS) ×2 IMPLANT
CLIP HMST 235XBRD CATH ROT (MISCELLANEOUS) IMPLANT
CLIP RESOLUTION 360 11X235 (MISCELLANEOUS) ×2
FCP ESCP3.2XJMB 240X2.8X (MISCELLANEOUS)
FORCEPS BIOP RAD 4 LRG CAP 4 (CUTTING FORCEPS) IMPLANT
FORCEPS BIOP RJ4 240 W/NDL (MISCELLANEOUS)
FORCEPS ESCP3.2XJMB 240X2.8X (MISCELLANEOUS) IMPLANT
GOWN CVR UNV OPN BCK APRN NK (MISCELLANEOUS) ×2 IMPLANT
GOWN ISOL THUMB LOOP REG UNIV (MISCELLANEOUS) ×4
INJECTOR VARIJECT VIN23 (MISCELLANEOUS) IMPLANT
KIT DEFENDO VALVE AND CONN (KITS) IMPLANT
KIT ENDO PROCEDURE OLY (KITS) ×2 IMPLANT
MARKER SPOT ENDO TATTOO 5ML (MISCELLANEOUS) IMPLANT
PAD GROUND ADULT SPLIT (MISCELLANEOUS) IMPLANT
PROBE APC STR FIRE (PROBE) IMPLANT
RETRIEVER NET PLAT FOOD (MISCELLANEOUS) IMPLANT
RETRIEVER NET ROTH 2.5X230 LF (MISCELLANEOUS) IMPLANT
SNARE SHORT THROW 13M SML OVAL (MISCELLANEOUS) IMPLANT
SNARE SHORT THROW 30M LRG OVAL (MISCELLANEOUS) IMPLANT
SNARE SNG USE RND 15MM (INSTRUMENTS) IMPLANT
SPOT EX ENDOSCOPIC TATTOO (MISCELLANEOUS)
SYR INFLATION 60ML (SYRINGE) IMPLANT
TRAP ETRAP POLY (MISCELLANEOUS) IMPLANT
VARIJECT INJECTOR VIN23 (MISCELLANEOUS)
WATER STERILE IRR 250ML POUR (IV SOLUTION) ×2 IMPLANT
WIRE CRE 18-20MM 8CM F G (MISCELLANEOUS) IMPLANT

## 2017-05-01 NOTE — Op Note (Signed)
[facility] Gastroenterology Patient Name: [name] Procedure Date: 05/01/2017 11:03 AM MRN: [id number] Account #: [id number] Date of Birth: 16-Apr-1966 Admit Type: Outpatient Age: 51 Room: [facility] OR ROOM 01 Gender: Female Note Status: Finalized Procedure:            Upper GI endoscopy Indications:          Iron deficiency anemia Providers:            Midge Minium MD, MD Referring MD:         Lyn Records. Arnett (Referring MD) Medicines:            Propofol per Anesthesia Procedure:            Pre-Anesthesia Assessment:                       - Prior to the procedure, a History and Physical was                        performed, and patient medications and allergies were                        reviewed. The patient's tolerance of previous                        anesthesia was also reviewed. The risks and benefits of                        the procedure and the sedation options and risks were                        discussed with the patient. All questions were                        answered, and informed consent was obtained. Prior                        Anticoagulants: The patient has taken no previous                        anticoagulant or antiplatelet agents. ASA Grade                        Assessment: II - A patient with mild systemic disease.                        After reviewing the risks and benefits, the patient was                        deemed in satisfactory condition to undergo the                        procedure.                       After obtaining informed consent, the endoscope was                        passed under direct vision. Throughout the procedure,  the patient's blood pressure, pulse, and oxygen                        saturations were monitored continuously. The Olympus                        GIF H180J Endoscope (S#: [id number]) was introduced                        through the mouth, and advanced to the jejunum.  The                        upper GI endoscopy was accomplished without difficulty.                        The patient tolerated the procedure well. Findings:      The examined esophagus was normal.      Evidence of a gastric bypass was found. A gastric pouch was found. The       staple line appeared intact. The gastrojejunal anastomosis was       characterized by an intact staple line. This was traversed.      A staple was found at the anastomosis. Removal was accomplished with a       regular forceps.      One non-bleeding cratered ulcer with no stigmata of bleeding was found       distal to the gastrojejunal anastomosis. The lesion was 10 mm in largest       dimension. Impression:           - Normal esophagus.                       - Gastric bypass with intact staple line. Gastrojejunal                        anastomosis characterized by an intact staple line.                       - A staple was found in the stomach. Removal was                        successful.                       - One non-bleeding jejunal ulcer with no stigmata of                        bleeding. Recommendation:       - Discharge patient to home.                       - Resume previous diet.                       - Continue present medications.                       - Use a proton pump inhibitor PO daily. Procedure Code(s):    --- Professional ---                       [phone], Esophagogastroduodenoscopy, flexible, transoral;  with removal of foreign body(s) Diagnosis Code(s):    --- Professional ---                       D50.9, Iron deficiency anemia, unspecified                       Z98.84, Bariatric surgery status                       K28.9, Gastrojejunal ulcer, unspecified as acute or                        chronic, without hemorrhage or perforation CPT copyright 2016 American Medical Association. All rights reserved. The codes documented in this report are preliminary and upon  coder review may  be revised to meet current compliance requirements. Midge Minium MD, MD 05/01/2017 11:16:53 AM This report has been signed electronically. Number of Addenda: 0 Note Initiated On: 05/01/2017 11:03 AM      [facility]

## 2017-05-01 NOTE — Anesthesia Postprocedure Evaluation (Signed)
Anesthesia Post Note  Patient: [name]  Procedure(s) Performed: Procedure(s) (LRB): COLONOSCOPY WITH PROPOFOL (N/A) ESOPHAGOGASTRODUODENOSCOPY (EGD) WITH PROPOFOL (N/A)  Patient location during evaluation: PACU Anesthesia Type: General Level of consciousness: awake and alert, oriented and patient cooperative Pain management: pain level controlled Vital Signs Assessment: post-procedure vital signs reviewed and stable Respiratory status: spontaneous breathing, nonlabored ventilation and respiratory function stable Cardiovascular status: blood pressure returned to baseline and stable Postop Assessment: adequate PO intake Anesthetic complications: no    Reed Breech

## 2017-05-01 NOTE — Anesthesia Procedure Notes (Signed)
Date/Time: 05/01/2017 11:03 AM Performed by: Maree Krabbe Pre-anesthesia Checklist: Patient identified, Emergency Drugs available, Suction available, Timeout performed and Patient being monitored Patient Re-evaluated:Patient Re-evaluated prior to induction Oxygen Delivery Method: Nasal cannula Placement Confirmation: positive ETCO2

## 2017-05-01 NOTE — Op Note (Signed)
[facility] Gastroenterology Patient Name: [name] Procedure Date: 05/01/2017 11:04 AM MRN: [id number] Account #: [id number] Date of Birth: [birth year] Admit Type: Outpatient Age: 51 Room: [facility] OR ROOM 01 Gender: Female Note Status: Finalized Procedure:            Colonoscopy Indications:          Iron deficiency anemia Providers:            Midge Minium MD, MD Referring MD:         Lyn Records. Arnett (Referring MD) Medicines:            Propofol per Anesthesia Complications:        No immediate complications. Procedure:            Pre-Anesthesia Assessment:                       - Prior to the procedure, a History and Physical was                        performed, and patient medications and allergies were                        reviewed. The patient's tolerance of previous                        anesthesia was also reviewed. The risks and benefits of                        the procedure and the sedation options and risks were                        discussed with the patient. All questions were                        answered, and informed consent was obtained. Prior                        Anticoagulants: The patient has taken no previous                        anticoagulant or antiplatelet agents. ASA Grade                        Assessment: II - A patient with mild systemic disease.                        After reviewing the risks and benefits, the patient was                        deemed in satisfactory condition to undergo the                        procedure.                       After obtaining informed consent, the colonoscope was                        passed under direct vision. Throughout the procedure,  the patient's blood pressure, pulse, and oxygen                        saturations were monitored continuously. The Olympus                        190 Colonoscope [id number]) was introduced through the     anus and advanced to the the cecum, identified by                        appendiceal orifice and ileocecal valve. The                        colonoscopy was performed without difficulty. The                        patient tolerated the procedure well. The quality of                        the bowel preparation was poor. Findings:      The perianal and digital rectal examinations were normal.      A moderate amount of stool was found in the entire colon, making       visualization difficult. Impression:           - Preparation of the colon was poor.                       - Stool in the entire examined colon.                       - No specimens collected. Recommendation:       - Discharge patient to home.                       - Resume previous diet.                       - Continue present medications.                       - Repeat colonoscopy in 5 years because the bowel                        preparation was poor. Procedure Code(s):    --- Professional ---                       [phone], Colonoscopy, flexible; diagnostic, including                        collection of specimen(s) by brushing or washing, when                        performed (separate procedure) Diagnosis Code(s):    --- Professional ---                       D50.9, Iron deficiency anemia, unspecified CPT copyright 2016 American Medical Association. All rights reserved. The codes documented in this report are preliminary and upon coder review may  be revised to meet current compliance requirements. Midge Minium MD, MD 05/01/2017 11:29:16 AM This report has been signed electronically. Number  of Addenda: 0 Note Initiated On: 05/01/2017 11:04 AM Scope Withdrawal Time: 0 hours 5 minutes 15 seconds  Total Procedure Duration: 0 hours 9 minutes 35 seconds       [facility]

## 2017-05-01 NOTE — H&P (Signed)
Midge Minium, MD [facility] [address] [place], [place] [address] Phone:[phone] Fax : [phone]  Primary Care Physician:  Allegra Grana, FNP Primary Gastroenterologist:  Dr. Servando Snare  Pre-Procedure History & Physical: HPI:  [name] is a 51 y.o. female is here for an endoscopy and colonoscopy.   Past Medical History:  Diagnosis Date  . Anemia   . Anxiety and depression   . Chronic back pain   . DM2 (diabetes mellitus, type 2) (HCC)    improved since gastric bypass  . Headache    stress/sinus - 2-3x/wk  . HLD (hyperlipidemia)   . HTN (hypertension)    improved since gastric bypass  . Hypothyroid   . Left leg weakness    s/p back surgery  . Obesity   . S/P insertion of spinal cord stimulator   . Wears contact lenses     Past Surgical History:  Procedure Laterality Date  . Abd [place] - gallstones  5/03  . BACK SURGERY  2009   decompressive laminectomy  . BARIATRIC SURGERY  03/02/2012   gastric bypass; Dr Smitty Cords  . CHOLECYSTECTOMY  6/03  . DILATION AND CURETTAGE OF UTERUS  1/05  . KNEE SURGERY     L - arthroscopic  . LIVER BIOPSY  6/03   fatty liver  . LUMBAR SPINE SURGERY  2015   Dr. Amalia Greenhouse  . NASAL SINUS SURGERY    . PANNICULECTOMY  08/2013   [facility], Dr. Diamond Nickel  . sleep study - apnea  04/2001   improved since gastric bypass    Prior to Admission medications   Medication Sig Start Date End Date Taking? Authorizing Provider  baclofen (LIORESAL) 10 MG tablet Take 10 mg by mouth daily. 07/28/16  Yes [provider]  BIOTIN PO Take 1 capsule by mouth daily. 10,000 mcg   Yes [provider]  buPROPion (WELLBUTRIN SR) 200 MG 12 hr tablet Take 400 mg by mouth daily.  08/04/16  Yes [provider]  Cholecalciferol (VITAMIN D-1000 MAX ST) 1000 units tablet Take 2,000 Units by mouth daily. 02/29/12  Yes [provider]  DULoxetine (CYMBALTA) 60 MG capsule Take 60 mg by mouth 2 (two) times daily. 07/28/16  Yes  [provider]  ferrous sulfate (EQL SLOW RELEASE IRON) 160 (50 Fe) MG TBCR SR tablet Take 1 tablet (160 mg total) by mouth every other day. 02/12/17  Yes Emily Filbert, MD  gabapentin (NEURONTIN) 400 MG capsule Take 800 mg by mouth 2 (two) times daily.  07/31/16  Yes [provider]  HYDROmorphone (DILAUDID) 4 MG tablet Take 4 mg by mouth 4 (four) times daily as needed for moderate pain or severe pain.  08/15/16  Yes [provider]  levothyroxine (SYNTHROID, LEVOTHROID) 100 MCG tablet TAKE 1 TABLET BY MOUTH EVERY DAY 01/30/17  Yes Arnett, Lyn Records, FNP  magnesium gluconate (MAGONATE) 500 MG tablet Take 500 mg by mouth daily.   Yes [provider]  Melatonin 2.5 MG CHEW Chew 1 Dose by mouth at bedtime.   Yes [provider]  tapentadol (NUCYNTA) 50 MG tablet Take 100 mg by mouth 2 (two) times daily.   Yes [provider]  vitamin B-12 (CYANOCOBALAMIN) 1000 MCG tablet Take 1,000 mcg by mouth daily.   Yes [provider]    Allergies as of 04/01/2017 - Review Complete 04/01/2017  Allergen Reaction Noted  . Glipizide Nausea And Vomiting 05/18/2007  . Hydrocodone-acetaminophen Itching 01/01/2017  . Lyrica [pregabalin]  11/22/2014  . Metformin Diarrhea 05/18/2007  . Penicillins Itching and Swelling 12/28/2007  . Adhesive [tape] Rash 08/19/2016  . Tapentadol Rash 08/19/2016    Family History  Problem Relation Age of Onset  . Lung cancer Father   . Diabetes Mother   . Diabetes Sister   . Fibromyalgia Sister   . Colon cancer Maternal Aunt   . Skin cancer Paternal Aunt   . Skin cancer Paternal Uncle     Social History   Social History  . Marital status: Married    Spouse name: N/A  . Number of children: N/A  . Years of education: N/A   Occupational History  . Not on file.   Social History Main Topics  . Smoking status: Never Smoker  . Smokeless tobacco: Never Used     Comment: tobacco use - no  . Alcohol use  No  . Drug use: No  . Sexual activity: Not on file   Other Topics Concern  . Not on file   Social History Narrative   Single, no children; works at  [employer] at [employer], does not get regular exercise.     Review of Systems: See HPI, otherwise negative ROS  Physical Exam: BP 113/65   Pulse 84   Temp 99.1 F (37.3 C) (Tympanic)   Resp 16   Ht 5\' 5"  (1.651 m)   Wt 188 lb (85.3 kg)   LMP 12/15/2015 (Approximate)   SpO2 95%   BMI 31.28 kg/m  General:   Alert,  pleasant and cooperative in NAD Head:  Normocephalic and atraumatic. Neck:  Supple; no masses or thyromegaly. Lungs:  Clear throughout to auscultation.    Heart:  Regular rate and rhythm. Abdomen:  Soft, nontender and nondistended. Normal bowel sounds, without guarding, and without rebound.   Neurologic:  Alert and  oriented x4;  grossly normal neurologically.  Impression/Plan: [name] is here for an endoscopy and colonoscopy to be performed for IDA  Risks, benefits, limitations, and alternatives regarding  endoscopy and colonoscopy have been reviewed with the patient.  Questions have been answered.  All parties agreeable.   Midge Minium, MD  05/01/2017, 10:24 AM

## 2017-05-01 NOTE — Transfer of Care (Signed)
Immediate Anesthesia Transfer of Care Note  Patient: [name]  Procedure(s) Performed: Procedure(s): COLONOSCOPY WITH PROPOFOL (N/A) ESOPHAGOGASTRODUODENOSCOPY (EGD) WITH PROPOFOL (N/A)  Patient Location: PACU  Anesthesia Type: General  Level of Consciousness: awake, alert  and patient cooperative  Airway and Oxygen Therapy: Patient Spontanous Breathing and Patient connected to supplemental oxygen  Post-op Assessment: Post-op Vital signs reviewed, Patient's Cardiovascular Status Stable, Respiratory Function Stable, Patent Airway and No signs of Nausea or vomiting  Post-op Vital Signs: Reviewed and stable  Complications: No apparent anesthesia complications

## 2017-05-03 ENCOUNTER — Other Ambulatory Visit: Payer: Self-pay | Admitting: Family

## 2017-05-04 DIAGNOSIS — R202 Paresthesia of skin: Secondary | ICD-10-CM | POA: Diagnosis not present

## 2017-05-06 DIAGNOSIS — M47816 Spondylosis without myelopathy or radiculopathy, lumbar region: Secondary | ICD-10-CM | POA: Diagnosis not present

## 2017-05-06 DIAGNOSIS — M5416 Radiculopathy, lumbar region: Secondary | ICD-10-CM | POA: Diagnosis not present

## 2017-05-06 DIAGNOSIS — M48061 Spinal stenosis, lumbar region without neurogenic claudication: Secondary | ICD-10-CM | POA: Diagnosis not present

## 2017-05-08 DIAGNOSIS — M545 Low back pain: Secondary | ICD-10-CM | POA: Diagnosis not present

## 2017-05-08 DIAGNOSIS — M5136 Other intervertebral disc degeneration, lumbar region: Secondary | ICD-10-CM | POA: Diagnosis not present

## 2017-05-30 ENCOUNTER — Ambulatory Visit
Admission: EM | Admit: 2017-05-30 | Discharge: 2017-05-30 | Disposition: A | Discharge status: Home / Self Care | Payer: BLUE CROSS/BLUE SHIELD | Attending: Family Medicine | Admitting: Family Medicine

## 2017-05-30 DIAGNOSIS — B029 Zoster without complications: Secondary | ICD-10-CM | POA: Diagnosis not present

## 2017-05-30 MED ORDER — VALACYCLOVIR HCL 1 G PO TABS: 1000.0000 mg | ORAL_TABLET | Freq: Three times a day (TID) | ORAL | 0 refills | Status: DC

## 2017-05-30 NOTE — ED Provider Notes (Signed)
[facility]    CSN: [id number] Arrival date & time: 05/30/17  1316     History   Chief Complaint Chief Complaint  Patient presents with  . Leg Pain    HPI [name] is a 51 y.o. female.   51 yo female with a c/o painful and itchy blistering rash on her left lower leg for 3-4 days. Patient denies any contact allergens, fevers, chills, drainage.    The history is provided by the patient.  Leg Pain    Past Medical History:  Diagnosis Date  . Anemia   . Anxiety and depression   . Chronic back pain   . DM2 (diabetes mellitus, type 2) (HCC)    improved since gastric bypass  . Headache    stress/sinus - 2-3x/wk  . HLD (hyperlipidemia)   . HTN (hypertension)    improved since gastric bypass  . Hypothyroid   . Left leg weakness    s/p back surgery  . Obesity   . S/P insertion of spinal cord stimulator   . Wears contact lenses     Patient Active Problem List   Diagnosis Date Noted  . Bariatric surgery status   . Gastrointestinal ulcer   . Iron deficiency anemia 04/21/2017  . Nonintractable headache 02/23/2017  . Narcotic overdose 01/12/2017  . Tremor 10/29/2016  . Benign paroxysmal positional vertigo 08/21/2016  . Dizziness 05/15/2015  . Right-sided low back pain without sciatica 10/27/2014  . Hyperpigmentation 05/05/2014  . Viral wart on finger 05/05/2014  . Gluteal pain 12/28/2013  . Elevated LFTs 12/13/2013  . Depression with anxiety 10/26/2012  . Anemia 05/05/2012  . Hypothyroidism 10/28/2007  . HYPERCHOLESTEROLEMIA, PURE 05/21/2007    Past Surgical History:  Procedure Laterality Date  . Abd [place] - gallstones  5/03  . BACK SURGERY  2009   decompressive laminectomy  . BARIATRIC SURGERY  03/02/2012   gastric bypass; Dr Smitty Cords  . CHOLECYSTECTOMY  6/03  . COLONOSCOPY WITH PROPOFOL N/A 05/01/2017   Procedure: COLONOSCOPY WITH PROPOFOL;  Surgeon: Midge Minium, MD;  Location: [facility];  Service: Gastroenterology;   Laterality: N/A;  . DILATION AND CURETTAGE OF UTERUS  1/05  . ESOPHAGOGASTRODUODENOSCOPY (EGD) WITH PROPOFOL N/A 05/01/2017   Procedure: ESOPHAGOGASTRODUODENOSCOPY (EGD) WITH PROPOFOL;  Surgeon: Midge Minium, MD;  Location: [facility];  Service: Gastroenterology;  Laterality: N/A;  . KNEE SURGERY     L - arthroscopic  . LIVER BIOPSY  6/03   fatty liver  . LUMBAR SPINE SURGERY  2015   Dr. Amalia Greenhouse  . NASAL SINUS SURGERY    . PANNICULECTOMY  08/2013   [facility], Dr. Diamond Nickel  . sleep study - apnea  04/2001   improved since gastric bypass    OB History    No data available       Home Medications    Prior to Admission medications   Medication Sig Start Date End Date Taking? Authorizing Provider  baclofen (LIORESAL) 10 MG tablet Take 10 mg by mouth daily. 07/28/16  Yes [provider]  BIOTIN PO Take 1 capsule by mouth daily. 10,000 mcg   Yes [provider]  buPROPion (WELLBUTRIN SR) 200 MG 12 hr tablet Take 400 mg by mouth daily.  08/04/16  Yes [provider]  Cholecalciferol (VITAMIN D-1000 MAX ST) 1000 units tablet Take 2,000 Units by mouth daily. 02/29/12  Yes [provider]  DULoxetine (CYMBALTA) 60 MG capsule Take 60 mg by mouth 2 (two) times daily. 07/28/16  Yes [provider]  ferrous sulfate (EQL SLOW RELEASE IRON) 160 (50 Fe) MG TBCR SR tablet Take 1 tablet (160 mg total) by mouth every other day. 02/12/17  Yes Emily Filbert, MD  gabapentin (NEURONTIN) 400 MG capsule Take 800 mg by mouth 2 (two) times daily.  07/31/16  Yes [provider]  levothyroxine (SYNTHROID, LEVOTHROID) 100 MCG tablet TAKE 1 TABLET BY MOUTH EVERY DAY 05/04/17  Yes Allegra Grana, FNP  morphine (KADIAN) 30 MG 24 hr capsule Take 30 mg by mouth 3 (three) times daily.   Yes [provider]  vitamin B-12 (CYANOCOBALAMIN) 1000 MCG tablet Take 1,000 mcg by mouth daily.   Yes [provider]  HYDROmorphone (DILAUDID) 4 MG  tablet Take 4 mg by mouth 4 (four) times daily as needed for moderate pain or severe pain.  08/15/16   [provider]  magnesium gluconate (MAGONATE) 500 MG tablet Take 500 mg by mouth daily.    [provider]  Melatonin 2.5 MG CHEW Chew 1 Dose by mouth at bedtime.    [provider]  sucralfate (CARAFATE) 1 GM/10ML suspension Take 10 mLs (1 g total) by mouth 4 (four) times daily. 05/01/17 05/01/18  Midge Minium, MD  tapentadol (NUCYNTA) 50 MG tablet Take 100 mg by mouth 2 (two) times daily.    [provider]  valACYclovir (VALTREX) 1000 MG tablet Take 1 tablet (1,000 mg total) by mouth 3 (three) times daily. 05/30/17   Payton Mccallum, MD    Family History Family History  Problem Relation Age of Onset  . Lung cancer Father   . Diabetes Mother   . Diabetes Sister   . Fibromyalgia Sister   . Colon cancer Maternal Aunt   . Skin cancer Paternal Aunt   . Skin cancer Paternal Uncle     Social History Social History  Substance Use Topics  . Smoking status: Never Smoker  . Smokeless tobacco: Never Used     Comment: tobacco use - no  . Alcohol use No     Allergies   Glipizide; Hydrocodone-acetaminophen; Lyrica [pregabalin]; Metformin; Penicillins; and Adhesive [tape]   Review of Systems Review of Systems   Physical Exam Triage Vital Signs ED Triage Vitals  Enc Vitals Group     BP 05/30/17 1508 128/72     Pulse Rate 05/30/17 1508 73     Resp 05/30/17 1508 18     Temp 05/30/17 1508 98.4 F (36.9 C)     Temp Source 05/30/17 1508 Oral     SpO2 05/30/17 1508 99 %     Weight 05/30/17 1504 180 lb (81.6 kg)     Height 05/30/17 1504  (1.651 m)     Head Circumference --      Peak Flow --      Pain Score 05/30/17 1504 5     Pain Loc --      Pain Edu? --      Excl. in GC? --    No data found.   Updated Vital Signs BP 128/72 (BP Location: Left Arm)   Pulse 73   Temp 98.4 F (36.9 C) (Oral)   Resp 18   Ht  (1.651 m)   Wt 180 lb  (81.6 kg)   LMP 12/16/2015   SpO2 99%   BMI 29.95 kg/m   Visual Acuity Right Eye Distance:   Left Eye Distance:   Bilateral Distance:    Right Eye Near:   Left Eye  Near:    Bilateral Near:     Physical Exam  Constitutional: She appears well-developed and well-nourished. No distress.  Skin: Rash noted. Rash is vesicular. She is not diaphoretic.     Nursing note and vitals reviewed.    UC Treatments / Results  Labs (all labs ordered are listed, but only abnormal results are displayed) Labs Reviewed - No data to display  EKG  EKG Interpretation None       Radiology No results found.  Procedures Procedures (including critical care time)  Medications Ordered in UC Medications - No data to display   Initial Impression / Assessment and Plan / UC Course  I have reviewed the triage vital signs and the nursing notes.  Pertinent labs & imaging results that were available during my care of the patient were reviewed by me and considered in my medical decision making (see chart for details).      Final Clinical Impressions(s) / UC Diagnoses   Final diagnoses:  Herpes zoster without complication    New Prescriptions Discharge Medication List as of 05/30/2017  3:20 PM    START taking these medications   Details  valACYclovir (VALTREX) 1000 MG tablet Take 1 tablet (1,000 mg total) by mouth 3 (three) times daily., Starting Sat 05/30/2017, Normal       1. diagnosis reviewed with patient 2. rx as per orders above; reviewed possible side effects, interactions, risks and benefits  3. Recommend supportive treatment with otc analgesics prn 4. Follow-up prn if symptoms worsen or don't improve Controlled Substance Prescriptions [place] Controlled Substance Registry consulted? Not Applicable   Payton Mccallum, MD 05/30/17 [phone]

## 2017-05-30 NOTE — ED Triage Notes (Signed)
Patient has a rash are on her left lower leg with a blister on it. Patient states that she noticed area around 3-4 days ago but has been worsening. Patient states that she has noticed redness and tenderness in the area.

## 2017-06-03 DIAGNOSIS — M19071 Primary osteoarthritis, right ankle and foot: Secondary | ICD-10-CM | POA: Diagnosis not present

## 2017-06-03 DIAGNOSIS — M25571 Pain in right ankle and joints of right foot: Secondary | ICD-10-CM | POA: Diagnosis not present

## 2017-06-04 ENCOUNTER — Ambulatory Visit: Payer: BLUE CROSS/BLUE SHIELD | Admitting: Family

## 2017-06-04 DIAGNOSIS — Z0289 Encounter for other administrative examinations: Secondary | ICD-10-CM

## 2017-06-10 ENCOUNTER — Observation Stay
Admission: EM | Admit: 2017-06-10 | Discharge: 2017-06-11 | Disposition: A | Discharge status: Home / Self Care | Payer: BLUE CROSS/BLUE SHIELD | Attending: Internal Medicine | Admitting: Internal Medicine

## 2017-06-10 ENCOUNTER — Emergency Department: Payer: BLUE CROSS/BLUE SHIELD

## 2017-06-10 ENCOUNTER — Encounter: Payer: Self-pay | Admitting: Emergency Medicine

## 2017-06-10 DIAGNOSIS — Z880 Allergy status to penicillin: Secondary | ICD-10-CM | POA: Diagnosis not present

## 2017-06-10 DIAGNOSIS — F419 Anxiety disorder, unspecified: Secondary | ICD-10-CM | POA: Insufficient documentation

## 2017-06-10 DIAGNOSIS — R4781 Slurred speech: Secondary | ICD-10-CM | POA: Diagnosis not present

## 2017-06-10 DIAGNOSIS — Z23 Encounter for immunization: Secondary | ICD-10-CM | POA: Diagnosis not present

## 2017-06-10 DIAGNOSIS — M5442 Lumbago with sciatica, left side: Secondary | ICD-10-CM | POA: Diagnosis not present

## 2017-06-10 DIAGNOSIS — G9389 Other specified disorders of brain: Secondary | ICD-10-CM | POA: Insufficient documentation

## 2017-06-10 DIAGNOSIS — Z7982 Long term (current) use of aspirin: Secondary | ICD-10-CM | POA: Diagnosis not present

## 2017-06-10 DIAGNOSIS — R4701 Aphasia: Secondary | ICD-10-CM | POA: Diagnosis not present

## 2017-06-10 DIAGNOSIS — E669 Obesity, unspecified: Secondary | ICD-10-CM | POA: Diagnosis not present

## 2017-06-10 DIAGNOSIS — E039 Hypothyroidism, unspecified: Secondary | ICD-10-CM | POA: Insufficient documentation

## 2017-06-10 DIAGNOSIS — G8929 Other chronic pain: Secondary | ICD-10-CM | POA: Diagnosis not present

## 2017-06-10 DIAGNOSIS — E785 Hyperlipidemia, unspecified: Secondary | ICD-10-CM | POA: Insufficient documentation

## 2017-06-10 DIAGNOSIS — M545 Low back pain: Secondary | ICD-10-CM | POA: Diagnosis not present

## 2017-06-10 DIAGNOSIS — G459 Transient cerebral ischemic attack, unspecified: Secondary | ICD-10-CM | POA: Diagnosis not present

## 2017-06-10 DIAGNOSIS — F329 Major depressive disorder, single episode, unspecified: Secondary | ICD-10-CM | POA: Diagnosis not present

## 2017-06-10 DIAGNOSIS — E119 Type 2 diabetes mellitus without complications: Secondary | ICD-10-CM | POA: Diagnosis not present

## 2017-06-10 DIAGNOSIS — Z9884 Bariatric surgery status: Secondary | ICD-10-CM | POA: Insufficient documentation

## 2017-06-10 DIAGNOSIS — Z79899 Other long term (current) drug therapy: Secondary | ICD-10-CM | POA: Diagnosis not present

## 2017-06-10 DIAGNOSIS — I10 Essential (primary) hypertension: Secondary | ICD-10-CM | POA: Insufficient documentation

## 2017-06-10 DIAGNOSIS — Z888 Allergy status to other drugs, medicaments and biological substances status: Secondary | ICD-10-CM | POA: Insufficient documentation

## 2017-06-10 DIAGNOSIS — R410 Disorientation, unspecified: Secondary | ICD-10-CM | POA: Diagnosis not present

## 2017-06-10 LAB — COMPREHENSIVE METABOLIC PANEL
ALT: 29 U/L (ref 14–54)
AST: 32 U/L (ref 15–41)
Albumin: 3.9 g/dL (ref 3.5–5.0)
Alkaline Phosphatase: 94 U/L (ref 38–126)
Anion gap: 9 (ref 5–15)
BUN: 9 mg/dL (ref 6–20)
CALCIUM: 9.3 mg/dL (ref 8.9–10.3)
CO2: 28 mmol/L (ref 22–32)
CREATININE: 0.84 mg/dL (ref 0.44–1.00)
Chloride: 105 mmol/L (ref 101–111)
GFR calc non Af Amer: >60 mL/min (ref >60)
GLUCOSE: 86 mg/dL (ref 65–99)
Potassium: 3.8 mmol/L (ref 3.5–5.1)
SODIUM: 142 mmol/L (ref 135–145)
Total Bilirubin: 0.4 mg/dL (ref 0.3–1.2)
Total Protein: 7.7 g/dL (ref 6.5–8.1)

## 2017-06-10 LAB — URINALYSIS, COMPLETE (UACMP) WITH MICROSCOPIC
BILIRUBIN URINE: NEGATIVE
Glucose, UA: NEGATIVE mg/dL
HGB URINE DIPSTICK: NEGATIVE
Ketones, ur: NEGATIVE mg/dL
Leukocytes, UA: NEGATIVE
NITRITE: NEGATIVE
PH: 6 (ref 5.0–8.0)
Protein, ur: NEGATIVE mg/dL
SPECIFIC GRAVITY, URINE: 1.008 (ref 1.005–1.030)

## 2017-06-10 LAB — CBC
HCT: 40.1 % (ref 35.0–47.0)
Hemoglobin: 13.3 g/dL (ref 12.0–16.0)
MCH: 30.4 pg (ref 26.0–34.0)
MCHC: 33.2 g/dL (ref 32.0–36.0)
MCV: 91.5 fL (ref 80.0–100.0)
PLATELETS: 175 10*3/uL (ref 150–440)
RBC: 4.39 MIL/uL (ref 3.80–5.20)
RDW: 19.2 % — ABNORMAL HIGH (ref 11.5–14.5)
WBC: 5.6 10*3/uL (ref 3.6–11.0)

## 2017-06-10 LAB — ETHANOL: Alcohol, Ethyl (B): <10 mg/dL (ref <10)

## 2017-06-10 LAB — URINE DRUG SCREEN, QUALITATIVE (ARMC ONLY)
Amphetamines, Ur Screen: NOT DETECTED
BARBITURATES, UR SCREEN: NOT DETECTED
Benzodiazepine, Ur Scrn: NOT DETECTED
CANNABINOID 50 NG, UR ~~LOC~~: NOT DETECTED
Cocaine Metabolite,Ur ~~LOC~~: NOT DETECTED
MDMA (Ecstasy)Ur Screen: NOT DETECTED
Methadone Scn, Ur: NOT DETECTED
Opiate, Ur Screen: NOT DETECTED
Phencyclidine (PCP) Ur S: NOT DETECTED
TRICYCLIC, UR SCREEN: POSITIVE — AB

## 2017-06-10 LAB — TROPONIN I: Troponin I: <0.03 ng/mL (ref <0.03)

## 2017-06-10 LAB — TSH: TSH: 0.816 u[IU]/mL (ref 0.350–4.500)

## 2017-06-10 MED ORDER — ENOXAPARIN SODIUM 40 MG/0.4ML ~~LOC~~ SOLN
40.0000 mg | SUBCUTANEOUS | Status: DC
  Administered 2017-06-10: 22:00:00 40 mg via SUBCUTANEOUS
  Filled 2017-06-10: qty 0.4

## 2017-06-10 MED ORDER — BUPROPION HCL ER (SR) 150 MG PO TB12
400.0000 mg | ORAL_TABLET | Freq: Every day | ORAL | Status: DC
  Administered 2017-06-11: 400 mg via ORAL
  Filled 2017-06-10: qty 1

## 2017-06-10 MED ORDER — INFLUENZA VAC SPLIT QUAD 0.5 ML IM SUSY
0.5000 mL | PREFILLED_SYRINGE | INTRAMUSCULAR | Status: AC
  Administered 2017-06-11: 0.5 mL via INTRAMUSCULAR
  Filled 2017-06-10: qty 0.5

## 2017-06-10 MED ORDER — ACETAMINOPHEN 650 MG RE SUPP: 650.0000 mg | Freq: Four times a day (QID) | RECTAL | Status: DC | PRN

## 2017-06-10 MED ORDER — ONDANSETRON HCL 4 MG/2ML IJ SOLN: 4.0000 mg | Freq: Four times a day (QID) | INTRAMUSCULAR | Status: DC | PRN

## 2017-06-10 MED ORDER — DULOXETINE HCL 30 MG PO CPEP
60.0000 mg | ORAL_CAPSULE | Freq: Two times a day (BID) | ORAL | Status: DC
  Administered 2017-06-10 – 2017-06-11 (×2): 60 mg via ORAL
  Filled 2017-06-10 (×2): qty 2

## 2017-06-10 MED ORDER — IBUPROFEN 400 MG PO TABS
600.0000 mg | ORAL_TABLET | Freq: Three times a day (TID) | ORAL | Status: DC | PRN
  Administered 2017-06-10 – 2017-06-11 (×2): 600 mg via ORAL
  Filled 2017-06-10 (×2): qty 2

## 2017-06-10 MED ORDER — ONDANSETRON HCL 4 MG PO TABS: 4.0000 mg | ORAL_TABLET | Freq: Four times a day (QID) | ORAL | Status: DC | PRN

## 2017-06-10 MED ORDER — ASPIRIN EC 81 MG PO TBEC
81.0000 mg | DELAYED_RELEASE_TABLET | Freq: Every day | ORAL | Status: DC
  Administered 2017-06-11: 81 mg via ORAL
  Filled 2017-06-10: qty 1

## 2017-06-10 MED ORDER — FERROUS SULFATE 325 (65 FE) MG PO TABS
325.0000 mg | ORAL_TABLET | Freq: Every day | ORAL | Status: DC
Start: 2017-06-11 — End: 2017-06-11
  Administered 2017-06-11: 325 mg via ORAL
  Filled 2017-06-10: qty 1

## 2017-06-10 MED ORDER — BACLOFEN 10 MG PO TABS
10.0000 mg | ORAL_TABLET | Freq: Every day | ORAL | Status: DC
  Administered 2017-06-11: 10 mg via ORAL
  Filled 2017-06-10: qty 1

## 2017-06-10 MED ORDER — GABAPENTIN 400 MG PO CAPS
800.0000 mg | ORAL_CAPSULE | Freq: Two times a day (BID) | ORAL | Status: DC
  Administered 2017-06-10 – 2017-06-11 (×2): 800 mg via ORAL
  Filled 2017-06-10 (×2): qty 2

## 2017-06-10 MED ORDER — ACETAMINOPHEN 325 MG PO TABS
650.0000 mg | ORAL_TABLET | Freq: Four times a day (QID) | ORAL | Status: DC | PRN
  Administered 2017-06-10: 650 mg via ORAL
  Filled 2017-06-10: qty 2

## 2017-06-10 MED ORDER — ASPIRIN 81 MG PO CHEW
324.0000 mg | CHEWABLE_TABLET | Freq: Once | ORAL | Status: AC
  Administered 2017-06-10: 324 mg via ORAL
  Filled 2017-06-10: qty 4

## 2017-06-10 MED ORDER — LEVOTHYROXINE SODIUM 100 MCG PO TABS
100.0000 ug | ORAL_TABLET | Freq: Every day | ORAL | Status: DC
  Administered 2017-06-11: 100 ug via ORAL
  Filled 2017-06-10: qty 1

## 2017-06-10 MED ORDER — TAPENTADOL HCL 50 MG PO TABS
100.0000 mg | ORAL_TABLET | Freq: Two times a day (BID) | ORAL | Status: DC
  Administered 2017-06-10 – 2017-06-11 (×2): 100 mg via ORAL
  Filled 2017-06-10 (×2): qty 2

## 2017-06-10 NOTE — ED Triage Notes (Signed)
Brought because she has increased confusion.  Usually does have trouble finding her words, but it is worse today.  Also not eating for 4-5 days. She says this is normal for  Her.  Her color is pale.

## 2017-06-10 NOTE — H&P (Signed)
[facility] [place] [facility]   PATIENT NAME: [name]    MR#:  [id number]  DATE OF BIRTH:  [birth year]  DATE OF ADMISSION:  06/10/2017  PRIMARY CARE PHYSICIAN: Allegra Grana, FNP   REQUESTING/REFERRING PHYSICIAN: Dr. Merrily Brittle  CHIEF COMPLAINT:   Chief Complaint  Patient presents with  . Altered Mental Status    HISTORY OF PRESENT ILLNESS:  [name]  is a 51 y.o. female with a known history of anxiety and depression, chronic low back pain on multiple pain medications, diet controlled diabetes and hypertension since her gastric bypass surgery, left-sided sciatica presents to hospital secondary to worsening expressive aphasia.   PAST MEDICAL HISTORY:   Past Medical History:  Diagnosis Date  . Anemia   . Anxiety and depression   . Chronic back pain   . DM2 (diabetes mellitus, type 2) (HCC)    improved since gastric bypass  . Headache    stress/sinus - 2-3x/wk  . HLD (hyperlipidemia)   . HTN (hypertension)    improved since gastric bypass  . Hypothyroid   . Left leg weakness    s/p back surgery  . Obesity   . S/P insertion of spinal cord stimulator   . Wears contact lenses     PAST SURGICAL HISTORY:   Past Surgical History:  Procedure Laterality Date  . Abd [place] - gallstones  5/03  . BACK SURGERY  2009   decompressive laminectomy  . BARIATRIC SURGERY  03/02/2012   gastric bypass; Dr Smitty Cords  . CHOLECYSTECTOMY  6/03  . COLONOSCOPY WITH PROPOFOL N/A 05/01/2017   Procedure: COLONOSCOPY WITH PROPOFOL;  Surgeon: Midge Minium, MD;  Location: [facility];  Service: Gastroenterology;  Laterality: N/A;  . DILATION AND CURETTAGE OF UTERUS  1/05  . ESOPHAGOGASTRODUODENOSCOPY (EGD) WITH PROPOFOL N/A 05/01/2017   Procedure: ESOPHAGOGASTRODUODENOSCOPY (EGD) WITH PROPOFOL;  Surgeon: Midge Minium, MD;  Location: [facility];  Service: Gastroenterology;  Laterality: N/A;  . KNEE SURGERY     L - arthroscopic  . LIVER  BIOPSY  6/03   fatty liver  . LUMBAR SPINE SURGERY  2015   Dr. Amalia Greenhouse  . NASAL SINUS SURGERY    . PANNICULECTOMY  08/2013   [facility], Dr. Diamond Nickel  . sleep study - apnea  04/2001   improved since gastric bypass    SOCIAL HISTORY:   Social History  Substance Use Topics  . Smoking status: Never Smoker  . Smokeless tobacco: Never Used     Comment: tobacco use - no  . Alcohol use No    FAMILY HISTORY:   Family History  Problem Relation Age of Onset  . Lung cancer Father   . Diabetes Mother   . Diabetes Sister   . Fibromyalgia Sister   . Colon cancer Maternal Aunt   . Skin cancer Paternal Aunt   . Skin cancer Paternal Uncle     DRUG ALLERGIES:   Allergies  Allergen Reactions  . Glipizide Nausea And Vomiting    REACTION: nausea  . Hydrocodone-Acetaminophen Itching  . Lyrica [Pregabalin]     Weight gain  . Metformin Diarrhea    REACTION: GI  . Penicillins Itching and Swelling    Has patient had a PCN reaction causing immediate rash, facial/tongue/throat swelling, SOB or lightheadedness with hypotension: yes Has patient had a PCN reaction causing severe rash involving mucus membranes or skin necrosis: no Has patient had a PCN reaction that required hospitalization : yes ed visit Has patient  had a PCN reaction occurring within the last 10 years: yes If all of the above answers are "NO", then may proceed with Cephalosporin use.   . Adhesive [Tape] Rash    Red, and itching    REVIEW OF SYSTEMS:   Review of Systems  Constitutional: Negative for chills, fever, malaise/fatigue and weight loss.  HENT: Negative for ear discharge, ear pain, hearing loss and nosebleeds.   Eyes: Negative for blurred vision, double vision and photophobia.  Respiratory: Negative for cough, hemoptysis, shortness of breath and wheezing.   Cardiovascular: Negative for chest pain, palpitations, orthopnea and leg swelling.  Gastrointestinal: Negative for abdominal pain, constipation, diarrhea,  heartburn, melena, nausea and vomiting.  Genitourinary: Negative for dysuria, frequency, hematuria and urgency.  Musculoskeletal: Positive for back pain and myalgias. Negative for neck pain.  Skin: Negative for rash.  Neurological: Positive for speech change. Negative for dizziness, tingling, tremors, sensory change, focal weakness, seizures and headaches.  Endo/Heme/Allergies: Does not bruise/bleed easily.  Psychiatric/Behavioral: Positive for hallucinations. Negative for depression.    MEDICATIONS AT HOME:   Prior to Admission medications   Medication Sig Start Date End Date Taking? Authorizing Provider  baclofen (LIORESAL) 10 MG tablet Take 10 mg by mouth daily. 07/28/16  Yes [provider]  buPROPion (WELLBUTRIN SR) 200 MG 12 hr tablet Take 400 mg by mouth daily.  08/04/16  Yes [provider]  DULoxetine (CYMBALTA) 60 MG capsule Take 60 mg by mouth 2 (two) times daily. 07/28/16  Yes [provider]  ferrous sulfate (EQL SLOW RELEASE IRON) 160 (50 Fe) MG TBCR SR tablet Take 1 tablet (160 mg total) by mouth every other day. 02/12/17  Yes Emily Filbert, MD  gabapentin (NEURONTIN) 400 MG capsule Take 800 mg by mouth 2 (two) times daily.  07/31/16  Yes [provider]  levothyroxine (SYNTHROID, LEVOTHROID) 100 MCG tablet TAKE 1 TABLET BY MOUTH EVERY DAY 05/04/17  Yes Allegra Grana, FNP  Melatonin 2.5 MG CHEW Chew 1 Dose by mouth at bedtime.   Yes [provider]  tapentadol (NUCYNTA) 50 MG tablet Take 100 mg by mouth 2 (two) times daily.   Yes [provider]  sucralfate (CARAFATE) 1 GM/10ML suspension Take 10 mLs (1 g total) by mouth 4 (four) times daily. Patient not taking: Reported on 06/10/2017 05/01/17 05/01/18  Midge Minium, MD  valACYclovir (VALTREX) 1000 MG tablet Take 1 tablet (1,000 mg total) by mouth 3 (three) times daily. Patient not taking: Reported on 06/10/2017 05/30/17   Payton Mccallum, MD      VITAL SIGNS:    Blood pressure 135/83, pulse 66, temperature 98 F (36.7 C), temperature source Oral, resp. rate 16, height  (1.651 m), weight 81.6 kg (180 lb), last menstrual period 12/16/2015, SpO2 100 %.  PHYSICAL EXAMINATION:   Physical Exam  GENERAL:  51 y.o.-year-old patient lying in the bed with no acute distress.  EYES: Pupils equal, round, reactive to light and accommodation. No scleral icterus. Extraocular muscles intact.  HEENT: Head atraumatic, normocephalic. Oropharynx and nasopharynx clear.  NECK:  Supple, no jugular venous distention. No thyroid enlargement, no tenderness.  LUNGS: Normal breath sounds bilaterally, no wheezing, rales,rhonchi or crepitation. No use of accessory muscles of respiration.  CARDIOVASCULAR: S1, S2 normal. No murmurs, rubs, or gallops.  ABDOMEN: Soft, nontender, nondistended. Bowel sounds present. No organomegaly or mass.  EXTREMITIES: No pedal edema, cyanosis, or clubbing.  NEUROLOGIC: Cranial nerves II through XII are intact. Muscle strength 5/5 in all extremities. Sensation  intact. Gait not checked.  Speech is normal now PSYCHIATRIC: The patient is alert and oriented x 3.  SKIN: No obvious rash, lesion, or ulcer.   LABORATORY PANEL:   CBC  Recent Labs Lab 06/10/17 1253  WBC 5.6  HGB 13.3  HCT 40.1  PLT 175   ------------------------------------------------------------------------------------------------------------------  Chemistries   Recent Labs Lab 06/10/17 1253  NA 142  K 3.8  CL 105  CO2 28  GLUCOSE 86  BUN 9  CREATININE 0.84  CALCIUM 9.3  AST 32  ALT 29  ALKPHOS 94  BILITOT 0.4   ------------------------------------------------------------------------------------------------------------------  Cardiac Enzymes  Recent Labs Lab 06/10/17 1253  TROPONINI <0.03   ------------------------------------------------------------------------------------------------------------------  RADIOLOGY:  Ct Head Wo Contrast  Result  Date: 06/10/2017 CLINICAL DATA:  Increased confusion EXAM: CT HEAD WITHOUT CONTRAST TECHNIQUE: Contiguous axial images were obtained from the base of the skull through the vertex without intravenous contrast. COMPARISON:  01/02/2017, 08/16/2016, MRI 09/27/2010 FINDINGS: Brain: No acute territorial infarction, hemorrhage or intracranial mass is visualized. Chronic encephalomalacia with calcification in the right frontal lobe. 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 Ex vacuo dilatation of the right lateral ventricle as before. Vascular: No hyperdense vessels. Scattered calcification at the carotid siphon Skull: No fracture or suspicious bone lesion Sinuses/Orbits: No acute finding. Other: None IMPRESSION: 1. No CT evidence for acute intracranial abnormality. 2. 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   On: 06/10/2017 15:30    EKG:   Orders placed or performed during the hospital encounter of 06/10/17  . ED EKG  . ED EKG  . EKG 12-Lead  . EKG 12-Lead    IMPRESSION AND PLAN:   Tniya Bowditch  is a 51 y.o. female with a known history of anxiety and depression, chronic low back pain on multiple pain medications, diet controlled diabetes and hypertension since her gastric bypass surgery, left-sided sciatica presents to hospital secondary to worsening expressive aphasia.  #1 TIA- neurological symptoms, intermittent - also hallucinations on and off for a month - could be her multiple meds- exacerbated by stress - neuro consult - neuro checks, ultrasound carotids and also echocardiogram. -MRI cannot be done due to a spinal stimulator. Since the symptoms are subacute, MRI with and without contrast came me done as outpatient if her pain management clinic can turn her spinal stimulator off. -started on aspirin for now. Check lipid panel -Physical therapy and occupational therapy consult requested  #2 chronic low back pain-following with  physiatrist and also pain management clinic in [place] -Continue her home medications.on Cymbalta, gabapentin and Nucynta  #3 depression-continue Wellbutrin, Cymbalta  #4 hypothyroidism-continue Synthroid  #5 DVT prophylaxis-on Lovenox   All the records are reviewed and case discussed with ED provider. Management plans discussed with the patient, family and they are in agreement.  CODE STATUS: Full Code  TOTAL TIME TAKING CARE OF THIS PATIENT: 50 minutes.    Enid Baas M.D on 06/10/2017 at 6:53 PM  Between 7am to 6pm - Pager - [phone]  After 6pm go to [email or web address] - Social research officer, government  [facility]  Office  [phone]  CC: Primary care physician; Allegra Grana, FNP

## 2017-06-10 NOTE — ED Provider Notes (Signed)
[facility] Emergency Department Provider Note  ____________________________________________   First MD Initiated Contact with Patient 06/10/17 1511     (approximate)  I have reviewed the triage vital signs and the nursing notes.   HISTORY  Chief Complaint Altered Mental Status   HPI [name] is a 51 y.o. female who self presents to the emergency Department with roughly 2 hours of word finding difficulty and difficulty speaking. It began all around 10 AM roughly6 hours prior to arrival. It subsided on its own shortly before coming to the emergency department. She has a long-standing history of diabetes, hypertension, dyslipidemia, although most of these have resolved after gastric bypass surgery 5 years ago. She does say she has intermittent episodes of word finding difficulty for the past several months. She's never had numbness or weakness. She's never had double vision or blurred vision. She denies chest pain shortness of breath abdominal pain nausea or vomiting. Her symptoms began rather suddenly while she was at work this seemed to go away on their own.   Past Medical History:  Diagnosis Date  . Anemia   . Anxiety and depression   . Chronic back pain   . DM2 (diabetes mellitus, type 2) (HCC)    improved since gastric bypass  . Headache    stress/sinus - 2-3x/wk  . HLD (hyperlipidemia)   . HTN (hypertension)    improved since gastric bypass  . Hypothyroid   . Left leg weakness    s/p back surgery  . Obesity   . S/P insertion of spinal cord stimulator   . Wears contact lenses     Patient Active Problem List   Diagnosis Date Noted  . TIA (transient ischemic attack) 06/10/2017  . Bariatric surgery status   . Gastrointestinal ulcer   . Iron deficiency anemia 04/21/2017  . Nonintractable headache 02/23/2017  . Narcotic overdose 01/12/2017  . Tremor 10/29/2016  . Benign paroxysmal positional vertigo 08/21/2016  . Dizziness  05/15/2015  . Right-sided low back pain without sciatica 10/27/2014  . Hyperpigmentation 05/05/2014  . Viral wart on finger 05/05/2014  . Gluteal pain 12/28/2013  . Elevated LFTs 12/13/2013  . Depression with anxiety 10/26/2012  . Anemia 05/05/2012  . Hypothyroidism 10/28/2007  . HYPERCHOLESTEROLEMIA, PURE 05/21/2007    Past Surgical History:  Procedure Laterality Date  . Abd [place] - gallstones  5/03  . BACK SURGERY  2009   decompressive laminectomy  . BARIATRIC SURGERY  03/02/2012   gastric bypass; Dr Smitty Cords  . CHOLECYSTECTOMY  6/03  . COLONOSCOPY WITH PROPOFOL N/A 05/01/2017   Procedure: COLONOSCOPY WITH PROPOFOL;  Surgeon: Midge Minium, MD;  Location: [facility];  Service: Gastroenterology;  Laterality: N/A;  . DILATION AND CURETTAGE OF UTERUS  1/05  . ESOPHAGOGASTRODUODENOSCOPY (EGD) WITH PROPOFOL N/A 05/01/2017   Procedure: ESOPHAGOGASTRODUODENOSCOPY (EGD) WITH PROPOFOL;  Surgeon: Midge Minium, MD;  Location: [facility];  Service: Gastroenterology;  Laterality: N/A;  . KNEE SURGERY     L - arthroscopic  . LIVER BIOPSY  6/03   fatty liver  . LUMBAR SPINE SURGERY  2015   Dr. Amalia Greenhouse  . NASAL SINUS SURGERY    . PANNICULECTOMY  08/2013   [facility], Dr. Diamond Nickel  . sleep study - apnea  04/2001   improved since gastric bypass    Prior to Admission medications   Medication Sig Start Date End Date Taking? Authorizing Provider  baclofen (LIORESAL) 10 MG tablet Take 10 mg by mouth daily. 07/28/16  Yes  [provider]  buPROPion (WELLBUTRIN SR) 200 MG 12 hr tablet Take 400 mg by mouth daily.  08/04/16  Yes [provider]  DULoxetine (CYMBALTA) 60 MG capsule Take 60 mg by mouth 2 (two) times daily. 07/28/16  Yes [provider]  ferrous sulfate (EQL SLOW RELEASE IRON) 160 (50 Fe) MG TBCR SR tablet Take 1 tablet (160 mg total) by mouth every other day. 02/12/17  Yes Emily Filbert, MD  gabapentin (NEURONTIN) 400 MG capsule Take 800 mg by mouth  2 (two) times daily.  07/31/16  Yes [provider]  levothyroxine (SYNTHROID, LEVOTHROID) 100 MCG tablet TAKE 1 TABLET BY MOUTH EVERY DAY 05/04/17  Yes Allegra Grana, FNP  Melatonin 2.5 MG CHEW Chew 1 Dose by mouth at bedtime.   Yes [provider]  tapentadol (NUCYNTA) 50 MG tablet Take 100 mg by mouth 2 (two) times daily.   Yes [provider]  sucralfate (CARAFATE) 1 GM/10ML suspension Take 10 mLs (1 g total) by mouth 4 (four) times daily. Patient not taking: Reported on 06/10/2017 05/01/17 05/01/18  Midge Minium, MD  valACYclovir (VALTREX) 1000 MG tablet Take 1 tablet (1,000 mg total) by mouth 3 (three) times daily. Patient not taking: Reported on 06/10/2017 05/30/17   Payton Mccallum, MD    Allergies Glipizide; Hydrocodone-acetaminophen; Lyrica [pregabalin]; Metformin; Penicillins; and Adhesive [tape]  Family History  Problem Relation Age of Onset  . Lung cancer Father   . Diabetes Mother   . Diabetes Sister   . Fibromyalgia Sister   . Colon cancer Maternal Aunt   . Skin cancer Paternal Aunt   . Skin cancer Paternal Uncle     Social History Social History  Substance Use Topics  . Smoking status: Never Smoker  . Smokeless tobacco: Never Used     Comment: tobacco use - no  . Alcohol use No    Review of Systems Constitutional: No fever/chills Eyes: No visual changes. ENT: No sore throat. Cardiovascular: Denies chest pain. Respiratory: Denies shortness of breath. Gastrointestinal: No abdominal pain.  No nausea, no vomiting.  No diarrhea.  No constipation. Genitourinary: Negative for dysuria. Musculoskeletal: Negative for back pain. Skin: Negative for rash. Neurological: Negative for headaches, focal weakness or numbness.   ____________________________________________   PHYSICAL EXAM:  VITAL SIGNS: ED Triage Vitals [06/10/17 1250]  Enc Vitals Group     BP (!) 139/92     Pulse Rate 66     Resp 16     Temp 98 F (36.7 C)     Temp  Source Oral     SpO2 100 %     Weight 180 lb (81.6 kg)     Height  (1.651 m)     Head Circumference      Peak Flow      Pain Score      Pain Loc      Pain Edu?      Excl. in GC?     Constitutional: alert and oriented 4 appropriate cooperative speaks in full clear sentences no diaphoresis Eyes: PERRL EOMI. pupils are midrange and brisk bilaterally Head: Atraumatic. Nose: No congestion/rhinnorhea. Mouth/Throat: No trismus Neck: No stridor.   Cardiovascular: Normal rate, regular rhythm. Grossly normal heart sounds.  Good peripheral circulation. Respiratory: Normal respiratory effort.  No retractions. Lungs CTAB and moving good air Gastrointestinal: soft nontender Musculoskeletal: No lower extremity edema   Neurologic:  Alert and oriented 4 Cranial nerves II through XII intact No pronator drift 5 out  of 5 grips, biceps, triceps, hip flexion, hip extension plantar flexion, dorsiflexion Sensation intact to light touch throughout 2+ DTRs and no ankle clonus Normal finger-nose-finger Ambulates with steady gait  Skin:  Skin is warm, dry and intact. No rash noted. Psychiatric: Mood and affect are normal. Speech and behavior are normal.    ____________________________________________   DIFFERENTIAL includes but not limited to  TIA, stroke, pseudotumor, intracerebral hemorrhage, intracerebral mass, metabolic arrangement, urinary tract infection, hypothyroidism ____________________________________________   LABS (all labs ordered are listed, but only abnormal results are displayed)  Labs Reviewed  CBC - Abnormal; Notable for the following:       Result Value   RDW 19.2 (*)    All other components within normal limits  URINE DRUG SCREEN, QUALITATIVE ([facility] ONLY) - Abnormal; Notable for the following:    Tricyclic, Ur Screen POSITIVE (*)    All other components within normal limits  URINALYSIS, COMPLETE (UACMP) WITH MICROSCOPIC - Abnormal; Notable for the following:     Color, Urine YELLOW (*)    APPearance CLEAR (*)    Bacteria, UA RARE (*)    Squamous Epithelial / LPF 0-5 (*)    All other components within normal limits  COMPREHENSIVE METABOLIC PANEL  ETHANOL  TSH  TROPONIN I    blood work reviewed and interpreted by me is unremarkable __________________________________________  EKG  ED ECG REPORT I, Merrily Brittle, the attending physician, personally viewed and interpreted this ECG.  Date: 06/10/2017 EKG Time:  Rate: 58 Rhythm: sinus bradycardia QRS Axis: normal Intervals: normal ST/T Wave abnormalities: large T wave nearly the size of the QRS complex in leads 2 and V5, not in contiguous leads. Somewhat concerning for hyperacute T waves Narrative Interpretation: concerning T waves in leads 2 and V5 although no ST elevation or depression abnormal EKG  ____________________________________________  RADIOLOGY  head CT with right frontal chronic encephalomalacia but no acute disease ____________________________________________   PROCEDURES  Procedure(s) performed: no  Procedures  Critical Care performed: no  Observation: no ____________________________________________   INITIAL IMPRESSION / ASSESSMENT AND PLAN / ED COURSE  Pertinent labs & imaging results that were available during my care of the patient were reviewed by me and considered in my medical decision making (see chart for details).  The patient arrives hemodynamically stable and neuro intact, however her history is concerning for a transient ischemic attack given the slurred speech and word finding difficulties. CT scan and blood work are pending.      ________----------------------------------------- 5:41 PM on 06/10/2017 -----------------------------------------  The patient remains neuro intact and feels well. Her head CT shows old encephalomalacia that is been present since at least 2011. Previous head CT was felt to represent prior infarct although the  patient does not recall this and does not know if she's never had a stroke.____________________________________   I discussed the case with on-call neurologist from [facility] who recommends inpatient admission for TIA workup given the patient's previous stroke at a young age. The patient verbalizes understanding and agreement with the plan.  FINAL CLINICAL IMPRESSION(S) / ED DIAGNOSES  Final diagnoses:  Transient cerebral ischemia, unspecified type      NEW MEDICATIONS STARTED DURING THIS VISIT:  New Prescriptions   No medications on file     Note:  This document was prepared using Dragon voice recognition software and may include unintentional dictation errors.     Merrily Brittle, MD 06/10/17 Nicholos Johns

## 2017-06-11 ENCOUNTER — Observation Stay (HOSPITAL_BASED_OUTPATIENT_CLINIC_OR_DEPARTMENT_OTHER)
Admit: 2017-06-11 | Discharge: 2017-06-11 | Disposition: A | Discharge status: Home / Self Care | Payer: BLUE CROSS/BLUE SHIELD | Attending: Internal Medicine | Admitting: Internal Medicine

## 2017-06-11 ENCOUNTER — Telehealth: Payer: Self-pay | Admitting: Family

## 2017-06-11 ENCOUNTER — Observation Stay: Payer: BLUE CROSS/BLUE SHIELD

## 2017-06-11 DIAGNOSIS — T50905A Adverse effect of unspecified drugs, medicaments and biological substances, initial encounter: Secondary | ICD-10-CM | POA: Diagnosis not present

## 2017-06-11 DIAGNOSIS — R4789 Other speech disturbances: Secondary | ICD-10-CM

## 2017-06-11 DIAGNOSIS — I639 Cerebral infarction, unspecified: Secondary | ICD-10-CM | POA: Diagnosis not present

## 2017-06-11 DIAGNOSIS — M545 Low back pain: Secondary | ICD-10-CM | POA: Diagnosis not present

## 2017-06-11 DIAGNOSIS — G459 Transient cerebral ischemic attack, unspecified: Secondary | ICD-10-CM | POA: Diagnosis not present

## 2017-06-11 DIAGNOSIS — I10 Essential (primary) hypertension: Secondary | ICD-10-CM | POA: Diagnosis not present

## 2017-06-11 DIAGNOSIS — E039 Hypothyroidism, unspecified: Secondary | ICD-10-CM | POA: Diagnosis not present

## 2017-06-11 LAB — BASIC METABOLIC PANEL
Anion gap: 7 (ref 5–15)
BUN: 12 mg/dL (ref 6–20)
CHLORIDE: 109 mmol/L (ref 101–111)
CO2: 26 mmol/L (ref 22–32)
Calcium: 8.6 mg/dL — ABNORMAL LOW (ref 8.9–10.3)
Creatinine, Ser: 0.8 mg/dL (ref 0.44–1.00)
GFR calc non Af Amer: >60 mL/min (ref >60)
Glucose, Bld: 78 mg/dL (ref 65–99)
POTASSIUM: 3.7 mmol/L (ref 3.5–5.1)
SODIUM: 142 mmol/L (ref 135–145)

## 2017-06-11 LAB — LIPID PANEL
CHOL/HDL RATIO: 2.4 ratio
CHOLESTEROL: 80 mg/dL (ref 0–200)
HDL: 33 mg/dL — AB (ref >40)
LDL Cholesterol: 38 mg/dL (ref 0–99)
TRIGLYCERIDES: 43 mg/dL (ref <150)
VLDL: 9 mg/dL (ref 0–40)

## 2017-06-11 LAB — HEMOGLOBIN A1C
HEMOGLOBIN A1C: 4.9 % (ref 4.8–5.6)
MEAN PLASMA GLUCOSE: 93.93 mg/dL

## 2017-06-11 LAB — CBC
HEMATOCRIT: 36.4 % (ref 35.0–47.0)
HEMOGLOBIN: 12.1 g/dL (ref 12.0–16.0)
MCH: 30.4 pg (ref 26.0–34.0)
MCHC: 33.4 g/dL (ref 32.0–36.0)
MCV: 91 fL (ref 80.0–100.0)
Platelets: 164 10*3/uL (ref 150–440)
RBC: 3.99 MIL/uL (ref 3.80–5.20)
RDW: 18.9 % — ABNORMAL HIGH (ref 11.5–14.5)
WBC: 5.1 10*3/uL (ref 3.6–11.0)

## 2017-06-11 MED ORDER — ASPIRIN EC 81 MG PO TBEC: 81.0000 mg | DELAYED_RELEASE_TABLET | Freq: Every day | ORAL | 11 refills | Status: AC

## 2017-06-11 NOTE — Care Management (Signed)
RNCM spoke with patient's female friend and she states that PT just worked with patient and she did well and without any ambulatory equipment. They do not feel they will need PT. No current RNCM needs.

## 2017-06-11 NOTE — Progress Notes (Signed)
[employer] [facility] - [place] [facility]                                                                                                                                                                                  Patient Demographics   [name], is a 51 y.o. female, DOB - 1966/03/21, RUE:[id number]  Admit date - 06/10/2017   Admitting Physician Enid Baas, MD  Outpatient Primary MD for the patient is Arnett, Lyn Records, FNP   LOS - 0  Subjective: Patient presented with difficulty with speech Patient states that this is been going on and off for the past 1 month. She is on multiple medications that could be causing the symptoms  Review of Systems:   CONSTITUTIONAL: No documented fever. No fatigue, weakness. No weight gain, no weight loss.  EYES: No blurry or double vision.  ENT: No tinnitus. No postnasal drip. No redness of the oropharynx.  RESPIRATORY: No cough, no wheeze, no hemoptysis. No dyspnea.  CARDIOVASCULAR: No chest pain. No orthopnea. No palpitations. No syncope.  GASTROINTESTINAL: No nausea, no vomiting or diarrhea. No abdominal pain. No melena or hematochezia.  GENITOURINARY: No dysuria or hematuria.  ENDOCRINE: No polyuria or nocturia. No heat or cold intolerance.  HEMATOLOGY: No anemia. No bruising. No bleeding.  INTEGUMENTARY: No rashes. No lesions.  MUSCULOSKELETAL: No arthritis. No swelling. No gout.  NEUROLOGIC: No numbness, tingling, or ataxia. No seizure-type activity.  Positive slurred speech PSYCHIATRIC: No anxiety. No insomnia. No ADD.    Vitals:   Vitals:   06/10/17 2357 06/11/17 0406 06/11/17 0734 06/11/17 1326  BP: 118/62 114/60 119/68 109/64  Pulse: 77 67 70 80  Resp: Temp: (!) 97.5 F (36.4 C) 97.9 F (36.6 C) 98.4 F (36.9 C) 98.4 F (36.9 C)  TempSrc: Oral Oral Oral Oral  SpO2: 100% 100% 100% 99%  Weight:      Height:        Wt Readings from Last 3 Encounters:  06/10/17 182 lb 1.6 oz (82.6 kg)   05/30/17 180 lb (81.6 kg)  05/01/17 188 lb (85.3 kg)     Intake/Output Summary (Last 24 hours) at 06/11/17 1403 Last data filed at 06/11/17 1209  Gross per 24 hour  Intake              237 ml  Output                0 ml  Net              237 ml    Physical Exam:   GENERAL: Pleasant-appearing in no apparent distress.  HEAD, EYES, EARS, NOSE AND  THROAT: Atraumatic, normocephalic. Extraocular muscles are intact. Pupils equal and reactive to light. Sclerae anicteric. No conjunctival injection. No oro-pharyngeal erythema.  NECK: Supple. There is no jugular venous distention. No bruits, no lymphadenopathy, no thyromegaly.  HEART: Regular rate and rhythm,. No murmurs, no rubs, no clicks.  LUNGS: Clear to auscultation bilaterally. No rales or rhonchi. No wheezes.  ABDOMEN: Soft, flat, nontender, nondistended. Has good bowel sounds. No hepatosplenomegaly appreciated.  EXTREMITIES: No evidence of any cyanosis, clubbing, or peripheral edema.  +2 pedal and radial pulses bilaterally.  NEUROLOGIC: The patient is alert, awake, and oriented x3 with no focal motor or sensory deficits appreciated bilaterally.  SKIN: Moist and warm with no rashes appreciated.  Psych: Not anxious, depressed LN: No inguinal LN enlargement    Antibiotics   Anti-infectives    None      Medications   Scheduled Meds: . aspirin EC  81 mg Oral Daily  . baclofen  10 mg Oral Daily  . buPROPion  400 mg Oral Daily  . DULoxetine  60 mg Oral BID  . enoxaparin (LOVENOX) injection  40 mg Subcutaneous Q24H  . ferrous sulfate  325 mg Oral Q breakfast  . gabapentin  800 mg Oral BID  . levothyroxine  100 mcg Oral QAC breakfast  . tapentadol  100 mg Oral BID   Continuous Infusions: PRN Meds:.acetaminophen **OR** acetaminophen, ibuprofen, ondansetron **OR** ondansetron (ZOFRAN) IV   Data Review:   Micro Results No results found for this or any previous visit (from the past 240 hour(s)).  Radiology Reports Ct Head  Wo Contrast  Result Date: 06/10/2017 CLINICAL DATA:  Increased confusion EXAM: CT HEAD WITHOUT CONTRAST TECHNIQUE: Contiguous axial images were obtained from the base of the skull through the vertex without intravenous contrast. COMPARISON:  01/02/2017, 08/16/2016, MRI 09/27/2010 FINDINGS: Brain: No acute territorial infarction, hemorrhage or intracranial mass is visualized. Chronic encephalomalacia with calcification in the right frontal lobe. 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 Ex vacuo dilatation of the right lateral ventricle as before. Vascular: No hyperdense vessels. Scattered calcification at the carotid siphon Skull: No fracture or suspicious bone lesion Sinuses/Orbits: No acute finding. Other: None IMPRESSION: 1. No CT evidence for acute intracranial abnormality. 2. 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   On: 06/10/2017 15:30   US Carotid Bilateral  Result Date: 06/11/2017 CLINICAL DATA:  TIA. EXAM: BILATERAL CAROTID DUPLEX ULTRASOUND TECHNIQUE: Wallace Cullens scale imaging, color Doppler and duplex ultrasound were performed of bilateral carotid and vertebral arteries in the neck. COMPARISON:  CT 06/10/2017 . FINDINGS: Criteria: Quantification of carotid stenosis is based on velocity parameters that correlate the residual internal carotid diameter with NASCET-based stenosis levels, using the diameter of the distal internal carotid lumen as the denominator for stenosis measurement. The following velocity measurements were obtained: RIGHT ICA:  77/31 cm/sec CCA:  97/17 cm/sec SYSTOLIC ICA/CCA RATIO:  0.8 DIASTOLIC ICA/CCA RATIO:  1.8 ECA:  91 cm/sec LEFT ICA:  68/16 cm/sec CCA:  100/17 cm/sec SYSTOLIC ICA/CCA RATIO:  0.7 DIASTOLIC ICA/CCA RATIO:  1.0 ECA:  93 cm/sec RIGHT CAROTID ARTERY: No significant carotid atherosclerotic vascular disease. No flow limiting stenosis. RIGHT VERTEBRAL ARTERY:  Patent with antegrade flow. LEFT CAROTID ARTERY:  No significant carotid atherosclerotic vascular disease. LEFT VERTEBRAL ARTERY:  Patent with antegrade flow . IMPRESSION: 1. No significant carotid atherosclerotic vascular disease. 2. Vertebrals are patent antegrade flow. Electronically Signed   By: Maisie Fus  Register   On: 06/11/2017  10:41     CBC  Recent Labs Lab 06/10/17 1253 06/11/17 0608  WBC 5.6 5.1  HGB 13.3 12.1  HCT 40.1 36.4  PLT 175 164  MCV 91.5 91.0  MCH 30.4 30.4  MCHC 33.2 33.4  RDW 19.2* 18.9*    Chemistries   Recent Labs Lab 06/10/17 1253 06/11/17 0608  NA 142 142  K 3.8 3.7  CL 105 109  CO2 28 26  GLUCOSE 86 78  BUN 9 12  CREATININE 0.84 0.80  CALCIUM 9.3 8.6*  AST 32  --   ALT 29  --   ALKPHOS 94  --   BILITOT 0.4  --    ------------------------------------------------------------------------------------------------------------------ estimated creatinine clearance is 88.3 mL/min (by C-G formula based on SCr of 0.8 mg/dL). ------------------------------------------------------------------------------------------------------------------  Recent Labs  06/11/17 0608  HGBA1C 4.9   ------------------------------------------------------------------------------------------------------------------  Recent Labs  06/11/17 0608  CHOL 80  HDL 33*  LDLCALC 38  TRIG 43  CHOLHDL 2.4   ------------------------------------------------------------------------------------------------------------------  Recent Labs  06/10/17 1554  TSH 0.816   ------------------------------------------------------------------------------------------------------------------ No results for input(s): VITAMINB12, FOLATE, FERRITIN, TIBC, IRON, RETICCTPCT in the last 72 hours.  Coagulation profile No results for input(s): INR, PROTIME in the last 168 hours.  No results for input(s): DDIMER in the last 72 hours.  Cardiac Enzymes  Recent Labs Lab 06/10/17 1253  TROPONINI <0.03    ------------------------------------------------------------------------------------------------------------------ Invalid input(s): POCBNP    Assessment & Plan   [name]  is a 51 y.o. female with a known history of anxiety and depression, chronic low back pain on multiple pain medications, diet controlled diabetes and hypertension since her gastric bypass surgery, left-sided sciatica presents to hospital secondary to worsening expressive aphasia.  #1 slurred speech feel this is more related to medications than a TIA. Awaiting neurology input CT of the head negative carotid Dopplers nonrevealing Aspirin therapy Possible discharge after neurology evaluation PT OT recommends no further follow-up Patient needs her medication adjustment as outpatient  #2 chronic low back pain-following with physiatrist and also pain management clinic in [place] -Continue her home medications.on Cymbalta, gabapentin and Nucynta  #3 depression-continue Wellbutrin, Cymbalta  #4 hypothyroidism-continue Synthroid  #5 DVT prophylaxis-on Lovenox       Code Status Orders        Start     Ordered   06/10/17 2127  Full code  Continuous     06/10/17 2127    Code Status History    Date Active Date Inactive Code Status Order ID Comments User Context   This patient has a current code status but no historical code status.           Consults neuro  DVT Prophylaxis  Lovenox  Lab Results  Component Value Date   PLT 164 06/11/2017     Time Spent in minutes   35 minutes Greater than 50% of time spent in care coordination and counseling patient regarding the condition and plan of care.   Auburn Bilberry M.D on 06/11/2017 at 2:03 PM  Between 7am to 6pm - Pager - [phone]  After 6pm go to [email or web address] - password [email or web address] [facility]  [facility] [place] [facility]   Office  [phone]

## 2017-06-11 NOTE — Progress Notes (Signed)
MD order received in [facility] to discharge pt home today; verbally reviewed AVS with pt, gave Rx for Aspirin and return to work note to pt; no questions voiced at this time; pt discharged via wheelchair by nursing to the visitor's entrance

## 2017-06-11 NOTE — Telephone Encounter (Signed)
FYI

## 2017-06-11 NOTE — Consult Note (Signed)
Reason for Consult:Difficulty with speech Referring Physician: Allena Katz  CC: Difficulty with speech  HPI: [name] is an 51 y.o. female with a history of chronic pain who reports that for the past two years she has undergone multiple medication changes in an attempt to control her pain.  The last was a few weeks ago when Elavil was initiated.  Patient reports that for the past 6-8 months things have been progressively worsening.  She has noticed tremors intermittently.  Her balance has been off.  She feels loopy at times and is confused at times as well.  Has noticed dry mouth and at times ahs difficulty getting her words out.  She reports going to work on yesterday and having another episode of being unable to get her words out.  Symptoms resolved prior to presentation.     Past Medical History:  Diagnosis Date  . Anemia   . Anxiety and depression   . Chronic back pain   . DM2 (diabetes mellitus, type 2) (HCC)    improved since gastric bypass  . Headache    stress/sinus - 2-3x/wk  . HLD (hyperlipidemia)   . HTN (hypertension)    improved since gastric bypass  . Hypothyroid   . Left leg weakness    s/p back surgery  . Obesity   . S/P insertion of spinal cord stimulator   . Wears contact lenses     Past Surgical History:  Procedure Laterality Date  . Abd [place] - gallstones  5/03  . BACK SURGERY  2009   decompressive laminectomy  . BARIATRIC SURGERY  03/02/2012   gastric bypass; Dr Smitty Cords  . CHOLECYSTECTOMY  6/03  . COLONOSCOPY WITH PROPOFOL N/A 05/01/2017   Procedure: COLONOSCOPY WITH PROPOFOL;  Surgeon: Midge Minium, MD;  Location: [facility];  Service: Gastroenterology;  Laterality: N/A;  . DILATION AND CURETTAGE OF UTERUS  1/05  . ESOPHAGOGASTRODUODENOSCOPY (EGD) WITH PROPOFOL N/A 05/01/2017   Procedure: ESOPHAGOGASTRODUODENOSCOPY (EGD) WITH PROPOFOL;  Surgeon: Midge Minium, MD;  Location: [facility];  Service: Gastroenterology;  Laterality: N/A;  . KNEE  SURGERY     L - arthroscopic  . LIVER BIOPSY  6/03   fatty liver  . LUMBAR SPINE SURGERY  2015   Dr. Amalia Greenhouse  . NASAL SINUS SURGERY    . PANNICULECTOMY  08/2013   [facility], Dr. Diamond Nickel  . sleep study - apnea  04/2001   improved since gastric bypass    Family History  Problem Relation Age of Onset  . Lung cancer Father   . Diabetes Mother   . Diabetes Sister   . Fibromyalgia Sister   . Colon cancer Maternal Aunt   . Skin cancer Paternal Aunt   . Skin cancer Paternal Uncle     Social History:  reports that she has never smoked. She has never used smokeless tobacco. She reports that she does not drink alcohol or use drugs.  Allergies  Allergen Reactions  . Glipizide Nausea And Vomiting    REACTION: nausea  . Hydrocodone-Acetaminophen Itching  . Lyrica [Pregabalin]     Weight gain  . Metformin Diarrhea    REACTION: GI  . Penicillins Itching and Swelling    Has patient had a PCN reaction causing immediate rash, facial/tongue/throat swelling, SOB or lightheadedness with hypotension: yes Has patient had a PCN reaction causing severe rash involving mucus membranes or skin necrosis: no Has patient had a PCN reaction that required hospitalization : yes ed visit Has patient had a PCN  reaction occurring within the last 10 years: yes If all of the above answers are "NO", then may proceed with Cephalosporin use.   . Adhesive [Tape] Rash    Red, and itching    Medications:  I have reviewed the patient's current medications. Prior to Admission:  ASA, Lioresal, Wellbutrin, Cymbalta, Neurontin, Synthroid, Nucynta, Valtrex    ROS: History obtained from the patient  General ROS: difficulty sleeping Psychological ROS: depression Ophthalmic ROS: negative for - blurry vision, double vision, eye pain or loss of vision ENT ROS: dry mouth Allergy and Immunology ROS: negative for - hives or itchy/watery eyes Hematological and Lymphatic ROS: negative for - bleeding problems, bruising or  swollen lymph nodes Endocrine ROS: negative for - galactorrhea, hair pattern changes, polydipsia/polyuria or temperature intolerance Respiratory ROS: negative for - cough, hemoptysis, shortness of breath or wheezing Cardiovascular ROS: negative for - chest pain, dyspnea on exertion, edema or irregular heartbeat Gastrointestinal ROS: negative for - abdominal pain, diarrhea, hematemesis, nausea/vomiting or stool incontinence Genito-Urinary ROS: negative for - dysuria, hematuria, incontinence or urinary frequency/urgency Musculoskeletal ROS: chronic back pain Neurological ROS: as noted in HPI Dermatological ROS: negative for rash and skin lesion changes  Physical Examination: Blood pressure 109/64, pulse 80, temperature 98.4 F (36.9 C), temperature source Oral, resp. rate 14, height  (1.651 m), weight 82.6 kg (182 lb 1.6 oz), last menstrual period 12/16/2015, SpO2 99 %.  HEENT-  Normocephalic, no lesions, without obvious abnormality.  Normal external eye and conjunctiva.  Normal TM's bilaterally.  Normal auditory canals and external ears. Normal external nose, mucus membranes and septum.  Normal pharynx. Cardiovascular- S1, S2 normal, pulses palpable throughout   Lungs- chest clear, no wheezing, rales, normal symmetric air entry Abdomen- soft, non-tender; bowel sounds normal; no masses,  no organomegaly Extremities- no edema Lymph-no adenopathy palpable Musculoskeletal-no joint tenderness, deformity or swelling Skin-LE bruising  Neurological Examination   Mental Status: Alert, oriented, thought content appropriate.  Speech fluent without evidence of aphasia.  Able to follow 3 step commands without difficulty. Cranial Nerves: II: Discs flat bilaterally; Visual fields grossly normal, pupils equal, round, reactive to light and accommodation III,IV, VI: ptosis not present, extra-ocular motions intact bilaterally V,VII: smile symmetric, facial light touch sensation normal  bilaterally VIII: hearing normal bilaterally IX,X: gag reflex present XI: bilateral shoulder shrug XII: midline tongue extension Motor: Right : Upper extremity   5/5    Left:     Upper extremity   5/5, some limitation due to pain  Lower extremity   5/5     Lower extremity   5/5 Tone and bulk:normal tone throughout; no atrophy noted Sensory: Pinprick and light touch intact throughout, bilaterally Deep Tendon Reflexes: 1+ and symmetric with absent AJ's bilaterally Plantars: Right: mute   Left: mute Cerebellar: Normal finger-to-nose and normal heel-to-shin testing bilaterally Gait: not tested due to safety concerns    Laboratory Studies:   Basic Metabolic Panel:  Recent Labs Lab 06/10/17 1253 06/11/17 0608  NA 142 142  K 3.8 3.7  CL 105 109  CO2 28 26  GLUCOSE 86 78  BUN 9 12  CREATININE 0.84 0.80  CALCIUM 9.3 8.6*    Liver Function Tests:  Recent Labs Lab 06/10/17 1253  AST 32  ALT 29  ALKPHOS 94  BILITOT 0.4  PROT 7.7  ALBUMIN 3.9   No results for input(s): LIPASE, AMYLASE in the last 168 hours. No results for input(s): AMMONIA in the last 168 hours.  CBC:  Recent Labs Lab  06/10/17 1253 06/11/17 0608  WBC 5.6 5.1  HGB 13.3 12.1  HCT 40.1 36.4  MCV 91.5 91.0  PLT 175 164    Cardiac Enzymes:  Recent Labs Lab 06/10/17 1253  TROPONINI <0.03    BNP: Invalid input(s): POCBNP  CBG: No results for input(s): GLUCAP in the last 168 hours.  Microbiology: Results for orders placed or performed in visit on 06/03/16  Upper Respiratory Culture, Routine     Status: None   Collection Time: 06/03/16 10:01 AM  Result Value Ref Range Status   Upper Respiratory Culture Final report  Final   Result 1 Comment  Final    Comment: Routine respiratory flora Heavy growth     Coagulation Studies: No results for input(s): LABPROT, INR in the last 72 hours.  Urinalysis:  Recent Labs Lab 06/10/17 1554  COLORURINE YELLOW*  LABSPEC 1.008  PHURINE 6.0   GLUCOSEU NEGATIVE  HGBUR NEGATIVE  BILIRUBINUR NEGATIVE  KETONESUR NEGATIVE  PROTEINUR NEGATIVE  NITRITE NEGATIVE  LEUKOCYTESUR NEGATIVE    Lipid Panel:     Component Value Date/Time   CHOL 80 06/11/2017 0608   TRIG 43 06/11/2017 0608   HDL 33 (L) 06/11/2017 0608   CHOLHDL 2.4 06/11/2017 0608   VLDL 9 06/11/2017 0608   LDLCALC 38 06/11/2017 0608    HgbA1C:  Lab Results  Component Value Date   HGBA1C 4.9 06/11/2017    Urine Drug Screen:     Component Value Date/Time   LABOPIA NONE DETECTED 06/10/2017 1554   COCAINSCRNUR NONE DETECTED 06/10/2017 1554   LABBENZ NONE DETECTED 06/10/2017 1554   AMPHETMU NONE DETECTED 06/10/2017 1554   THCU NONE DETECTED 06/10/2017 1554   LABBARB NONE DETECTED 06/10/2017 1554    Alcohol Level:  Recent Labs Lab 06/10/17 1554  ETH <10    Other results: EKG: sinus rhythm at 58 bpm.  Imaging: Ct Head Wo Contrast  Result Date: 06/10/2017 CLINICAL DATA:  Increased confusion EXAM: CT HEAD WITHOUT CONTRAST TECHNIQUE: Contiguous axial images were obtained from the base of the skull through the vertex without intravenous contrast. COMPARISON:  01/02/2017, 08/16/2016, MRI 09/27/2010 FINDINGS: Brain: No acute territorial infarction, hemorrhage or intracranial mass is visualized. Chronic encephalomalacia with calcification in the right frontal lobe. 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 Ex vacuo dilatation of the right lateral ventricle as before. Vascular: No hyperdense vessels. Scattered calcification at the carotid siphon Skull: No fracture or suspicious bone lesion Sinuses/Orbits: No acute finding. Other: None IMPRESSION: 1. No CT evidence for acute intracranial abnormality. 2. 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   On: 06/10/2017 15:30   US Carotid Bilateral  Result Date: 06/11/2017 CLINICAL DATA:  TIA. EXAM: BILATERAL CAROTID DUPLEX ULTRASOUND TECHNIQUE: Wallace Cullens scale  imaging, color Doppler and duplex ultrasound were performed of bilateral carotid and vertebral arteries in the neck. COMPARISON:  CT 06/10/2017 . FINDINGS: Criteria: Quantification of carotid stenosis is based on velocity parameters that correlate the residual internal carotid diameter with NASCET-based stenosis levels, using the diameter of the distal internal carotid lumen as the denominator for stenosis measurement. The following velocity measurements were obtained: RIGHT ICA:  77/31 cm/sec CCA:  97/17 cm/sec SYSTOLIC ICA/CCA RATIO:  0.8 DIASTOLIC ICA/CCA RATIO:  1.8 ECA:  91 cm/sec LEFT ICA:  68/16 cm/sec CCA:  100/17 cm/sec SYSTOLIC ICA/CCA RATIO:  0.7 DIASTOLIC ICA/CCA RATIO:  1.0 ECA:  93 cm/sec RIGHT CAROTID ARTERY: No significant carotid atherosclerotic vascular disease. No flow  limiting stenosis. RIGHT VERTEBRAL ARTERY:  Patent with antegrade flow. LEFT CAROTID ARTERY: No significant carotid atherosclerotic vascular disease. LEFT VERTEBRAL ARTERY:  Patent with antegrade flow . IMPRESSION: 1. No significant carotid atherosclerotic vascular disease. 2. Vertebrals are patent antegrade flow. Electronically Signed   By: Maisie Fus  Register   On: 06/11/2017 10:41     Assessment/Plan: 51 year old female presenting with an episode of difficulty getting her words out.  Describes extreme dry mouth.  Head CT reviewed and shows no acute changes.  Patient unable to have MRI due to stimulator.  Carotid ultrasound shows no hemodynamically significant stenosis.  Do not suspect episode is a TIA, but likely related to medications.   Recommendations: 1. Patient to discontinue Elavil 2. Patient to follow up with outpatient MD after discharge   Thana Farr, MD Neurology [phone] 06/11/2017, 9:10 PM

## 2017-06-11 NOTE — Progress Notes (Signed)
*  PRELIMINARY RESULTS* Echocardiogram 2D Echocardiogram has been performed.  [name] 06/11/2017, 9:23 AM

## 2017-06-11 NOTE — Progress Notes (Signed)
[facility] [place] at [facility] [name] was admitted to the Hospital on 06/10/2017 and Discharged  06/11/2017 and should be excused from work/school   for4 days starting 06/10/2017 , may return to work/school without any restrictions.  Call Auburn Bilberry MD with questions.  Auburn Bilberry M.D on 06/11/2017,at 3:42 PM  [facility] [place] at [facility]  [phone]

## 2017-06-11 NOTE — Discharge Instructions (Signed)
[facility] - [place] at [facility] ° °DIET:  °Cardiac diet ° °DISCHARGE CONDITION:  °Stable ° °ACTIVITY:  °Activity as tolerated ° °OXYGEN:  °Home Oxygen: No. °  °Oxygen Delivery: room air ° °DISCHARGE LOCATION:  °home  ° ° °ADDITIONAL DISCHARGE INSTRUCTION: ° ° °If you experience worsening of your admission symptoms, develop shortness of breath, life threatening emergency, suicidal or homicidal thoughts you must seek medical attention immediately by calling 911 or calling your MD immediately  if symptoms less severe. ° °You Must read complete instructions/literature along with all the possible adverse reactions/side effects for all the Medicines you take and that have been prescribed to you. Take any new Medicines after you have completely understood and accpet all the possible adverse reactions/side effects.  ° °Please note ° °You were cared for by a hospitalist during your hospital stay. If you have any questions about your discharge medications or the care you received while you were in the hospital after you are discharged, you can call the unit and asked to speak with the hospitalist on call if the hospitalist that took care of you is not available. Once you are discharged, your primary care physician will handle any further medical issues. Please note that NO REFILLS for any discharge medications will be authorized once you are discharged, as it is imperative that you return to your primary care physician (or establish a relationship with a primary care physician if you do not have one) for your aftercare needs so that they can reassess your need for medications and monitor your lab values. ° ° °

## 2017-06-11 NOTE — Evaluation (Signed)
Physical Therapy Evaluation Patient Details Name: [name] MRN: [id number] DOB: 1966-02-09 Today's Date: 06/11/2017   History of Present Illness  Pt admitted for TIA. Pt with complaints of AMS and expressive aphasia. Pt with history of anxiety/depression, chronic LBP, DM, and HTN. Speech currently at baseline level at this time.  Clinical Impression  Pt is a pleasant 51 year old female who was admitted for TIA. Pt demonstrates all bed mobility/transfers/ambulation at baseline level. Pt does not require any further PT needs at this time. Pt will be dc in house and does not require follow up. RN aware. Will dc current orders.      Follow Up Recommendations No PT follow up    Equipment Recommendations  None recommended by PT    Recommendations for Other Services       Precautions / Restrictions Precautions Precautions: Fall Restrictions Weight Bearing Restrictions: No      Mobility  Bed Mobility Overal bed mobility: Independent             General bed mobility comments: safe technique performed  Transfers Overall transfer level: Independent Equipment used: None             General transfer comment: safe technique performed without RW used  Ambulation/Gait Ambulation/Gait assistance: Min guard Ambulation Distance (Feet): 200 Feet Assistive device: None Gait Pattern/deviations: WFL(Within Functional Limits)     General Gait Details: ambulated using reciprocal gait pattern, no AD required. Safe technique performed. Able to carry conversation during ambulation.  Stairs            Wheelchair Mobility    Modified Rankin (Stroke Patients Only)       Balance Overall balance assessment: History of Falls                                           Pertinent Vitals/Pain Pain Assessment: No/denies pain    Home Living Family/patient expects to be discharged to:: Private residence Living Arrangements: Spouse/significant  other Available Help at Discharge: Family Type of Home: House Home Access: Level entry     Home Layout: Two level Home Equipment: Crutches (also has access to RW)      Prior Function Level of Independence: Independent         Comments: was currently working full time job, appears to be stressful and 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 her job     Higher education careers adviser        Extremity/Trunk Assessment   Upper Extremity Assessment Upper Extremity Assessment: Overall WFL for tasks assessed    Lower Extremity Assessment Lower Extremity Assessment: Overall WFL for tasks assessed       Communication   Communication: No difficulties  Cognition Arousal/Alertness: Awake/alert Behavior During Therapy: WFL for tasks assessed/performed Overall Cognitive Status: Impaired/Different from baseline (appears slightly confused with in depth questioning) Area of Impairment: Orientation;Memory                                      General Comments      Exercises     Assessment/Plan    PT Assessment Patent does not need any further PT services  PT Problem List Decreased balance       PT Treatment Interventions      PT Goals (Current goals can be found in the  Care Plan section)  Acute Rehab PT Goals Patient Stated Goal: to go home PT Goal Formulation: With patient Time For Goal Achievement: 06/11/17 Potential to Achieve Goals: Good    Frequency     Barriers to discharge        Co-evaluation               AM-PAC PT "6 Clicks" Daily Activity  Outcome Measure Difficulty turning over in bed (including adjusting bedclothes, sheets and blankets)?: None Difficulty moving from lying on back to sitting on the side of the bed? : None Difficulty sitting down on and standing up from a chair with arms (e.g., wheelchair, bedside commode, etc,.)?: None Help needed moving to and from a bed to chair (including a wheelchair)?: None Help needed walking in hospital room?: None Help  needed climbing 3-5 steps with a railing? : A Little 6 Click Score: 23    End of Session Equipment Utilized During Treatment: Gait belt Activity Tolerance: Patient tolerated treatment well Patient left: in bed;with bed alarm set Nurse Communication: Mobility status PT Visit Diagnosis: Unsteadiness on feet (R26.81);Muscle weakness (generalized) (M62.81)    Time: 1610-9604 PT Time Calculation (min) (ACUTE ONLY): 20 min   Charges:   PT Evaluation $PT Eval Low Complexity: 1 Low     PT G Codes:   PT G-Codes **NOT FOR INPATIENT CLASS** Functional Assessment Tool Used: AM-PAC 6 Clicks Basic Mobility Functional Limitation: Mobility: Walking and moving around Mobility: Walking and Moving Around Current Status (V4098): At least 1 percent but less than 20 percent impaired, limited or restricted Mobility: Walking and Moving Around Goal Status [id number]): 0 percent impaired, limited or restricted   Elizabeth Palau, PT, DPT [phone]   [name] 06/11/2017, 1:34 PM

## 2017-06-11 NOTE — Progress Notes (Signed)
Medications administered by student RN [id number] with supervision of Clinical Instructor Katalena Malveaux MSN, RN-BC or patient's assigned RN.   

## 2017-06-11 NOTE — Discharge Summary (Signed)
[facility] [place] [facility], 51 y.o., DOB [birth year], MRN [id number]. Admission date: 06/10/2017 Discharge Date 06/11/2017 Primary MD Allegra Grana, FNP Admitting Physician Enid Baas, MD  Admission Diagnosis  TIA (transient ischemic attack) [G45.9] Transient cerebral ischemia, unspecified type [G45.9]  Discharge Diagnosis   Active Problems:   Slurred speech and some gait trouble this is felt to be due to polypharmacy not TIA   Chronic back pain   Depression   Hypothyroidism    Hospital Course [name]  is a 51 y.o. female with a known history of anxiety and depression, chronic low back pain on multiple pain medications, diet controlled diabetes and hypertension since her gastric bypass surgery, left-sided sciatica presents to hospital secondary to worsening expressive aphasia.patient was evaluated in the emergency room had a CT scan of the head was negative. Had carotid Dopplers which were nonrevealing. She was seen by neurology who felt that her symptoms were due to her medications. Her primary care provider who manages these medications needs to wean her off slowly. At this time we will not be changing any of her medications.             Consults  neurology  Significant Tests:  See full reports for all details     Ct Head Wo Contrast  Result Date: 06/10/2017 CLINICAL DATA:  Increased confusion EXAM: CT HEAD WITHOUT CONTRAST TECHNIQUE: Contiguous axial images were obtained from the base of the skull through the vertex without intravenous contrast. COMPARISON:  01/02/2017, 08/16/2016, MRI 09/27/2010 FINDINGS: Brain: No acute territorial infarction, hemorrhage or intracranial mass is visualized. Chronic encephalomalacia with calcification in the right frontal lobe. 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 Ex vacuo dilatation of the right lateral ventricle as before. Vascular: No hyperdense vessels. Scattered  calcification at the carotid siphon Skull: No fracture or suspicious bone lesion Sinuses/Orbits: No acute finding. Other: None IMPRESSION: 1. No CT evidence for acute intracranial abnormality. 2. 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   On: 06/10/2017 15:30   US Carotid Bilateral  Result Date: 06/11/2017 CLINICAL DATA:  TIA. EXAM: BILATERAL CAROTID DUPLEX ULTRASOUND TECHNIQUE: Wallace Cullens scale imaging, color Doppler and duplex ultrasound were performed of bilateral carotid and vertebral arteries in the neck. COMPARISON:  CT 06/10/2017 . FINDINGS: Criteria: Quantification of carotid stenosis is based on velocity parameters that correlate the residual internal carotid diameter with NASCET-based stenosis levels, using the diameter of the distal internal carotid lumen as the denominator for stenosis measurement. The following velocity measurements were obtained: RIGHT ICA:  77/31 cm/sec CCA:  97/17 cm/sec SYSTOLIC ICA/CCA RATIO:  0.8 DIASTOLIC ICA/CCA RATIO:  1.8 ECA:  91 cm/sec LEFT ICA:  68/16 cm/sec CCA:  100/17 cm/sec SYSTOLIC ICA/CCA RATIO:  0.7 DIASTOLIC ICA/CCA RATIO:  1.0 ECA:  93 cm/sec RIGHT CAROTID ARTERY: No significant carotid atherosclerotic vascular disease. No flow limiting stenosis. RIGHT VERTEBRAL ARTERY:  Patent with antegrade flow. LEFT CAROTID ARTERY: No significant carotid atherosclerotic vascular disease. LEFT VERTEBRAL ARTERY:  Patent with antegrade flow . IMPRESSION: 1. No significant carotid atherosclerotic vascular disease. 2. Vertebrals are patent antegrade flow. Electronically Signed   By: Maisie Fus  Register   On: 06/11/2017 10:41       Today   Subjective:   [name]  Complains of some slurred speech  Objective:   Blood pressure 109/64, pulse 80, temperature 98.4 F (36.9 C), temperature source Oral, resp. rate 14,  height  (1.651 m), weight 182 lb 1.6 oz (82.6 kg), last menstrual period 12/16/2015, SpO2 99 %.   .  Intake/Output Summary (Last 24 hours) at 06/11/17 1522 Last data filed at 06/11/17 1209  Gross per 24 hour  Intake              237 ml  Output                0 ml  Net              237 ml    Exam VITAL SIGNS: Blood pressure 109/64, pulse 80, temperature 98.4 F (36.9 C), temperature source Oral, resp. rate 14, height  (1.651 m), weight 182 lb 1.6 oz (82.6 kg), last menstrual period 12/16/2015, SpO2 99 %.  GENERAL:  51 y.o.-year-old patient lying in the bed with no acute distress.  EYES: Pupils equal, round, reactive to light and accommodation. No scleral icterus. Extraocular muscles intact.  HEENT: Head atraumatic, normocephalic. Oropharynx and nasopharynx clear.  NECK:  Supple, no jugular venous distention. No thyroid enlargement, no tenderness.  LUNGS: Normal breath sounds bilaterally, no wheezing, rales,rhonchi or crepitation. No use of accessory muscles of respiration.  CARDIOVASCULAR: S1, S2 normal. No murmurs, rubs, or gallops.  ABDOMEN: Soft, nontender, nondistended. Bowel sounds present. No organomegaly or mass.  EXTREMITIES: No pedal edema, cyanosis, or clubbing.  NEUROLOGIC: Cranial nerves II through XII are intact. Muscle strength 5/5 in all extremities. Sensation intact. Gait not checked.  PSYCHIATRIC: The patient is alert and oriented x 3.  SKIN: No obvious rash, lesion, or ulcer.   Data Review     CBC w Diff: Lab Results  Component Value Date   WBC 5.1 06/11/2017   HGB 12.1 06/11/2017   HGB 11.5 (L) 03/04/2012   HCT 36.4 06/11/2017   HCT 34.3 (L) 03/04/2012   PLT 164 06/11/2017   PLT 179 03/04/2012   LYMPHOPCT 18 04/21/2017   LYMPHOPCT 18.5 03/04/2012   MONOPCT 6 04/21/2017   MONOPCT 6.6 03/04/2012   EOSPCT 2 04/21/2017   EOSPCT 0.8 03/04/2012   BASOPCT 1 04/21/2017   BASOPCT 0.5 03/04/2012   CMP: Lab Results  Component Value Date   NA 142 06/11/2017   NA 142 05/09/2014   NA 141 03/04/2012   K 3.7 06/11/2017   K 4.0 03/04/2012   CL 109  06/11/2017   CL 107 03/04/2012   CO2 26 06/11/2017   CO2 25 03/04/2012   BUN 12 06/11/2017   BUN 23 (A) 05/09/2014   BUN 7 03/04/2012   CREATININE 0.80 06/11/2017   CREATININE 0.68 03/04/2012   GLU 76 05/09/2014   PROT 7.7 06/10/2017   ALBUMIN 3.9 06/10/2017   ALBUMIN 3.1 (L) 03/04/2012   BILITOT 0.4 06/10/2017   ALKPHOS 94 06/10/2017   AST 32 06/10/2017   ALT 29 06/10/2017  .  Micro Results No results found for this or any previous visit (from the past 240 hour(s)).      Code Status Orders        Start     Ordered   06/10/17 2127  Full code  Continuous     06/10/17 2127    Code Status History    Date Active Date Inactive Code Status Order ID Comments User Context   This patient has a current code status but no historical code status.          Follow-up Information    Allegra Grana, FNP Follow up in  1 week(s).   Specialty:  Family Medicine Contact information: [address] Laurell Josephs [address] [place] [address] [phone]           Discharge Medications   Allergies as of 06/11/2017      Reactions   Glipizide Nausea And Vomiting   REACTION: nausea   Hydrocodone-acetaminophen Itching   Lyrica [pregabalin]    Weight gain   Metformin Diarrhea   REACTION: GI   Penicillins Itching, Swelling   Has patient had a PCN reaction causing immediate rash, facial/tongue/throat swelling, SOB or lightheadedness with hypotension: yes Has patient had a PCN reaction causing severe rash involving mucus membranes or skin necrosis: no Has patient had a PCN reaction that required hospitalization : yes ed visit Has patient had a PCN reaction occurring within the last 10 years: yes If all of the above answers are "NO", then may proceed with Cephalosporin use.   Adhesive [tape] Rash   Red, and itching      Medication List    TAKE these medications   aspirin EC 81 MG tablet Take 1 tablet (81 mg total) by mouth daily.   baclofen 10 MG tablet Commonly known as:   LIORESAL Take 10 mg by mouth daily.   buPROPion 200 MG 12 hr tablet Commonly known as:  WELLBUTRIN SR Take 400 mg by mouth daily.   DULoxetine 60 MG capsule Commonly known as:  CYMBALTA Take 60 mg by mouth 2 (two) times daily.   ferrous sulfate 160 (50 Fe) MG Tbcr SR tablet Commonly known as:  EQL SLOW RELEASE IRON Take 1 tablet (160 mg total) by mouth every other day.   gabapentin 400 MG capsule Commonly known as:  NEURONTIN Take 800 mg by mouth 2 (two) times daily.   levothyroxine 100 MCG tablet Commonly known as:  SYNTHROID, LEVOTHROID TAKE 1 TABLET BY MOUTH EVERY DAY   Melatonin 2.5 MG Chew Chew 1 Dose by mouth at bedtime.   sucralfate 1 GM/10ML suspension Commonly known as:  CARAFATE Take 10 mLs (1 g total) by mouth 4 (four) times daily.   tapentadol 50 MG tablet Commonly known as:  NUCYNTA Take 100 mg by mouth 2 (two) times daily.   valACYclovir 1000 MG tablet Commonly known as:  VALTREX Take 1 tablet (1,000 mg total) by mouth 3 (three) times daily.            Discharge Care Instructions        Start     Ordered   06/11/17 0000  aspirin EC 81 MG tablet  Daily     06/11/17 1402         Total Time in preparing paper work, data evaluation and todays exam - 35 minutes  Auburn Bilberry M.D on 06/11/2017 at 3:22 PM  [facility]   Office  [phone]

## 2017-06-11 NOTE — Telephone Encounter (Signed)
Patient discharged from [facility] today with an office follow up visit on 10.4.18 @ 10.

## 2017-06-11 NOTE — Progress Notes (Signed)
OT Cancellation Note  Patient Details Name: [name] MRN: [id number] DOB: 1965/10/01   Cancelled Treatment:    Reason Eval/Treat Not Completed: OT screened, no needs identified, will sign off. Order received, chart reviewed. Pt with no sensory or strength deficits, pt feeling "back to normal" with mild speech deficits that "come and go". Otherwise, pt at baseline for functional tasks with no skilled OT needs at this time. Will sign off.  Richrd Prime, MPH, MS, OTR/L ascom [phone] 06/11/17, 1:37 PM

## 2017-06-12 LAB — HIV ANTIBODY (ROUTINE TESTING W REFLEX): HIV SCREEN 4TH GENERATION: NONREACTIVE

## 2017-06-12 NOTE — Telephone Encounter (Signed)
Transition Care Management Follow-up Telephone Call  How have you been since you were released from the hospital? Patient stated that slurred speech is better, but still not completely gone.  Patient staed otherwise she feels fine.   Do you understand why you were in the hospital? Yes   Do you understand the discharge instrcutions? yes  Items Reviewed:  Medications reviewed: yes  Allergies reviewed: yes  Dietary changes reviewed: Yes  Referrals reviewed: yes. Patient is following up with Pain Management also to see what combination of medications could cause slurred speech.   Functional Questionnaire:   Activities of Daily Living (ADLs):   She states they are independent in the following: Independent in ADLS States they require assistance with the following: No assist needed.   Any transportation issues/concerns?: NO   Any patient concerns? No   Confirmed importance and date/time of follow-up visits scheduled: Yes   Confirmed with patient if condition begins to worsen call PCP or go to the ER.  Patient was given the Call-a-Nurse line [phone]: Yes

## 2017-06-15 NOTE — Progress Notes (Deleted)
Subjective:    Patient ID: [name], female    DOB: Sep 23, 1965, 51 y.o.   MRN: [id number]  CC: [name] is a 51 y.o. female who presents today for follow up.   HPI: Patient admitted on for 9/26 and discharged the following day . TCM call 06/12/17.   admission diagnosis of TIA and discharge diagnoses of slurred speech which was felt to be due to polypharmacy and not a TIA. During hospitalization, CT of the head which was negative. Carotid Dopplers showed no significant atherosclerosis . She was seen by neurology for her symptoms were due to polypharmacy.   Dr Dawna Part- pain management- nucynta, gabapentin, cymbalta, wellbutrin, baclofen  Anemia- Dr Servando Snare- 04/2017 EGD showed normal esophagus one nonbleeding ulcer. Colonoscopy was normal, repeat in 5 years.  HISTORY:  Past Medical History:  Diagnosis Date  . Anemia   . Anxiety and depression   . Chronic back pain   . DM2 (diabetes mellitus, type 2) (HCC)    improved since gastric bypass  . Headache    stress/sinus - 2-3x/wk  . HLD (hyperlipidemia)   . HTN (hypertension)    improved since gastric bypass  . Hypothyroid   . Left leg weakness    s/p back surgery  . Obesity   . S/P insertion of spinal cord stimulator   . Wears contact lenses    Past Surgical History:  Procedure Laterality Date  . Abd [place] - gallstones  5/03  . BACK SURGERY  2009   decompressive laminectomy  . BARIATRIC SURGERY  03/02/2012   gastric bypass; Dr Smitty Cords  . CHOLECYSTECTOMY  6/03  . COLONOSCOPY WITH PROPOFOL N/A 05/01/2017   Procedure: COLONOSCOPY WITH PROPOFOL;  Surgeon: Midge Minium, MD;  Location: [facility];  Service: Gastroenterology;  Laterality: N/A;  . DILATION AND CURETTAGE OF UTERUS  1/05  . ESOPHAGOGASTRODUODENOSCOPY (EGD) WITH PROPOFOL N/A 05/01/2017   Procedure: ESOPHAGOGASTRODUODENOSCOPY (EGD) WITH PROPOFOL;  Surgeon: Midge Minium, MD;  Location: [facility];  Service: Gastroenterology;  Laterality: N/A;    . KNEE SURGERY     L - arthroscopic  . LIVER BIOPSY  6/03   fatty liver  . LUMBAR SPINE SURGERY  2015   Dr. Amalia Greenhouse  . NASAL SINUS SURGERY    . PANNICULECTOMY  08/2013   [facility], Dr. Diamond Nickel  . sleep study - apnea  04/2001   improved since gastric bypass   Family History  Problem Relation Age of Onset  . Lung cancer Father   . Diabetes Mother   . Diabetes Sister   . Fibromyalgia Sister   . Colon cancer Maternal Aunt   . Skin cancer Paternal Aunt   . Skin cancer Paternal Uncle     Allergies: Glipizide; Hydrocodone-acetaminophen; Lyrica [pregabalin]; Metformin; Penicillins; and Adhesive [tape] Current Outpatient Prescriptions on File Prior to Visit  Medication Sig Dispense Refill  . aspirin EC 81 MG tablet Take 1 tablet (81 mg total) by mouth daily. 30 tablet 11  . baclofen (LIORESAL) 10 MG tablet Take 10 mg by mouth daily.    Marland Kitchen buPROPion (WELLBUTRIN SR) 200 MG 12 hr tablet Take 400 mg by mouth daily.     . DULoxetine (CYMBALTA) 60 MG capsule Take 60 mg by mouth 2 (two) times daily.    . ferrous sulfate (EQL SLOW RELEASE IRON) 160 (50 Fe) MG TBCR SR tablet Take 1 tablet (160 mg total) by mouth every other day. 30 each 6  . gabapentin (NEURONTIN) 400 MG capsule Take  800 mg by mouth 2 (two) times daily.     Marland Kitchen levothyroxine (SYNTHROID, LEVOTHROID) 100 MCG tablet TAKE 1 TABLET BY MOUTH EVERY DAY 30 tablet 2  . Melatonin 2.5 MG CHEW Chew 1 Dose by mouth at bedtime.    . sucralfate (CARAFATE) 1 GM/10ML suspension Take 10 mLs (1 g total) by mouth 4 (four) times daily. (Patient not taking: Reported on 06/10/2017) 420 mL 3  . tapentadol (NUCYNTA) 50 MG tablet Take 100 mg by mouth 2 (two) times daily.    . valACYclovir (VALTREX) 1000 MG tablet Take 1 tablet (1,000 mg total) by mouth 3 (three) times daily. (Patient not taking: Reported on 06/10/2017) 21 tablet 0   No current facility-administered medications on file prior to visit.     Social History  Substance Use Topics  . Smoking status:  Never Smoker  . Smokeless tobacco: Never Used     Comment: tobacco use - no  . Alcohol use No    Review of Systems    Objective:    LMP 12/16/2015  BP Readings from Last 3 Encounters:  06/11/17 109/64  05/30/17 128/72  05/01/17 113/65   Wt Readings from Last 3 Encounters:  06/10/17 182 lb 1.6 oz (82.6 kg)  05/30/17 180 lb (81.6 kg)  05/01/17 188 lb (85.3 kg)    Physical Exam     Assessment & Plan:   Problem List Items Addressed This Visit    None       I am having [name] maintain her baclofen, buPROPion, DULoxetine, gabapentin, ferrous sulfate, Melatonin, tapentadol, sucralfate, levothyroxine, valACYclovir, and aspirin EC.   No orders of the defined types were placed in this encounter.   Return precautions given.   Risks, benefits, and alternatives of the medications and treatment plan prescribed today were discussed, and patient expressed understanding.   Education regarding symptom management and diagnosis given to patient on AVS.  Continue to follow with Allegra Grana, FNP for routine health maintenance.   [name] and I agreed with plan.   Rennie Plowman, FNP

## 2017-06-18 ENCOUNTER — Ambulatory Visit: Payer: BLUE CROSS/BLUE SHIELD | Admitting: Family

## 2017-06-20 ENCOUNTER — Emergency Department: Payer: BLUE CROSS/BLUE SHIELD

## 2017-06-20 ENCOUNTER — Emergency Department
Admission: EM | Admit: 2017-06-20 | Discharge: 2017-06-20 | Disposition: A | Discharge status: Home / Self Care | Payer: BLUE CROSS/BLUE SHIELD | Attending: Student in an Organized Health Care Education/Training Program | Admitting: Student in an Organized Health Care Education/Training Program

## 2017-06-20 DIAGNOSIS — N390 Urinary tract infection, site not specified: Secondary | ICD-10-CM

## 2017-06-20 DIAGNOSIS — E039 Hypothyroidism, unspecified: Secondary | ICD-10-CM | POA: Insufficient documentation

## 2017-06-20 DIAGNOSIS — W19XXXA Unspecified fall, initial encounter: Secondary | ICD-10-CM

## 2017-06-20 DIAGNOSIS — Y939 Activity, unspecified: Secondary | ICD-10-CM | POA: Diagnosis not present

## 2017-06-20 DIAGNOSIS — G8929 Other chronic pain: Secondary | ICD-10-CM

## 2017-06-20 DIAGNOSIS — W2209XA Striking against other stationary object, initial encounter: Secondary | ICD-10-CM | POA: Diagnosis not present

## 2017-06-20 DIAGNOSIS — I10 Essential (primary) hypertension: Secondary | ICD-10-CM | POA: Insufficient documentation

## 2017-06-20 DIAGNOSIS — Y999 Unspecified external cause status: Secondary | ICD-10-CM | POA: Insufficient documentation

## 2017-06-20 DIAGNOSIS — E119 Type 2 diabetes mellitus without complications: Secondary | ICD-10-CM | POA: Diagnosis not present

## 2017-06-20 DIAGNOSIS — Z79899 Other long term (current) drug therapy: Secondary | ICD-10-CM | POA: Diagnosis not present

## 2017-06-20 DIAGNOSIS — Y92003 Bedroom of unspecified non-institutional (private) residence as the place of occurrence of the external cause: Secondary | ICD-10-CM | POA: Insufficient documentation

## 2017-06-20 DIAGNOSIS — S3991XA Unspecified injury of abdomen, initial encounter: Secondary | ICD-10-CM | POA: Diagnosis not present

## 2017-06-20 DIAGNOSIS — R109 Unspecified abdominal pain: Secondary | ICD-10-CM | POA: Diagnosis not present

## 2017-06-20 DIAGNOSIS — M545 Low back pain: Secondary | ICD-10-CM | POA: Diagnosis not present

## 2017-06-20 LAB — URINALYSIS, COMPLETE (UACMP) WITH MICROSCOPIC
BACTERIA UA: NONE SEEN
Bilirubin Urine: NEGATIVE
Glucose, UA: NEGATIVE mg/dL
HGB URINE DIPSTICK: NEGATIVE
Ketones, ur: 5 mg/dL — AB
Nitrite: NEGATIVE
PROTEIN: 30 mg/dL — AB
Specific Gravity, Urine: 1.028 (ref 1.005–1.030)
pH: 5 (ref 5.0–8.0)

## 2017-06-20 MED ORDER — KETOROLAC TROMETHAMINE 10 MG PO TABS: 10.0000 mg | ORAL_TABLET | Freq: Four times a day (QID) | ORAL | 0 refills | Status: DC | PRN

## 2017-06-20 MED ORDER — KETOROLAC TROMETHAMINE 30 MG/ML IJ SOLN
30.0000 mg | Freq: Once | INTRAMUSCULAR | Status: AC
  Administered 2017-06-20: 30 mg via INTRAMUSCULAR
  Filled 2017-06-20: qty 1

## 2017-06-20 MED ORDER — METAXALONE 800 MG PO TABS: 800.0000 mg | ORAL_TABLET | Freq: Three times a day (TID) | ORAL | 0 refills | Status: DC

## 2017-06-20 MED ORDER — LIDOCAINE 5 % EX PTCH: 1.0000 | MEDICATED_PATCH | Freq: Two times a day (BID) | CUTANEOUS | 0 refills | Status: DC

## 2017-06-20 MED ORDER — ORPHENADRINE CITRATE 30 MG/ML IJ SOLN
60.0000 mg | Freq: Two times a day (BID) | INTRAMUSCULAR | Status: DC
  Administered 2017-06-20: 60 mg via INTRAMUSCULAR
  Filled 2017-06-20: qty 2

## 2017-06-20 MED ORDER — CIPROFLOXACIN HCL 500 MG PO TABS: 500.0000 mg | ORAL_TABLET | Freq: Two times a day (BID) | ORAL | 0 refills | Status: DC

## 2017-06-20 NOTE — ED Triage Notes (Signed)
Pt says 9 days ago she fell at home; c/o mid back pain since that pt says has not gotten any better; rates pain 10/10; ambulatory with steady gait; pt currently sitting with legs crossed in triage with no difficulty noted;

## 2017-06-20 NOTE — ED Provider Notes (Signed)
[facility] Emergency Department Provider Note  ____________________________________________  Time seen: Approximately 3:27 PM  I have reviewed the triage vital signs and the nursing notes.   HISTORY  Chief Complaint Back Pain    HPI [name] is a 51 y.o. female that presents for evaluation of low back pain after hitting her back on a dresser 1 week ago. She thinks she hit in the middle of her low back. Pain is primarily on the left side. It does not radiate. It is worse with movement. Patient has chronic back pain and has had surgery. She has a spinal cord stimulator in her right back. No fever, shortness breath, chest pain, nausea, vomiting, abdominal pain, bowel or bladder dysfunction, saddle paresthesias, dysuria, urgency, frequency.   Past Medical History:  Diagnosis Date  . Anemia   . Anxiety and depression   . Chronic back pain   . DM2 (diabetes mellitus, type 2) (HCC)    improved since gastric bypass  . Headache    stress/sinus - 2-3x/wk  . HLD (hyperlipidemia)   . HTN (hypertension)    improved since gastric bypass  . Hypothyroid   . Left leg weakness    s/p back surgery  . Obesity   . S/P insertion of spinal cord stimulator   . Wears contact lenses     Patient Active Problem List   Diagnosis Date Noted  . TIA (transient ischemic attack) 06/10/2017  . Bariatric surgery status   . Gastrointestinal ulcer   . Iron deficiency anemia 04/21/2017  . Nonintractable headache 02/23/2017  . Narcotic overdose (HCC) 01/12/2017  . Tremor 10/29/2016  . Benign paroxysmal positional vertigo 08/21/2016  . Dizziness 05/15/2015  . Right-sided low back pain without sciatica 10/27/2014  . Hyperpigmentation 05/05/2014  . Viral wart on finger 05/05/2014  . Gluteal pain 12/28/2013  . Elevated LFTs 12/13/2013  . Depression with anxiety 10/26/2012  . Anemia 05/05/2012  . Hypothyroidism 10/28/2007  . HYPERCHOLESTEROLEMIA, PURE 05/21/2007     Past Surgical History:  Procedure Laterality Date  . Abd [place] - gallstones  5/03  . BACK SURGERY  2009   decompressive laminectomy  . BARIATRIC SURGERY  03/02/2012   gastric bypass; Dr Smitty Cords  . CHOLECYSTECTOMY  6/03  . COLONOSCOPY WITH PROPOFOL N/A 05/01/2017   Procedure: COLONOSCOPY WITH PROPOFOL;  Surgeon: Midge Minium, MD;  Location: [facility];  Service: Gastroenterology;  Laterality: N/A;  . DILATION AND CURETTAGE OF UTERUS  1/05  . ESOPHAGOGASTRODUODENOSCOPY (EGD) WITH PROPOFOL N/A 05/01/2017   Procedure: ESOPHAGOGASTRODUODENOSCOPY (EGD) WITH PROPOFOL;  Surgeon: Midge Minium, MD;  Location: [facility];  Service: Gastroenterology;  Laterality: N/A;  . KNEE SURGERY     L - arthroscopic  . LIVER BIOPSY  6/03   fatty liver  . LUMBAR SPINE SURGERY  2015   Dr. Amalia Greenhouse  . NASAL SINUS SURGERY    . PANNICULECTOMY  08/2013   [facility], Dr. Diamond Nickel  . sleep study - apnea  04/2001   improved since gastric bypass    Prior to Admission medications   Medication Sig Start Date End Date Taking? Authorizing Provider  aspirin EC 81 MG tablet Take 1 tablet (81 mg total) by mouth daily. 06/11/17 06/11/18  Auburn Bilberry, MD  baclofen (LIORESAL) 10 MG tablet Take 10 mg by mouth daily. 07/28/16   [provider]  buPROPion (WELLBUTRIN SR) 200 MG 12 hr tablet Take 400 mg by mouth daily.  08/04/16   [provider]  ciprofloxacin (CIPRO)  500 MG tablet Take 1 tablet (500 mg total) by mouth 2 (two) times daily. 06/20/17   Enid Derry, PA-C  DULoxetine (CYMBALTA) 60 MG capsule Take 60 mg by mouth 2 (two) times daily. 07/28/16   [provider]  ferrous sulfate (EQL SLOW RELEASE IRON) 160 (50 Fe) MG TBCR SR tablet Take 1 tablet (160 mg total) by mouth every other day. 02/12/17   Emily Filbert, MD  gabapentin (NEURONTIN) 400 MG capsule Take 800 mg by mouth 2 (two) times daily.  07/31/16   [provider]  ketorolac (TORADOL) 10 MG tablet Take 1  tablet (10 mg total) by mouth every 6 (six) hours as needed. 06/20/17   Enid Derry, PA-C  levothyroxine (SYNTHROID, LEVOTHROID) 100 MCG tablet TAKE 1 TABLET BY MOUTH EVERY DAY 05/04/17   Allegra Grana, FNP  lidocaine (LIDODERM) 5 % Place 1 patch onto the skin every 12 (twelve) hours. Remove & Discard patch within 12 hours or as directed by MD 06/20/17 06/20/18  Enid Derry, PA-C  Melatonin 2.5 MG CHEW Chew 1 Dose by mouth at bedtime.    [provider]  metaxalone (SKELAXIN) 800 MG tablet Take 1 tablet (800 mg total) by mouth 3 (three) times daily. 06/20/17 06/20/18  Enid Derry, PA-C  sucralfate (CARAFATE) 1 GM/10ML suspension Take 10 mLs (1 g total) by mouth 4 (four) times daily. Patient not taking: Reported on 06/10/2017 05/01/17 05/01/18  Midge Minium, MD  tapentadol (NUCYNTA) 50 MG tablet Take 100 mg by mouth 2 (two) times daily.    [provider]  valACYclovir (VALTREX) 1000 MG tablet Take 1 tablet (1,000 mg total) by mouth 3 (three) times daily. Patient not taking: Reported on 06/10/2017 05/30/17   Payton Mccallum, MD    Allergies Glipizide; Hydrocodone-acetaminophen; Lyrica [pregabalin]; Metformin; Penicillins; and Adhesive [tape]  Family History  Problem Relation Age of Onset  . Lung cancer Father   . Diabetes Mother   . Diabetes Sister   . Fibromyalgia Sister   . Colon cancer Maternal Aunt   . Skin cancer Paternal Aunt   . Skin cancer Paternal Uncle     Social History Social History  Substance Use Topics  . Smoking status: Never Smoker  . Smokeless tobacco: Never Used     Comment: tobacco use - no  . Alcohol use No     Review of Systems  Constitutional: No fever/chills Cardiovascular: No chest pain. Respiratory: No SOB. Gastrointestinal: No abdominal pain.  No nausea, no vomiting.  Musculoskeletal: Positive for back pain. Skin: Negative for rash, abrasions, lacerations, ecchymosis. Neurological: Negative for headaches, numbness or  tingling   ____________________________________________   PHYSICAL EXAM:  VITAL SIGNS: ED Triage Vitals  Enc Vitals Group     BP 06/20/17 0648 134/72     Pulse Rate 06/20/17 0648 89     Resp 06/20/17 0648 18     Temp 06/20/17 0648 98.1 F (36.7 C)     Temp Source 06/20/17 0648 Oral     SpO2 06/20/17 0648 99 %     Weight 06/20/17 0648 182 lb (82.6 kg)     Height 06/20/17 0648  (1.651 m)     Head Circumference --      Peak Flow --      Pain Score 06/20/17 0647 10     Pain Loc --      Pain Edu? --      Excl. in GC? --      Constitutional: Alert and oriented.  Well appearing and in no acute distress. Eyes: Conjunctivae are normal. PERRL. EOMI. Head: Atraumatic. ENT:      Ears:      Nose: No congestion/rhinnorhea.      Mouth/Throat: Mucous membranes are moist.  Neck: No stridor.   Cardiovascular: Normal rate, regular rhythm.  Good peripheral circulation. Respiratory: Normal respiratory effort without tachypnea or retractions. Lungs CTAB. Good air entry to the bases with no decreased or absent breath sounds. Gastrointestinal: Bowel sounds 4 quadrants. Soft and nontender to palpation. No guarding or rigidity. No palpable masses. No distention.  Musculoskeletal: Full range of motion to all extremities. No gross deformities appreciated. Tenderness to palpation over left lumbar spine and over left lumbar and thoracic paraspinal muscles. Spinal stimulator in place. Strength 5 out of 5 in lower extremities bilaterally. Neurologic:  Normal speech and language. No gross focal neurologic deficits are appreciated.  Skin:  Skin is warm, dry and intact. No rash noted. Psychiatric: Mood and affect are normal. Speech and behavior are normal. Patient exhibits appropriate insight and judgement.   ____________________________________________   LABS (all labs ordered are listed, but only abnormal results are displayed)  Labs Reviewed  URINALYSIS, COMPLETE (UACMP) WITH MICROSCOPIC -  Abnormal; Notable for the following:       Result Value   Color, Urine AMBER (*)    APPearance HAZY (*)    Ketones, ur 5 (*)    Protein, ur 30 (*)    Leukocytes, UA LARGE (*)    Squamous Epithelial / LPF 6-30 (*)    All other components within normal limits  URINE CULTURE   ____________________________________________  EKG   ____________________________________________  RADIOLOGY Lexine Baton, personally viewed and evaluated these images (plain radiographs) as part of my medical decision making, as well as reviewing the written report by the radiologist.  DG lumbar IMPRESSION:  No acute or traumatic finding. Previous decompression, diskectomy  and fusion at L4-5. Chronic lumbar spinal curvature. Mild lumbar  disc space narrowing elsewhere similar to the previous study.    CT renal stone  IMPRESSION:  1. Few small bowel air-fluid levels in the lower mid abdomen may  reflect mild enteritis secondary to an infectious or inflammatory  etiology. No bowel wall thickening.  2. No evidence of bowel obstruction.    ____________________________________________    PROCEDURES  Procedure(s) performed:    Procedures    Medications  ketorolac (TORADOL) 30 MG/ML injection 30 mg (30 mg Intramuscular Given 06/20/17 1050)     ____________________________________________   INITIAL IMPRESSION / ASSESSMENT AND PLAN / ED COURSE  Pertinent labs & imaging results that were available during my care of the patient were reviewed by me and considered in my medical decision making (see chart for details).  Review of the [place] CSRS was performed in accordance of the [facility] prior to dispensing any controlled drugs.  Patient presented to emergency department for evaluation of back pain. Vital signs and exam are reassuring. No acute bony abnormalities on lumbar x-ray.  Mechanism of hitting the center of her low back on a dresser and overall location of pain did not seem to line up so  urinalysis was ordered. CT was completed to evaluate and leukocytes and RBC on urinalysis. No kidney stone. CT indicates possible mild enteritis but patient is not having any GI symptoms or abdominal pain. Patient is allergic to Bactrim and penicillins. Patient will be discharged home with prescriptions for Cipro, Toradol, Skelaxin.  Patient has been on multiple narcotics in the past.  She sees a pain clinic for chronic back pain but is looking for a new one. Patient is to follow up with PCP as directed. Patient is given ED precautions to return to the ED for any worsening or new symptoms.     ____________________________________________  FINAL CLINICAL IMPRESSION(S) / ED DIAGNOSES  Final diagnoses:  Chronic bilateral low back pain without sciatica  Fall, initial encounter  Urinary tract infection without hematuria, site unspecified      NEW MEDICATIONS STARTED DURING THIS VISIT:  Discharge Medication List as of 06/20/2017 11:33 AM    START taking these medications   Details  ciprofloxacin (CIPRO) 500 MG tablet Take 1 tablet (500 mg total) by mouth 2 (two) times daily., Starting Sat 06/20/2017, Print    ketorolac (TORADOL) 10 MG tablet Take 1 tablet (10 mg total) by mouth every 6 (six) hours as needed., Starting Sat 06/20/2017, Print    lidocaine (LIDODERM) 5 % Place 1 patch onto the skin every 12 (twelve) hours. Remove & Discard patch within 12 hours or as directed by MD, Starting Sat 06/20/2017, Until Sun 06/20/2018, Print    metaxalone (SKELAXIN) 800 MG tablet Take 1 tablet (800 mg total) by mouth 3 (three) times daily., Starting Sat 06/20/2017, Until Sun 06/20/2018, Print            This chart was dictated using voice recognition software/Dragon. Despite best efforts to proofread, errors can occur which can change the meaning. Any change was purely unintentional.    Enid Derry, PA-C 06/21/17 1214    Enid Derry, PA-C 06/21/17 1215    Willy Eddy, MD 06/21/17  1919

## 2017-06-21 LAB — URINE CULTURE

## 2017-06-22 ENCOUNTER — Other Ambulatory Visit: Payer: Self-pay | Admitting: *Deleted

## 2017-06-22 DIAGNOSIS — D508 Other iron deficiency anemias: Secondary | ICD-10-CM

## 2017-06-22 NOTE — Progress Notes (Signed)
Hematology/Oncology Consult note [facility]  Telephone:([phone] [phone]  Patient Care Team: Allegra Grana, FNP as PCP - General (Family Medicine)   Name of the patient: [name]  [id number]  1965-12-06   Date of visit: 06/22/17  Diagnosis- iron deficiency anemia likely from gastric bypass  Chief complaint/ Reason for visit- routine f/u of anemia  Heme/Onc history: patient is a 51 year old female with a past medical history significant for hypertension and hyperlipidemia, type 2 diabetes, hypothyroidism and history of gastric bypass surgery. She has been referred to me for iron deficiency anemia. Her bypass surgery was in 2013. She has been seen by Dr. Servando Snare and will soon be undergoing EGD and colonoscopy. Recent CBC from 02/23/2017 showed white count of 4.9, H&H of 10.8/34.6 with an MCV of 81.5 and a platelet count of 288. Iron studies showed a low serum iron of 18 and low iron saturation ratio of 3.8%. Ferritin was low at 6.2. Urinalysis showed no hematuria. B12 and folate from May 2018 were within normal limits. methylmalonic acid level was normal and intrinsic factor antibody was negative. TSH was normal at 1.53  She has been taking oral iron every other day since May. Her hemoglobin has been being between 10-11 since December 2017. Prior to that it was between 12-13. Family history of colon cancer in maternal aunt. She has never had a colonoscopy to date. Denies any melena or bleeding in her urine or stool. She has lost about 15 pounds of weight intentionally  Patient had a repeat colonoscopy on 05/01/2017 which showed overall poor bowel prep and repeat colonoscopy was recommended in 5 years. EGD revealed a normal esophagus. Evidence of gastric bypass. One nonbleeding jejunal ulcer with no stigmata of bleeding  Patient received 2 doses of feraheme in august 2018   Interval history- she felt better for a week or so after IV iron.  She feels fatigued again. She has not noticed any blood in stool. She was diagnosed with UTI recently and is on  antibiotics  ECOG PS- 0 Pain scale- 8  Review of systems- Review of Systems  Constitutional: Positive for malaise/fatigue. Negative for chills, fever and weight loss.  HENT: Negative for congestion, ear discharge and nosebleeds.   Eyes: Negative for blurred vision.  Respiratory: Negative for cough, hemoptysis, sputum production, shortness of breath and wheezing.   Cardiovascular: Negative for chest pain, palpitations, orthopnea and claudication.  Gastrointestinal: Negative for abdominal pain, blood in stool, constipation, diarrhea, heartburn, melena, nausea and vomiting.  Genitourinary: Negative for dysuria, flank pain, frequency, hematuria and urgency.  Musculoskeletal: Negative for back pain, joint pain and myalgias.  Skin: Negative for rash.  Neurological: Negative for dizziness, tingling, focal weakness, seizures, weakness and headaches.  Endo/Heme/Allergies: Does not bruise/bleed easily.  Psychiatric/Behavioral: Negative for depression and suicidal ideas. The patient does not have insomnia.        Allergies  Allergen Reactions  . Glipizide Nausea And Vomiting    REACTION: nausea  . Hydrocodone-Acetaminophen Itching  . Lyrica [Pregabalin]     Weight gain  . Metformin Diarrhea    REACTION: GI  . Penicillins Itching and Swelling    Has patient had a PCN reaction causing immediate rash, facial/tongue/throat swelling, SOB or lightheadedness with hypotension: yes Has patient had a PCN reaction causing severe rash involving mucus membranes or skin necrosis: no Has patient had a PCN reaction that required hospitalization : yes ed visit Has patient had a PCN reaction occurring within  the last 10 years: yes If all of the above answers are "NO", then may proceed with Cephalosporin use.   . Adhesive [Tape] Rash    Red, and itching     Past Medical History:  Diagnosis  Date  . Anemia   . Anxiety and depression   . Chronic back pain   . DM2 (diabetes mellitus, type 2) (HCC)    improved since gastric bypass  . Headache    stress/sinus - 2-3x/wk  . HLD (hyperlipidemia)   . HTN (hypertension)    improved since gastric bypass  . Hypothyroid   . Left leg weakness    s/p back surgery  . Obesity   . S/P insertion of spinal cord stimulator   . Wears contact lenses      Past Surgical History:  Procedure Laterality Date  . Abd [place] - gallstones  5/03  . BACK SURGERY  2009   decompressive laminectomy  . BARIATRIC SURGERY  03/02/2012   gastric bypass; Dr Smitty Cords  . CHOLECYSTECTOMY  6/03  . COLONOSCOPY WITH PROPOFOL N/A 05/01/2017   Procedure: COLONOSCOPY WITH PROPOFOL;  Surgeon: Midge Minium, MD;  Location: [facility];  Service: Gastroenterology;  Laterality: N/A;  . DILATION AND CURETTAGE OF UTERUS  1/05  . ESOPHAGOGASTRODUODENOSCOPY (EGD) WITH PROPOFOL N/A 05/01/2017   Procedure: ESOPHAGOGASTRODUODENOSCOPY (EGD) WITH PROPOFOL;  Surgeon: Midge Minium, MD;  Location: [facility];  Service: Gastroenterology;  Laterality: N/A;  . KNEE SURGERY     L - arthroscopic  . LIVER BIOPSY  6/03   fatty liver  . LUMBAR SPINE SURGERY  2015   Dr. Amalia Greenhouse  . NASAL SINUS SURGERY    . PANNICULECTOMY  08/2013   [facility], Dr. Diamond Nickel  . sleep study - apnea  04/2001   improved since gastric bypass    Social History   Social History  . Marital status: Married    Spouse name: N/A  . Number of children: N/A  . Years of education: N/A   Occupational History  . Not on file.   Social History Main Topics  . Smoking status: Never Smoker  . Smokeless tobacco: Never Used     Comment: tobacco use - no  . Alcohol use No  . Drug use: No  . Sexual activity: Not on file   Other Topics Concern  . Not on file   Social History Narrative   Single, no children; works at  [employer] at [employer], does not get regular exercise.     Family History    Problem Relation Age of Onset  . Lung cancer Father   . Diabetes Mother   . Diabetes Sister   . Fibromyalgia Sister   . Colon cancer Maternal Aunt   . Skin cancer Paternal Aunt   . Skin cancer Paternal Uncle      Current Outpatient Prescriptions:  .  aspirin EC 81 MG tablet, Take 1 tablet (81 mg total) by mouth daily., Disp: 30 tablet, Rfl: 11 .  baclofen (LIORESAL) 10 MG tablet, Take 10 mg by mouth daily., Disp: , Rfl:  .  buPROPion (WELLBUTRIN SR) 200 MG 12 hr tablet, Take 400 mg by mouth daily. , Disp: , Rfl:  .  ciprofloxacin (CIPRO) 500 MG tablet, Take 1 tablet (500 mg total) by mouth 2 (two) times daily., Disp: 20 tablet, Rfl: 0 .  DULoxetine (CYMBALTA) 60 MG capsule, Take 60 mg by mouth 2 (two) times daily., Disp: , Rfl:  .  ferrous sulfate (  EQL SLOW RELEASE IRON) 160 (50 Fe) MG TBCR SR tablet, Take 1 tablet (160 mg total) by mouth every other day., Disp: 30 each, Rfl: 6 .  gabapentin (NEURONTIN) 400 MG capsule, Take 800 mg by mouth 2 (two) times daily. , Disp: , Rfl:  .  ketorolac (TORADOL) 10 MG tablet, Take 1 tablet (10 mg total) by mouth every 6 (six) hours as needed., Disp: 20 tablet, Rfl: 0 .  levothyroxine (SYNTHROID, LEVOTHROID) 100 MCG tablet, TAKE 1 TABLET BY MOUTH EVERY DAY, Disp: 30 tablet, Rfl: 2 .  lidocaine (LIDODERM) 5 %, Place 1 patch onto the skin every 12 (twelve) hours. Remove & Discard patch within 12 hours or as directed by MD, Disp: 10 patch, Rfl: 0 .  Melatonin 2.5 MG CHEW, Chew 1 Dose by mouth at bedtime., Disp: , Rfl:  .  metaxalone (SKELAXIN) 800 MG tablet, Take 1 tablet (800 mg total) by mouth 3 (three) times daily., Disp: 21 tablet, Rfl: 0 .  sucralfate (CARAFATE) 1 GM/10ML suspension, Take 10 mLs (1 g total) by mouth 4 (four) times daily. (Patient not taking: Reported on 06/10/2017), Disp: 420 mL, Rfl: 3 .  tapentadol (NUCYNTA) 50 MG tablet, Take 100 mg by mouth 2 (two) times daily., Disp: , Rfl:  .  valACYclovir (VALTREX) 1000 MG tablet, Take 1 tablet  (1,000 mg total) by mouth 3 (three) times daily. (Patient not taking: Reported on 06/10/2017), Disp: 21 tablet, Rfl: 0  Physical exam:  Vitals:   06/23/17 1048  BP: 108/73  Pulse: 80  Resp: 14  Temp: 98.2 F (36.8 C)  TempSrc: Tympanic  Weight: 179 lb (81.2 kg)   Physical Exam  Constitutional: She is oriented to person, place, and time and well-developed, well-nourished, and in no distress.  HENT:  Head: Normocephalic and atraumatic.  Eyes: Pupils are equal, round, and reactive to light. EOM are normal.  Neck: Normal range of motion.  Cardiovascular: Normal rate, regular rhythm and normal heart sounds.   Pulmonary/Chest: Effort normal and breath sounds normal.  Abdominal: Soft. Bowel sounds are normal.  Neurological: She is alert and oriented to person, place, and time.  Skin: Skin is warm and dry.     CMP Latest Ref Rng & Units 06/11/2017  Glucose 65 - 99 mg/dL 78  BUN 6 - 20 mg/dL 12  Creatinine 1.61 - 0.96 mg/dL 0.45  Sodium 409 - 811 mmol/L 142  Potassium 3.5 - 5.1 mmol/L 3.7  Chloride 101 - 111 mmol/L 109  CO2 22 - 32 mmol/L 26  Calcium 8.9 - 10.3 mg/dL 9.1(Y)  Total Protein 6.5 - 8.1 g/dL -  Total Bilirubin 0.3 - 1.2 mg/dL -  Alkaline Phos 38 - 782 U/L -  AST 15 - 41 U/L -  ALT 14 - 54 U/L -   CBC Latest Ref Rng & Units 06/23/2017  WBC 3.6 - 11.0 K/uL 7.2  Hemoglobin 12.0 - 16.0 g/dL 95.6  Hematocrit 21.3 - 47.0 % 37.8  Platelets 150 - 440 K/uL 212    No images are attached to the encounter.  Dg Lumbar Spine Complete  Result Date: 06/20/2017 CLINICAL DATA:  Larey Seat 9 days ago with persistent back pain since then. EXAM: LUMBAR SPINE - COMPLETE 4+ VIEW COMPARISON:  08/16/2016 FINDINGS: Chronic curvature convex to the left. Previous diskectomy, decompression and fusion at L4-5. No change in appearance at this level. Mild disc space narrowing present at the other lumbar levels without change. No sign of compression fracture. Thoracic neurostimulator in place.  IMPRESSION: No acute or traumatic finding. Previous decompression, diskectomy and fusion at L4-5. Chronic lumbar spinal curvature. Mild lumbar disc space narrowing elsewhere similar to the previous study. Electronically Signed   By: Paulina Fusi M.D.   On: 06/20/2017 08:44   Ct Head Wo Contrast  Result Date: 06/10/2017 CLINICAL DATA:  Increased confusion EXAM: CT HEAD WITHOUT CONTRAST TECHNIQUE: Contiguous axial images were obtained from the base of the skull through the vertex without intravenous contrast. COMPARISON:  01/02/2017, 08/16/2016, MRI 09/27/2010 FINDINGS: Brain: No acute territorial infarction, hemorrhage or intracranial mass is visualized. Chronic encephalomalacia with calcification in the right frontal lobe. 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 Ex vacuo dilatation of the right lateral ventricle as before. Vascular: No hyperdense vessels. Scattered calcification at the carotid siphon Skull: No fracture or suspicious bone lesion Sinuses/Orbits: No acute finding. Other: None IMPRESSION: 1. No CT evidence for acute intracranial abnormality. 2. 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   On: 06/10/2017 15:30   US Carotid Bilateral  Result Date: 06/11/2017 CLINICAL DATA:  TIA. EXAM: BILATERAL CAROTID DUPLEX ULTRASOUND TECHNIQUE: Wallace Cullens scale imaging, color Doppler and duplex ultrasound were performed of bilateral carotid and vertebral arteries in the neck. COMPARISON:  CT 06/10/2017 . FINDINGS: Criteria: Quantification of carotid stenosis is based on velocity parameters that correlate the residual internal carotid diameter with NASCET-based stenosis levels, using the diameter of the distal internal carotid lumen as the denominator for stenosis measurement. The following velocity measurements were obtained: RIGHT ICA:  77/31 cm/sec CCA:  97/17 cm/sec SYSTOLIC ICA/CCA RATIO:  0.8 DIASTOLIC ICA/CCA RATIO:  1.8 ECA:  91 cm/sec LEFT ICA:   68/16 cm/sec CCA:  100/17 cm/sec SYSTOLIC ICA/CCA RATIO:  0.7 DIASTOLIC ICA/CCA RATIO:  1.0 ECA:  93 cm/sec RIGHT CAROTID ARTERY: No significant carotid atherosclerotic vascular disease. No flow limiting stenosis. RIGHT VERTEBRAL ARTERY:  Patent with antegrade flow. LEFT CAROTID ARTERY: No significant carotid atherosclerotic vascular disease. LEFT VERTEBRAL ARTERY:  Patent with antegrade flow . IMPRESSION: 1. No significant carotid atherosclerotic vascular disease. 2. Vertebrals are patent antegrade flow. Electronically Signed   By: Maisie Fus  Register   On: 06/11/2017 10:41   Ct Renal Stone Study  Result Date: 06/20/2017 CLINICAL DATA:  PT states increasing right sided pain. PT states that she did take a fall earlier this week, but right sided pain has worsened. Pt with hx of surgery to back and gallbladder removed. EXAM: CT ABDOMEN AND PELVIS WITHOUT CONTRAST TECHNIQUE: Multidetector CT imaging of the abdomen and pelvis was performed following the standard protocol without IV contrast. COMPARISON:  None. FINDINGS: Lower chest: No acute abnormality. Hepatobiliary: No focal liver abnormality is seen. Status post cholecystectomy. No biliary dilatation. Pancreas: Unremarkable. No pancreatic ductal dilatation or surrounding inflammatory changes. Spleen: Normal in size without focal abnormality. Adrenals/Urinary Tract: Adrenal glands are unremarkable. Kidneys are normal, without renal calculi, focal lesion, or hydronephrosis. Bladder is unremarkable. Stomach/Bowel: Stomach is within normal limits. No normal nor abnormal appendix is identified. No evidence of bowel wall thickening, distention, or inflammatory changes. Few small bowel air-fluid levels in the lower mid abdomen. Vascular/Lymphatic: No significant vascular findings are present. No enlarged abdominal or pelvic lymph nodes. Reproductive: Uterus and bilateral adnexa are unremarkable. Other: No abdominal wall hernia or abnormality. No abdominopelvic ascites.  Musculoskeletal: No acute osseous abnormality. No lytic or sclerotic osseous lesion. Posterior spinal fusion at L4-5. Spinal stimulator with the power pack in the right flank subcutaneous fat.  Metallic portion of the lead terminates in the midthoracic spine outside the field of view. IMPRESSION: 1. Few small bowel air-fluid levels in the lower mid abdomen may reflect mild enteritis secondary to an infectious or inflammatory etiology. No bowel wall thickening. 2. No evidence of bowel obstruction. Electronically Signed   By: Elige Ko   On: 06/20/2017 10:34     Assessment and plan- Patient is a 51 y.o. female with iron deficiency anemia likely due to gastric bypass  Hemoglobin improved to 12.4 from 10.8 as compared to 2 months ago. Iron studies from today pending. And will and I will see her back in 6 months Will hold off on IV iron at this time. Repeat cbc ferritin and iron studies and b12 in 3 and 6 months   Visit Diagnosis 1. Bariatric surgery status   2. Other iron deficiency anemia      Dr. Owens Shark, MD, MPH [facility] at [facility] Pager- [phone] 06/23/2017 1:03 PM

## 2017-06-23 ENCOUNTER — Inpatient Hospital Stay (HOSPITAL_BASED_OUTPATIENT_CLINIC_OR_DEPARTMENT_OTHER): Payer: BLUE CROSS/BLUE SHIELD | Admitting: Oncology

## 2017-06-23 ENCOUNTER — Encounter: Payer: Self-pay | Admitting: Oncology

## 2017-06-23 ENCOUNTER — Telehealth: Payer: Self-pay | Admitting: *Deleted

## 2017-06-23 ENCOUNTER — Inpatient Hospital Stay: Payer: BLUE CROSS/BLUE SHIELD | Attending: Oncology

## 2017-06-23 VITALS — BP 108/73 | HR 80 | Temp 98.2°F | Resp 14 | Wt 179.0 lb

## 2017-06-23 DIAGNOSIS — Z801 Family history of malignant neoplasm of trachea, bronchus and lung: Secondary | ICD-10-CM | POA: Insufficient documentation

## 2017-06-23 DIAGNOSIS — F329 Major depressive disorder, single episode, unspecified: Secondary | ICD-10-CM | POA: Diagnosis not present

## 2017-06-23 DIAGNOSIS — E785 Hyperlipidemia, unspecified: Secondary | ICD-10-CM

## 2017-06-23 DIAGNOSIS — Z9884 Bariatric surgery status: Secondary | ICD-10-CM | POA: Diagnosis not present

## 2017-06-23 DIAGNOSIS — Z7982 Long term (current) use of aspirin: Secondary | ICD-10-CM | POA: Diagnosis not present

## 2017-06-23 DIAGNOSIS — R5383 Other fatigue: Secondary | ICD-10-CM | POA: Diagnosis not present

## 2017-06-23 DIAGNOSIS — Z800 Family history of malignant neoplasm of digestive organs: Secondary | ICD-10-CM | POA: Diagnosis not present

## 2017-06-23 DIAGNOSIS — E039 Hypothyroidism, unspecified: Secondary | ICD-10-CM | POA: Diagnosis not present

## 2017-06-23 DIAGNOSIS — G9389 Other specified disorders of brain: Secondary | ICD-10-CM | POA: Insufficient documentation

## 2017-06-23 DIAGNOSIS — E119 Type 2 diabetes mellitus without complications: Secondary | ICD-10-CM | POA: Insufficient documentation

## 2017-06-23 DIAGNOSIS — Z79899 Other long term (current) drug therapy: Secondary | ICD-10-CM | POA: Diagnosis not present

## 2017-06-23 DIAGNOSIS — D508 Other iron deficiency anemias: Secondary | ICD-10-CM

## 2017-06-23 DIAGNOSIS — I10 Essential (primary) hypertension: Secondary | ICD-10-CM

## 2017-06-23 DIAGNOSIS — E669 Obesity, unspecified: Secondary | ICD-10-CM

## 2017-06-23 DIAGNOSIS — Z8673 Personal history of transient ischemic attack (TIA), and cerebral infarction without residual deficits: Secondary | ICD-10-CM

## 2017-06-23 DIAGNOSIS — N390 Urinary tract infection, site not specified: Secondary | ICD-10-CM | POA: Insufficient documentation

## 2017-06-23 DIAGNOSIS — F419 Anxiety disorder, unspecified: Secondary | ICD-10-CM | POA: Diagnosis not present

## 2017-06-23 DIAGNOSIS — D509 Iron deficiency anemia, unspecified: Secondary | ICD-10-CM | POA: Diagnosis not present

## 2017-06-23 DIAGNOSIS — M549 Dorsalgia, unspecified: Secondary | ICD-10-CM

## 2017-06-23 DIAGNOSIS — Z808 Family history of malignant neoplasm of other organs or systems: Secondary | ICD-10-CM | POA: Insufficient documentation

## 2017-06-23 DIAGNOSIS — G8929 Other chronic pain: Secondary | ICD-10-CM | POA: Diagnosis not present

## 2017-06-23 LAB — IRON AND TIBC
Iron: 77 ug/dL (ref 28–170)
SATURATION RATIOS: 26 % (ref 10.4–31.8)
TIBC: 297 ug/dL (ref 250–450)
UIBC: 220 ug/dL

## 2017-06-23 LAB — CBC WITH DIFFERENTIAL/PLATELET
BASOS ABS: 0 10*3/uL (ref 0–0.1)
Basophils Relative: 1 %
Eosinophils Absolute: 0.3 10*3/uL (ref 0–0.7)
Eosinophils Relative: 4 %
HEMATOCRIT: 37.8 % (ref 35.0–47.0)
Hemoglobin: 12.4 g/dL (ref 12.0–16.0)
LYMPHS ABS: 1.3 10*3/uL (ref 1.0–3.6)
LYMPHS PCT: 18 %
MCH: 30.1 pg (ref 26.0–34.0)
MCHC: 32.9 g/dL (ref 32.0–36.0)
MCV: 91.6 fL (ref 80.0–100.0)
MONO ABS: 0.4 10*3/uL (ref 0.2–0.9)
Monocytes Relative: 6 %
NEUTROS ABS: 5.2 10*3/uL (ref 1.4–6.5)
Neutrophils Relative %: 73 %
Platelets: 212 10*3/uL (ref 150–440)
RBC: 4.13 MIL/uL (ref 3.80–5.20)
RDW: 17.7 % — AB (ref 11.5–14.5)
WBC: 7.2 10*3/uL (ref 3.6–11.0)

## 2017-06-23 LAB — FERRITIN: Ferritin: 69 ng/mL (ref 11–307)

## 2017-06-23 NOTE — Telephone Encounter (Signed)
-----   Message from Creig Hines, MD sent at 06/23/2017  1:04 PM EDT ----- Please let her know tomorrow- no need for IV iron at this time. Iron studies have improved

## 2017-06-23 NOTE — Telephone Encounter (Signed)
Called patient and got her voicemail and left her a message that iron levels were good and pt does not need any IV iron at this time and if pt needs to call me back with any questions and I left her my direct number.

## 2017-06-23 NOTE — Progress Notes (Signed)
Patient states that she is doing well, other than having chronic back pain from a back injury. She has already had her flu vaccine.

## 2017-07-02 DIAGNOSIS — M5416 Radiculopathy, lumbar region: Secondary | ICD-10-CM | POA: Diagnosis not present

## 2017-07-02 DIAGNOSIS — M5136 Other intervertebral disc degeneration, lumbar region: Secondary | ICD-10-CM | POA: Diagnosis not present

## 2017-07-02 DIAGNOSIS — G894 Chronic pain syndrome: Secondary | ICD-10-CM | POA: Diagnosis not present

## 2017-07-02 DIAGNOSIS — M545 Low back pain: Secondary | ICD-10-CM | POA: Diagnosis not present

## 2017-07-23 DIAGNOSIS — M461 Sacroiliitis, not elsewhere classified: Secondary | ICD-10-CM | POA: Diagnosis not present

## 2017-07-31 DIAGNOSIS — M533 Sacrococcygeal disorders, not elsewhere classified: Secondary | ICD-10-CM | POA: Diagnosis not present

## 2017-08-14 ENCOUNTER — Other Ambulatory Visit: Payer: Self-pay | Admitting: Family

## 2017-08-19 DIAGNOSIS — M961 Postlaminectomy syndrome, not elsewhere classified: Secondary | ICD-10-CM | POA: Diagnosis not present

## 2017-08-19 DIAGNOSIS — M545 Low back pain: Secondary | ICD-10-CM | POA: Diagnosis not present

## 2017-08-19 DIAGNOSIS — G894 Chronic pain syndrome: Secondary | ICD-10-CM | POA: Diagnosis not present

## 2017-08-29 ENCOUNTER — Other Ambulatory Visit: Payer: Self-pay | Admitting: Family

## 2017-09-02 DIAGNOSIS — M545 Low back pain: Secondary | ICD-10-CM | POA: Diagnosis not present

## 2017-09-02 DIAGNOSIS — M5416 Radiculopathy, lumbar region: Secondary | ICD-10-CM | POA: Diagnosis not present

## 2017-09-02 DIAGNOSIS — M961 Postlaminectomy syndrome, not elsewhere classified: Secondary | ICD-10-CM | POA: Diagnosis not present

## 2017-09-02 DIAGNOSIS — G894 Chronic pain syndrome: Secondary | ICD-10-CM | POA: Diagnosis not present

## 2017-09-22 ENCOUNTER — Inpatient Hospital Stay: Payer: BLUE CROSS/BLUE SHIELD

## 2017-09-30 DIAGNOSIS — M5416 Radiculopathy, lumbar region: Secondary | ICD-10-CM | POA: Diagnosis not present

## 2017-09-30 DIAGNOSIS — M961 Postlaminectomy syndrome, not elsewhere classified: Secondary | ICD-10-CM | POA: Diagnosis not present

## 2017-09-30 DIAGNOSIS — M545 Low back pain: Secondary | ICD-10-CM | POA: Diagnosis not present

## 2017-09-30 DIAGNOSIS — G894 Chronic pain syndrome: Secondary | ICD-10-CM | POA: Diagnosis not present

## 2017-09-30 DIAGNOSIS — Z79891 Long term (current) use of opiate analgesic: Secondary | ICD-10-CM | POA: Diagnosis not present

## 2017-10-28 ENCOUNTER — Ambulatory Visit (INDEPENDENT_AMBULATORY_CARE_PROVIDER_SITE_OTHER): Payer: BLUE CROSS/BLUE SHIELD | Admitting: Family

## 2017-10-28 ENCOUNTER — Encounter: Payer: Self-pay | Admitting: Family

## 2017-10-28 ENCOUNTER — Telehealth: Payer: Self-pay

## 2017-10-28 VITALS — BP 116/82 | HR 71 | Temp 97.8°F | Resp 16 | Ht 65.0 in | Wt 215.5 lb

## 2017-10-28 DIAGNOSIS — Z1231 Encounter for screening mammogram for malignant neoplasm of breast: Secondary | ICD-10-CM

## 2017-10-28 DIAGNOSIS — E039 Hypothyroidism, unspecified: Secondary | ICD-10-CM

## 2017-10-28 DIAGNOSIS — F418 Other specified anxiety disorders: Secondary | ICD-10-CM

## 2017-10-28 DIAGNOSIS — Z1239 Encounter for other screening for malignant neoplasm of breast: Secondary | ICD-10-CM

## 2017-10-28 LAB — TSH: TSH: 1.29 u[IU]/mL (ref 0.35–4.50)

## 2017-10-28 MED ORDER — BUPROPION HCL ER (XL) 150 MG PO TB24
300.0000 mg | ORAL_TABLET | Freq: Every day | ORAL | 1 refills | Status: DC
Start: 2017-10-28 — End: 2018-02-26

## 2017-10-28 NOTE — Assessment & Plan Note (Addendum)
Would like wellbutrin to be managed in our office. Increased fatigue which we agreed likely to be feature of depression. Historically has done well on wellbutrin. Due to concerns for weight gain ( h/o gastric bypass), we will change wellbutrin from SR to XL to see if this makes medication more effective. If fails, we may trial another SSRI or consult psyc. Encouraged exercise Follow up one month.

## 2017-10-28 NOTE — Patient Instructions (Signed)
Trial wellbutrin XL - stop taking the wellbutrin SR.   Labs today  Come back for physical  EXCERCISE  We placed a referral for mammogram this year. I asked that you call one the below locations and schedule this when it is convenient for you.   As discussed, I would like you to ask for 3D mammogram over the traditional 2D mammogram as new evidence suggest 3D is superior.   Please note that NOT all insurance companies cover 3D and you may have to pay a higher copay. You may call your insurance company to further clarify your benefits.   Options for Mammogram.    [facility]  [address]  [place], [place]  [phone]  * [facility]*   [facility] [address] [place], [place] [phone] * Note if you ask for 3D mammogram at this location, you must request [place], [place] location*

## 2017-10-28 NOTE — Telephone Encounter (Signed)
Left message to return call patient scheduled for mammogram on 11/10/17 @4 :00pm @ [facility]

## 2017-10-28 NOTE — Progress Notes (Signed)
Subjective:    Patient ID: [name], female    DOB: [birth year], 52 y.o.   MRN: [id number]  CC: [name] is a 52 y.o. female who presents today for follow up.   HPI: Follow [facility] at [facility] for Pain management ; no  Longer seeing Dr [name].  Receiving Fentanyl from Dr [name].   Not sure if wellbutrin is helping. No thoughts of hurting herself or anyone else.  Doesn't feel very social and sleeping a lot during the day. Feels like in mental fog, cannot rememeber 'like used too.' Not working out.  Supportive partner at home.   Has been on wellbutrin for 2 years.   Has been on zoloft, prozac, effexor   IDA- resolved; no longer following with Dr Smith Becker  HISTORY:  Past Medical History:  Diagnosis Date  . Anemia   . Anxiety and depression   . Chronic back pain   . DM2 (diabetes mellitus, type 2) (HCC)    improved since gastric bypass  . Headache    stress/sinus - 2-3x/wk  . HLD (hyperlipidemia)   . HTN (hypertension)    improved since gastric bypass  . Hypothyroid   . Left leg weakness    s/p back surgery  . Obesity   . S/P insertion of spinal cord stimulator   . Wears contact lenses    Past Surgical History:  Procedure Laterality Date  . Abd [place] - gallstones  5/03  . BACK SURGERY  2009   decompressive laminectomy  . BARIATRIC SURGERY  03/02/2012   gastric bypass; Dr [name]  . CHOLECYSTECTOMY  6/03  . COLONOSCOPY WITH PROPOFOL N/A 05/01/2017   Procedure: COLONOSCOPY WITH PROPOFOL;  Surgeon: Cheryl Minium, MD;  Location: [facility];  Service: Gastroenterology;  Laterality: N/A;  . DILATION AND CURETTAGE OF UTERUS  1/05  . ESOPHAGOGASTRODUODENOSCOPY (EGD) WITH PROPOFOL N/A 05/01/2017   Procedure: ESOPHAGOGASTRODUODENOSCOPY (EGD) WITH PROPOFOL;  Surgeon: Cheryl Minium, MD;  Location: [facility];  Service: Gastroenterology;  Laterality: N/A;  . KNEE SURGERY     L - arthroscopic  . LIVER BIOPSY  6/03   fatty liver  . LUMBAR SPINE  SURGERY  2015   Dr. Amalia Greenhouse  . NASAL SINUS SURGERY    . PANNICULECTOMY  08/2013   [name], [name]  . sleep study - apnea  04/2001   improved since gastric bypass   Family History  Problem Relation Age of Onset  . Lung cancer Father   . Diabetes Mother   . Diabetes Sister   . Fibromyalgia Sister   . Colon cancer Maternal Aunt   . Skin cancer Paternal Aunt   . Skin cancer Paternal Uncle     Allergies: Glipizide; Hydrocodone-acetaminophen; Lyrica [pregabalin]; Metformin; Penicillins; and Adhesive [tape] Current Outpatient Medications on File Prior to Visit  Medication Sig Dispense Refill  . aspirin EC 81 MG tablet Take 1 tablet (81 mg total) by mouth daily. 30 tablet 11  . baclofen (LIORESAL) 10 MG tablet Take 10 mg by mouth daily.    . DULoxetine (CYMBALTA) 60 MG capsule Take 60 mg by mouth 2 (two) times daily.    . fentaNYL (DURAGESIC - DOSED MCG/HR) 50 MCG/HR APPLY 1 PATCH(ES) EVERY 72 HOURS BY TRANSDERMAL ROUTE.  0  . ferrous sulfate (EQL SLOW RELEASE IRON) 160 (50 Fe) MG TBCR SR tablet Take 1 tablet (160 mg total) by mouth every other day. 30 each 6  . gabapentin (NEURONTIN) 400 MG capsule Take 800 mg  by mouth 2 (two) times daily.     Marland Kitchen levothyroxine (SYNTHROID, LEVOTHROID) 100 MCG tablet TAKE 1 TABLET BY MOUTH EVERY DAY 30 tablet 2  . levothyroxine (SYNTHROID, LEVOTHROID) 100 MCG tablet TAKE 1 TABLET BY MOUTH EVERY DAY 30 tablet 2  . Melatonin 2.5 MG CHEW Chew 1 Dose by mouth at bedtime.     No current facility-administered medications on file prior to visit.     Social History   Tobacco Use  . Smoking status: Never Smoker  . Smokeless tobacco: Never Used  . Tobacco comment: tobacco use - no  Substance Use Topics  . Alcohol use: No  . Drug use: No    Review of Systems  Constitutional: Negative for chills, fever and unexpected weight change.  HENT: Negative for congestion.   Respiratory: Negative for cough.   Cardiovascular: Negative for chest pain, palpitations  and leg swelling.  Gastrointestinal: Negative for nausea and vomiting.  Musculoskeletal: Negative for arthralgias and myalgias.  Skin: Negative for rash.  Neurological: Negative for headaches.  Hematological: Negative for adenopathy.  Psychiatric/Behavioral: Positive for decreased concentration. Negative for confusion, sleep disturbance and suicidal ideas.      Objective:    BP 116/82 (BP Location: Left Arm, Patient Position: Sitting, Cuff Size: Large)   Pulse 71   Temp 97.8 F (36.6 C) (Oral)   Resp 16   Ht 5\' 5"  (1.651 m)   Wt 215 lb 8 oz (97.8 kg)   LMP 12/16/2015   SpO2 97%   BMI 35.86 kg/m  BP Readings from Last 3 Encounters:  10/28/17 116/82  06/23/17 108/73  06/20/17 130/70   Wt Readings from Last 3 Encounters:  10/28/17 215 lb 8 oz (97.8 kg)  06/23/17 179 lb (81.2 kg)  06/20/17 182 lb (82.6 kg)    Physical Exam  Constitutional: She appears well-developed and well-nourished.  Eyes: Conjunctivae are normal.  Cardiovascular: Normal rate, regular rhythm, normal heart sounds and normal pulses.  Pulmonary/Chest: Effort normal and breath sounds normal. She has no wheezes. She has no rhonchi. She has no rales.  Neurological: She is alert.  Skin: Skin is warm and dry.  Psychiatric: She has a normal mood and affect. Her speech is normal and behavior is normal. Thought content normal.  Vitals reviewed.      Assessment & Plan:   Problem List Items Addressed This Visit      Endocrine   Hypothyroidism    In context of increased fatigue, pending tsh. Declines testing for anemia since resolved 06/2017.      Relevant Orders   TSH     Other   Depression with anxiety - Primary    Would like wellbutrin to be managed in our office. Increased fatigue which we agreed likely to be feature of depression. Historically has done well on wellbutrin. Due to concerns for weight gain ( h/o gastric bypass), we will change wellbutrin from SR to XL to see if this makes medication more  effective. If fails, we may trial another SSRI or consult psyc. Encouraged exercise Follow up one month.       Relevant Medications   buPROPion (WELLBUTRIN XL) 150 MG 24 hr tablet   Screening for breast cancer   Relevant Orders   MM SCREENING BREAST TOMO BILATERAL       I have discontinued Chelsy C. Noteboom's buPROPion, tapentadol, metaxalone, ketorolac, ciprofloxacin, and lidocaine. I am also having her start on buPROPion. Additionally, I am having her maintain her baclofen, DULoxetine, gabapentin, ferrous  sulfate, Melatonin, aspirin EC, levothyroxine, levothyroxine, and fentaNYL.   Meds ordered this encounter  Medications  . buPROPion (WELLBUTRIN XL) 150 MG 24 hr tablet    Sig: Take 2 tablets (300 mg total) by mouth daily.    Dispense:  120 tablet    Refill:  1    Order Specific Question:   Supervising Provider    Answer:   Sherlene ShamsULLO, TERESA L [[id number]]    Return precautions given.   Risks, benefits, and alternatives of the medications and treatment plan prescribed today were discussed, and patient expressed understanding.   Education regarding symptom management and diagnosis given to patient on AVS.  Continue to follow with Allegra GranaArnett, Belen Zwahlen G, FNP for routine health maintenance.   Cheryl StainMelissa Carol Flurry and I agreed with plan.   Rennie PlowmanMargaret Emoree Sasaki, FNP

## 2017-10-28 NOTE — Assessment & Plan Note (Signed)
In context of increased fatigue, pending tsh. Declines testing for anemia since resolved 06/2017.

## 2017-10-29 NOTE — Telephone Encounter (Signed)
Mychart message sent.

## 2017-11-11 DIAGNOSIS — M545 Low back pain: Secondary | ICD-10-CM | POA: Diagnosis not present

## 2017-11-11 DIAGNOSIS — G894 Chronic pain syndrome: Secondary | ICD-10-CM | POA: Diagnosis not present

## 2017-12-11 ENCOUNTER — Encounter: Payer: BLUE CROSS/BLUE SHIELD | Admitting: Family

## 2017-12-22 ENCOUNTER — Inpatient Hospital Stay: Payer: BLUE CROSS/BLUE SHIELD | Admitting: Oncology

## 2017-12-22 ENCOUNTER — Inpatient Hospital Stay: Payer: BLUE CROSS/BLUE SHIELD

## 2018-01-20 DIAGNOSIS — M7062 Trochanteric bursitis, left hip: Secondary | ICD-10-CM | POA: Diagnosis not present

## 2018-01-20 DIAGNOSIS — Z6835 Body mass index (BMI) 35.0-35.9, adult: Secondary | ICD-10-CM | POA: Diagnosis not present

## 2018-01-20 DIAGNOSIS — G4733 Obstructive sleep apnea (adult) (pediatric): Secondary | ICD-10-CM | POA: Diagnosis not present

## 2018-02-03 DIAGNOSIS — Z9884 Bariatric surgery status: Secondary | ICD-10-CM | POA: Diagnosis not present

## 2018-02-26 ENCOUNTER — Other Ambulatory Visit: Payer: Self-pay | Admitting: Family

## 2018-02-26 DIAGNOSIS — F418 Other specified anxiety disorders: Secondary | ICD-10-CM

## 2018-02-26 NOTE — Telephone Encounter (Signed)
Cancelled appt 12/11/17 Last office visit 10/28/17 Last refill 01/31/18

## 2018-02-27 NOTE — Telephone Encounter (Signed)
Refilled wellbutrin  Please call pt and let her know.   Also please advise she makes a f/u appt

## 2018-03-02 NOTE — Telephone Encounter (Signed)
Tried calling patient unable to reach

## 2018-03-04 NOTE — Telephone Encounter (Signed)
Mychart message sent.

## 2018-03-10 DIAGNOSIS — M5416 Radiculopathy, lumbar region: Secondary | ICD-10-CM | POA: Diagnosis not present

## 2018-03-10 DIAGNOSIS — M545 Low back pain: Secondary | ICD-10-CM | POA: Diagnosis not present

## 2018-03-16 DIAGNOSIS — E669 Obesity, unspecified: Secondary | ICD-10-CM | POA: Diagnosis not present

## 2018-03-16 DIAGNOSIS — Z01818 Encounter for other preprocedural examination: Secondary | ICD-10-CM | POA: Diagnosis not present

## 2018-03-16 DIAGNOSIS — Z9884 Bariatric surgery status: Secondary | ICD-10-CM | POA: Diagnosis not present

## 2018-03-16 DIAGNOSIS — G4739 Other sleep apnea: Secondary | ICD-10-CM | POA: Diagnosis not present

## 2018-03-16 DIAGNOSIS — Z713 Dietary counseling and surveillance: Secondary | ICD-10-CM | POA: Diagnosis not present

## 2018-03-24 DIAGNOSIS — M5416 Radiculopathy, lumbar region: Secondary | ICD-10-CM | POA: Diagnosis not present

## 2018-03-28 ENCOUNTER — Other Ambulatory Visit: Payer: Self-pay | Admitting: Family

## 2018-03-31 DIAGNOSIS — Z6835 Body mass index (BMI) 35.0-35.9, adult: Secondary | ICD-10-CM | POA: Diagnosis not present

## 2018-03-31 DIAGNOSIS — G4733 Obstructive sleep apnea (adult) (pediatric): Secondary | ICD-10-CM | POA: Diagnosis not present

## 2018-04-13 DIAGNOSIS — F330 Major depressive disorder, recurrent, mild: Secondary | ICD-10-CM | POA: Diagnosis not present

## 2018-04-16 DIAGNOSIS — K283 Acute gastrojejunal ulcer without hemorrhage or perforation: Secondary | ICD-10-CM | POA: Diagnosis not present

## 2018-04-16 DIAGNOSIS — Z9889 Other specified postprocedural states: Secondary | ICD-10-CM | POA: Diagnosis not present

## 2018-04-16 DIAGNOSIS — G4730 Sleep apnea, unspecified: Secondary | ICD-10-CM | POA: Diagnosis not present

## 2018-04-16 DIAGNOSIS — Z6838 Body mass index (BMI) 38.0-38.9, adult: Secondary | ICD-10-CM | POA: Diagnosis not present

## 2018-04-16 DIAGNOSIS — K219 Gastro-esophageal reflux disease without esophagitis: Secondary | ICD-10-CM | POA: Diagnosis not present

## 2018-04-16 DIAGNOSIS — Z8711 Personal history of peptic ulcer disease: Secondary | ICD-10-CM | POA: Diagnosis not present

## 2018-04-16 DIAGNOSIS — Z9884 Bariatric surgery status: Secondary | ICD-10-CM | POA: Diagnosis not present

## 2018-04-16 DIAGNOSIS — K9189 Other postprocedural complications and disorders of digestive system: Secondary | ICD-10-CM | POA: Diagnosis not present

## 2018-04-16 DIAGNOSIS — Z79899 Other long term (current) drug therapy: Secondary | ICD-10-CM | POA: Diagnosis not present

## 2018-05-02 ENCOUNTER — Encounter: Payer: Self-pay | Admitting: Family

## 2018-05-07 ENCOUNTER — Other Ambulatory Visit: Payer: Self-pay | Admitting: Family

## 2018-05-07 DIAGNOSIS — F418 Other specified anxiety disorders: Secondary | ICD-10-CM

## 2018-05-07 DIAGNOSIS — M533 Sacrococcygeal disorders, not elsewhere classified: Secondary | ICD-10-CM | POA: Diagnosis not present

## 2018-06-02 DIAGNOSIS — Z6839 Body mass index (BMI) 39.0-39.9, adult: Secondary | ICD-10-CM | POA: Diagnosis not present

## 2018-06-02 DIAGNOSIS — G4733 Obstructive sleep apnea (adult) (pediatric): Secondary | ICD-10-CM | POA: Diagnosis not present

## 2018-06-02 DIAGNOSIS — K283 Acute gastrojejunal ulcer without hemorrhage or perforation: Secondary | ICD-10-CM | POA: Diagnosis not present

## 2018-06-30 DIAGNOSIS — M5416 Radiculopathy, lumbar region: Secondary | ICD-10-CM | POA: Diagnosis not present

## 2018-07-14 DIAGNOSIS — M533 Sacrococcygeal disorders, not elsewhere classified: Secondary | ICD-10-CM | POA: Diagnosis not present

## 2018-07-27 ENCOUNTER — Other Ambulatory Visit: Payer: Self-pay

## 2018-07-27 MED ORDER — LEVOTHYROXINE SODIUM 100 MCG PO TABS: 100.0000 ug | ORAL_TABLET | Freq: Every day | ORAL | 1 refills | Status: DC

## 2018-08-04 DIAGNOSIS — Z6838 Body mass index (BMI) 38.0-38.9, adult: Secondary | ICD-10-CM | POA: Diagnosis not present

## 2018-08-04 DIAGNOSIS — G4733 Obstructive sleep apnea (adult) (pediatric): Secondary | ICD-10-CM | POA: Diagnosis not present

## 2018-08-04 DIAGNOSIS — K283 Acute gastrojejunal ulcer without hemorrhage or perforation: Secondary | ICD-10-CM | POA: Diagnosis not present

## 2018-08-11 ENCOUNTER — Ambulatory Visit: Payer: BLUE CROSS/BLUE SHIELD | Admitting: Family

## 2018-08-18 ENCOUNTER — Ambulatory Visit: Payer: BLUE CROSS/BLUE SHIELD | Admitting: Family

## 2018-08-18 DIAGNOSIS — Z0289 Encounter for other administrative examinations: Secondary | ICD-10-CM

## 2018-09-07 DIAGNOSIS — R42 Dizziness and giddiness: Secondary | ICD-10-CM | POA: Diagnosis not present

## 2018-09-13 ENCOUNTER — Other Ambulatory Visit: Payer: Self-pay | Admitting: Otolaryngology

## 2018-09-13 DIAGNOSIS — R42 Dizziness and giddiness: Secondary | ICD-10-CM

## 2018-09-13 DIAGNOSIS — I6509 Occlusion and stenosis of unspecified vertebral artery: Secondary | ICD-10-CM

## 2018-09-21 ENCOUNTER — Other Ambulatory Visit: Payer: BLUE CROSS/BLUE SHIELD

## 2018-09-21 ENCOUNTER — Ambulatory Visit
Admission: RE | Admit: 2018-09-21 | Discharge: 2018-09-21 | Disposition: A | Discharge status: Home / Self Care | Payer: PRIVATE HEALTH INSURANCE | Source: Ambulatory Visit | Attending: Otolaryngology | Admitting: Otolaryngology

## 2018-09-21 DIAGNOSIS — R42 Dizziness and giddiness: Secondary | ICD-10-CM | POA: Diagnosis present

## 2018-09-21 DIAGNOSIS — I6509 Occlusion and stenosis of unspecified vertebral artery: Secondary | ICD-10-CM | POA: Insufficient documentation

## 2018-09-21 LAB — POCT I-STAT CREATININE: Creatinine, Ser: 0.8 mg/dL (ref 0.44–1.00)

## 2018-09-21 MED ORDER — IOPAMIDOL (ISOVUE-370) INJECTION 76%
75.0000 mL | Freq: Once | INTRAVENOUS | Status: AC | PRN
  Administered 2018-09-21: 75 mL via INTRAVENOUS

## 2018-10-08 IMAGING — US US CAROTID DUPLEX BILAT
1 series · 13 of 24 positions shown · non-contrast
Comparison: CT 06/10/2017 .

CLINICAL DATA: TIA.

EXAM:
BILATERAL CAROTID DUPLEX ULTRASOUND
TECHNIQUE: Gray scale imaging, color Doppler and duplex ultrasound were
performed of bilateral carotid and vertebral arteries in the neck.

[Series 1: us carotid duplex bilat · 13 of 64 slices shown]
[im 1/64]
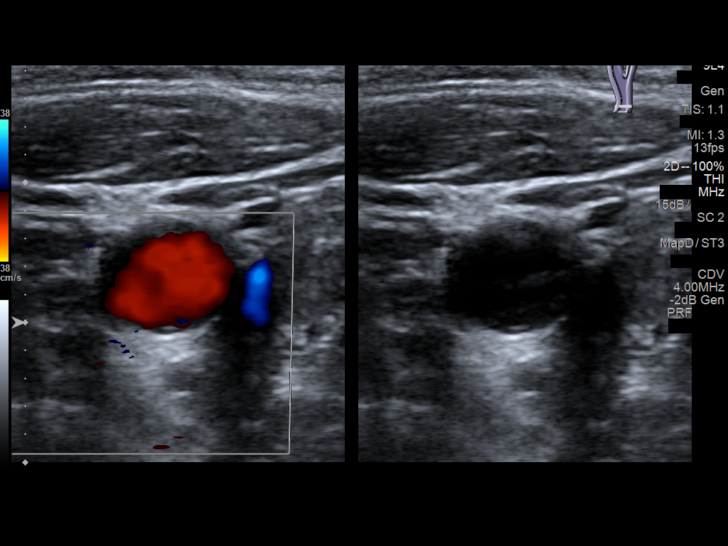
[im 6/64]
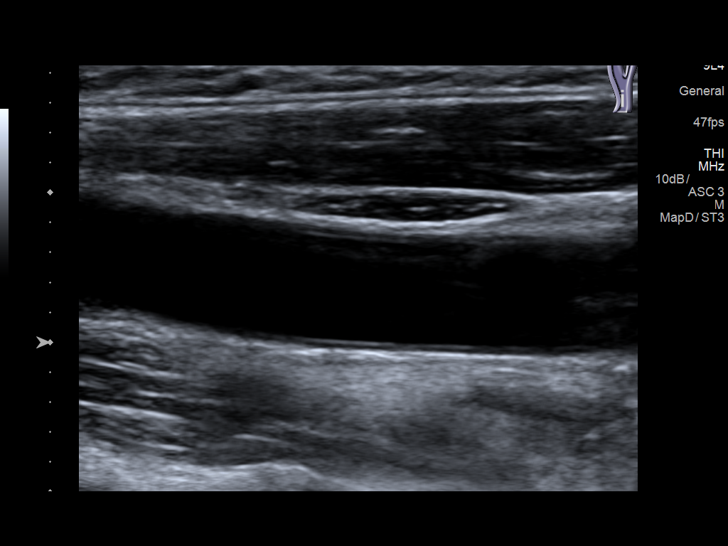
[im 11/64]
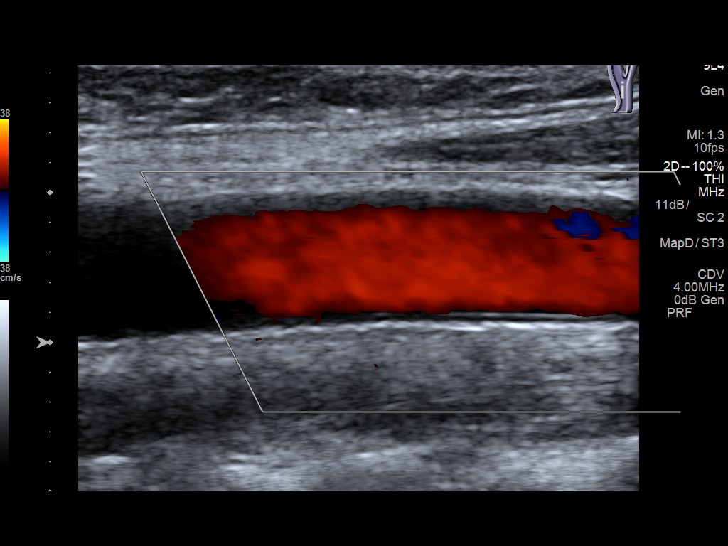
[im 17/64]
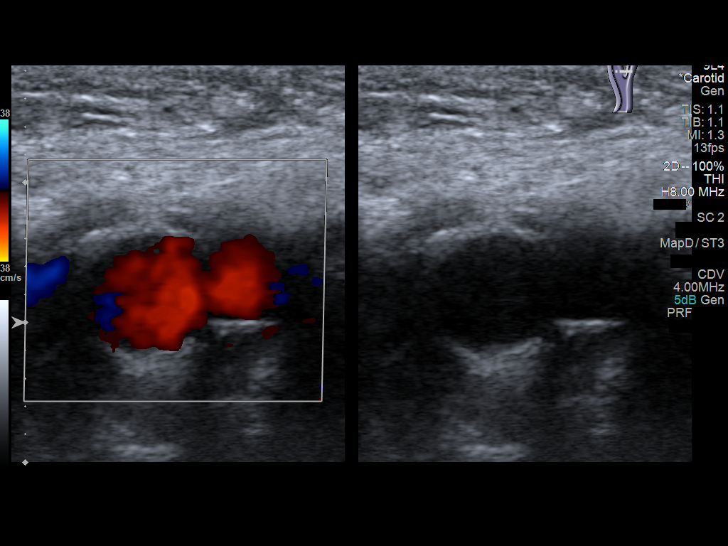
[im 22/64]
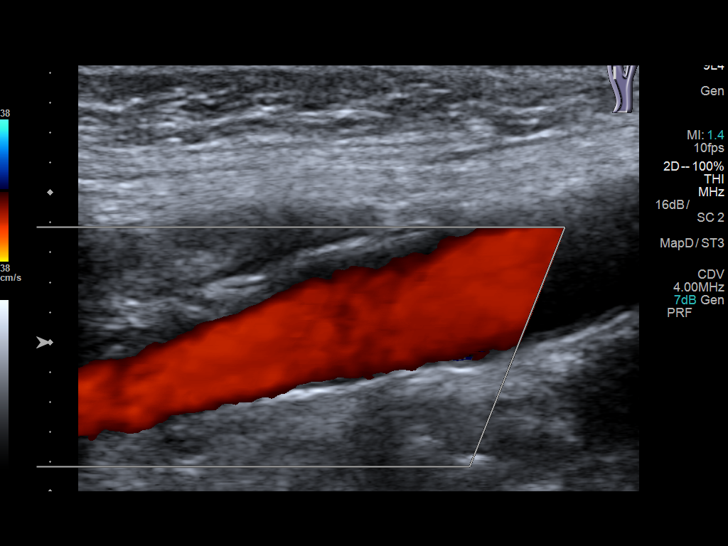
[im 28/64]
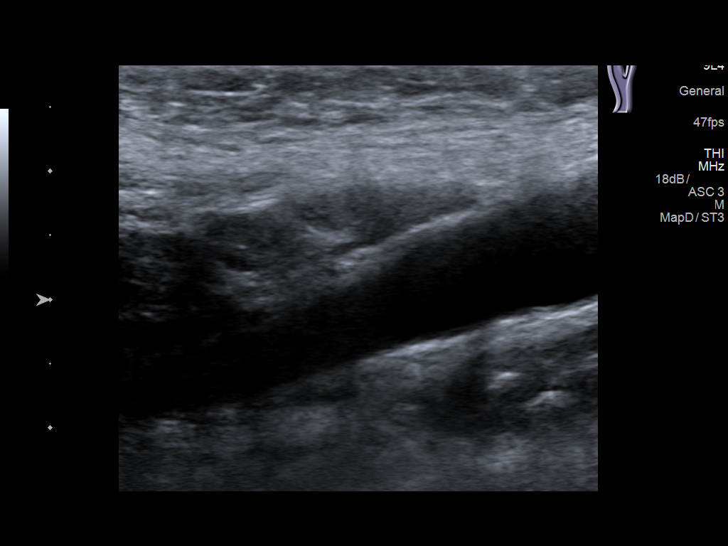
[im 33/64]
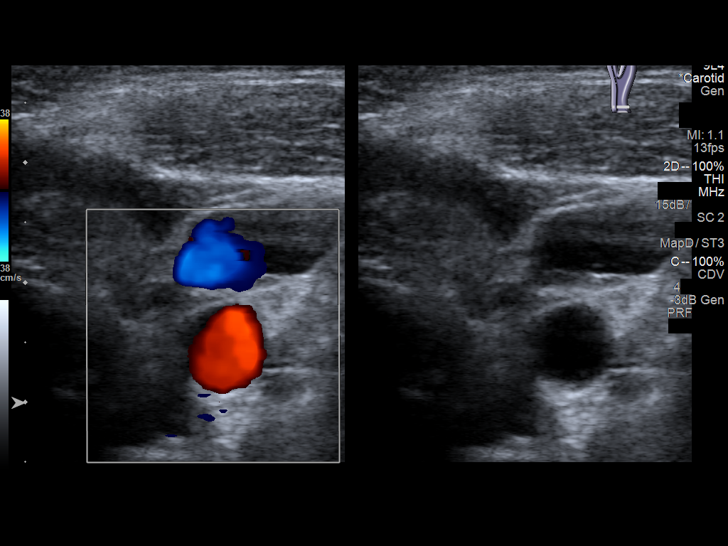
[im 36/64]
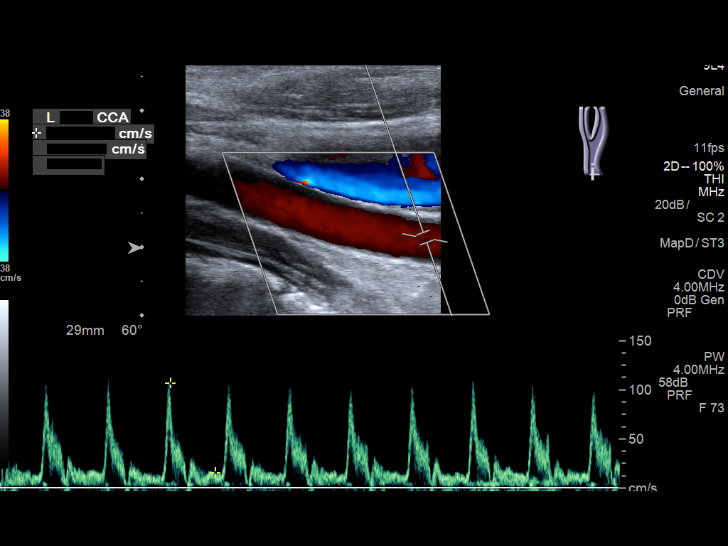
[im 42/64]
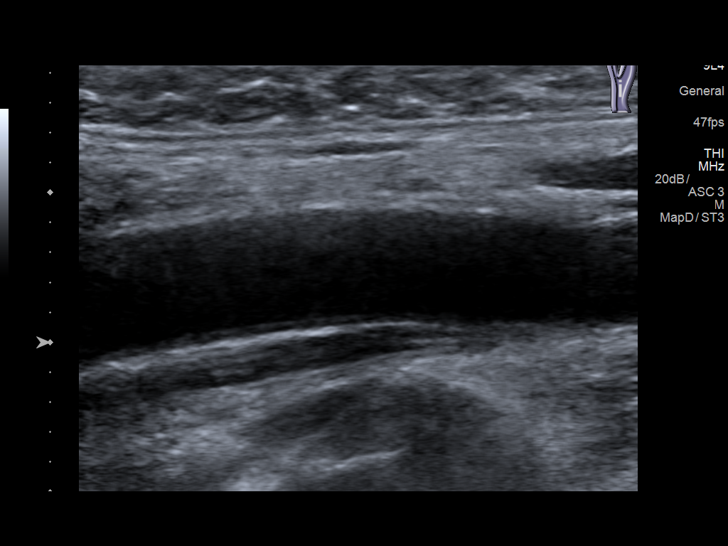
[im 47/64]
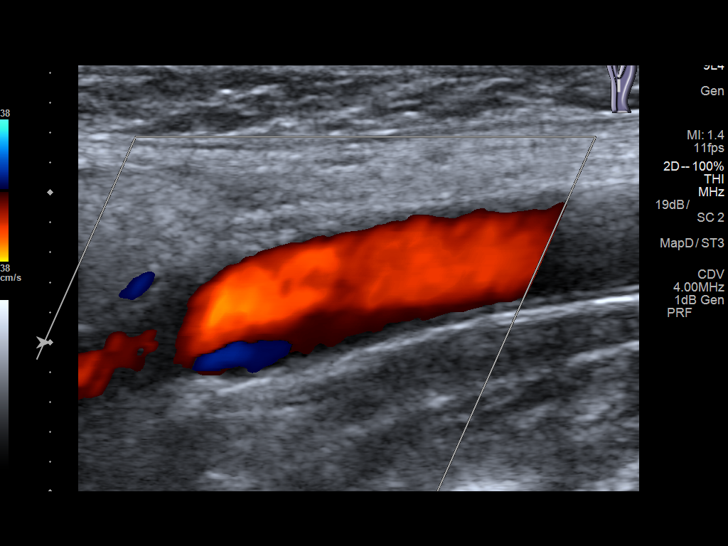
[im 53/64]
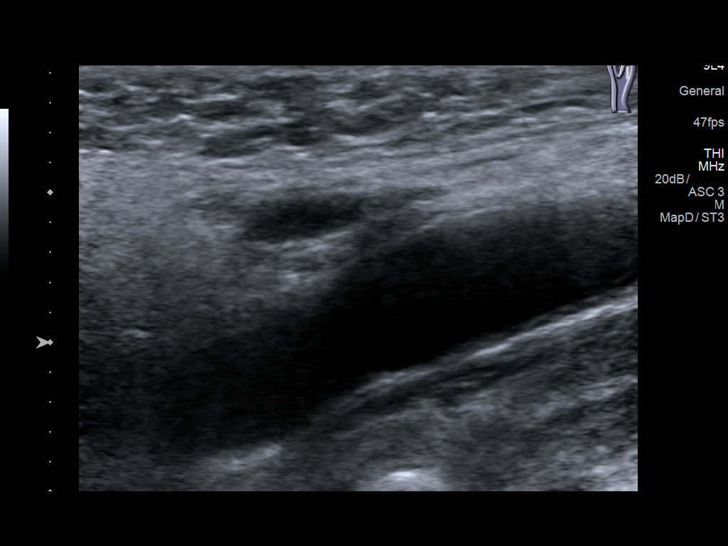
[im 58/64]
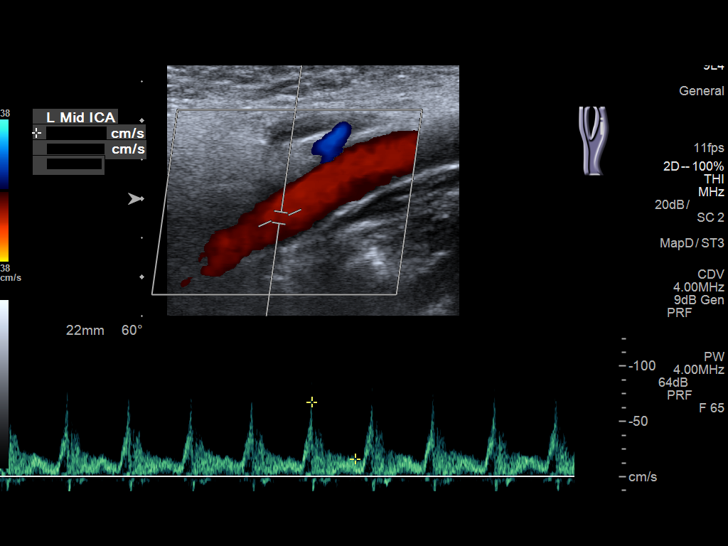
[im 64/64]
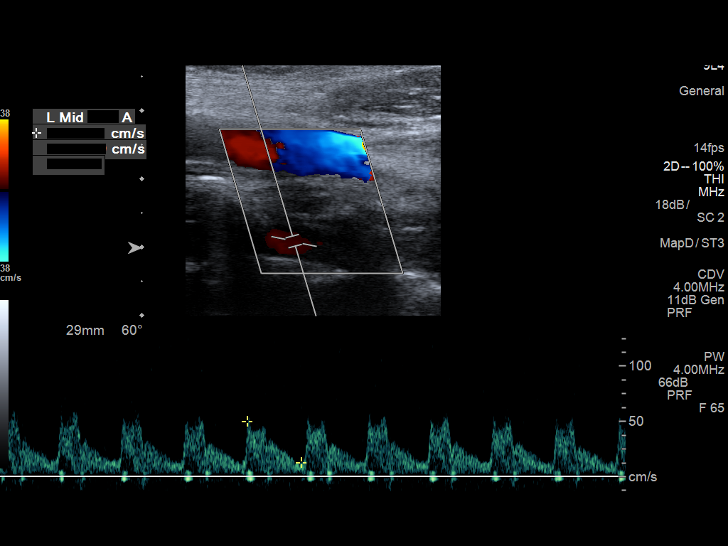

[13 of 24 positions shown; findings below may reference images not displayed]

FINDINGS: Criteria: Quantification of carotid stenosis is based on velocity
parameters that correlate the residual internal carotid diameter
with NASCET-based stenosis levels, using the diameter of the distal
internal carotid lumen as the denominator for stenosis measurement.

The following velocity measurements were obtained:

RIGHT

ICA:  77/31 cm/sec

CCA:  97/17 cm/sec

SYSTOLIC ICA/CCA RATIO:

DIASTOLIC ICA/CCA RATIO:

ECA:  91 cm/sec

LEFT

ICA:  68/16 cm/sec

CCA:  100/17 cm/sec

SYSTOLIC ICA/CCA RATIO:

DIASTOLIC ICA/CCA RATIO:

ECA:  93 cm/sec

RIGHT CAROTID ARTERY: No significant carotid atherosclerotic
vascular disease. No flow limiting stenosis.

RIGHT VERTEBRAL ARTERY:  Patent with antegrade flow.

LEFT CAROTID ARTERY: No significant carotid atherosclerotic vascular
disease.

LEFT VERTEBRAL ARTERY:  Patent with antegrade flow .
IMPRESSION: 1. No significant carotid atherosclerotic vascular disease.

2. Vertebrals are patent antegrade flow.

## 2018-10-09 IMAGING — CT CT HEAD W/O CM
3 series · 15 of 47 positions shown, 18 images · non-contrast
Comparison: 01/02/2017, 08/16/2016, MRI 09/27/2010

CLINICAL DATA: Increased confusion

EXAM:
CT HEAD WITHOUT CONTRAST
TECHNIQUE: Contiguous axial images were obtained from the base of the skull
through the vertex without intravenous contrast.

[Series 2: head wo · axial · 0.47mm/px · z∈[-126,-1]mm · 9 of 31 slices shown, 12 images]
[im 3/31  brain]
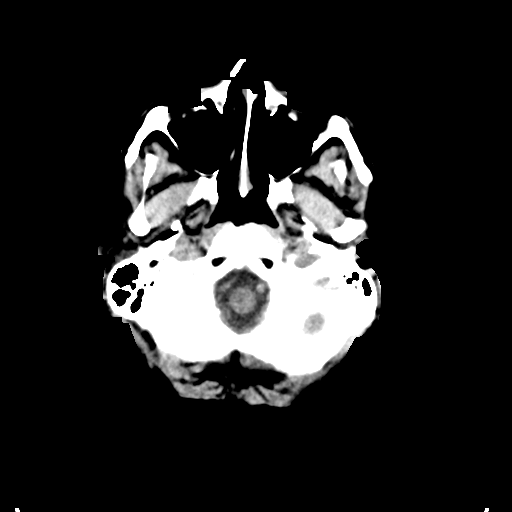
[im 3/31  bone]
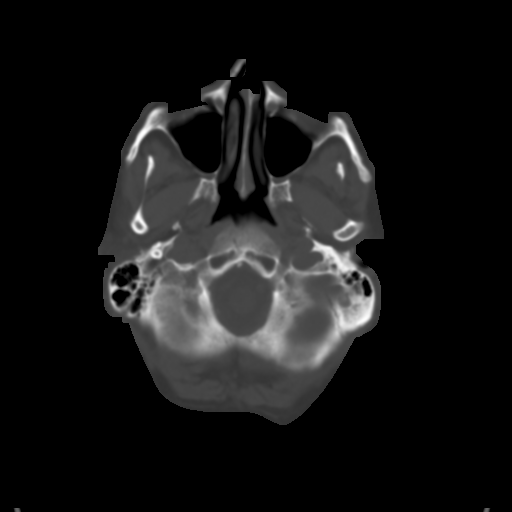
[im 6/31  brain]
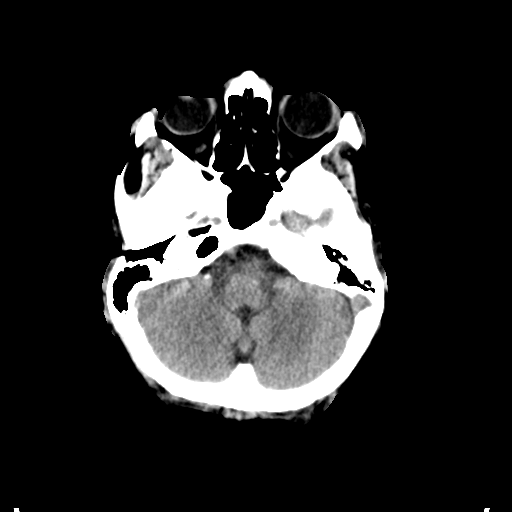
[im 9/31  brain]
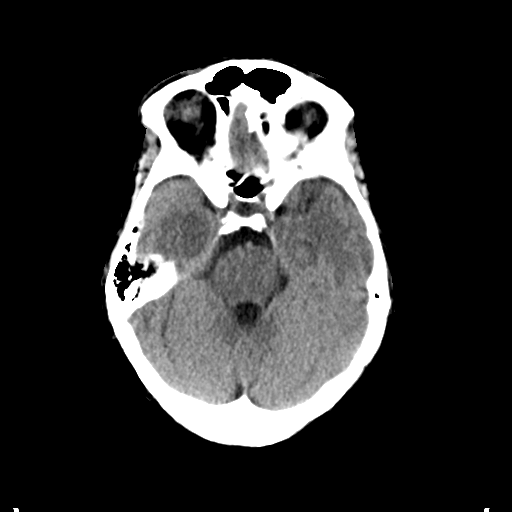
[im 12/31  brain]
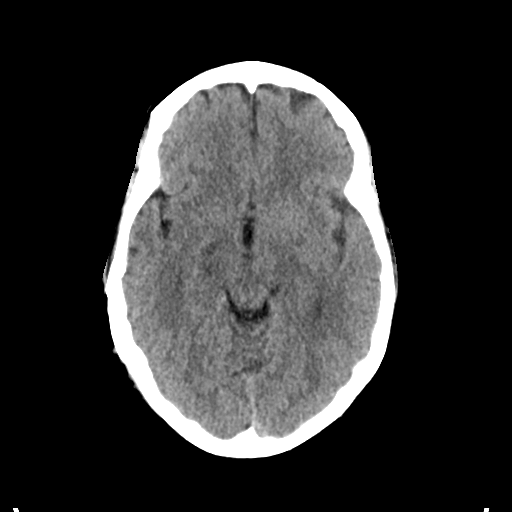
[im 16/31  brain]
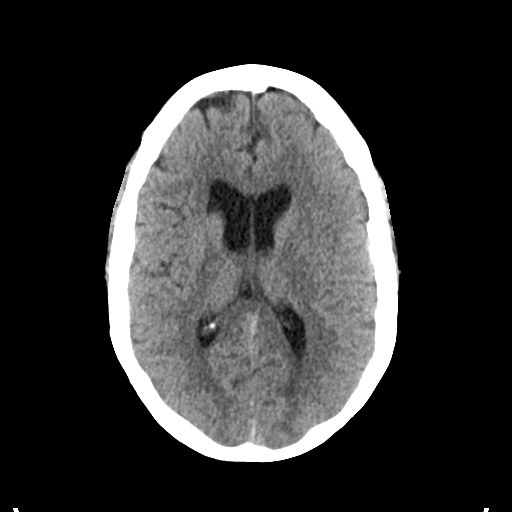
[im 16/31  bone]
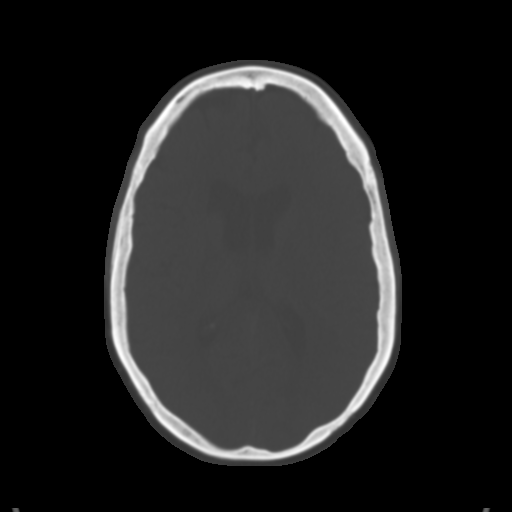
[im 19/31  brain]
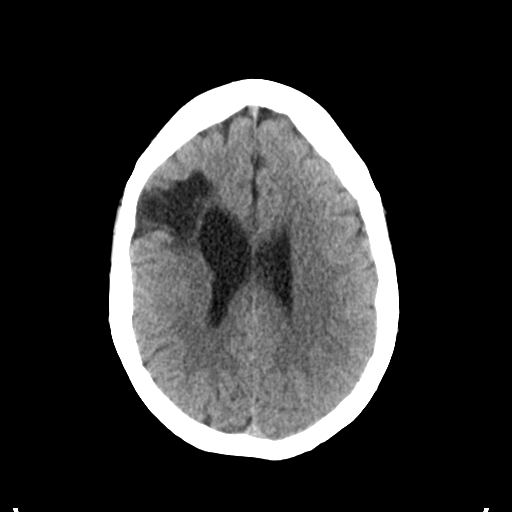
[im 22/31  brain]
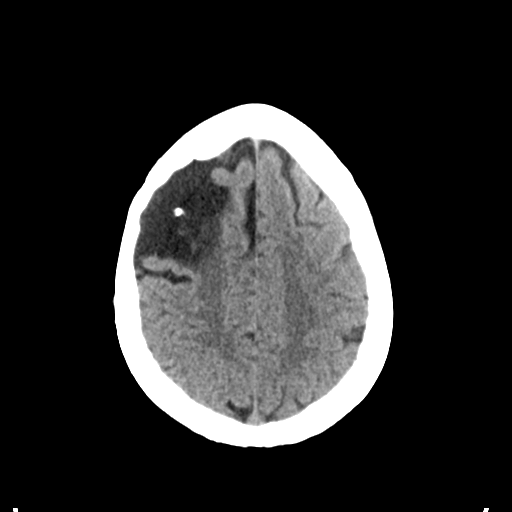
[im 25/31  brain]
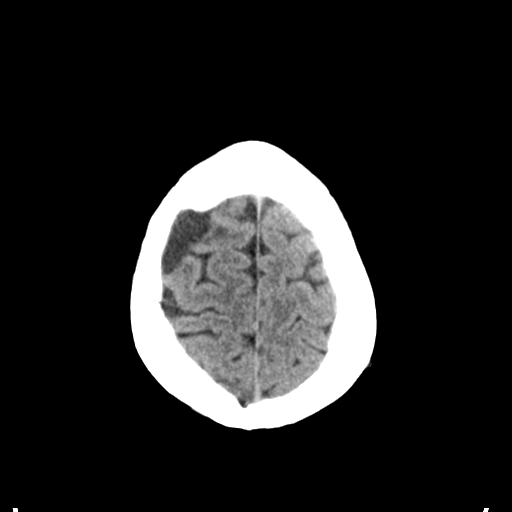
[im 28/31  brain]
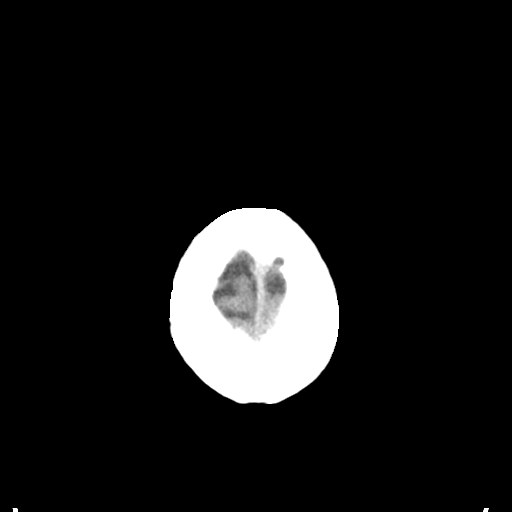
[im 28/31  bone]
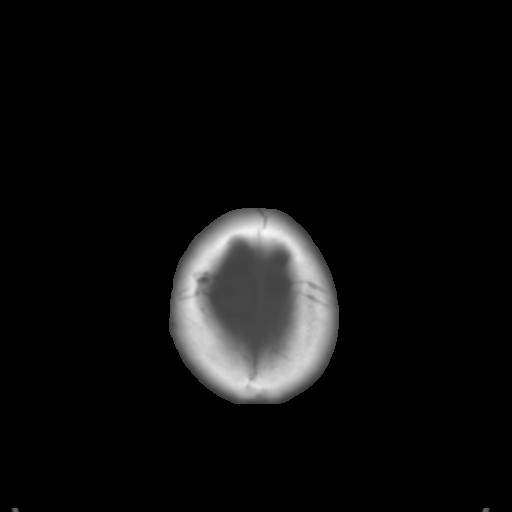

[Series 4: coronal soft tissue · coronal · 0.29mm/px · 3 of 63 slices shown]
[im 21/63  brain]
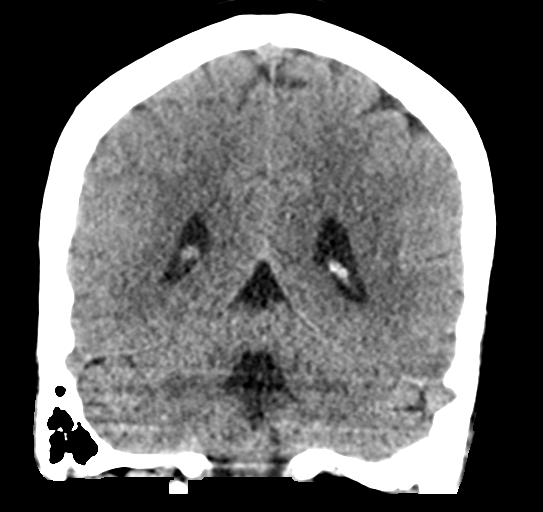
[im 28/63  brain]
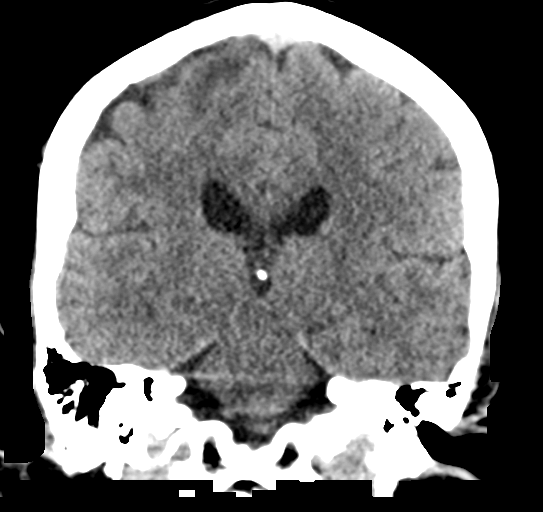
[im 35/63  brain]
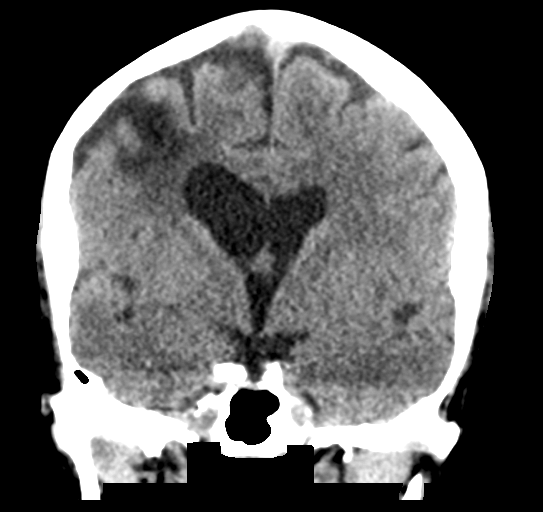

[Series 5: sagittal soft tissue · sagittal · 0.30mm/px · 3 of 47 slices shown]
[im 16/47  brain]
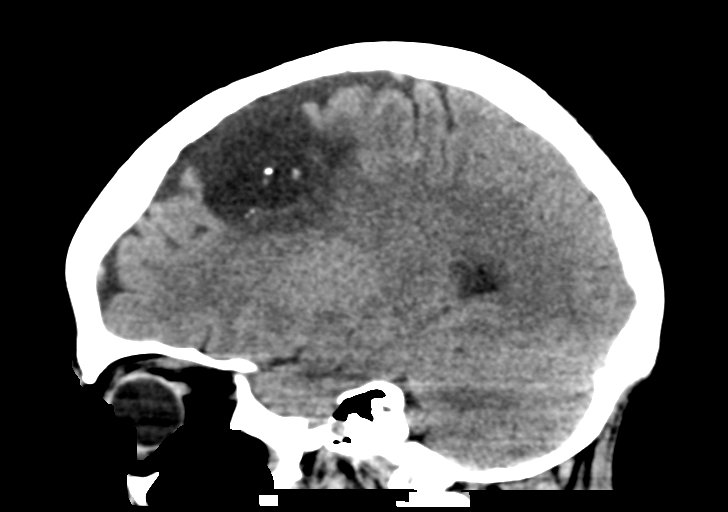
[im 24/47  brain]
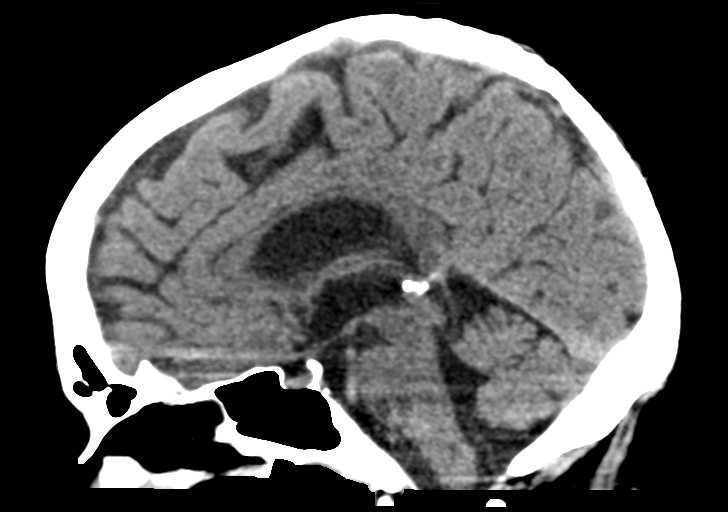
[im 31/47  brain]
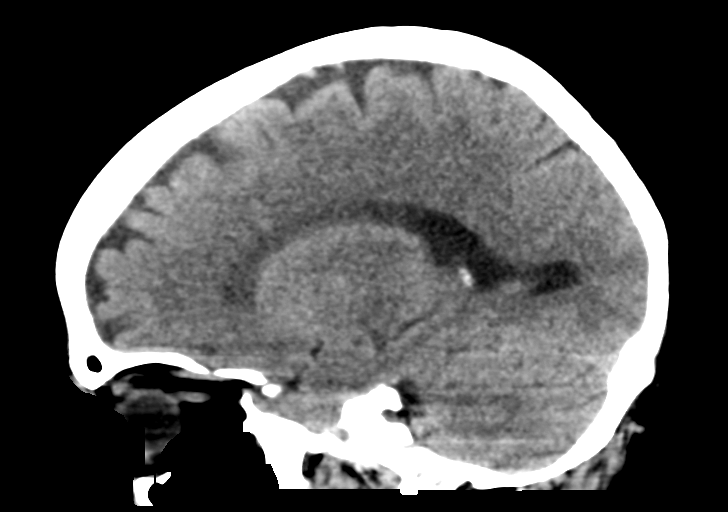

[15 of 47 positions shown; findings below may reference images not displayed]

FINDINGS: Brain: No acute territorial infarction, hemorrhage or intracranial
mass is visualized. Chronic encephalomalacia with calcification in
the right frontal lobe. Old lacunar infarct in the right basal
ganglia. Ex vacuo dilatation of the right lateral ventricle as
before.

Vascular: No hyperdense vessels. Scattered calcification at the
carotid siphon

Skull: No fracture or suspicious bone lesion

Sinuses/Orbits: No acute finding.

Other: None
IMPRESSION: 1. No CT evidence for acute intracranial abnormality.
2. Chronic encephalomalacia of the right frontal lobe

## 2018-10-31 ENCOUNTER — Other Ambulatory Visit: Payer: Self-pay | Admitting: Family

## 2018-10-31 DIAGNOSIS — F418 Other specified anxiety disorders: Secondary | ICD-10-CM

## 2018-11-01 ENCOUNTER — Emergency Department
Admission: EM | Admit: 2018-11-01 | Discharge: 2018-11-01 | Disposition: A | Discharge status: Home / Self Care | Payer: No Typology Code available for payment source | Attending: Emergency Medicine | Admitting: Emergency Medicine

## 2018-11-01 ENCOUNTER — Other Ambulatory Visit: Payer: Self-pay

## 2018-11-01 ENCOUNTER — Emergency Department: Payer: No Typology Code available for payment source

## 2018-11-01 ENCOUNTER — Encounter: Payer: Self-pay | Admitting: Emergency Medicine

## 2018-11-01 DIAGNOSIS — I10 Essential (primary) hypertension: Secondary | ICD-10-CM | POA: Insufficient documentation

## 2018-11-01 DIAGNOSIS — E039 Hypothyroidism, unspecified: Secondary | ICD-10-CM | POA: Insufficient documentation

## 2018-11-01 DIAGNOSIS — Z79899 Other long term (current) drug therapy: Secondary | ICD-10-CM | POA: Insufficient documentation

## 2018-11-01 DIAGNOSIS — E119 Type 2 diabetes mellitus without complications: Secondary | ICD-10-CM | POA: Insufficient documentation

## 2018-11-01 DIAGNOSIS — N200 Calculus of kidney: Secondary | ICD-10-CM | POA: Diagnosis not present

## 2018-11-01 DIAGNOSIS — R1084 Generalized abdominal pain: Secondary | ICD-10-CM | POA: Diagnosis present

## 2018-11-01 LAB — URINALYSIS, COMPLETE (UACMP) WITH MICROSCOPIC
BACTERIA UA: NONE SEEN
BILIRUBIN URINE: NEGATIVE
Glucose, UA: NEGATIVE mg/dL
HGB URINE DIPSTICK: NEGATIVE
Ketones, ur: NEGATIVE mg/dL
LEUKOCYTE UA: NEGATIVE
Nitrite: NEGATIVE
PROTEIN: NEGATIVE mg/dL
SPECIFIC GRAVITY, URINE: 1.024 (ref 1.005–1.030)
pH: 5 (ref 5.0–8.0)

## 2018-11-01 MED ORDER — KETOROLAC TROMETHAMINE 10 MG PO TABS: 10.0000 mg | ORAL_TABLET | Freq: Four times a day (QID) | ORAL | 0 refills | Status: DC | PRN

## 2018-11-01 MED ORDER — KETOROLAC TROMETHAMINE 60 MG/2ML IM SOLN
60.0000 mg | Freq: Once | INTRAMUSCULAR | Status: AC
  Administered 2018-11-01: 60 mg via INTRAMUSCULAR
  Filled 2018-11-01: qty 2

## 2018-11-01 MED ORDER — HYDROMORPHONE HCL 1 MG/ML IJ SOLN
1.0000 mg | Freq: Once | INTRAMUSCULAR | Status: AC
  Administered 2018-11-01: 1 mg via INTRAMUSCULAR
  Filled 2018-11-01: qty 1

## 2018-11-01 NOTE — ED Triage Notes (Signed)
Pt c/o RT flank pain that started x1hr. Denies any GU symtpoms or acute injuries. PT ambulatory, NAD noted

## 2018-11-01 NOTE — ED Notes (Signed)
See triage note  Presents with right flank pain which is moving around to right side of abd  States pain started couple of hours ago  Denies any n/v/d/ or urinary sxs'

## 2018-11-01 NOTE — Discharge Instructions (Addendum)
Advised to increase clear water intake for the next 3 to 5 days.

## 2018-11-01 NOTE — ED Provider Notes (Signed)
[facility] Emergency Department Provider Note   ____________________________________________   First MD Initiated Contact with Patient 11/01/18 1257     (approximate)  I have reviewed the triage vital signs and the nursing notes.   HISTORY  Chief Complaint Flank Pain    HPI [name] is a 53 y.o. female patient presents with right flank pain which she states is radiating to her abdomen.  This is an acute onset of the start approximate hour ago.  Patient denies provoking incident for complaint.  Patient rates the pain as a 10/10.  Patient described the pain as "sharp".   No palliative measure for complaint.  Patient advised me she has a spinal stimulator.   Past Medical History:  Diagnosis Date  . Anemia   . Anxiety and depression   . Chronic back pain   . DM2 (diabetes mellitus, type 2) (HCC)    improved since gastric bypass  . Headache    stress/sinus - 2-3x/wk  . HLD (hyperlipidemia)   . HTN (hypertension)    improved since gastric bypass  . Hypothyroid   . Left leg weakness    s/p back surgery  . Obesity   . S/P insertion of spinal cord stimulator   . Wears contact lenses     Patient Active Problem List   Diagnosis Date Noted  . TIA (transient ischemic attack) 06/10/2017  . Bariatric surgery status   . Gastrointestinal ulcer   . Iron deficiency anemia 04/21/2017  . Nonintractable headache 02/23/2017  . Narcotic overdose (HCC) 01/12/2017  . Tremor 10/29/2016  . Benign paroxysmal positional vertigo 08/21/2016  . Dizziness 05/15/2015  . Right-sided low back pain without sciatica 10/27/2014  . Hyperpigmentation 05/05/2014  . Viral wart on finger 05/05/2014  . Gluteal pain 12/28/2013  . Elevated LFTs 12/13/2013  . Screening for breast cancer 01/28/2013  . Depression with anxiety 10/26/2012  . Anemia 05/05/2012  . Hypothyroidism 10/28/2007  . HYPERCHOLESTEROLEMIA, PURE 05/21/2007    Past Surgical History:  Procedure  Laterality Date  . Abd [place] - gallstones  5/03  . BACK SURGERY  2009   decompressive laminectomy  . BARIATRIC SURGERY  03/02/2012   gastric bypass; Dr Smitty Cords  . CHOLECYSTECTOMY  6/03  . COLONOSCOPY WITH PROPOFOL N/A 05/01/2017   Procedure: COLONOSCOPY WITH PROPOFOL;  Surgeon: Midge Minium, MD;  Location: [facility];  Service: Gastroenterology;  Laterality: N/A;  . DILATION AND CURETTAGE OF UTERUS  1/05  . ESOPHAGOGASTRODUODENOSCOPY (EGD) WITH PROPOFOL N/A 05/01/2017   Procedure: ESOPHAGOGASTRODUODENOSCOPY (EGD) WITH PROPOFOL;  Surgeon: Midge Minium, MD;  Location: [facility];  Service: Gastroenterology;  Laterality: N/A;  . KNEE SURGERY     L - arthroscopic  . LIVER BIOPSY  6/03   fatty liver  . LUMBAR SPINE SURGERY  2015   Dr. Amalia Greenhouse  . NASAL SINUS SURGERY    . PANNICULECTOMY  08/2013   [facility], Dr. Diamond Nickel  . sleep study - apnea  04/2001   improved since gastric bypass    Prior to Admission medications   Medication Sig Start Date End Date Taking? Authorizing Provider  baclofen (LIORESAL) 10 MG tablet Take 10 mg by mouth daily. 07/28/16   [provider]  buPROPion (WELLBUTRIN XL) 150 MG 24 hr tablet TAKE 2 TABLETS BY MOUTH EVERY DAY 11/01/18   Allegra Grana, FNP  DULoxetine (CYMBALTA) 60 MG capsule Take 60 mg by mouth 2 (two) times daily. 07/28/16   [provider]  fentaNYL (DURAGESIC -  DOSED MCG/HR) 50 MCG/HR APPLY 1 PATCH(ES) EVERY 72 HOURS BY TRANSDERMAL ROUTE. 10/08/17   [provider]  ferrous sulfate (EQL SLOW RELEASE IRON) 160 (50 Fe) MG TBCR SR tablet Take 1 tablet (160 mg total) by mouth every other day. 02/12/17   Emily Filbert, MD  gabapentin (NEURONTIN) 400 MG capsule Take 800 mg by mouth 2 (two) times daily.  07/31/16   [provider]  ketorolac (TORADOL) 10 MG tablet Take 1 tablet (10 mg total) by mouth every 6 (six) hours as needed. 11/01/18   Joni Reining, PA-C  levothyroxine (SYNTHROID, LEVOTHROID) 100 MCG  tablet Take 1 tablet (100 mcg total) by mouth daily. 07/27/18   Allegra Grana, FNP  Melatonin 2.5 MG CHEW Chew 1 Dose by mouth at bedtime.    [provider]    Allergies Glipizide; Hydrocodone-acetaminophen; Lyrica [pregabalin]; Metformin; Penicillins; and Adhesive [tape]  Family History  Problem Relation Age of Onset  . Lung cancer Father   . Diabetes Mother   . Diabetes Sister   . Fibromyalgia Sister   . Colon cancer Maternal Aunt   . Skin cancer Paternal Aunt   . Skin cancer Paternal Uncle     Social History Social History   Tobacco Use  . Smoking status: Never Smoker  . Smokeless tobacco: Never Used  . Tobacco comment: tobacco use - no  Substance Use Topics  . Alcohol use: No  . Drug use: No    Review of Systems Constitutional: No fever/chills Eyes: No visual changes. ENT: No sore throat. Cardiovascular: Denies chest pain. Respiratory: Denies shortness of breath. Gastrointestinal: Right lateral abdominal pain.  No nausea, no vomiting.  No diarrhea.  No constipation. Genitourinary: Negative for dysuria. Musculoskeletal: Negative for back pain. Skin: Negative for rash. Neurological: Negative for headaches, focal weakness or numbness. Psychiatric:  Anxiety and depression. Endocrine:  Diabetes, hyperlipidemia, hypertension, and hypothyroidism. Allergic/Immunilogical: See allergy list. ____________________________________________   PHYSICAL EXAM:  VITAL SIGNS: ED Triage Vitals  Enc Vitals Group     BP 11/01/18 1208 (!) 188/92     Pulse Rate 11/01/18 1208 90     Resp --      Temp 11/01/18 1208 98.1 F (36.7 C)     Temp Source 11/01/18 1208 Oral     SpO2 11/01/18 1208 93 %     Weight 11/01/18 1208 210 lb (95.3 kg)     Height 11/01/18 1208 5\' 4"  (1.626 m)     Head Circumference --      Peak Flow --      Pain Score 11/01/18 1211 10     Pain Loc --      Pain Edu? --      Excl. in GC? --     Constitutional: Alert and oriented.  Moderate  distress. Hematological/Lymphatic/Immunilogical: No cervical lymphadenopathy. Cardiovascular: Normal rate, regular rhythm. Grossly normal heart sounds.  Good peripheral circulation.  Elevated blood pressure Respiratory: Normal respiratory effort.  No retractions. Lungs CTAB. Gastrointestinal: Soft and nontender. No distention. No abdominal bruits.  Right CVA tenderness. Genitourinary: Deferred Musculoskeletal: No lower extremity tenderness nor edema.  No joint effusions. Neurologic:  Normal speech and language. No gross focal neurologic deficits are appreciated. No gait instability. Skin:  Skin is warm, dry and intact. No rash noted. Psychiatric: Mood and affect are normal. Speech and behavior are normal.  ____________________________________________   LABS (all labs ordered are listed, but only abnormal results are displayed)  Labs Reviewed  URINALYSIS, COMPLETE (UACMP) WITH  MICROSCOPIC - Abnormal; Notable for the following components:      Result Value   Color, Urine YELLOW (*)    APPearance CLEAR (*)    All other components within normal limits   ____________________________________________  EKG   ____________________________________________  RADIOLOGY  ED MD interpretation:    Official radiology report(s): Ct Renal Stone Study  Result Date: 11/01/2018 CLINICAL DATA:  Right flank region pain EXAM: CT ABDOMEN AND PELVIS WITHOUT CONTRAST TECHNIQUE: Multidetector CT imaging of the abdomen and pelvis was performed following the standard protocol without oral or IV contrast. COMPARISON:  June 20, 2017 FINDINGS: Lower chest: Lung bases are clear.  There is a small hiatal hernia. Hepatobiliary: The liver measures 19.5 cm in length. There is hepatic steatosis. No focal liver lesions are appreciable on this noncontrast enhanced study. Gallbladder is absent. Pancreas: There is fatty infiltration throughout portions of the pancreas. There is no pancreatic mass or inflammatory focus. No  evident pancreatic duct dilatation. Spleen: No splenic lesions are evident. Adrenals/Urinary Tract: Adrenals bilaterally appear normal. Kidneys bilaterally show no evident mass on either side. There is slight fullness of the right renal collecting system. No evident hydronephrosis on the left. There is no intrarenal calculus on either side. There is no appreciable ureteral calculus on either side. Several phleboliths are seen near but separate from the distal ureters. Urinary bladder is midline with wall thickness within normal limits. Stomach/Bowel: The patient is status post gastric bypass procedure. There is no wall thickening or fluid in the perioperative regions. Elsewhere, there is no appreciable bowel wall or mesenteric thickening. There is no evident bowel obstruction. No free air or portal venous air. Vascular/Lymphatic: There is no demonstrable abdominal aortic aneurysm. No vascular lesions are evident on this noncontrast enhanced study. There is no evident adenopathy in the abdomen or pelvis. Reproductive: The uterus is anteverted. No pelvic masses are evident. Other: The appendix appears normal. There is no evident abscess or ascites in the abdomen or pelvis. There is scarring in the anterior abdominal wall. Musculoskeletal: Stimulator is position in the posterior right abdominal wall within the soft tissues. The lead from this stimulator extends into the lower thoracic region with tip not seen. There is soft tissue thickening with foci of air in the posterior right pelvic wall, likely due to injections in this area. There is postoperative change at L4 and L5. There is degenerative change in the lumbar spine. There are no blastic or lytic bone lesions. There are no intramuscular lesions. IMPRESSION: 1. Slight fullness of the right renal collecting system without ureteral calculus. Question recent calculus passage on the right. Pyelonephritis could present in this manner and is a differential  consideration. Correlation with urinalysis advised in this regard. No evident right renal perinephric stranding or abnormal fluid. 2.  Prominent liver with hepatic steatosis.  Absent gallbladder. 3. No evident bowel obstruction. Status post gastric bypass procedure without complicating features by CT. No abscess in the abdomen or pelvis. Appendix appears normal. 4.  Small hiatal hernia. 5. Postoperative change in the lumbar spine. Thoracic stimulator noted. Electronically Signed   By: Bretta BangWilliam  Woodruff III M.D.   On: 11/01/2018 14:42    ____________________________________________   PROCEDURES  Procedure(s) performed: None  Procedures  Critical Care performed: No  ____________________________________________   INITIAL IMPRESSION / ASSESSMENT AND PLAN / ED COURSE  As part of my medical decision making, I reviewed the following data within the electronic MEDICAL RECORD NUMBER     Patient presents with right flank  and abdominal pain with acute onset prior to arrival.  CT scan is consistent with passing of stone.  Patient given discharge care instructions and advised take medication as needed for pain.  Follow-up with family doctor.      ____________________________________________   FINAL CLINICAL IMPRESSION(S) / ED DIAGNOSES  Final diagnoses:  Kidney stone     ED Discharge Orders         Ordered    ketorolac (TORADOL) 10 MG tablet  Every 6 hours PRN     11/01/18 1448           Note:  This document was prepared using Dragon voice recognition software and may include unintentional dictation errors.    Joni Reining, PA-C 11/01/18 1451    Arnaldo Natal, MD 11/01/18 [phone]

## 2018-11-23 ENCOUNTER — Other Ambulatory Visit: Payer: Self-pay | Admitting: Family

## 2018-11-23 DIAGNOSIS — F418 Other specified anxiety disorders: Secondary | ICD-10-CM

## 2018-12-12 ENCOUNTER — Encounter: Payer: Self-pay | Admitting: Emergency Medicine

## 2018-12-12 ENCOUNTER — Emergency Department: Payer: PRIVATE HEALTH INSURANCE

## 2018-12-12 ENCOUNTER — Other Ambulatory Visit: Payer: Self-pay

## 2018-12-12 ENCOUNTER — Emergency Department
Admission: EM | Admit: 2018-12-12 | Discharge: 2018-12-12 | Disposition: A | Discharge status: Home / Self Care | Payer: PRIVATE HEALTH INSURANCE | Attending: Emergency Medicine | Admitting: Emergency Medicine

## 2018-12-12 DIAGNOSIS — Y939 Activity, unspecified: Secondary | ICD-10-CM | POA: Insufficient documentation

## 2018-12-12 DIAGNOSIS — Y999 Unspecified external cause status: Secondary | ICD-10-CM | POA: Diagnosis not present

## 2018-12-12 DIAGNOSIS — S6992XA Unspecified injury of left wrist, hand and finger(s), initial encounter: Secondary | ICD-10-CM | POA: Diagnosis present

## 2018-12-12 DIAGNOSIS — Z79899 Other long term (current) drug therapy: Secondary | ICD-10-CM | POA: Insufficient documentation

## 2018-12-12 DIAGNOSIS — W19XXXA Unspecified fall, initial encounter: Secondary | ICD-10-CM

## 2018-12-12 DIAGNOSIS — I10 Essential (primary) hypertension: Secondary | ICD-10-CM | POA: Insufficient documentation

## 2018-12-12 DIAGNOSIS — S63502A Unspecified sprain of left wrist, initial encounter: Secondary | ICD-10-CM

## 2018-12-12 DIAGNOSIS — S6392XA Sprain of unspecified part of left wrist and hand, initial encounter: Secondary | ICD-10-CM | POA: Insufficient documentation

## 2018-12-12 DIAGNOSIS — E039 Hypothyroidism, unspecified: Secondary | ICD-10-CM | POA: Insufficient documentation

## 2018-12-12 DIAGNOSIS — Y929 Unspecified place or not applicable: Secondary | ICD-10-CM | POA: Insufficient documentation

## 2018-12-12 DIAGNOSIS — E119 Type 2 diabetes mellitus without complications: Secondary | ICD-10-CM | POA: Diagnosis not present

## 2018-12-12 DIAGNOSIS — S60222A Contusion of left hand, initial encounter: Secondary | ICD-10-CM | POA: Diagnosis not present

## 2018-12-12 DIAGNOSIS — W010XXA Fall on same level from slipping, tripping and stumbling without subsequent striking against object, initial encounter: Secondary | ICD-10-CM | POA: Insufficient documentation

## 2018-12-12 DIAGNOSIS — Y92009 Unspecified place in unspecified non-institutional (private) residence as the place of occurrence of the external cause: Secondary | ICD-10-CM

## 2018-12-12 NOTE — ED Triage Notes (Signed)
Pt presents to ED via POV with c/o L hand pain. Pt states fell last night and put her hand out to catch herself and states that she hurt her L hand.

## 2018-12-12 NOTE — Discharge Instructions (Signed)
Follow-up with your primary care provider if any continued problems or Dr. Joice Lofts who is on-call for orthopedics at [facility] if any worsening or not improving for a follow-up appointment.  Wear the Velcro splint for the next 4 to 5 days for support and protection.  Ice and elevation.  you may also take ibuprofen 3 tablets with food 3 times a day for inflammation.

## 2018-12-12 NOTE — ED Provider Notes (Signed)
[facility] Emergency Department Provider Note  ____________________________________________   First MD Initiated Contact with Patient 12/12/18 1512     (approximate)  I have reviewed the triage vital signs and the nursing notes.   HISTORY  Chief Complaint Hand Pain   HPI [name] is a 53 y.o. female presents to the ED with complaint of left hand pain.  Patient states that she fell last night putting her left hand out to catch herself from trying to fall.  Patient denies any head injury or loss of consciousness.  She is continued to have left hand pain today which is increased with any type of range of motion.  She rates her pain as a 10/10 at this time.     Past Medical History:  Diagnosis Date  . Anemia   . Anxiety and depression   . Chronic back pain   . DM2 (diabetes mellitus, type 2) (HCC)    improved since gastric bypass  . Headache    stress/sinus - 2-3x/wk  . HLD (hyperlipidemia)   . HTN (hypertension)    improved since gastric bypass  . Hypothyroid   . Left leg weakness    s/p back surgery  . Obesity   . S/P insertion of spinal cord stimulator   . Wears contact lenses     Patient Active Problem List   Diagnosis Date Noted  . TIA (transient ischemic attack) 06/10/2017  . Bariatric surgery status   . Gastrointestinal ulcer   . Iron deficiency anemia 04/21/2017  . Nonintractable headache 02/23/2017  . Narcotic overdose (HCC) 01/12/2017  . Tremor 10/29/2016  . Benign paroxysmal positional vertigo 08/21/2016  . Dizziness 05/15/2015  . Right-sided low back pain without sciatica 10/27/2014  . Hyperpigmentation 05/05/2014  . Viral wart on finger 05/05/2014  . Gluteal pain 12/28/2013  . Elevated LFTs 12/13/2013  . Screening for breast cancer 01/28/2013  . Depression with anxiety 10/26/2012  . Anemia 05/05/2012  . Hypothyroidism 10/28/2007  . HYPERCHOLESTEROLEMIA, PURE 05/21/2007    Past Surgical History:  Procedure  Laterality Date  . Abd [place] - gallstones  5/03  . BACK SURGERY  2009   decompressive laminectomy  . BARIATRIC SURGERY  03/02/2012   gastric bypass; Dr Smitty Cords  . CHOLECYSTECTOMY  6/03  . COLONOSCOPY WITH PROPOFOL N/A 05/01/2017   Procedure: COLONOSCOPY WITH PROPOFOL;  Surgeon: Midge Minium, MD;  Location: [facility];  Service: Gastroenterology;  Laterality: N/A;  . DILATION AND CURETTAGE OF UTERUS  1/05  . ESOPHAGOGASTRODUODENOSCOPY (EGD) WITH PROPOFOL N/A 05/01/2017   Procedure: ESOPHAGOGASTRODUODENOSCOPY (EGD) WITH PROPOFOL;  Surgeon: Midge Minium, MD;  Location: [facility];  Service: Gastroenterology;  Laterality: N/A;  . KNEE SURGERY     L - arthroscopic  . LIVER BIOPSY  6/03   fatty liver  . LUMBAR SPINE SURGERY  2015   Dr. Amalia Greenhouse  . NASAL SINUS SURGERY    . PANNICULECTOMY  08/2013   [facility], Dr. Diamond Nickel  . sleep study - apnea  04/2001   improved since gastric bypass    Prior to Admission medications   Medication Sig Start Date End Date Taking? Authorizing Provider  baclofen (LIORESAL) 10 MG tablet Take 10 mg by mouth daily. 07/28/16   [provider]  buPROPion (WELLBUTRIN XL) 150 MG 24 hr tablet TAKE 2 TABLETS BY MOUTH EVERY DAY 11/23/18   Allegra Grana, FNP  DULoxetine (CYMBALTA) 60 MG capsule Take 60 mg by mouth 2 (two) times daily. 07/28/16  [provider]  fentaNYL (DURAGESIC - DOSED MCG/HR) 50 MCG/HR APPLY 1 PATCH(ES) EVERY 72 HOURS BY TRANSDERMAL ROUTE. 10/08/17   [provider]  ferrous sulfate (EQL SLOW RELEASE IRON) 160 (50 Fe) MG TBCR SR tablet Take 1 tablet (160 mg total) by mouth every other day. 02/12/17   Emily Filbert, MD  gabapentin (NEURONTIN) 400 MG capsule Take 800 mg by mouth 2 (two) times daily.  07/31/16   [provider]  levothyroxine (SYNTHROID, LEVOTHROID) 100 MCG tablet Take 1 tablet (100 mcg total) by mouth daily. 07/27/18   Allegra Grana, FNP  Melatonin 2.5 MG CHEW Chew 1 Dose by mouth  at bedtime.    [provider]    Allergies Glipizide; Hydrocodone-acetaminophen; Lyrica [pregabalin]; Metformin; Penicillins; and Adhesive [tape]  Family History  Problem Relation Age of Onset  . Lung cancer Father   . Diabetes Mother   . Diabetes Sister   . Fibromyalgia Sister   . Colon cancer Maternal Aunt   . Skin cancer Paternal Aunt   . Skin cancer Paternal Uncle     Social History Social History   Tobacco Use  . Smoking status: Never Smoker  . Smokeless tobacco: Never Used  . Tobacco comment: tobacco use - no  Substance Use Topics  . Alcohol use: No  . Drug use: No    Review of Systems Constitutional: No fever/chills Cardiovascular: Denies chest pain. Respiratory: Denies shortness of breath. Musculoskeletal: Positive for left hand and wrist pain. Skin: Negative for injury. Neurological: Negative for headaches, focal weakness or numbness. ___________________________________________   PHYSICAL EXAM:  VITAL SIGNS: ED Triage Vitals  Enc Vitals Group     BP 12/12/18 1402 (!) 151/80     Pulse Rate 12/12/18 1402 100     Resp --      Temp 12/12/18 1402 98.5 F (36.9 C)     Temp Source 12/12/18 1402 Oral     SpO2 12/12/18 1402 96 %     Weight 12/12/18 1357 220 lb (99.8 kg)     Height 12/12/18 1357  (1.651 m)     Head Circumference --      Peak Flow --      Pain Score 12/12/18 1357 10     Pain Loc --      Pain Edu? --      Excl. in GC? --    Constitutional: Alert and oriented. Well appearing and in no acute distress. Eyes: Conjunctivae are normal.  Head: Atraumatic. Neck: No stridor.   Cardiovascular: Normal rate, regular rhythm. Grossly normal heart sounds.  Good peripheral circulation. Respiratory: Normal respiratory effort.  No retractions. Lungs CTAB. Musculoskeletal: Examination of the left hand and wrist there is no gross deformity.  Range of motion is restricted secondary to discomfort.  No ecchymosis or abrasions were seen.  Patient  is able to move digits distally without any difficulty and motor sensory function intact.  Capillary refill is less than 3 seconds.  Patient is able to flex and extend and the ulnar aspect of the wrist is tender to light palpation. Neurologic:  Normal speech and language. No gross focal neurologic deficits are appreciated.  Skin:  Skin is warm, dry and intact.  Psychiatric: Mood and affect are normal. Speech and behavior are normal.  ____________________________________________   LABS (all labs ordered are listed, but only abnormal results are displayed)  Labs Reviewed - No data to display  RADIOLOGY  Official radiology report(s): Dg Hand Complete Left  Result Date: 12/12/2018 CLINICAL DATA:  Fall, subsequent pain in hand. EXAM: LEFT HAND - COMPLETE 3+ VIEW COMPARISON:  11/10/2013 FINDINGS: Faint linear lucency in ridging in the midshaft of the small finger metacarpal was probably present on 11/10/2013 this likely incidental. There is progressive somewhat amorphous calcification along the volar-radial side of the small finger MCP joint measuring about 0.6 by 0.3 cm on the oblique projection. IMPRESSION: 1. No acute fracture is identified. 2. Amorphous calcification along the volar-radial side of the fifth MTP joint, new or increased from 11/10/2013. Such periarticular soft tissue calcification has a wide differential diagnosis of well over 20 possibilities, although heterotopic ossification from a remote prior injury may be most likely. Electronically Signed   By: Gaylyn Rong M.D.   On: 12/12/2018 14:40    ____________________________________________   PROCEDURES  Procedure(s) performed (including Critical Care):  Procedures  Was placed in a Velcro cock-up wrist splint by ED tech. ____________________________________________   INITIAL IMPRESSION / ASSESSMENT AND PLAN / ED COURSE  As part of my medical decision making, I reviewed the following data within the electronic  MEDICAL RECORD NUMBER Notes from prior ED visits and [place] Controlled Substance Database  Patient presents to the ED with complaint of left hand and wrist pain.  Patient states that she fell last evening trying to catch herself with her left hand.  She denies any head injury or loss of consciousness.  On exam there is moderate tenderness on palpation of the left hand and wrist without high suspicion for a fracture.  Patient is able to flex and extend but guards range of motion secondary to increased pain.  X-rays were reassuring and patient was made aware.  Patient was placed in a cock-up wrist splint and told to ice and elevate as needed for pain and swelling.  She was also instructed to take ibuprofen for inflammation.  Patient currently is using a fentanyl patch for chronic pain and will continue doing that as well.  ____________________________________________   FINAL CLINICAL IMPRESSION(S) / ED DIAGNOSES  Final diagnoses:  Contusion of left hand, initial encounter  Sprain of left wrist, initial encounter  Fall at home, initial encounter     ED Discharge Orders    None       Note:  This document was prepared using Dragon voice recognition software and may include unintentional dictation errors.    Tommi Rumps, PA-C 12/12/18 1636    Dionne Bucy, MD 12/13/18 1104

## 2018-12-15 ENCOUNTER — Telehealth: Payer: Self-pay | Admitting: Family

## 2018-12-15 NOTE — Telephone Encounter (Signed)
Please call patient I can see that she was seen emergency room for left hand in  Please offer that leaving  Emergency room follow-ups virtually.  Also have not seen her since last February and would certainly like to check in.

## 2018-12-15 NOTE — Telephone Encounter (Signed)
LMTCB asking patient if she was interested in f/u visit virtually after recent ED visit.

## 2018-12-16 NOTE — Telephone Encounter (Signed)
Yes pt scheduled and appointment on 12/20/18. Please advise

## 2018-12-17 NOTE — Telephone Encounter (Signed)
I have called patient & gotten email to send invitation for virtual visit.

## 2018-12-20 ENCOUNTER — Encounter: Payer: Self-pay | Admitting: Family

## 2018-12-20 ENCOUNTER — Ambulatory Visit (INDEPENDENT_AMBULATORY_CARE_PROVIDER_SITE_OTHER): Payer: PRIVATE HEALTH INSURANCE | Admitting: Family

## 2018-12-20 DIAGNOSIS — E039 Hypothyroidism, unspecified: Secondary | ICD-10-CM | POA: Diagnosis not present

## 2018-12-20 DIAGNOSIS — J40 Bronchitis, not specified as acute or chronic: Secondary | ICD-10-CM

## 2018-12-20 DIAGNOSIS — F418 Other specified anxiety disorders: Secondary | ICD-10-CM

## 2018-12-20 MED ORDER — BUPROPION HCL ER (XL) 300 MG PO TB24: 300.0000 mg | ORAL_TABLET | Freq: Every day | ORAL | 1 refills | Status: DC

## 2018-12-20 MED ORDER — AZITHROMYCIN 250 MG PO TABS: ORAL_TABLET | ORAL | 0 refills | Status: DC

## 2018-12-20 MED ORDER — BENZONATATE 100 MG PO CAPS: 100.0000 mg | ORAL_CAPSULE | Freq: Two times a day (BID) | ORAL | 0 refills | Status: DC | PRN

## 2018-12-20 MED ORDER — ALBUTEROL SULFATE HFA 108 (90 BASE) MCG/ACT IN AERS: 2.0000 | INHALATION_SPRAY | Freq: Four times a day (QID) | RESPIRATORY_TRACT | 0 refills | Status: DC | PRN

## 2018-12-20 MED ORDER — LEVOTHYROXINE SODIUM 100 MCG PO TABS: 100.0000 ug | ORAL_TABLET | Freq: Every day | ORAL | 1 refills | Status: DC

## 2018-12-20 NOTE — Progress Notes (Signed)
This visit type was conducted due to national recommendations for restrictions regarding the COVID-19 pandemic (e.g. social distancing).  This format is felt to be most appropriate for this patient at this time.  All issues noted in this document were discussed and addressed.  No physical exam was performed (except for noted visual exam findings with Video Visits). Virtual Visit via Video Note  I connected with@ on 12/20/18 at  9:00 AM EDT by a video enabled telemedicine application and verified that I am speaking with the correct person using two identifiers.  Location patient: home Location provider:work  Persons participating in the virtual visit: patient, provider, patient's partner ( robin)  I discussed the limitations of evaluation and management by telemedicine and the availability of in person appointments. The patient expressed understanding and agreed to proceed.   HPI: Partner is present. Complains of chest congestion, 3 weeks. Productive cough, cough during day and night.  Endorses hoarseness. No facial pain,  SOB. Occasional wheeze.  On mucinex with some relief. No recent antibiotics.   Had flu which is 'gone'. Seen at urgent care. Fever has resolved.  No tamiflu.   Never smoker.   Seen in the emergency room 7 days ago for contusion to the left hand after falling on outstretched hand. XR showed calfication . Feels 'fine'; worn brace for 5 days. Good ROM. No swelling.   Following with Dr Dustin Folksalman. Fentanyl patch, cymbalta.  Doing well on this regimen.  Hypothyroidism- compliant with synthroid.   Depression- doing well on wellbutrin. Doing well on wellbutrin. Sleeping well.   No thoughts of hurting herself or anyone else  Anemia resolved. No longer following with Dr Smith Robertao.  Due for CPE  ROS: See pertinent positives and negatives per HPI.  Past Medical History:  Diagnosis Date  . Anemia   . Anxiety and depression   . Chronic back pain   . DM2 (diabetes mellitus, type 2)  (HCC)    improved since gastric bypass  . Headache    stress/sinus - 2-3x/wk  . HLD (hyperlipidemia)   . HTN (hypertension)    improved since gastric bypass  . Hypothyroid   . Left leg weakness    s/p back surgery  . Obesity   . S/P insertion of spinal cord stimulator   . Wears contact lenses     Past Surgical History:  Procedure Laterality Date  . Abd [place] - gallstones  5/03  . BACK SURGERY  2009   decompressive laminectomy  . BARIATRIC SURGERY  03/02/2012   gastric bypass; Dr Smitty CordsBruce  . CHOLECYSTECTOMY  6/03  . COLONOSCOPY WITH PROPOFOL N/A 05/01/2017   Procedure: COLONOSCOPY WITH PROPOFOL;  Surgeon: Midge MiniumWohl, Darren, MD;  [facility];  Service: Gastroenterology;  Laterality: N/A;  . DILATION AND CURETTAGE OF UTERUS  1/05  . ESOPHAGOGASTRODUODENOSCOPY (EGD) WITH PROPOFOL N/A 05/01/2017   Procedure: ESOPHAGOGASTRODUODENOSCOPY (EGD) WITH PROPOFOL;  Surgeon: Midge MiniumWohl, Darren, MD;  [facility];  Service: Gastroenterology;  Laterality: N/A;  . KNEE SURGERY     L - arthroscopic  . LIVER BIOPSY  6/03   fatty liver  . LUMBAR SPINE SURGERY  2015   Dr. Amalia Greenhouseehmig  . NASAL SINUS SURGERY    . PANNICULECTOMY  08/2013   [facility], Dr. Diamond NickelErdman  . sleep study - apnea  04/2001   improved since gastric bypass    Family History  Problem Relation Age of Onset  . Lung cancer Father   . Diabetes Mother   . Diabetes Sister   .  Fibromyalgia Sister   . Colon cancer Maternal Aunt   . Skin cancer Paternal Aunt   . Skin cancer Paternal Uncle     SOCIAL HX: Never smoker   Current Outpatient Medications:  .  baclofen (LIORESAL) 10 MG tablet, Take 10 mg by mouth daily., Disp: , Rfl:  .  buPROPion (WELLBUTRIN XL) 300 MG 24 hr tablet, Take 1 tablet (300 mg total) by mouth daily., Disp: 90 tablet, Rfl: 1 .  DULoxetine (CYMBALTA) 60 MG capsule, Take 60 mg by mouth 2 (two) times daily., Disp: , Rfl:  .  fentaNYL (DURAGESIC - DOSED MCG/HR) 50 MCG/HR, APPLY 1 PATCH(ES) EVERY 72  HOURS BY TRANSDERMAL ROUTE., Disp: , Rfl: 0 .  gabapentin (NEURONTIN) 400 MG capsule, Take 800 mg by mouth 2 (two) times daily. , Disp: , Rfl:  .  levothyroxine (SYNTHROID, LEVOTHROID) 100 MCG tablet, Take 1 tablet (100 mcg total) by mouth daily., Disp: 90 tablet, Rfl: 1 .  albuterol (PROVENTIL HFA;VENTOLIN HFA) 108 (90 Base) MCG/ACT inhaler, Inhale 2 puffs into the lungs every 6 (six) hours as needed for wheezing or shortness of breath., Disp: 1 Inhaler, Rfl: 0 .  azithromycin (ZITHROMAX) 250 MG tablet, Take 2 tablets ( total 500 mg) PO on day 1, then take 1 tablet ( total 250 mg) by mouth q24h x 4 days., Disp: 6 tablet, Rfl: 0 .  benzonatate (TESSALON) 100 MG capsule, Take 1 capsule (100 mg total) by mouth 2 (two) times daily as needed for cough., Disp: 30 capsule, Rfl: 0  EXAM:  VITALS per patient if applicable:  GENERAL: alert, oriented, appears well and in no acute distress  HEENT: atraumatic, conjunttiva clear, no obvious abnormalities on inspection of external nose and ears  NECK: normal movements of the head and neck  LUNGS: on inspection no signs of respiratory distress, breathing rate appears normal, no obvious gross SOB, gasping or wheezing  CV: no obvious cyanosis  MS: moves all visible extremities without noticeable abnormality  PSYCH/NEURO: pleasant and cooperative, no obvious depression or anxiety, speech and thought processing grossly intact  ASSESSMENT AND PLAN:  Discussed the following assessment and plan:  Bronchitis - Plan: azithromycin (ZITHROMAX) 250 MG tablet, benzonatate (TESSALON) 100 MG capsule, albuterol (PROVENTIL HFA;VENTOLIN HFA) 108 (90 Base) MCG/ACT inhaler  Depression with anxiety - Plan: buPROPion (WELLBUTRIN XL) 300 MG 24 hr tablet  Hypothyroidism, unspecified type - Plan: levothyroxine (SYNTHROID, LEVOTHROID) 100 MCG tablet  Problem List Items Addressed This Visit      Respiratory   Bronchitis - Primary    Patient is well-appearing on WebEx.   No acute respiratory distress appreciated.  She is not labored in her speech and speaking in full sentences.  She is afebrile.  Based on duration of symptoms, suspect possible secondary bacterial infection.  Will start antibiotics.  Advised patient current setting up pandemic, but she must stay at home and essentially quarantine until she is without symptoms for at least 3 days.  She will start albuterol, azithromycin, Tessalon Perles as needed.  Patient verbalized understanding of all.  She will let me know how she is doing.      Relevant Medications   azithromycin (ZITHROMAX) 250 MG tablet   benzonatate (TESSALON) 100 MG capsule   albuterol (PROVENTIL HFA;VENTOLIN HFA) 108 (90 Base) MCG/ACT inhaler     Endocrine   Hypothyroidism    Compliant medication, will return for follow-up including CPE in which we will check TSH at that time.      Relevant Medications  levothyroxine (SYNTHROID, LEVOTHROID) 100 MCG tablet     Other   Depression with anxiety    Doing well on current Wellbutrin, will continue at this time.      Relevant Medications   buPROPion (WELLBUTRIN XL) 300 MG 24 hr tablet     Note: Emphasized the importance of patient coming back for physical exam including Pap.  She is very overdue on a number of health maintenance items.  She verbalized understanding of this.  She will call make an appointment.   I discussed the assessment and treatment plan with the patient. The patient was provided an opportunity to ask questions and all were answered. The patient agreed with the plan and demonstrated an understanding of the instructions.   The patient was advised to call back or seek an in-person evaluation if the symptoms worsen or if the condition fails to improve as anticipated.    Rennie Plowman, FNP

## 2018-12-20 NOTE — Assessment & Plan Note (Addendum)
Patient is well-appearing on WebEx.  No acute respiratory distress appreciated.  She is not labored in her speech and speaking in full sentences.  She is afebrile.  Based on duration of symptoms, suspect possible secondary bacterial infection.  Will start antibiotics.  Advised patient current setting up pandemic, but she must stay at home and essentially quarantine until she is without symptoms for at least 3 days.  She will start albuterol, azithromycin, Tessalon Perles as needed.  Patient verbalized understanding of all.  She will let me know how she is doing.

## 2018-12-20 NOTE — Patient Instructions (Signed)
Pleasure seeing you today via WebEx. I have sent in azithromycin ( antibiotic) for you to start . Ensure to take probiotics while on antibiotics and also for 2 weeks after completion. It is important to re-colonize the gut with good bacteria and also to prevent any diarrheal infections associated with antibiotic use.   Use albuterol every 6 hours for first 24 hours to get good medication into the lungs and loosen congestion; after, you may use as needed and eventually stop all together when cough resolves.  May use Tessalon Perles as needed.  As discussed this morning, you are very overdue for your physical, mammogram, Pap smear, labs.  Please please make an appointment in May so we can do all of this and get you up-to-date.  I will look forward to seeing you.

## 2018-12-20 NOTE — Assessment & Plan Note (Signed)
Doing well on current Wellbutrin, will continue at this time.

## 2018-12-20 NOTE — Assessment & Plan Note (Signed)
Compliant medication, will return for follow-up including CPE in which we will check TSH at that time.

## 2019-01-02 ENCOUNTER — Emergency Department: Payer: PRIVATE HEALTH INSURANCE

## 2019-01-02 ENCOUNTER — Emergency Department
Admission: EM | Admit: 2019-01-02 | Discharge: 2019-01-03 | Disposition: A | Discharge status: Home / Self Care | Payer: PRIVATE HEALTH INSURANCE | Attending: Emergency Medicine | Admitting: Emergency Medicine

## 2019-01-02 ENCOUNTER — Other Ambulatory Visit: Payer: Self-pay

## 2019-01-02 ENCOUNTER — Encounter: Payer: Self-pay | Admitting: Emergency Medicine

## 2019-01-02 DIAGNOSIS — I10 Essential (primary) hypertension: Secondary | ICD-10-CM | POA: Insufficient documentation

## 2019-01-02 DIAGNOSIS — E119 Type 2 diabetes mellitus without complications: Secondary | ICD-10-CM | POA: Diagnosis not present

## 2019-01-02 DIAGNOSIS — Y92009 Unspecified place in unspecified non-institutional (private) residence as the place of occurrence of the external cause: Secondary | ICD-10-CM | POA: Insufficient documentation

## 2019-01-02 DIAGNOSIS — W010XXA Fall on same level from slipping, tripping and stumbling without subsequent striking against object, initial encounter: Secondary | ICD-10-CM | POA: Insufficient documentation

## 2019-01-02 DIAGNOSIS — S42202A Unspecified fracture of upper end of left humerus, initial encounter for closed fracture: Secondary | ICD-10-CM

## 2019-01-02 DIAGNOSIS — F419 Anxiety disorder, unspecified: Secondary | ICD-10-CM | POA: Diagnosis not present

## 2019-01-02 DIAGNOSIS — Y9389 Activity, other specified: Secondary | ICD-10-CM | POA: Diagnosis not present

## 2019-01-02 DIAGNOSIS — S4992XA Unspecified injury of left shoulder and upper arm, initial encounter: Secondary | ICD-10-CM | POA: Diagnosis present

## 2019-01-02 DIAGNOSIS — Y998 Other external cause status: Secondary | ICD-10-CM | POA: Insufficient documentation

## 2019-01-02 DIAGNOSIS — S0990XA Unspecified injury of head, initial encounter: Secondary | ICD-10-CM | POA: Diagnosis not present

## 2019-01-02 DIAGNOSIS — S0101XA Laceration without foreign body of scalp, initial encounter: Secondary | ICD-10-CM

## 2019-01-02 DIAGNOSIS — D649 Anemia, unspecified: Secondary | ICD-10-CM | POA: Insufficient documentation

## 2019-01-02 DIAGNOSIS — W19XXXA Unspecified fall, initial encounter: Secondary | ICD-10-CM

## 2019-01-02 DIAGNOSIS — S01112A Laceration without foreign body of left eyelid and periocular area, initial encounter: Secondary | ICD-10-CM | POA: Diagnosis not present

## 2019-01-02 MED ORDER — OXYCODONE-ACETAMINOPHEN 5-325 MG PO TABS
2.0000 | ORAL_TABLET | Freq: Once | ORAL | Status: AC
  Administered 2019-01-02: 2 via ORAL
  Filled 2019-01-02: qty 2

## 2019-01-02 MED ORDER — TETANUS-DIPHTH-ACELL PERTUSSIS 5-2.5-18.5 LF-MCG/0.5 IM SUSP
0.5000 mL | Freq: Once | INTRAMUSCULAR | Status: AC
  Administered 2019-01-02: 0.5 mL via INTRAMUSCULAR
  Filled 2019-01-02: qty 0.5

## 2019-01-02 MED ORDER — OXYCODONE-ACETAMINOPHEN 5-325 MG PO TABS: 1.0000 | ORAL_TABLET | Freq: Three times a day (TID) | ORAL | 0 refills | Status: DC | PRN

## 2019-01-02 MED ORDER — LIDOCAINE-EPINEPHRINE 2 %-1:100000 IJ SOLN
10.0000 mL | Freq: Once | INTRAMUSCULAR | Status: AC
  Administered 2019-01-02: 5 mL via INTRADERMAL
  Filled 2019-01-02: qty 1

## 2019-01-02 NOTE — ED Triage Notes (Signed)
Tripped over cat. Left shoulder pain and lac to left eyebrow

## 2019-01-02 NOTE — ED Provider Notes (Signed)
[facility] Emergency Department Provider Note       Time seen: ----------------------------------------- 10:35 PM on 01/02/2019 -----------------------------------------   I have reviewed the triage vital signs and the nursing notes.  HISTORY   Chief Complaint Fall    HPI [name] is a 53 y.o. female with a history of anemia, anxiety, chronic back pain, diabetes, headache, hypertension, hypothyroidism who presents to the ED for mechanical fall that occurred at home.  Patient states she tripped over her cat, she is complaining of left shoulder pain and sustained a laceration to the left eyebrow.  He complains of severe pain to the left shoulder.  Past Medical History:  Diagnosis Date  . Anemia   . Anxiety and depression   . Chronic back pain   . DM2 (diabetes mellitus, type 2) (HCC)    improved since gastric bypass  . Headache    stress/sinus - 2-3x/wk  . HLD (hyperlipidemia)   . HTN (hypertension)    improved since gastric bypass  . Hypothyroid   . Left leg weakness    s/p back surgery  . Obesity   . S/P insertion of spinal cord stimulator   . Wears contact lenses     Patient Active Problem List   Diagnosis Date Noted  . Bronchitis 12/20/2018  . TIA (transient ischemic attack) 06/10/2017  . Bariatric surgery status   . Gastrointestinal ulcer   . Iron deficiency anemia 04/21/2017  . Nonintractable headache 02/23/2017  . Narcotic overdose (HCC) 01/12/2017  . Tremor 10/29/2016  . Benign paroxysmal positional vertigo 08/21/2016  . Dizziness 05/15/2015  . Right-sided low back pain without sciatica 10/27/2014  . Hyperpigmentation 05/05/2014  . Viral wart on finger 05/05/2014  . Gluteal pain 12/28/2013  . Elevated LFTs 12/13/2013  . Screening for breast cancer 01/28/2013  . Depression with anxiety 10/26/2012  . Anemia 05/05/2012  . Hypothyroidism 10/28/2007  . HYPERCHOLESTEROLEMIA, PURE 05/21/2007    Past Surgical History:   Procedure Laterality Date  . Abd [place] - gallstones  5/03  . BACK SURGERY  2009   decompressive laminectomy  . BARIATRIC SURGERY  03/02/2012   gastric bypass; Dr Smitty Cords  . CHOLECYSTECTOMY  6/03  . COLONOSCOPY WITH PROPOFOL N/A 05/01/2017   Procedure: COLONOSCOPY WITH PROPOFOL;  Surgeon: Midge Minium, MD;  Location: [facility];  Service: Gastroenterology;  Laterality: N/A;  . DILATION AND CURETTAGE OF UTERUS  1/05  . ESOPHAGOGASTRODUODENOSCOPY (EGD) WITH PROPOFOL N/A 05/01/2017   Procedure: ESOPHAGOGASTRODUODENOSCOPY (EGD) WITH PROPOFOL;  Surgeon: Midge Minium, MD;  Location: [facility];  Service: Gastroenterology;  Laterality: N/A;  . KNEE SURGERY     L - arthroscopic  . LIVER BIOPSY  6/03   fatty liver  . LUMBAR SPINE SURGERY  2015   Dr. Amalia Greenhouse  . NASAL SINUS SURGERY    . PANNICULECTOMY  08/2013   [facility], Dr. Diamond Nickel  . sleep study - apnea  04/2001   improved since gastric bypass    Allergies Glipizide; Hydrocodone-acetaminophen; Lyrica [pregabalin]; Metformin; Penicillins; and Adhesive [tape]  Social History Social History   Tobacco Use  . Smoking status: Never Smoker  . Smokeless tobacco: Never Used  . Tobacco comment: tobacco use - no  Substance Use Topics  . Alcohol use: No  . Drug use: No   Review of Systems Constitutional: Negative for fever. Cardiovascular: Negative for chest pain. Respiratory: Negative for shortness of breath. Gastrointestinal: Negative for abdominal pain, vomiting and diarrhea. Musculoskeletal: Positive for left shoulder pain Skin:  Positive for left periorbital laceration Neurological: Negative for headaches, focal weakness or numbness.  All systems negative/normal/unremarkable except as stated in the HPI  ____________________________________________   PHYSICAL EXAM:  VITAL SIGNS: ED Triage Vitals  Enc Vitals Group     BP 01/02/19 2232 96/79     Pulse Rate 01/02/19 2232 (!) 104     Resp 01/02/19 2232 18     Temp  01/02/19 2232 98.5 F (36.9 C)     Temp Source 01/02/19 2232 Oral     SpO2 01/02/19 2232 98 %     Weight 01/02/19 2233 220 lb 0.3 oz (99.8 kg)     Height 01/02/19 2233 5\' 5"  (1.651 m)     Head Circumference --      Peak Flow --      Pain Score 01/02/19 2233 10     Pain Loc --      Pain Edu? --      Excl. in GC? --    Constitutional: Alert and oriented. Well appearing and in no distress. Eyes: Conjunctivae are normal. Normal extraocular movements. ENT      Head: Normocephalic, 2 cm laceration is noted over the superior lateral periorbital area on the left      Nose: No congestion/rhinnorhea.      Mouth/Throat: Mucous membranes are moist.      Neck: No stridor. Cardiovascular: Normal rate, regular rhythm. No murmurs, rubs, or gallops. Respiratory: Normal respiratory effort without tachypnea nor retractions. Breath sounds are clear and equal bilaterally. No wheezes/rales/rhonchi. Gastrointestinal: Soft and nontender. Normal bowel sounds Musculoskeletal: Severe pain with range of motion of left shoulder, left shoulder swelling is noted. Neurologic:  Normal speech and language. No gross focal neurologic deficits are appreciated.  Skin: Left periorbital laceration is noted as dictated above Psychiatric: Mood and affect are normal. Speech and behavior are normal.  ____________________________________________  ED COURSE:  As part of my medical decision making, I reviewed the following data within the electronic MEDICAL RECORD NUMBER History obtained from family if available, nursing notes, old chart and ekg, as well as notes from prior ED visits. Patient presented for a mechanical fall, we will assess with labs and imaging as indicated at this time.   Marland Kitchen..Laceration Repair Date/Time: 01/02/2019 11:20 PM Performed by: Emily FilbertWilliams, Almadelia Looman E, MD Authorized by: Emily FilbertWilliams, Cola Highfill E, MD   Consent:    Consent obtained:  Verbal   Consent given by:  Patient   Risks discussed:  Infection, pain,  retained foreign body, poor cosmetic result and poor wound healing Anesthesia (see MAR for exact dosages):    Anesthesia method:  Local infiltration   Local anesthetic:  Lidocaine 1% WITH epi Laceration details:    Location:  Face   Face location:  L eyebrow   Length (cm):  2   Depth (mm):  5 Repair type:    Repair type:  Simple Exploration:    Hemostasis achieved with:  Direct pressure   Wound exploration: entire depth of wound probed and visualized     Contaminated: no   Treatment:    Area cleansed with:  Saline   Amount of cleaning:  Extensive   Irrigation solution:  Sterile saline   Visualized foreign bodies/material removed: no   Skin repair:    Repair method:  Sutures   Suture size:  4-0   Suture material:  Prolene   Suture technique:  Simple interrupted   Number of sutures:  6 Approximation:    Approximation:  Close Post-procedure details:  Dressing:  Sterile dressing   Patient tolerance of procedure:  Tolerated well, no immediate complications    [name] was evaluated in Emergency Department on 01/02/2019 for the symptoms described in the history of present illness. She was evaluated in the context of the global COVID-19 pandemic, which necessitated consideration that the patient might be at risk for infection with the SARS-CoV-2 virus that causes COVID-19. Institutional protocols and algorithms that pertain to the evaluation of patients at risk for COVID-19 are in a state of rapid change based on information released by regulatory bodies including the CDC and federal and state organizations. These policies and algorithms were followed during the patient's care in the ED.  ____________________________________________   RADIOLOGY Images were viewed by me  CT head, left shoulder x-rays IMPRESSION: Comminuted proximal humerus fracture with extension into the humeral Head. IMPRESSION: 1. No acute intracranial abnormality. 2. Stable appearance of right  frontal encephalomalacia. 3. Scalp contusion left orbital region. ____________________________________________   DIFFERENTIAL DIAGNOSIS   Contusion, fracture, laceration, subdural  FINAL ASSESSMENT AND PLAN  Fall, minor head injury, laceration, proximal humerus fracture   Plan: The patient had presented for mechanical fall that occurred at home. Patient's imaging did reveal a comminuted proximal humerus fracture as expected.  She is placed in a shoulder immobilizer.  Wound was repaired as dictated above.  Follow-up in 10 days for suture removal, follow-up with orthopedic surgery in 2 days for recheck.   Ulice Dash, MD    Note: This note was generated in part or whole with voice recognition software. Voice recognition is usually quite accurate but there are transcription errors that can and very often do occur. I apologize for any typographical errors that were not detected and corrected.     Emily Filbert, MD 01/02/19 2322

## 2019-01-02 NOTE — ED Notes (Signed)
Patient transported to CT 

## 2019-01-02 NOTE — ED Notes (Signed)
Pt to xr 

## 2019-01-04 ENCOUNTER — Telehealth: Payer: Self-pay

## 2019-01-04 NOTE — Telephone Encounter (Signed)
No referral in chart for ortho.

## 2019-01-04 NOTE — Telephone Encounter (Signed)
Copied from CRM [id number]. Topic: Referral - Status >> Jan 04, 2019 11:36 AM Dalphine Handing A wrote: Reason for CRM: Patient called to check on the status of her referral to Dr. Martha Clan located at [facility] [phone]. Patient requested a callback.

## 2019-01-05 NOTE — Telephone Encounter (Signed)
Sent message for sarah to call pt in regard to this

## 2019-01-11 ENCOUNTER — Telehealth: Payer: Self-pay | Admitting: Family

## 2019-01-11 NOTE — Telephone Encounter (Signed)
Pt needs to have stitch removed pt fell and broke left shoulder and busted left eye. I scheduled pt for virtual appt. Please advise when pt needs to come in. Thank you!

## 2019-01-12 ENCOUNTER — Other Ambulatory Visit: Payer: Self-pay

## 2019-01-12 ENCOUNTER — Ambulatory Visit (INDEPENDENT_AMBULATORY_CARE_PROVIDER_SITE_OTHER): Payer: Self-pay | Admitting: Family

## 2019-01-12 ENCOUNTER — Encounter: Payer: Self-pay | Admitting: Family

## 2019-01-12 DIAGNOSIS — S0101XD Laceration without foreign body of scalp, subsequent encounter: Secondary | ICD-10-CM

## 2019-01-12 DIAGNOSIS — S0101XA Laceration without foreign body of scalp, initial encounter: Secondary | ICD-10-CM | POA: Insufficient documentation

## 2019-01-12 NOTE — Progress Notes (Signed)
Subjective:    Patient ID: Cheryl StainMelissa Carol Becker, female    DOB: December 12, 1965, 53 y.o.   MRN: [id number]  CC: Cheryl StainMelissa Carol Becker is a 53 y.o. female who presents today for an acute visit.    HPI: HPI  Feels well today, no complaints. Sitting car. Accompanied by partner  ED 01/02/19 fall after tripping over her cat.  Also complained of left shoulder pain, laceration to left eyebrow.  Per emergency room notes, patient was a simple suture approximately 2cm with 6 sutures.   CT head, left shoulder x-rays.  CT showed no acute intracranial abnormality.  Scalp contusion the left orbital region. Proximal humeral fracture left .   Has seen Dr Martha ClanKrasinski, orthopedic. Taking oxycodone.  Plan to swear sling, no surgery planned. Seeing him 5 days.   HISTORY:  Past Medical History:  Diagnosis Date  . Anemia   . Anxiety and depression   . Chronic back pain   . DM2 (diabetes mellitus, type 2) (HCC)    improved since gastric bypass  . Headache    stress/sinus - 2-3x/wk  . HLD (hyperlipidemia)   . HTN (hypertension)    improved since gastric bypass  . Hypothyroid   . Left leg weakness    s/p back surgery  . Obesity   . S/P insertion of spinal cord stimulator   . Wears contact lenses    Past Surgical History:  Procedure Laterality Date  . Abd [place] - gallstones  5/03  . BACK SURGERY  2009   decompressive laminectomy  . BARIATRIC SURGERY  03/02/2012   gastric bypass; Dr Smitty CordsBruce  . CHOLECYSTECTOMY  6/03  . COLONOSCOPY WITH PROPOFOL N/A 05/01/2017   Procedure: COLONOSCOPY WITH PROPOFOL;  Surgeon: Midge MiniumWohl, Darren, MD;  [facility];  Service: Gastroenterology;  Laterality: N/A;  . DILATION AND CURETTAGE OF UTERUS  1/05  . ESOPHAGOGASTRODUODENOSCOPY (EGD) WITH PROPOFOL N/A 05/01/2017   Procedure: ESOPHAGOGASTRODUODENOSCOPY (EGD) WITH PROPOFOL;  Surgeon: Midge MiniumWohl, Darren, MD;  [facility];  Service: Gastroenterology;  Laterality: N/A;  . KNEE SURGERY     L - arthroscopic   . LIVER BIOPSY  6/03   fatty liver  . LUMBAR SPINE SURGERY  2015   Dr. Amalia Greenhouseehmig  . NASAL SINUS SURGERY    . PANNICULECTOMY  08/2013   [facility], Dr. Diamond NickelErdman  . sleep study - apnea  04/2001   improved since gastric bypass   Family History  Problem Relation Age of Onset  . Lung cancer Father   . Diabetes Mother   . Diabetes Sister   . Fibromyalgia Sister   . Colon cancer Maternal Aunt   . Skin cancer Paternal Aunt   . Skin cancer Paternal Uncle     Allergies: Glipizide; Hydrocodone-acetaminophen; Lyrica [pregabalin]; Metformin; Penicillins; and Adhesive [tape] Current Outpatient Medications on File Prior to Visit  Medication Sig Dispense Refill  . albuterol (PROVENTIL HFA;VENTOLIN HFA) 108 (90 Base) MCG/ACT inhaler Inhale 2 puffs into the lungs every 6 (six) hours as needed for wheezing or shortness of breath. 1 Inhaler 0  . azithromycin (ZITHROMAX) 250 MG tablet Take 2 tablets ( total 500 mg) PO on day 1, then take 1 tablet ( total 250 mg) by mouth q24h x 4 days. 6 tablet 0  . baclofen (LIORESAL) 10 MG tablet Take 10 mg by mouth daily.    . benzonatate (TESSALON) 100 MG capsule Take 1 capsule (100 mg total) by mouth 2 (two) times daily as needed for cough. 30 capsule 0  .  buPROPion (WELLBUTRIN XL) 300 MG 24 hr tablet Take 1 tablet (300 mg total) by mouth daily. 90 tablet 1  . DULoxetine (CYMBALTA) 60 MG capsule Take 60 mg by mouth 2 (two) times daily.    . fentaNYL (DURAGESIC - DOSED MCG/HR) 50 MCG/HR APPLY 1 PATCH(ES) EVERY 72 HOURS BY TRANSDERMAL ROUTE.  0  . gabapentin (NEURONTIN) 400 MG capsule Take 800 mg by mouth 2 (two) times daily.     Marland Kitchen levothyroxine (SYNTHROID, LEVOTHROID) 100 MCG tablet Take 1 tablet (100 mcg total) by mouth daily. 90 tablet 1  . oxyCODONE-acetaminophen (PERCOCET) 5-325 MG tablet Take 1 tablet by mouth every 8 (eight) hours as needed. 20 tablet 0   No current facility-administered medications on file prior to visit.     Social History   Tobacco Use  .  Smoking status: Never Smoker  . Smokeless tobacco: Never Used  . Tobacco comment: tobacco use - no  Substance Use Topics  . Alcohol use: No  . Drug use: No    Review of Systems  Constitutional: Negative for chills and fever.  Respiratory: Negative for cough.   Cardiovascular: Negative for chest pain and palpitations.  Gastrointestinal: Negative for nausea and vomiting.  Musculoskeletal: Positive for arthralgias (left arm pain).  Skin: Negative for rash and wound.      Objective:    LMP 12/16/2015    Physical Exam Constitutional:      General: She is not in acute distress.    Appearance: She is not ill-appearing or diaphoretic.  Pulmonary:     Effort: No tachypnea or accessory muscle usage.  Skin:    General: Skin is warm.  Neurological:     Mental Status: She is alert.  Psychiatric:        Attention and Perception: Attention normal.        Mood and Affect: Mood normal.        Speech: Speech normal.        Behavior: Behavior normal.     Comments: Wearing brace on left arm Comfortably sitting in car        Assessment & Plan:   Problem List Items Addressed This Visit      Other   Scalp laceration - Primary    Doing well today, patient is resting comfortably in car. Non toxic in appearance.  Following with orthopedics for left humeral fracture.  6 sutures removed in their entirety. Laceration well approximated.   Patient tolerated removal well.  No bleeding.  No purulent discharge.  Not regards to infection.  Follow-up, routine,in 2 days.   Of note, based on COVID-19, patient and I made joint decision for her to remain in car for her safetly and to not bring her into the for office vitals, a normal exam.  She had no other complaints, we felt this was appropriate for a simple procedure to remove the sutures           I am having Undrea C. [name] maintain her baclofen, DULoxetine, gabapentin, fentaNYL, azithromycin, benzonatate, albuterol, buPROPion,  levothyroxine, and oxyCODONE-acetaminophen.   No orders of the defined types were placed in this encounter.   Return precautions given.   Risks, benefits, and alternatives of the medications and treatment plan prescribed today were discussed, and patient expressed understanding.   Education regarding symptom management and diagnosis given to patient on AVS.  Continue to follow with Allegra Grana, FNP for routine health maintenance.   [name] and I agreed with plan.  Mable Paris, FNP

## 2019-01-12 NOTE — Assessment & Plan Note (Addendum)
Doing well today, patient is resting comfortably in car. Non toxic in appearance.  Following with orthopedics for left humeral fracture.  6 sutures removed in their entirety. Laceration well approximated.   Patient tolerated removal well.  No bleeding.  No purulent discharge.  Not regards to infection.  Follow-up, routine,in 2 days.   Of note, based on COVID-19, patient and I made joint decision for her to remain in car for her safetly and to not bring her into the for office vitals, a normal exam.  She had no other complaints, we felt this was appropriate for a simple procedure to remove the sutures

## 2019-01-12 NOTE — Patient Instructions (Addendum)
  Watch out any signs of infection including redness, purulent discharge, increased  Use bandaid and suncreen to prevent scarring.   See you Friday!

## 2019-01-12 NOTE — Telephone Encounter (Signed)
LMTCB. I left detailed message stating that I thought that all was squared away & she was being seen on Friday. I just wanted to make sure if this.

## 2019-01-12 NOTE — Telephone Encounter (Signed)
Copied from CRM [id number]. Topic: General - Other >> Jan 12, 2019  8:24 AM Percival Spanish wrote:  Pt returned cal lto the office , no answer at the office

## 2019-01-12 NOTE — Progress Notes (Signed)
I called patient & reviewed list of symptoms. Patient stated that she has not had any symptoms, nor has been exposed to anyone sick or dx with Covid-19.   Patient does have ortho appt.

## 2019-01-12 NOTE — Telephone Encounter (Signed)
I spoke with patient & she is coming in today at 1:15 for just her suture removal.

## 2019-01-12 NOTE — Telephone Encounter (Signed)
Call pt  Her stiches were placed on 4/19 therefore 10 days from then would have been the 28th.  Do we have any openings today? Please put her in a slot as we would need to perform doxy for actual appt  and then I can run out to car and remove.   Also, please advise patient that she has unread mychart message per below:   Pleasure seeing you today via WebEx.  I have sent in azithromycin ( antibiotic) for you to start . Ensure to take probiotics while on antibiotics and also for 2 weeks after completion. It is important to re-colonize the gut with good bacteria and also to prevent any diarrheal infections associated with antibiotic use.   Use albuterol every 6 hours for first 24 hours to get good medication into the lungs and loosen congestion; after, you may use as needed and eventually stop all together when cough resolves.   May use Tessalon Perles as needed.   As discussed this morning, you are very overdue for your physical, mammogram, Pap smear, labs. Please please make an appointment in May so we can do all of this and get you up-to-date.   I will look forward to seeing you.

## 2019-01-14 ENCOUNTER — Other Ambulatory Visit: Payer: Self-pay

## 2019-01-14 ENCOUNTER — Ambulatory Visit (INDEPENDENT_AMBULATORY_CARE_PROVIDER_SITE_OTHER): Payer: Self-pay | Admitting: Family

## 2019-01-14 ENCOUNTER — Encounter: Payer: Self-pay | Admitting: Family

## 2019-01-14 DIAGNOSIS — Z1239 Encounter for other screening for malignant neoplasm of breast: Secondary | ICD-10-CM

## 2019-01-14 DIAGNOSIS — G459 Transient cerebral ischemic attack, unspecified: Secondary | ICD-10-CM

## 2019-01-14 DIAGNOSIS — D509 Iron deficiency anemia, unspecified: Secondary | ICD-10-CM

## 2019-01-14 DIAGNOSIS — E039 Hypothyroidism, unspecified: Secondary | ICD-10-CM

## 2019-01-14 DIAGNOSIS — F418 Other specified anxiety disorders: Secondary | ICD-10-CM

## 2019-01-14 DIAGNOSIS — Z124 Encounter for screening for malignant neoplasm of cervix: Secondary | ICD-10-CM

## 2019-01-14 MED ORDER — LEVOTHYROXINE SODIUM 100 MCG PO TABS: 100.0000 ug | ORAL_TABLET | Freq: Every day | ORAL | 1 refills | Status: DC

## 2019-01-14 NOTE — Progress Notes (Signed)
This visit type was conducted due to national recommendations for restrictions regarding the COVID-19 pandemic (e.g. social distancing).  This format is felt to be most appropriate for this patient at this time.  All issues noted in this document were discussed and addressed.  No physical exam was performed (except for noted visual exam findings with Video Visits). Virtual Visit via Video Note  I connected with@  on 01/14/19 at  8:00 AM EDT by a video enabled telemedicine application and verified that I am speaking with the correct person using two identifiers.  Location patient: home Location provider:work  Persons participating in the virtual visit: patient, provider  I discussed the limitations of evaluation and management by telemedicine and the availability of in person appointments. The patient expressed understanding and agreed to proceed.   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 Done Dr Andee Poles ( follows with him for vertigo).   Not on  ASA due to h/o gastric bypass.   Depression- doing well on wellbutrin. Sleeping well. No si/hi.   Hypothyroidism-compliant with medication.  Feels well on this dose  No longer following with Dr Smitty Cords since it has been 5 years  Pap due.   Follows with Dr Dustin Folks pain management  ROS: See pertinent positives and negatives per HPI.  Past Medical History:  Diagnosis Date  . Anemia   . Anxiety and depression   . Chronic back pain   . DM2 (diabetes mellitus, type 2) (HCC)    improved since gastric bypass  . Headache    stress/sinus - 2-3x/wk  . HLD (hyperlipidemia)   . HTN (hypertension)    improved since gastric bypass  . Hypothyroid   . Left leg weakness    s/p back surgery  . Obesity   . S/P insertion of spinal cord stimulator   . Wears contact lenses     Past Surgical History:  Procedure Laterality Date  . Abd [place] - gallstones  5/03  . BACK SURGERY  2009   decompressive laminectomy  . BARIATRIC  SURGERY  03/02/2012   gastric bypass; Dr Smitty Cords  . CHOLECYSTECTOMY  6/03  . COLONOSCOPY WITH PROPOFOL N/A 05/01/2017   Procedure: COLONOSCOPY WITH PROPOFOL;  Surgeon: Midge Minium, MD;  [facility];  Service: Gastroenterology;  Laterality: N/A;  . DILATION AND CURETTAGE OF UTERUS  1/05  . ESOPHAGOGASTRODUODENOSCOPY (EGD) WITH PROPOFOL N/A 05/01/2017   Procedure: ESOPHAGOGASTRODUODENOSCOPY (EGD) WITH PROPOFOL;  Surgeon: Midge Minium, MD;  Location: [facility];  Service: Gastroenterology;  Laterality: N/A;  . KNEE SURGERY     L - arthroscopic  . LIVER BIOPSY  6/03   fatty liver  . LUMBAR SPINE SURGERY  2015   Dr. Amalia Greenhouse  . NASAL SINUS SURGERY    . PANNICULECTOMY  08/2013   [facility], Dr. Diamond Nickel  . sleep study - apnea  04/2001   improved since gastric bypass    Family History  Problem Relation Age of Onset  . Lung cancer Father   . Diabetes Mother   . Diabetes Sister   . Fibromyalgia Sister   . Colon cancer Maternal Aunt   . Skin cancer Paternal Aunt   . Skin cancer Paternal Uncle     SOCIAL HX: never smoker   Current Outpatient Medications:  .  albuterol (PROVENTIL HFA;VENTOLIN HFA) 108 (90 Base) MCG/ACT inhaler, Inhale 2 puffs into the lungs every 6 (six) hours as needed for wheezing or shortness of breath., Disp: 1 Inhaler, Rfl:  0 .  baclofen (LIORESAL) 10 MG tablet, Take 10 mg by mouth daily., Disp: , Rfl:  .  buPROPion (WELLBUTRIN XL) 300 MG 24 hr tablet, Take 300 mg by mouth daily., Disp: , Rfl:  .  DULoxetine (CYMBALTA) 60 MG capsule, Take 60 mg by mouth 2 (two) times daily., Disp: , Rfl:  .  fentaNYL (DURAGESIC - DOSED MCG/HR) 50 MCG/HR, APPLY 1 PATCH(ES) EVERY 72 HOURS BY TRANSDERMAL ROUTE., Disp: , Rfl: 0 .  gabapentin (NEURONTIN) 400 MG capsule, Take 800 mg by mouth 2 (two) times daily. , Disp: , Rfl:  .  levothyroxine (SYNTHROID) 100 MCG tablet, Take 1 tablet (100 mcg total) by mouth daily., Disp: 90 tablet, Rfl: 1 .  oxyCODONE-acetaminophen  (PERCOCET) 5-325 MG tablet, Take 1 tablet by mouth every 8 (eight) hours as needed., Disp: 20 tablet, Rfl: 0  EXAM:  VITALS per patient if applicable:  GENERAL: alert, oriented, appears well and in no acute distress  HEENT: atraumatic, conjunttiva clear, no obvious abnormalities on inspection of external nose and ears  NECK: normal movements of the head and neck  LUNGS: on inspection no signs of respiratory distress, breathing rate appears normal, no obvious gross SOB, gasping or wheezing  CV: no obvious cyanosis  MS: moves all visible extremities without noticeable abnormality  PSYCH/NEURO: pleasant and cooperative, no obvious depression or anxiety, speech and thought processing grossly intact  ASSESSMENT AND PLAN:  Discussed the following assessment and plan:  Depression with anxiety - Plan: CBC with Differential/Platelet, Comprehensive metabolic panel, Hemoglobin A1c, Lipid panel, TSH, VITAMIN D 25 Hydroxy (Vit-D Deficiency, Fractures), Microalbumin / creatinine urine ratio  Hypothyroidism, unspecified type - Plan: levothyroxine (SYNTHROID) 100 MCG tablet  Screening for cervical cancer - Plan: Ambulatory referral to Obstetrics / Gynecology  Screening for breast cancer - Plan: MM 3D SCREEN BREAST BILATERAL  TIA (transient ischemic attack) - Plan: Ambulatory referral to Neurology  Iron deficiency anemia, unspecified iron deficiency anemia type - Plan: Ferritin, IBC panel     Problem List Items Addressed This Visit      Cardiovascular and Mediastinum   TIA (transient ischemic attack)    Reviewed TIA history and respect patient today.  Fortunately, she is a non-smoker, no history of hypertension, diabetes.  Diabetes resolved after gastric bypass.  Unfortunately with h/o bypass, patient unable to take ASA.  Discussed that I would feel most comfortable if patient were to establish with neurologyand ensure we do all we can in regards to secondary prevention.  She agreed with  plan.  Referral has been placed      Relevant Orders   Ambulatory referral to Neurology     Endocrine   Hypothyroidism    Pending tsh      Relevant Medications   levothyroxine (SYNTHROID) 100 MCG tablet     Other   Anemia   Relevant Orders   Ferritin   IBC panel   Depression with anxiety - Primary    Doing well Wellbutrin, will continue      Relevant Medications   buPROPion (WELLBUTRIN XL) 300 MG 24 hr tablet   Other Relevant Orders   CBC with Differential/Platelet   Comprehensive metabolic panel   Hemoglobin A1c   Lipid panel   TSH   VITAMIN D 25 Hydroxy (Vit-D Deficiency, Fractures)   Microalbumin / creatinine urine ratio   Screening for breast cancer    Overdue.  Ordered and patient will schedule Of note, referral placed to GYN as Pap smear is also overdue  Relevant Orders   MM 3D SCREEN BREAST BILATERAL    Other Visit Diagnoses    Screening for cervical cancer       Relevant Orders   Ambulatory referral to Obstetrics / Gynecology      I discussed the assessment and treatment plan with the patient. The patient was provided an opportunity to ask questions and all were answered. The patient agreed with the plan and demonstrated an understanding of the instructions.   The patient was advised to call back or seek an in-person evaluation if the symptoms worsen or if the condition fails to improve as anticipated.   Rennie Plowman, FNP

## 2019-01-14 NOTE — Assessment & Plan Note (Signed)
Pending tsh 

## 2019-01-14 NOTE — Assessment & Plan Note (Signed)
Reviewed TIA history and respect patient today.  Fortunately, she is a non-smoker, no history of hypertension, diabetes.  Diabetes resolved after gastric bypass.  Unfortunately with h/o bypass, patient unable to take ASA.  Discussed that I would feel most comfortable if patient were to establish with neurologyand ensure we do all we can in regards to secondary prevention.  She agreed with plan.  Referral has been placed

## 2019-01-14 NOTE — Assessment & Plan Note (Signed)
Doing well Wellbutrin, will continue 

## 2019-01-14 NOTE — Assessment & Plan Note (Signed)
Overdue.  Ordered and patient will schedule Of note, referral placed to GYN as Pap smear is also overdue

## 2019-01-14 NOTE — Patient Instructions (Addendum)
Fasting  Labs for next week.   We placed a referral for mammogram this year. I asked that you call one the below lnd schedule this when it is convenient for you.   As discussed, I would like you to ask for 3D mammogram over the traditional 2D mammogram as new evidence suggest 3D is superior.    [facility]  [address]  [place], [place]  [phone]  * Offers 3D mammogram if you ask*   Today we discussed referrals, orders. Neurology, GYN   I have placed these orders in the system for you.  Please be sure to give [place] a call if you have not heard from our office regarding this. We should hear from [place] within ONE week with information regarding your appointment. If not, please let me know immediately.    Stay safe and let know if you need anything at all!

## 2019-01-17 ENCOUNTER — Other Ambulatory Visit (INDEPENDENT_AMBULATORY_CARE_PROVIDER_SITE_OTHER): Payer: Self-pay

## 2019-01-17 ENCOUNTER — Other Ambulatory Visit: Payer: Self-pay

## 2019-01-17 DIAGNOSIS — F418 Other specified anxiety disorders: Secondary | ICD-10-CM

## 2019-01-17 DIAGNOSIS — D509 Iron deficiency anemia, unspecified: Secondary | ICD-10-CM

## 2019-01-17 LAB — COMPREHENSIVE METABOLIC PANEL
ALT: 24 U/L (ref 0–35)
AST: 29 U/L (ref 0–37)
Albumin: 3.7 g/dL (ref 3.5–5.2)
Alkaline Phosphatase: 127 U/L — ABNORMAL HIGH (ref 39–117)
BUN: 13 mg/dL (ref 6–23)
CO2: 28 mEq/L (ref 19–32)
Calcium: 8.5 mg/dL (ref 8.4–10.5)
Chloride: 103 mEq/L (ref 96–112)
Creatinine, Ser: 0.74 mg/dL (ref 0.40–1.20)
GFR: 82.2 mL/min (ref >60.00)
Glucose, Bld: 91 mg/dL (ref 70–99)
Potassium: 3.9 mEq/L (ref 3.5–5.1)
Sodium: 140 mEq/L (ref 135–145)
Total Bilirubin: 0.4 mg/dL (ref 0.2–1.2)
Total Protein: 6.6 g/dL (ref 6.0–8.3)

## 2019-01-17 LAB — CBC WITH DIFFERENTIAL/PLATELET
Basophils Absolute: 0 10*3/uL (ref 0.0–0.1)
Basophils Relative: 0.5 % (ref 0.0–3.0)
Eosinophils Absolute: 0.3 10*3/uL (ref 0.0–0.7)
Eosinophils Relative: 4.7 % (ref 0.0–5.0)
HCT: 34.5 % — ABNORMAL LOW (ref 36.0–46.0)
Hemoglobin: 11.3 g/dL — ABNORMAL LOW (ref 12.0–15.0)
Lymphocytes Relative: 27.5 % (ref 12.0–46.0)
Lymphs Abs: 1.6 10*3/uL (ref 0.7–4.0)
MCHC: 32.6 g/dL (ref 30.0–36.0)
MCV: 90.6 fl (ref 78.0–100.0)
Monocytes Absolute: 0.4 10*3/uL (ref 0.1–1.0)
Monocytes Relative: 7.3 % (ref 3.0–12.0)
Neutro Abs: 3.5 10*3/uL (ref 1.4–7.7)
Neutrophils Relative %: 60 % (ref 43.0–77.0)
Platelets: 257 10*3/uL (ref 150.0–400.0)
RBC: 3.81 Mil/uL — ABNORMAL LOW (ref 3.87–5.11)
RDW: 14.3 % (ref 11.5–15.5)
WBC: 5.8 10*3/uL (ref 4.0–10.5)

## 2019-01-17 LAB — IBC PANEL
Iron: 108 ug/dL (ref 42–145)
Saturation Ratios: 28.5 % (ref 20.0–50.0)
Transferrin: 271 mg/dL (ref 212.0–360.0)

## 2019-01-17 LAB — LIPID PANEL
Cholesterol: 134 mg/dL (ref 0–200)
HDL: 41.8 mg/dL (ref >39.00)
LDL Cholesterol: 68 mg/dL (ref 0–99)
NonHDL: 92.34
Total CHOL/HDL Ratio: 3
Triglycerides: 121 mg/dL (ref 0.0–149.0)
VLDL: 24.2 mg/dL (ref 0.0–40.0)

## 2019-01-17 LAB — FERRITIN: Ferritin: 35.2 ng/mL (ref 10.0–291.0)

## 2019-01-17 LAB — VITAMIN D 25 HYDROXY (VIT D DEFICIENCY, FRACTURES): VITD: 19.66 ng/mL — ABNORMAL LOW (ref 30.00–100.00)

## 2019-01-17 LAB — TSH: TSH: 3.23 u[IU]/mL (ref 0.35–4.50)

## 2019-01-17 LAB — HEMOGLOBIN A1C: Hgb A1c MFr Bld: 6.2 % (ref 4.6–6.5)

## 2019-01-19 ENCOUNTER — Other Ambulatory Visit: Payer: Self-pay | Admitting: Family

## 2019-01-19 ENCOUNTER — Encounter: Payer: Self-pay | Admitting: Family

## 2019-01-19 DIAGNOSIS — K760 Fatty (change of) liver, not elsewhere classified: Secondary | ICD-10-CM

## 2019-01-19 DIAGNOSIS — D509 Iron deficiency anemia, unspecified: Secondary | ICD-10-CM

## 2019-01-19 MED ORDER — FERROUS SULFATE 325 (65 FE) MG PO TBEC: 325.0000 mg | DELAYED_RELEASE_TABLET | Freq: Three times a day (TID) | ORAL | 2 refills | Status: DC

## 2019-01-31 ENCOUNTER — Encounter: Payer: Self-pay | Admitting: *Deleted

## 2019-02-01 ENCOUNTER — Encounter: Payer: Self-pay | Admitting: Family

## 2019-02-09 ENCOUNTER — Telehealth: Payer: Self-pay | Admitting: *Deleted

## 2019-02-09 NOTE — Telephone Encounter (Signed)
noted 

## 2019-02-09 NOTE — Telephone Encounter (Signed)
Copied from CRM [id number]. Topic: General - Other >> Feb 09, 2019  9:06 AM [name] wrote: Reason for CRM: Patient called to say that she have not scheduled her mammogram because she have Becker broken left shoulder and can not lift her arm and until she is healed she will not be going in for Becker mammogram. Any questions please call Ph#  [phone]

## 2019-03-11 ENCOUNTER — Telehealth: Payer: Self-pay | Admitting: *Deleted

## 2019-03-11 NOTE — Telephone Encounter (Signed)
Coronavirus (COVID-19) Are you at risk?  Are you at risk for the Coronavirus (COVID-19)?  To be considered HIGH RISK for Coronavirus (COVID-19), you have to meet the following criteria:  . Traveled to [place], [place], [place], [place] or [place]; or in the [place] to [place], [place], [place], or [place]; and have fever, cough, and shortness of breath within the last 2 weeks of travel OR . Been in close contact with a person diagnosed with COVID-19 within the last 2 weeks and have fever, cough, and shortness of breath . IF YOU DO NOT MEET THESE CRITERIA, YOU ARE CONSIDERED LOW RISK FOR COVID-19.  What to do if you are HIGH RISK for COVID-19?  . If you are having a medical emergency, call 911. . Seek medical care right away. Before you go to a doctor's office, urgent care or emergency department, call ahead and tell them about your recent travel, contact with someone diagnosed with COVID-19, and your symptoms. You should receive instructions from your physician's office regarding next steps of care.  . When you arrive at healthcare provider, tell the healthcare staff immediately you have returned from visiting [place], [place], [place], [place] or [place]; or traveled in the [place] to [place], [place], [place], or [place]; in the last two weeks or you have been in close contact with a person diagnosed with COVID-19 in the last 2 weeks.   . Tell the health care staff about your symptoms: fever, cough and shortness of breath. . After you have been seen by a medical provider, you will be either: o Tested for (COVID-19) and discharged home on quarantine except to seek medical care if symptoms worsen, and asked to  - Stay home and avoid contact with others until you get your results (4-5 days)  - Avoid travel on public transportation if possible (such as bus, train, or airplane) or o Sent to the Emergency Department by EMS for evaluation, COVID-19 testing, and possible  admission depending on your condition and test results.  What to do if you are LOW RISK for COVID-19?  Reduce your risk of any infection by using the same precautions used for avoiding the common cold or flu:  . Wash your hands often with soap and warm water for at least 20 seconds.  If soap and water are not readily available, use an alcohol-based hand sanitizer with at least 60% alcohol.  . If coughing or sneezing, cover your mouth and nose by coughing or sneezing into the elbow areas of your shirt or coat, into a tissue or into your sleeve (not your hands). . Avoid shaking hands with others and consider head nods or verbal greetings only. . Avoid touching your eyes, nose, or mouth with unwashed hands.  . Avoid close contact with people who are sick. . Avoid places or events with large numbers of people in one location, like concerts or sporting events. . Carefully consider travel plans you have or are making. . If you are planning any travel outside or inside the [place], visit the CDC's Travelers' Health webpage for the latest health notices. . If you have some symptoms but not all symptoms, continue to monitor at home and seek medical attention if your symptoms worsen. . If you are having a medical emergency, call 911.   ADDITIONAL HEALTHCARE OPTIONS FOR PATIENTS  [place] [facility] / e-Visit: [email or web address]         [facility]: [phone]  [facility]: [phone]                   [facility]: [phone]   Spoke with pt denies any sx.   , CMA 

## 2019-03-14 ENCOUNTER — Other Ambulatory Visit (HOSPITAL_COMMUNITY)
Admission: RE | Admit: 2019-03-14 | Discharge: 2019-03-14 | Disposition: A | Discharge status: Home / Self Care | Payer: Self-pay | Source: Ambulatory Visit | Attending: Certified Nurse Midwife | Admitting: Certified Nurse Midwife

## 2019-03-14 ENCOUNTER — Other Ambulatory Visit: Payer: Self-pay

## 2019-03-14 ENCOUNTER — Ambulatory Visit: Payer: Self-pay | Admitting: Certified Nurse Midwife

## 2019-03-14 ENCOUNTER — Encounter: Payer: Self-pay | Admitting: Certified Nurse Midwife

## 2019-03-14 VITALS — BP 119/82 | HR 66 | Ht 64.5 in | Wt 226.2 lb

## 2019-03-14 DIAGNOSIS — Z124 Encounter for screening for malignant neoplasm of cervix: Secondary | ICD-10-CM | POA: Insufficient documentation

## 2019-03-14 DIAGNOSIS — Z780 Asymptomatic menopausal state: Secondary | ICD-10-CM

## 2019-03-14 DIAGNOSIS — Z1239 Encounter for other screening for malignant neoplasm of breast: Secondary | ICD-10-CM

## 2019-03-14 DIAGNOSIS — E669 Obesity, unspecified: Secondary | ICD-10-CM

## 2019-03-14 DIAGNOSIS — N912 Amenorrhea, unspecified: Secondary | ICD-10-CM

## 2019-03-14 NOTE — Patient Instructions (Signed)

## 2019-03-14 NOTE — Progress Notes (Signed)
GYNECOLOGY ANNUAL PREVENTATIVE CARE ENCOUNTER NOTE  History:     [name] is a 53 y.o. No obstetric history on file. female here for a routine annual gynecologic exam.  Current complaints: weight gain. She states she had gastric bypass surgery 2013 and lost over 100 lbs, she has gained back 40-50 lbs . She has had 3 back surgeries and has a broken arm that has contributed to her weight gain. She would like to discuss weight loss program. She states she has history of abnormal menses. After her weight loss she started having regular periods again. Her periods stopped again in 2014 or 2015.   Denies abnormal vaginal bleeding, discharge, pelvic pain, problems with intercourse or other gynecologic concerns. She is currently sexually active with one female partner ( her wife).    Gynecologic History Patient's last menstrual period was 12/16/2015. Contraception: none Last Pap: 2013. Results were: normal  Last mammogram: 2013 Results were: normal  Obstetric History OB History  No obstetric history on file.    Past Medical History:  Diagnosis Date  . Anemia   . Anxiety and depression   . Chronic back pain   . DM2 (diabetes mellitus, type 2) (HCC)    improved since gastric bypass  . Headache    stress/sinus - 2-3x/wk  . HLD (hyperlipidemia)   . HTN (hypertension)    improved since gastric bypass  . Hypothyroid   . Left leg weakness    s/p back surgery  . Obesity   . S/P insertion of spinal cord stimulator   . Wears contact lenses     Past Surgical History:  Procedure Laterality Date  . Abd US - gallstones  5/03  . BACK SURGERY  2009   decompressive laminectomy  . BARIATRIC SURGERY  03/02/2012   gastric bypass; Dr Smitty CordsBruce  . CHOLECYSTECTOMY  6/03  . COLONOSCOPY WITH PROPOFOL N/A 05/01/2017   Procedure: COLONOSCOPY WITH PROPOFOL;  Surgeon: Midge MiniumWohl, Darren, MD;  [facility];  Service: Gastroenterology;  Laterality: N/A;  . DILATION AND CURETTAGE OF UTERUS   1/05  . ESOPHAGOGASTRODUODENOSCOPY (EGD) WITH PROPOFOL N/A 05/01/2017   Procedure: ESOPHAGOGASTRODUODENOSCOPY (EGD) WITH PROPOFOL;  Surgeon: Midge MiniumWohl, Darren, MD;  [facility];  Service: Gastroenterology;  Laterality: N/A;  . KNEE SURGERY     L - arthroscopic  . LIVER BIOPSY  6/03   fatty liver  . LUMBAR SPINE SURGERY  2015   Dr. Amalia Greenhouseehmig  . NASAL SINUS SURGERY    . PANNICULECTOMY  08/2013   [facility], Dr. Diamond NickelErdman  . sleep study - apnea  04/2001   improved since gastric bypass    Current Outpatient Medications on File Prior to Visit  Medication Sig Dispense Refill  . acyclovir (ZOVIRAX) 400 MG tablet Take by mouth.    Marland Kitchen. albuterol (PROVENTIL HFA;VENTOLIN HFA) 108 (90 Base) MCG/ACT inhaler Inhale 2 puffs into the lungs every 6 (six) hours as needed for wheezing or shortness of breath. 1 Inhaler 0  . baclofen (LIORESAL) 10 MG tablet Take 10 mg by mouth daily.    Marland Kitchen. buPROPion (WELLBUTRIN XL) 300 MG 24 hr tablet Take 300 mg by mouth daily.    . DULoxetine (CYMBALTA) 60 MG capsule Take 60 mg by mouth 2 (two) times daily.    . fentaNYL (DURAGESIC - DOSED MCG/HR) 50 MCG/HR APPLY 1 PATCH(ES) EVERY 72 HOURS BY TRANSDERMAL ROUTE.  0  . ferrous sulfate 325 (65 FE) MG EC tablet Take 1 tablet (325 mg total) by mouth  3 (three) times daily with meals. 90 tablet 2  . gabapentin (NEURONTIN) 400 MG capsule Take 800 mg by mouth 2 (two) times daily.     Marland Kitchen levothyroxine (SYNTHROID) 100 MCG tablet Take 1 tablet (100 mcg total) by mouth daily. 90 tablet 1  . promethazine (PHENERGAN) 12.5 MG tablet     . oxyCODONE-acetaminophen (PERCOCET) 5-325 MG tablet Take 1 tablet by mouth every 8 (eight) hours as needed. (Patient not taking: Reported on 03/14/2019) 20 tablet 0   No current facility-administered medications on file prior to visit.     Allergies  Allergen Reactions  . Latex Rash  . Glipizide Nausea And Vomiting    REACTION: nausea  . Hydrocodone-Acetaminophen Itching  . Lyrica [Pregabalin]      Weight gain  . Metformin Diarrhea    REACTION: GI  . Penicillins Itching and Swelling    Has patient had a PCN reaction causing immediate rash, facial/tongue/throat swelling, SOB or lightheadedness with hypotension: yes Has patient had a PCN reaction causing severe rash involving mucus membranes or skin necrosis: no Has patient had a PCN reaction that required hospitalization : yes ed visit Has patient had a PCN reaction occurring within the last 10 years: yes If all of the above answers are "NO", then may proceed with Cephalosporin use.   . Adhesive [Tape] Rash    Red, and itching    Social History:  reports that she has never smoked. She has never used smokeless tobacco. She reports that she does not drink alcohol or use drugs.  Family History  Problem Relation Age of Onset  . Lung cancer Father   . Diabetes Mother   . Diabetes Sister   . Fibromyalgia Sister   . Colon cancer Maternal Aunt   . Skin cancer Paternal Aunt   . Skin cancer Paternal Uncle     The following portions of the patient's history were reviewed and updated as appropriate: allergies, current medications, past family history, past medical history, past social history, past surgical history and problem list.  Review of Systems Pertinent items noted in HPI and remainder of comprehensive ROS otherwise negative.  Physical Exam:  BP 119/82   Pulse 66   Ht 5' 4.5" (1.638 m)   Wt 226 lb 3 oz (102.6 kg)   LMP 12/16/2015   BMI 38.23 kg/m  CONSTITUTIONAL: Well-developed, well-nourished, obese female in no acute distress.  HENT:  Normocephalic, atraumatic, External right and left ear normal. Oropharynx is clear and moist EYES: Conjunctivae and EOM are normal. Pupils are equal, round, and reactive to light. No scleral icterus.  NECK: Normal range of motion, supple, no masses.  Normal thyroid.  SKIN: Skin is warm and dry. No rash noted. Not diaphoretic. No erythema. No pallor. She complains of excess facial hair.   MUSCULOSKELETAL: Normal range of motion. No tenderness.  No cyanosis, clubbing, or edema.  2+ distal pulses. NEUROLOGIC: Alert and oriented to person, place, and time. Normal reflexes, muscle tone coordination. No cranial nerve deficit noted. PSYCHIATRIC: Normal mood and affect. Normal behavior. Normal judgment and thought content. CARDIOVASCULAR: Normal heart rate noted, regular rhythm RESPIRATORY: Clear to auscultation bilaterally. Effort and breath sounds normal, no problems with respiration noted. BREASTS: Symmetric in size. No masses, skin changes, nipple drainage, or lymphadenopathy. ABDOMEN: Soft, normal bowel sounds, no distention noted.  No tenderness, rebound or guarding.  PELVIC: Normal appearing external genitalia; normal appearing vaginal mucosa and cervix.  No abnormal discharge noted.  Pap smear obtained. Contact bleeding present.  Normal uterine size, no other palpable masses, no uterine or adnexal tenderness.   Assessment and Plan:  Annual [facility] GYN exam . Will follow up results of pap smear and manage accordingly. Mammogram ordered- she will need to wait until her arm heals  Labs: FSH, testosterone, prolactin, CMP  Colonoscopy: pt states she had stool sample with her PCP Routine preventative health maintenance measures emphasized. Please refer to After Visit Summary for other counseling recommendations.  She will return for weight loss management visit prn.      Doreene BurkeAnnie Kyerra Vargo, CNM

## 2019-03-15 LAB — COMPREHENSIVE METABOLIC PANEL
ALT: 23 IU/L (ref 0–32)
AST: 26 IU/L (ref 0–40)
Albumin/Globulin Ratio: 1.8 (ref 1.2–2.2)
Albumin: 4 g/dL (ref 3.8–4.9)
Alkaline Phosphatase: 145 IU/L — ABNORMAL HIGH (ref 39–117)
BUN/Creatinine Ratio: 12 (ref 9–23)
BUN: 9 mg/dL (ref 6–24)
Bilirubin Total: <0.2 mg/dL (ref 0.0–1.2)
CO2: 24 mmol/L (ref 20–29)
Calcium: 8.6 mg/dL — ABNORMAL LOW (ref 8.7–10.2)
Chloride: 106 mmol/L (ref 96–106)
Creatinine, Ser: 0.75 mg/dL (ref 0.57–1.00)
GFR calc Af Amer: 106 mL/min/{1.73_m2} (ref >59)
GFR calc non Af Amer: 92 mL/min/{1.73_m2} (ref >59)
Globulin, Total: 2.2 g/dL (ref 1.5–4.5)
Glucose: 70 mg/dL (ref 65–99)
Potassium: 4 mmol/L (ref 3.5–5.2)
Sodium: 145 mmol/L — ABNORMAL HIGH (ref 134–144)
Total Protein: 6.2 g/dL (ref 6.0–8.5)

## 2019-03-15 LAB — FOLLICLE STIMULATING HORMONE: FSH: 9.3 m[IU]/mL

## 2019-03-15 LAB — TESTOSTERONE: Testosterone: <3 ng/dL — ABNORMAL LOW (ref 3–41)

## 2019-03-15 LAB — PROLACTIN: Prolactin: 8.1 ng/mL (ref 4.8–23.3)

## 2019-03-16 LAB — CYTOLOGY - PAP
Diagnosis: NEGATIVE
HPV: NOT DETECTED

## 2019-03-21 ENCOUNTER — Encounter: Payer: Self-pay | Admitting: Family

## 2019-03-21 ENCOUNTER — Other Ambulatory Visit: Payer: Self-pay

## 2019-03-21 ENCOUNTER — Ambulatory Visit (INDEPENDENT_AMBULATORY_CARE_PROVIDER_SITE_OTHER): Payer: No Typology Code available for payment source | Admitting: Family

## 2019-03-21 DIAGNOSIS — G459 Transient cerebral ischemic attack, unspecified: Secondary | ICD-10-CM

## 2019-03-21 DIAGNOSIS — K760 Fatty (change of) liver, not elsewhere classified: Secondary | ICD-10-CM | POA: Diagnosis not present

## 2019-03-21 DIAGNOSIS — G8929 Other chronic pain: Secondary | ICD-10-CM

## 2019-03-21 NOTE — Patient Instructions (Addendum)
Stop cymbalta  Consult with Dr Allen Norris in regards to elevated alk phos , fatty liver disease. Please let me know after seeing him if you would like to do labs for abnormal alk phos, calcium.   Please call the office if you do not hear from the office in regards to neurology appointment.    For long term disability, please call Dr Nicky Pugh. Information below.   [email or web address]  [name]. [address], [place], [place] [address] [phone]  Other resources for applying:   [email or web address]  [email or web address]  Stay safe and let me know how appointment with Dr Allen Norris goes, particularly if you would like to do labs for abnormal alk phos, calcium.

## 2019-03-21 NOTE — Progress Notes (Signed)
This visit type was conducted due to national recommendations for restrictions regarding the COVID-19 pandemic (e.g. social distancing).  This format is felt to be most appropriate for this patient at this time.  All issues noted in this document were discussed and addressed.  No physical exam was performed (except for noted visual exam findings with Video Visits). Virtual Visit via Video Note  I connected with@  on 03/21/19 at  8:00 AM EDT by a video enabled telemedicine application and verified that I am speaking with the correct person using two identifiers.  Location patient: home Location provider:work Persons participating in the virtual visit: patient, provider  I discussed the limitations of evaluation and management by telemedicine and the availability of in person appointments. The patient expressed understanding and agreed to proceed.   HPI:  CC: side effects from cymbalta  Started back on cymbalta ( 3 doses), 60 mg BID. Didn't titrate. Cymbalta makes her feel 'loopy and confused' and she plans to discuss with pain management. Notes didn't remember calling her mother. Didn't take cymbalta yesterday and feels better. Feels well and like herself this morning.Didnt find cymbalta to be particularly helpful for pain .   Never contacted by neurology   Phenergan is prn. On baclofen, fentanyl, gabapentin however doesn't feel sedated, 'loopy' on this regimen.    No recurrent fall.   No alcohol use, drug use.   Pap utd  Interested in long term disability.   Seeing Dr Allen Norris 03/29/19.  ROS: See pertinent positives and negatives per HPI.  Past Medical History:  Diagnosis Date  . Anemia   . Anxiety and depression   . Chronic back pain   . DM2 (diabetes mellitus, type 2) ([place])    improved since gastric bypass  . Headache    stress/sinus - 2-3x/wk  . HLD (hyperlipidemia)   . HTN (hypertension)    improved since gastric bypass  . Hypothyroid   . Left leg weakness    s/p back  surgery  . Obesity   . S/P insertion of spinal cord stimulator   . Wears contact lenses     Past Surgical History:  Procedure Laterality Date  . Abd [place] - gallstones  5/03  . BACK SURGERY  2009   decompressive laminectomy  . BARIATRIC SURGERY  03/02/2012   gastric bypass; Dr Darnell Level  . CHOLECYSTECTOMY  6/03  . COLONOSCOPY WITH PROPOFOL N/A 05/01/2017   Procedure: COLONOSCOPY WITH PROPOFOL;  Surgeon: Lucilla Lame, MD;  Location: [facility];  Service: Gastroenterology;  Laterality: N/A;  . DILATION AND CURETTAGE OF UTERUS  1/05  . ESOPHAGOGASTRODUODENOSCOPY (EGD) WITH PROPOFOL N/A 05/01/2017   Procedure: ESOPHAGOGASTRODUODENOSCOPY (EGD) WITH PROPOFOL;  Surgeon: Lucilla Lame, MD;  Location: [facility];  Service: Gastroenterology;  Laterality: N/A;  . KNEE SURGERY     L - arthroscopic  . LIVER BIOPSY  6/03   fatty liver  . LUMBAR SPINE SURGERY  2015   Dr. Mick Sell  . NASAL SINUS SURGERY    . PANNICULECTOMY  08/2013   [facility], Dr. Chrisandra Carota  . sleep study - apnea  04/2001   improved since gastric bypass    Family History  Problem Relation Age of Onset  . Lung cancer Father   . Diabetes Mother   . Heart failure Mother   . Diabetes Sister   . Fibromyalgia Sister   . Colon cancer Maternal Aunt   . Skin cancer Paternal Aunt   . Skin cancer Paternal Uncle     SOCIAL  HX: never smoker   Current Outpatient Medications:  .  acyclovir (ZOVIRAX) 400 MG tablet, Take by mouth., Disp: , Rfl:  .  albuterol (PROVENTIL HFA;VENTOLIN HFA) 108 (90 Base) MCG/ACT inhaler, Inhale 2 puffs into the lungs every 6 (six) hours as needed for wheezing or shortness of breath., Disp: 1 Inhaler, Rfl: 0 .  baclofen (LIORESAL) 10 MG tablet, Take 10 mg by mouth daily., Disp: , Rfl:  .  buPROPion (WELLBUTRIN XL) 300 MG 24 hr tablet, Take 300 mg by mouth daily., Disp: , Rfl:  .  fentaNYL (DURAGESIC - DOSED MCG/HR) 50 MCG/HR, APPLY 1 PATCH(ES) EVERY 72 HOURS BY TRANSDERMAL ROUTE., Disp: , Rfl: 0 .   ferrous sulfate 325 (65 FE) MG EC tablet, Take 1 tablet (325 mg total) by mouth 3 (three) times daily with meals., Disp: 90 tablet, Rfl: 2 .  gabapentin (NEURONTIN) 400 MG capsule, Take 800 mg by mouth 2 (two) times daily. , Disp: , Rfl:  .  levothyroxine (SYNTHROID) 100 MCG tablet, Take 1 tablet (100 mcg total) by mouth daily., Disp: 90 tablet, Rfl: 1 .  promethazine (PHENERGAN) 12.5 MG tablet, , Disp: , Rfl:   EXAM:  VITALS per patient if applicable:  GENERAL: alert, oriented, appears well and in no acute distress  HEENT: atraumatic, conjunttiva clear, no obvious abnormalities on inspection of external nose and ears  NECK: normal movements of the head and neck  LUNGS: on inspection no signs of respiratory distress, breathing rate appears normal, no obvious gross SOB, gasping or wheezing  CV: no obvious cyanosis  MS: moves all visible extremities without noticeable abnormality  PSYCH/NEURO: pleasant and cooperative, no obvious depression or anxiety, speech and thought processing grossly intact  ASSESSMENT AND PLAN:  Discussed the following assessment and plan:  Problem List Items Addressed This Visit      Cardiovascular and Mediastinum   TIA (transient ischemic attack)    Following up on referral to neurology. Sent Alleah Tuck a message        Digestive   Fatty liver    Seeing Dr Chriss Czar phos has further increased. Calcium appears low. Declines fractionate labs for alk phos, PTH, repeat Ca labs today. She will let me know after speaking with GI.         Other   Chronic pain    Due to side effects ( resolved now), advised to stop cymbalta. She will further discuss with Dr Vivien Rota. Will follow        Note: information on LTD given to patient.   I discussed the assessment and treatment plan with the patient. The patient was provided an opportunity to ask questions and all were answered. The patient agreed with the plan and demonstrated an understanding of the  instructions.   The patient was advised to call back or seek an in-person evaluation if the symptoms worsen or if the condition fails to improve as anticipated.   Mable Paris, FNP '

## 2019-03-21 NOTE — Assessment & Plan Note (Signed)
Seeing Dr Chriss Czar phos has further increased. Calcium appears low. Declines fractionate labs for alk phos, PTH, repeat Ca labs today. She will let me know after speaking with GI.

## 2019-03-21 NOTE — Assessment & Plan Note (Signed)
Following up on referral to neurology. [place] a message

## 2019-03-21 NOTE — Assessment & Plan Note (Signed)
Due to side effects ( resolved now), advised to stop cymbalta. She will further discuss with Dr Vivien Rota. Will follow

## 2019-03-24 ENCOUNTER — Telehealth: Payer: Self-pay

## 2019-03-24 NOTE — Telephone Encounter (Signed)
Coronavirus (COVID-19) Are you at risk?  Are you at risk for the Coronavirus (COVID-19)?  To be considered HIGH RISK for Coronavirus (COVID-19), you have to meet the following criteria:  . Traveled to [place], [place], [place], [place] or [place]; or in the [place] to [place], [place], [place], or [place]; and have fever, cough, and shortness of breath within the last 2 weeks of travel OR . Been in close contact with a person diagnosed with COVID-19 within the last 2 weeks and have fever, cough, and shortness of breath . IF YOU DO NOT MEET THESE CRITERIA, YOU ARE CONSIDERED LOW RISK FOR COVID-19.  What to do if you are HIGH RISK for COVID-19?  . If you are having a medical emergency, call 911. . Seek medical care right away. Before you go to a doctor's office, urgent care or emergency department, call ahead and tell them about your recent travel, contact with someone diagnosed with COVID-19, and your symptoms. You should receive instructions from your physician's office regarding next steps of care.  . When you arrive at healthcare provider, tell the healthcare staff immediately you have returned from visiting [place], [place], [place], [place] or [place]; or traveled in the [place] to [place], [place], [place], or [place]; in the last two weeks or you have been in close contact with a person diagnosed with COVID-19 in the last 2 weeks.   . Tell the health care staff about your symptoms: fever, cough and shortness of breath. . After you have been seen by a medical provider, you will be either: o Tested for (COVID-19) and discharged home on quarantine except to seek medical care if symptoms worsen, and asked to  - Stay home and avoid contact with others until you get your results (4-5 days)  - Avoid travel on public transportation if possible (such as bus, train, or airplane) or o Sent to the Emergency Department by EMS for evaluation, COVID-19 testing, and possible  admission depending on your condition and test results.  What to do if you are LOW RISK for COVID-19?  Reduce your risk of any infection by using the same precautions used for avoiding the common cold or flu:  . Wash your hands often with soap and warm water for at least 20 seconds.  If soap and water are not readily available, use an alcohol-based hand sanitizer with at least 60% alcohol.  . If coughing or sneezing, cover your mouth and nose by coughing or sneezing into the elbow areas of your shirt or coat, into a tissue or into your sleeve (not your hands). . Avoid shaking hands with others and consider head nods or verbal greetings only. . Avoid touching your eyes, nose, or mouth with unwashed hands.  . Avoid close contact with people who are [name]. . Avoid places or events with large numbers of people in one location, like concerts or sporting events. . Carefully consider travel plans you have or are making. . If you are planning any travel outside or inside the [place], visit the CDC's Travelers' Health webpage for the latest health notices. . If you have some symptoms but not all symptoms, continue to monitor at home and seek medical attention if your symptoms worsen. . If you are having a medical emergency, call 911.  03/24/19 SCREENING NEG SLS ADDITIONAL HEALTHCARE OPTIONS FOR PATIENTS  [facility] / e-Visit: [email or web address]         [facility]: [phone]    [facility]: [phone]                   [facility]: [phone]  

## 2019-03-25 ENCOUNTER — Encounter: Payer: Self-pay | Admitting: Certified Nurse Midwife

## 2019-03-25 ENCOUNTER — Other Ambulatory Visit: Payer: Self-pay

## 2019-03-25 ENCOUNTER — Ambulatory Visit (INDEPENDENT_AMBULATORY_CARE_PROVIDER_SITE_OTHER): Payer: No Typology Code available for payment source | Admitting: Certified Nurse Midwife

## 2019-03-25 VITALS — BP 131/75 | HR 84 | Ht 64.5 in | Wt 224.1 lb

## 2019-03-25 DIAGNOSIS — N912 Amenorrhea, unspecified: Secondary | ICD-10-CM

## 2019-03-25 DIAGNOSIS — R635 Abnormal weight gain: Secondary | ICD-10-CM

## 2019-03-25 DIAGNOSIS — Z9884 Bariatric surgery status: Secondary | ICD-10-CM

## 2019-03-25 NOTE — Progress Notes (Signed)
Pt is here for test results.

## 2019-03-25 NOTE — Progress Notes (Signed)
GYN ENCOUNTER NOTE  Subjective:       [name] is a 53 y.o. G0P0000 female here to discuss labs results and possiblity of starting medical weight loss program.   Previously seen by [facility] on 03/14/2019 for ANNUAL EXAM and amenorrhea. History of gastric bypass, currently obese.   Denies difficulty breathing or respiratory distress, chest pain, abdominal pain, vaginal bleeding, dysuria, and leg pain or swelling.    Gynecologic History  Patient's last menstrual period was 12/16/2015.  Contraception: none, same sex relationship  Last Pap: 02/2019. Results were: Negative/Negative  Last mammogram: due, last 2013. Results were: Normal  Obstetric History  OB History  Gravida Para Term Preterm AB Living  0 0 0 0 0 0  SAB TAB Ectopic Multiple Live Births  0 0 0 0 0    Past Medical History:  Diagnosis Date  . Anemia   . Anxiety and depression   . Chronic back pain   . DM2 (diabetes mellitus, type 2) ([place])    improved since gastric bypass  . Headache    stress/sinus - 2-3x/wk  . HLD (hyperlipidemia)   . HTN (hypertension)    improved since gastric bypass  . Hypothyroid   . Left leg weakness    s/p back surgery  . Obesity   . S/P insertion of spinal cord stimulator   . Wears contact lenses     Past Surgical History:  Procedure Laterality Date  . Abd [place] - gallstones  5/03  . BACK SURGERY  2009   decompressive laminectomy  . BARIATRIC SURGERY  03/02/2012   gastric bypass; Dr Darnell Level  . CHOLECYSTECTOMY  6/03  . COLONOSCOPY WITH PROPOFOL N/A 05/01/2017   Procedure: COLONOSCOPY WITH PROPOFOL;  Surgeon: Lucilla Lame, MD;  Location: [facility];  Service: Gastroenterology;  Laterality: N/A;  . DILATION AND CURETTAGE OF UTERUS  1/05  . ESOPHAGOGASTRODUODENOSCOPY (EGD) WITH PROPOFOL N/A 05/01/2017   Procedure: ESOPHAGOGASTRODUODENOSCOPY (EGD) WITH PROPOFOL;  Surgeon: Lucilla Lame, MD;  Location: [facility];  Service: Gastroenterology;  Laterality: N/A;  .  KNEE SURGERY     L - arthroscopic  . LIVER BIOPSY  6/03   fatty liver  . LUMBAR SPINE SURGERY  2015   Dr. Mick Sell  . NASAL SINUS SURGERY    . PANNICULECTOMY  08/2013   [facility], Dr. Chrisandra Carota  . sleep study - apnea  04/2001   improved since gastric bypass    Current Outpatient Medications on File Prior to Visit  Medication Sig Dispense Refill  . acyclovir (ZOVIRAX) 400 MG tablet Take by mouth.    Marland Kitchen albuterol (PROVENTIL HFA;VENTOLIN HFA) 108 (90 Base) MCG/ACT inhaler Inhale 2 puffs into the lungs every 6 (six) hours as needed for wheezing or shortness of breath. 1 Inhaler 0  . baclofen (LIORESAL) 10 MG tablet Take 10 mg by mouth daily.    Marland Kitchen buPROPion (WELLBUTRIN XL) 300 MG 24 hr tablet Take 300 mg by mouth daily.    . fentaNYL (DURAGESIC - DOSED MCG/HR) 50 MCG/HR APPLY 1 PATCH(ES) EVERY 72 HOURS BY TRANSDERMAL ROUTE.  0  . gabapentin (NEURONTIN) 400 MG capsule Take 800 mg by mouth 2 (two) times daily.     Marland Kitchen levothyroxine (SYNTHROID) 100 MCG tablet Take 1 tablet (100 mcg total) by mouth daily. 90 tablet 1  . promethazine (PHENERGAN) 12.5 MG tablet     . ferrous sulfate 325 (65 FE) MG EC tablet Take 1 tablet (325 mg total) by mouth 3 (three) times daily with  meals. (Patient not taking: Reported on 03/25/2019) 90 tablet 2   No current facility-administered medications on file prior to visit.     Allergies  Allergen Reactions  . Latex Rash  . Glipizide Nausea And Vomiting    REACTION: nausea  . Hydrocodone-Acetaminophen Itching  . Lyrica [Pregabalin]     Weight gain  . Metformin Diarrhea    REACTION: GI  . Penicillins Itching and Swelling    Has patient had a PCN reaction causing immediate rash, facial/tongue/throat swelling, SOB or lightheadedness with hypotension: yes Has patient had a PCN reaction causing severe rash involving mucus membranes or skin necrosis: no Has patient had a PCN reaction that required hospitalization : yes ed visit Has patient had a PCN reaction occurring within  the last 10 years: yes If all of the above answers are "NO", then may proceed with Cephalosporin use.   . Adhesive [Tape] Rash    Red, and itching    Social History   Socioeconomic History  . Marital status: Married    Spouse name: Not on file  . Number of children: Not on file  . Years of education: Not on file  . Highest education level: Not on file  Occupational History  . Not on file  Social Needs  . Financial resource strain: Not on file  . Food insecurity    Worry: Not on file    Inability: Not on file  . Transportation needs    Medical: Not on file    Non-medical: Not on file  Tobacco Use  . Smoking status: Never Smoker  . Smokeless tobacco: Never Used  . Tobacco comment: tobacco use - no  Substance and Sexual Activity  . Alcohol use: No  . Drug use: No  . Sexual activity: Not Currently    Birth control/protection: None  Lifestyle  . Physical activity    Days per week: Not on file    Minutes per session: Not on file  . Stress: Not on file  Relationships  . Social Musician on phone: Not on file    Gets together: Not on file    Attends religious service: Not on file    Active member of club or organization: Not on file    Attends meetings of clubs or organizations: Not on file    Relationship status: Not on file  . Intimate partner violence    Fear of current or ex partner: Not on file    Emotionally abused: Not on file    Physically abused: Not on file    Forced sexual activity: Not on file  Other Topics Concern  . Not on file  Social History Narrative   Partner- [name], no children; works in [place], does not get regular exercise.     Family History  Problem Relation Age of Onset  . Lung cancer Father   . Diabetes Mother   . Heart failure Mother   . Diabetes Sister   . Fibromyalgia Sister   . Colon cancer Maternal Aunt   . Skin cancer Paternal Aunt   . Skin cancer Paternal Uncle     The following portions of the patient's history  were reviewed and updated as appropriate: allergies, current medications, past family history, past medical history, past social history, past surgical history and problem list.  Review of Systems  ROS negative except as noted above. Information obtained from patient.   Objective:   BP 131/75   Pulse 84   Ht  5' 4.5" (1.638 m)   Wt 224 lb 2 oz (101.7 kg)   LMP 12/16/2015   BMI 37.88 kg/m    CONSTITUTIONAL: Well-developed, well-nourished female in no acute distress.   Recent Results (from the past 2160 hour(s))  IBC panel     Status: None   Collection Time: 01/17/19  9:43 AM  Result Value Ref Range   Iron 108 42 - 145 ug/dL   Transferrin 657.8271.0 469.6212.0 - 360.0 mg/dL   Saturation Ratios 29.528.5 20.0 - 50.0 %  Ferritin     Status: None   Collection Time: 01/17/19  9:43 AM  Result Value Ref Range   Ferritin 35.2 10.0 - 291.0 ng/mL  VITAMIN D 25 Hydroxy (Vit-D Deficiency, Fractures)     Status: Abnormal   Collection Time: 01/17/19  9:43 AM  Result Value Ref Range   VITD 19.66 (L) 30.00 - 100.00 ng/mL  TSH     Status: None   Collection Time: 01/17/19  9:43 AM  Result Value Ref Range   TSH 3.23 0.35 - 4.50 uIU/mL  Lipid panel     Status: None   Collection Time: 01/17/19  9:43 AM  Result Value Ref Range   Cholesterol 134 0 - 200 mg/dL    Comment: ATP III Classification       Desirable:  < 200 mg/dL               Borderline High:  200 - 239 mg/dL          High:  > = 284240 mg/dL   Triglycerides 132.4121.0 0.0 - 149.0 mg/dL    Comment: Normal:  <401<150 mg/dLBorderline High:  150 - 199 mg/dL   HDL 02.7241.80 >53.66>39.00 mg/dL   VLDL 44.024.2 0.0 - 34.740.0 mg/dL   LDL Cholesterol 68 0 - 99 mg/dL   Total CHOL/HDL Ratio 3     Comment:                Men          Women1/2 Average Risk     3.4          3.3Average Risk          5.0          4.42X Average Risk          9.6          7.13X Average Risk          15.0          11.0                       NonHDL 92.34     Comment: NOTE:  Non-HDL goal should be 30 mg/dL  higher than patient's LDL goal (i.e. LDL goal of < 70 mg/dL, would have non-HDL goal of < 100 mg/dL)  Hemoglobin Q2VA1c     Status: None   Collection Time: 01/17/19  9:43 AM  Result Value Ref Range   Hgb A1c MFr Bld 6.2 4.6 - 6.5 %    Comment: Glycemic Control Guidelines for People with Diabetes:Non Diabetic:  <6%Goal of Therapy: <7%Additional Action Suggested:  >8%   Comprehensive metabolic panel     Status: Abnormal   Collection Time: 01/17/19  9:43 AM  Result Value Ref Range   Sodium 140 135 - 145 mEq/L   Potassium 3.9 3.5 - 5.1 mEq/L   Chloride 103 96 - 112 mEq/L   CO2 28 19 - 32 mEq/L  Glucose, Bld 91 70 - 99 mg/dL   BUN 13 6 - 23 mg/dL   Creatinine, Ser 9.600.74 0.40 - 1.20 mg/dL   Total Bilirubin 0.4 0.2 - 1.2 mg/dL   Alkaline Phosphatase 127 (H) 39 - 117 U/L   AST 29 0 - 37 U/L   ALT 24 0 - 35 U/L   Total Protein 6.6 6.0 - 8.3 g/dL   Albumin 3.7 3.5 - 5.2 g/dL   Calcium 8.5 8.4 - 45.410.5 mg/dL   GFR 09.8182.20 >19.14>60.00 mL/min  CBC with Differential/Platelet     Status: Abnormal   Collection Time: 01/17/19  9:43 AM  Result Value Ref Range   WBC 5.8 4.0 - 10.5 K/uL   RBC 3.81 (L) 3.87 - 5.11 Mil/uL   Hemoglobin 11.3 (L) 12.0 - 15.0 g/dL   HCT 78.234.5 (L) 95.636.0 - 21.346.0 %   MCV 90.6 78.0 - 100.0 fl   MCHC 32.6 30.0 - 36.0 g/dL   RDW 08.614.3 57.811.5 - 46.915.5 %   Platelets 257.0 150.0 - 400.0 K/uL   Neutrophils Relative % 60.0 43.0 - 77.0 %   Lymphocytes Relative 27.5 12.0 - 46.0 %   Monocytes Relative 7.3 3.0 - 12.0 %   Eosinophils Relative 4.7 0.0 - 5.0 %   Basophils Relative 0.5 0.0 - 3.0 %   Neutro Abs 3.5 1.4 - 7.7 K/uL   Lymphs Abs 1.6 0.7 - 4.0 K/uL   Monocytes Absolute 0.4 0.1 - 1.0 K/uL   Eosinophils Absolute 0.3 0.0 - 0.7 K/uL   Basophils Absolute 0.0 0.0 - 0.1 K/uL  Cytology - PAP     Status: None   Collection Time: 03/14/19 12:00 AM  Result Value Ref Range   Adequacy      Satisfactory for evaluation  endocervical/transformation zone component PRESENT.   Diagnosis      NEGATIVE FOR  INTRAEPITHELIAL LESIONS OR MALIGNANCY.   HPV NOT DETECTED     Comment: Normal Reference Range - NOT Detected   Material Submitted CervicoVaginal Pap [ThinPrep Imaged]    CYTOLOGY - PAP PAP RESULT   FSH     Status: None   Collection Time: 03/14/19  9:07 AM  Result Value Ref Range   FSH 9.3 mIU/mL    Comment:                     Adult Female:                       Follicular phase      3.5 -  12.5                       Ovulation phase       4.7 -  21.5                       Luteal phase          1.7 -   7.7                       Postmenopausal       25.8 - 134.8   Comprehensive metabolic panel     Status: Abnormal   Collection Time: 03/14/19  9:07 AM  Result Value Ref Range   Glucose 70 65 - 99 mg/dL   BUN 9 6 - 24 mg/dL   Creatinine, Ser 6.290.75 0.57 - 1.00 mg/dL   GFR calc non  Af Amer 92 >59 mL/min/1.73   GFR calc Af Amer 106 >59 mL/min/1.73   BUN/Creatinine Ratio 12 9 - 23   Sodium 145 (H) 134 - 144 mmol/L   Potassium 4.0 3.5 - 5.2 mmol/L   Chloride 106 96 - 106 mmol/L   CO2 24 20 - 29 mmol/L   Calcium 8.6 (L) 8.7 - 10.2 mg/dL   Total Protein 6.2 6.0 - 8.5 g/dL   Albumin 4.0 3.8 - 4.9 g/dL   Globulin, Total 2.2 1.5 - 4.5 g/dL   Albumin/Globulin Ratio 1.8 1.2 - 2.2   Bilirubin Total <0.2 0.0 - 1.2 mg/dL   Alkaline Phosphatase 145 (H) 39 - 117 IU/L   AST 26 0 - 40 IU/L   ALT 23 0 - 32 IU/L  Testosterone     Status: Abnormal   Collection Time: 03/14/19  9:07 AM  Result Value Ref Range   Testosterone <3 (L) 3 - 41 ng/dL  Prolactin     Status: None   Collection Time: 03/14/19  9:07 AM  Result Value Ref Range   Prolactin 8.1 4.8 - 23.3 ng/mL     Assessment:   1. Amenorrhea   2. 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 Weight gain - Amb Referral to Bariatric Surgery   Plan:   Lab results reviewed with patient, verbalized understanding.   Patient agrees to ultrasound if bleeding returns or abdominal pain starts.   Encouraged  follow up with Bariatric Surgeon to discuss possible need for revision of procedure in lew of phentermine.   Advised CNM will look up information regarding use of phentermine in patients with history of gastric bypass.   Reviewed red flag symptoms and when to call.   RTC as needed.    Gunnar BullaJenkins Michelle , CNM [facility], [facility]

## 2019-03-25 NOTE — Patient Instructions (Signed)
Menopause Menopause is the normal time of life when menstrual periods stop completely. It is usually confirmed by 12 months without a menstrual period. The transition to menopause (perimenopause) most often happens between the ages of 45 and 55. During perimenopause, hormone levels change in your body, which can cause symptoms and affect your health. Menopause may increase your risk for:  Loss of bone (osteoporosis), which causes bone breaks (fractures).  Depression.  Hardening and narrowing of the arteries (atherosclerosis), which can cause heart attacks and strokes. What are the causes? This condition is usually caused by a natural change in hormone levels that happens as you get older. The condition may also be caused by surgery to remove both ovaries (bilateral oophorectomy). What increases the risk? This condition is more likely to start at an earlier age if you have certain medical conditions or treatments, including:  A tumor of the pituitary gland in the brain.  A disease that affects the ovaries and hormone production.  Radiation treatment for cancer.  Certain cancer treatments, such as chemotherapy or hormone (anti-estrogen) therapy.  Heavy smoking and excessive alcohol use.  Family history of early menopause. This condition is also more likely to develop earlier in women who are very thin. What are the signs or symptoms? Symptoms of this condition include:  Hot flashes.  Irregular menstrual periods.  Night sweats.  Changes in feelings about sex. This could be a decrease in sex drive or an increased comfort around your sexuality.  Vaginal dryness and thinning of the vaginal walls. This may cause painful intercourse.  Dryness of the skin and development of wrinkles.  Headaches.  Problems sleeping (insomnia).  Mood swings or irritability.  Memory problems.  Weight gain.  Hair growth on the face and chest.  Bladder infections or problems with urinating. How  is this diagnosed? This condition is diagnosed based on your medical history, a physical exam, your age, your menstrual history, and your symptoms. Hormone tests may also be done. How is this treated? In some cases, no treatment is needed. You and your health care provider should make a decision together about whether treatment is necessary. Treatment will be based on your individual condition and preferences. Treatment for this condition focuses on managing symptoms. Treatment may include:  Menopausal hormone therapy (MHT).  Medicines to treat specific symptoms or complications.  Acupuncture.  Vitamin or herbal supplements. Before starting treatment, make sure to let your health care provider know if you have a personal or family history of:  Heart disease.  Breast cancer.  Blood clots.  Diabetes.  Osteoporosis. Follow these instructions at home: Lifestyle  Do not use any products that contain nicotine or tobacco, such as cigarettes and e-cigarettes. If you need help quitting, ask your health care provider.  Get at least 30 minutes of physical activity on 5 or more days each week.  Avoid alcoholic and caffeinated beverages, as well as spicy foods. This may help prevent hot flashes.  Get 7-8 hours of sleep each night.  If you have hot flashes, try: ? Dressing in layers. ? Avoiding things that may trigger hot flashes, such as spicy food, warm places, or stress. ? Taking slow, deep breaths when a hot flash starts. ? Keeping a fan in your home and office.  Find ways to manage stress, such as deep breathing, meditation, or journaling.  Consider going to group therapy with other women who are having menopause symptoms. Ask your health care provider about recommended group therapy meetings. Eating and   drinking  Eat a healthy, balanced diet that contains whole grains, lean protein, low-fat dairy, and plenty of fruits and vegetables.  Your health care provider may recommend  adding more soy to your diet. Foods that contain soy include tofu, tempeh, and soy milk.  Eat plenty of foods that contain calcium and vitamin D for bone health. Items that are rich in calcium include low-fat milk, yogurt, beans, almonds, sardines, broccoli, and kale. Medicines  Take over-the-counter and prescription medicines only as told by your health care provider.  Talk with your health care provider before starting any herbal supplements. If prescribed, take vitamins and supplements as told by your health care provider. These may include: ? Calcium. Women age 53 and older should get 1,200 mg (milligrams) of calcium every day. ? Vitamin D. Women need 600-800 International Units of vitamin D each day. ? Vitamins B12 and B6. Aim for 50 micrograms of B12 and 1.5 mg of B6 each day. General instructions  Keep track of your menstrual periods, including: ? When they occur. ? How heavy they are and how long they last. ? How much time passes between periods.  Keep track of your symptoms, noting when they start, how often you have them, and how long they last.  Use vaginal lubricants or moisturizers to help with vaginal dryness and improve comfort during sex.  Keep all follow-up visits as told by your health care provider. This is important. This includes any group therapy or counseling. Contact a health care provider if:  You are still having menstrual periods after age 53.  You have pain during sex.  You have not had a period for 12 months and you develop vaginal bleeding. Get help right away if:  You have: ? Severe depression. ? Excessive vaginal bleeding. ? Pain when you urinate. ? A fast or irregular heart beat (palpitations). ? Severe headaches. ? Abdomen (abdominal) pain or severe indigestion.  You fell and you think you have a broken bone.  You develop leg or chest pain.  You develop vision problems.  You feel a lump in your breast. Summary  Menopause is the normal  time of life when menstrual periods stop completely. It is usually confirmed by 12 months without a menstrual period.  The transition to menopause (perimenopause) most often happens between the ages of 2745 and 3755.  Symptoms can be managed through medicines, lifestyle changes, and complementary therapies such as acupuncture.  Eat a balanced diet that is rich in nutrients to promote bone health and heart health and to manage symptoms during menopause. This information is not intended to replace advice given to you by your health care provider. Make sure you discuss any questions you have with your health care provider. Document Released: 11/22/2003 Document Revised: 08/14/2017 Document Reviewed: 10/04/2016 Elsevier Patient Education  2020 [employer] Phentermine tablets or capsules What is this medicine? PHENTERMINE (FEN ter meen) decreases your appetite. It is used with a reduced calorie diet and exercise to help you lose weight. This medicine may be used for other purposes; ask your health care provider or pharmacist if you have questions. COMMON BRAND NAME(S): Adipex-P, Atti-Plex P, Atti-Plex P Spansule, Fastin, Lomaira, Pro-Fast, Tara-8 What should I tell my health care provider before I take this medicine? They need to know if you have any of these conditions:  agitation or nervousness  diabetes  glaucoma  heart disease  high blood pressure  history of drug abuse or addiction  history of stroke  kidney disease  lung disease called Primary Pulmonary Hypertension (PPH)  taken an MAOI like Carbex, Eldepryl, Marplan, Nardil, or Parnate in last 14 days  taking stimulant medicines for attention disorders, weight loss, or to stay awake  thyroid disease  an unusual or allergic reaction to phentermine, other medicines, foods, dyes, or preservatives  pregnant or trying to get pregnant  breast-feeding How should I use this medicine? Take this medicine by mouth with a glass  of water. Follow the directions on the prescription label. The instructions for use may differ based on the product and dose you are taking. Avoid taking this medicine in the evening. It may interfere with sleep. Take your doses at regular intervals. Do not take your medicine more often than directed. Talk to your pediatrician regarding the use of this medicine in children. While this drug may be prescribed for children 17 years or older for selected conditions, precautions do apply. Overdosage: If you think you have taken too much of this medicine contact a poison control center or emergency room at once. NOTE: This medicine is only for you. Do not share this medicine with others. What if I miss a dose? If you miss a dose, take it as soon as you can. If it is almost time for your next dose, take only that dose. Do not take double or extra doses. What may interact with this medicine? Do not take this medicine with any of the following medications:  MAOIs like Carbex, Eldepryl, Marplan, Nardil, and Parnate  medicines for colds or breathing difficulties like pseudoephedrine or phenylephrine  procarbazine  sibutramine  stimulant medicines for attention disorders, weight loss, or to stay awake This medicine may also interact with the following medications:  certain medicines for depression, anxiety, or psychotic disturbances  linezolid  medicines for diabetes  medicines for high blood pressure This list may not describe all possible interactions. Give your health care provider a list of all the medicines, herbs, non-prescription drugs, or dietary supplements you use. Also tell them if you smoke, drink alcohol, or use illegal drugs. Some items may interact with your medicine. What should I watch for while using this medicine? Notify your physician immediately if you become short of breath while doing your normal activities. Do not take this medicine within 6 hours of bedtime. It can keep you  from getting to sleep. Avoid drinks that contain caffeine and try to stick to a regular bedtime every night. This medicine was intended to be used in addition to a healthy diet and exercise. The best results are achieved this way. This medicine is only indicated for short-term use. Eventually your weight loss may level out. At that point, the drug will only help you maintain your new weight. Do not increase or in any way change your dose without consulting your doctor. You may get drowsy or dizzy. Do not drive, use machinery, or do anything that needs mental alertness until you know how this medicine affects you. Do not stand or sit up quickly, especially if you are an older patient. This reduces the risk of dizzy or fainting spells. Alcohol may increase dizziness and drowsiness. Avoid alcoholic drinks. What side effects may I notice from receiving this medicine? Side effects that you should report to your doctor or health care professional as soon as possible:  allergic reactions like skin rash, itching or hives, swelling of the face, lips, or tongue)  anxiety  breathing problems  changes in vision  chest pain or chest tightness  depressed  mood or other mood changes  hallucinations, loss of contact with reality  fast, irregular heartbeat  increased blood pressure  irritable  nervousness or restlessness  painful urination  palpitations  tremors  trouble sleeping  seizures  signs and symptoms of a stroke like changes in vision; confusion; trouble speaking or understanding; severe headaches; sudden numbness or weakness of the face, arm or leg; trouble walking; dizziness; loss of balance or coordination  unusually weak or tired  vomiting Side effects that usually do not require medical attention (report to your doctor or health care professional if they continue or are bothersome):  constipation or diarrhea  dry mouth  headache  nausea  stomach upset  sweating  This list may not describe all possible side effects. Call your doctor for medical advice about side effects. You may report side effects to FDA at 1-800-FDA-1088. Where should I keep my medicine? Keep out of the reach of children. This medicine can be abused. Keep your medicine in a safe place to protect it from theft. Do not share this medicine with anyone. Selling or giving away this medicine is dangerous and against the law. This medicine may cause accidental overdose and death if taken by other adults, children, or pets. Mix any unused medicine with a substance like cat litter or coffee grounds. Then throw the medicine away in a sealed container like a sealed bag or a coffee can with a lid. Do not use the medicine after the expiration date. Store at room temperature between 20 and 25 degrees C (68 and 77 degrees F). Keep container tightly closed. NOTE: This sheet is a summary. It may not cover all possible information. If you have questions about this medicine, talk to your doctor, pharmacist, or health care provider.  2020 Elsevier/Gold Standard (2017-02-13 08:23:13)

## 2019-03-28 DIAGNOSIS — Z9884 Bariatric surgery status: Secondary | ICD-10-CM | POA: Insufficient documentation

## 2019-03-28 DIAGNOSIS — R635 Abnormal weight gain: Secondary | ICD-10-CM | POA: Insufficient documentation

## 2019-03-29 ENCOUNTER — Ambulatory Visit: Payer: No Typology Code available for payment source | Admitting: Gastroenterology

## 2019-03-29 ENCOUNTER — Encounter: Payer: Self-pay | Admitting: Gastroenterology

## 2019-03-29 ENCOUNTER — Telehealth: Payer: Self-pay | Admitting: Certified Nurse Midwife

## 2019-03-29 ENCOUNTER — Telehealth: Payer: Self-pay

## 2019-03-29 ENCOUNTER — Other Ambulatory Visit: Payer: Self-pay

## 2019-03-29 VITALS — BP 145/81 | HR 84 | Temp 98.4°F | Ht 64.5 in | Wt 222.6 lb

## 2019-03-29 DIAGNOSIS — K760 Fatty (change of) liver, not elsewhere classified: Secondary | ICD-10-CM

## 2019-03-29 MED ORDER — PHENTERMINE HCL 37.5 MG PO TABS: 37.5000 mg | ORAL_TABLET | Freq: Every day | ORAL | 1 refills | Status: DC

## 2019-03-29 NOTE — Telephone Encounter (Signed)
Script printed and faxed.

## 2019-03-29 NOTE — Patient Instructions (Signed)
You are scheduled for a RUQ abdominal US at [facility] outpatient imaging, [facility] LOCATION on Wednesday, July 22nd at 8:45am. Please arrive at 8:30am at the registration desk. You cannot have anything to eat or drink after midnight on Tuesday night.   If you need to reschedule this appointment for any reason, please contact central scheduling at [phone].

## 2019-03-29 NOTE — Progress Notes (Signed)
Gastroenterology Consultation  Referring Provider:     Burnard Hawthorne, FNP Primary Care Physician:  Burnard Hawthorne, FNP Primary Gastroenterologist:  Dr. Allen Norris     Reason for Consultation:     Abnormal liver enzymes        HPI:   [name] is a 53 y.o. y/o female referred for consultation & management of abnormal liver enzymes by Dr. Vidal Schwalbe, Yvetta Coder, FNP. This patient comes in with a history of having abnormal liver enzymes.  The patient reports that she had abnormal liver enzymes in the past before her gastric bypass surgery.  In the latter part of 2016 the patient had elevated AST and ALT with the maximum ALT reaching 102 with the AST maximum reaching 59.  Since then the liver enzymes have been normal except in May and June of this year where the alkaline phosphatase was elevated at 127 in May and 145 in June.  The patient states that she has gained weight recently.  The patient had a CT scan done in February for renal stones that showed her to have a prominent liver with hepatic steatosis and no gallbladder.  The patient denies alcohol abuse.  The patient also had seen me in the past for anemia with an EGD and colonoscopy done in 2018.  The patient's most recent lab work showed her to be slightly anemic with normal iron studies.  Past Medical History:  Diagnosis Date  . Anemia   . Anxiety and depression   . Chronic back pain   . DM2 (diabetes mellitus, type 2) ([place])    improved since gastric bypass  . Headache    stress/sinus - 2-3x/wk  . HLD (hyperlipidemia)   . HTN (hypertension)    improved since gastric bypass  . Hypothyroid   . Left leg weakness    s/p back surgery  . Obesity   . S/P insertion of spinal cord stimulator   . Wears contact lenses     Past Surgical History:  Procedure Laterality Date  . Abd [place] - gallstones  5/03  . BACK SURGERY  2009   decompressive laminectomy  . BARIATRIC SURGERY  03/02/2012   gastric bypass; Dr Darnell Level  .  CHOLECYSTECTOMY  6/03  . COLONOSCOPY WITH PROPOFOL N/A 05/01/2017   Procedure: COLONOSCOPY WITH PROPOFOL;  Surgeon: Lucilla Lame, MD;  Location: [facility];  Service: Gastroenterology;  Laterality: N/A;  . DILATION AND CURETTAGE OF UTERUS  1/05  . ESOPHAGOGASTRODUODENOSCOPY (EGD) WITH PROPOFOL N/A 05/01/2017   Procedure: ESOPHAGOGASTRODUODENOSCOPY (EGD) WITH PROPOFOL;  Surgeon: Lucilla Lame, MD;  Location: [facility];  Service: Gastroenterology;  Laterality: N/A;  . KNEE SURGERY     L - arthroscopic  . LIVER BIOPSY  6/03   fatty liver  . LUMBAR SPINE SURGERY  2015   Dr. Mick Sell  . NASAL SINUS SURGERY    . PANNICULECTOMY  08/2013   [facility], Dr. Chrisandra Carota  . sleep study - apnea  04/2001   improved since gastric bypass    Prior to Admission medications   Medication Sig Start Date End Date Taking? Authorizing Provider  acyclovir (ZOVIRAX) 400 MG tablet Take by mouth.   Yes [provider]  albuterol (PROVENTIL HFA;VENTOLIN HFA) 108 (90 Base) MCG/ACT inhaler Inhale 2 puffs into the lungs every 6 (six) hours as needed for wheezing or shortness of breath. 12/20/18  Yes Arnett, Yvetta Coder, FNP  baclofen (LIORESAL) 10 MG tablet Take 10 mg by mouth daily. 07/28/16  Yes [provider]  buPROPion (WELLBUTRIN XL) 300 MG 24 hr tablet Take 300 mg by mouth daily.   Yes [provider]  fentaNYL (DURAGESIC - DOSED MCG/HR) 50 MCG/HR APPLY 1 PATCH(ES) EVERY 72 HOURS BY TRANSDERMAL ROUTE. 10/08/17  Yes [provider]  gabapentin (NEURONTIN) 400 MG capsule Take 800 mg by mouth 2 (two) times daily.  07/31/16  Yes [provider]  levothyroxine (SYNTHROID) 100 MCG tablet Take 1 tablet (100 mcg total) by mouth daily. 01/14/19  Yes Allegra GranaArnett, Margaret G, FNP  phentermine (ADIPEX-P) 37.5 MG tablet Take 1 tablet (37.5 mg total) by mouth daily before breakfast. 03/29/19  Yes Doreene Burkehompson, Annie, CNM  promethazine (PHENERGAN) 12.5 MG tablet  10/26/18  Yes [provider]  ferrous sulfate 325 (65 FE) MG EC tablet Take 1 tablet (325 mg total) by mouth 3 (three) times daily with meals. Patient not taking: Reported on 03/25/2019 01/19/19   Allegra GranaArnett, Margaret G, FNP    Family History  Problem Relation Age of Onset  . Lung cancer Father   . Diabetes Mother   . Heart failure Mother   . Diabetes Sister   . Fibromyalgia Sister   . Colon cancer Maternal Aunt   . Skin cancer Paternal Aunt   . Skin cancer Paternal Uncle      Social History   Tobacco Use  . Smoking status: Never Smoker  . Smokeless tobacco: Never Used  . Tobacco comment: tobacco use - no  Substance Use Topics  . Alcohol use: No  . Drug use: No    Allergies as of 03/29/2019 - Review Complete 03/29/2019  Allergen Reaction Noted  . Latex Rash 03/31/2018  . Glipizide Nausea And Vomiting 05/18/2007  . Hydrocodone-acetaminophen Itching 01/01/2017  . Lyrica [pregabalin]  11/22/2014  . Metformin Diarrhea 05/18/2007  . Penicillins Itching and Swelling 12/28/2007  . Adhesive [tape] Rash 08/19/2016    Review of Systems:    All systems reviewed and negative except where noted in HPI.   Physical Exam:  BP (!) 145/81   Pulse 84   Temp 98.4 F (36.9 C) (Oral)   Ht 5' 4.5" (1.638 m)   Wt 222 lb 9.6 oz (101 kg)   LMP 12/16/2015   BMI 37.62 kg/m  Patient's last menstrual period was 12/16/2015. General:   Alert,  Well-developed, well-nourished, pleasant and cooperative in NAD Head:  Normocephalic and atraumatic. Eyes:  Sclera clear, no icterus.   Conjunctiva pink. Ears:  Normal auditory acuity. Nose:  No deformity, discharge, or lesions. Mouth:  No deformity or lesions,oropharynx pink & moist. Neck:  Supple; no masses or thyromegaly. Lungs:  Respirations even and unlabored.  Clear throughout to auscultation.   No wheezes, crackles, or rhonchi. No acute distress. Heart:  Regular rate and rhythm; no murmurs, clicks, rubs, or gallops. Abdomen:  Normal bowel sounds.  No bruits.   Soft, non-tender and non-distended without masses, hepatosplenomegaly or hernias noted.  No guarding or rebound tenderness.  Negative Carnett sign.   Rectal:  Deferred.  Msk:  Symmetrical without gross deformities.  Good, equal movement & strength bilaterally. Pulses:  Normal pulses noted. Extremities:  No clubbing or edema.  No cyanosis. Neurologic:  Alert and oriented x3;  grossly normal neurologically. Skin:  Intact without significant lesions or rashes.  No jaundice. Lymph Nodes:  No significant cervical adenopathy. Psych:  Alert and cooperative. Normal mood and affect.  Imaging Studies: No results found.  Assessment and Plan:   Benjamin StainMelissa Carol Lorio is a  53 y.o. y/o female who comes in today with a history of fatty liver on a CT scan done for renal stones.  The patient also has an isolated increased alkaline phosphatase.  The patient does endorse that she has gained weight since having a large amount of weight loss after having her gastric bypass surgery.  The patient will have lab sent off for other possible causes of abnormal liver enzymes.  The patient will have her hepatitis A total antibody checked since she only had an IgM done in the past.  She is also not immune to hepatitis B and may need a vaccination for both hepatitis A and B.  The patient has been told to try and lose weight.  Life-style modification to include routine physical activity of at least 20-30 minutes daily as well as dietary modification was discussed. Specifically a Mediterranean style diet which is composed of fish, white meat, fresh fruit, vegetable, whole grains, and legumes was recommended. Foods enriched with excess refined sugars, such as high fructose corn syrup, sweetened beveraages and hydrogenated fats ( i,e. the "Western Diet") should be avoided. A higher protein, lower carbohydrate, lower fat diet is ideal in decreasing excess hydrogenated fats and complex sugars. Coffee consumption has been proven to be  beneficial in improving liver injury. Routine physical activity which results in 3-5% weight loss improves insulin resistance and hepatic steatosis. Weight loss of 5-10% may improve nonalcoholic steatohepatisis or even hepatic fibrosis. Both diet and exercise are the foundation of treatment for patients with NAFLD.   The patient has been explained the plan and agrees with it.   Midge Miniumarren Marisel Tostenson, MD. Clementeen GrahamFACG    Note: This dictation was prepared with Dragon dictation along with smaller phrase technology. Any transcriptional errors that result from this process are unintentional.

## 2019-03-29 NOTE — Telephone Encounter (Signed)
The patient called and stated that she was advised by Sharyn Lull to call and speak to [facility] about her prescription decision. The patient did not disclose any other information. Pt requesting a call back. Please advise.

## 2019-03-30 ENCOUNTER — Telehealth: Payer: Self-pay | Admitting: Certified Nurse Midwife

## 2019-03-30 NOTE — Telephone Encounter (Signed)
The patient called and stated that her prescription is not at her pharmacy, I informed the patient the prescription was sent in yesterday, The patient is requesting that the medication be sent in again. Please advise.

## 2019-03-31 LAB — ANA: Anti Nuclear Antibody (ANA): NEGATIVE

## 2019-03-31 LAB — HEPATITIS A ANTIBODY, TOTAL: hep A Total Ab: NEGATIVE

## 2019-03-31 LAB — HEPATIC FUNCTION PANEL
ALT: 28 IU/L (ref 0–32)
AST: 27 IU/L (ref 0–40)
Albumin: 4.1 g/dL (ref 3.8–4.9)
Alkaline Phosphatase: 162 IU/L — ABNORMAL HIGH (ref 39–117)
Bilirubin Total: <0.2 mg/dL (ref 0.0–1.2)
Bilirubin, Direct: 0.06 mg/dL (ref 0.00–0.40)
Total Protein: 6.8 g/dL (ref 6.0–8.5)

## 2019-03-31 LAB — ANTI-SMOOTH MUSCLE ANTIBODY, IGG: Smooth Muscle Ab: 7 Units (ref 0–19)

## 2019-03-31 LAB — MITOCHONDRIAL ANTIBODIES: Mitochondrial Ab: <20 Units (ref 0.0–20.0)

## 2019-03-31 LAB — CERULOPLASMIN: Ceruloplasmin: 25.5 mg/dL (ref 19.0–39.0)

## 2019-03-31 LAB — HEPATITIS B SURFACE ANTIGEN: Hepatitis B Surface Ag: NEGATIVE

## 2019-03-31 LAB — ALPHA-1-ANTITRYPSIN: A-1 Antitrypsin: 134 mg/dL (ref 101–187)

## 2019-04-01 ENCOUNTER — Telehealth: Payer: Self-pay

## 2019-04-01 NOTE — Telephone Encounter (Signed)
Detailed message left re: patient needs to let us know who she will be seeing for weight loss. If AT she will need a weight loss consultation appointment before any prescriptions will be ordered. If ML- ML will be back in office 04/04/19 to fill prescription Several mychart messages were sent to patient but she did not read them.

## 2019-04-05 ENCOUNTER — Telehealth: Payer: Self-pay

## 2019-04-05 NOTE — Telephone Encounter (Signed)
Left vm for a return call for her lab results. Also sent a message to pt via mychart with results as well.

## 2019-04-05 NOTE — Telephone Encounter (Signed)
-----   Message from Lucilla Lame, MD sent at 04/04/2019  7:05 AM EDT ----- Let the patient know that her alkaline phosphatase remains to be elevated while all the other labs are normal.  The patient will still need vaccinations for her hepatitis A and hepatitis B.  The patient should also have her alkaline phosphatase fractionated to see if it is from the liver or the bone and should also have a GGT sent off.

## 2019-04-06 ENCOUNTER — Other Ambulatory Visit: Payer: Self-pay

## 2019-04-06 ENCOUNTER — Ambulatory Visit
Admission: RE | Admit: 2019-04-06 | Discharge: 2019-04-06 | Disposition: A | Discharge status: Home / Self Care | Payer: No Typology Code available for payment source | Source: Ambulatory Visit | Attending: Gastroenterology | Admitting: Gastroenterology

## 2019-04-06 DIAGNOSIS — K760 Fatty (change of) liver, not elsewhere classified: Secondary | ICD-10-CM | POA: Insufficient documentation

## 2019-04-08 LAB — SPECIMEN STATUS REPORT

## 2019-04-08 LAB — ALKALINE PHOSPHATASE, ISOENZYMES
Alkaline Phosphatase: 157 IU/L — ABNORMAL HIGH (ref 39–117)
BONE FRACTION: 54 % (ref 14–68)
INTESTINAL FRAC.: 0 % (ref 0–18)
LIVER FRACTION: 46 % (ref 18–85)

## 2019-04-08 LAB — GAMMA GT: GGT: 17 IU/L (ref 0–60)

## 2019-04-11 ENCOUNTER — Ambulatory Visit (INDEPENDENT_AMBULATORY_CARE_PROVIDER_SITE_OTHER): Payer: Self-pay | Admitting: Gastroenterology

## 2019-04-11 ENCOUNTER — Other Ambulatory Visit: Payer: Self-pay

## 2019-04-11 DIAGNOSIS — Z23 Encounter for immunization: Secondary | ICD-10-CM

## 2019-04-11 NOTE — Progress Notes (Signed)
Pt was seen today for her first Twinrix vaccine. Vaccine was administered in pt's right deltoid. Pt tolerated well. GF

## 2019-04-11 NOTE — Patient Instructions (Signed)
Hepatitis B Vaccine: What You Need to Know 1. Why get vaccinated? Hepatitis B vaccine can prevent hepatitis B. Hepatitis B is a liver disease that can cause mild illness lasting a few weeks, or it can lead to a serious, lifelong illness.  Acute hepatitis B infection is a short-term illness that can lead to fever, fatigue, loss of appetite, nausea, vomiting, jaundice (yellow skin or eyes, dark urine, clay-colored bowel movements), and pain in the muscles, joints, and stomach.  Chronic hepatitis B infection is a long-term illness that occurs when the hepatitis B virus remains in a person's body. Most people who go on to develop chronic hepatitis B do not have symptoms, but it is still very serious and can lead to liver damage (cirrhosis), liver cancer, and death. Chronically-infected people can spread hepatitis B virus to others, even if they do not feel or look sick themselves. Hepatitis B is spread when blood, semen, or other body fluid infected with the hepatitis B virus enters the body of a person who is not infected. People can become infected through:  Birth (if a mother has hepatitis B, her baby can become infected)  Sharing items such as razors or toothbrushes with an infected person  Contact with the blood or open sores of an infected person  Sex with an infected partner  Sharing needles, syringes, or other drug-injection equipment  Exposure to blood from needlesticks or other sharp instruments Most people who are vaccinated with hepatitis B vaccine are immune for life. 2. Hepatitis B vaccine Hepatitis B vaccine is usually given as 2, 3, or 4 shots. Infants should get their first dose of hepatitis B vaccine at birth and will usually complete the series at 23 months of age (sometimes it will take longer than 6 months to complete the series). Children and adolescents younger than 73 years of age who have not yet gotten the vaccine should also be vaccinated. Hepatitis B vaccine is also  recommended for certain unvaccinated adults:  People whose sex partners have hepatitis B  Sexually active persons who are not in a long-term monogamous relationship  Persons seeking evaluation or treatment for a sexually transmitted disease  Men who have sexual contact with other men  People who share needles, syringes, or other drug-injection equipment  People who have household contact with someone infected with the hepatitis B virus  Health care and public safety workers at risk for exposure to blood or body fluids  Residents and staff of facilities for developmentally disabled persons  Persons in correctional facilities  Victims of sexual assault or abuse  Travelers to regions with increased rates of hepatitis B  People with chronic liver disease, kidney disease, HIV infection, infection with hepatitis C, or diabetes  Anyone who wants to be protected from hepatitis B Hepatitis B vaccine may be given at the same time as other vaccines. 3. Talk with your health care provider Tell your vaccine provider if the person getting the vaccine:  Has had an allergic reaction after a previous dose of hepatitis B vaccine, or has any severe, life-threatening allergies. In some cases, your health care provider may decide to postpone hepatitis B vaccination to a future visit. People with minor illnesses, such as a cold, may be vaccinated. People who are moderately or severely ill should usually wait until they recover before getting hepatitis B vaccine. Your health care provider can give you more information. 4. Risks of a vaccine reaction  Soreness where the shot is given or fever can  happen after hepatitis B vaccine. People sometimes faint after medical procedures, including vaccination. Tell your provider if you feel dizzy or have vision changes or ringing in the ears. As with any medicine, there is a very remote chance of a vaccine causing a severe allergic reaction, other serious  injury, or death. 5. What if there is a serious problem? An allergic reaction could occur after the vaccinated person leaves the clinic. If you see signs of a severe allergic reaction (hives, swelling of the face and throat, difficulty breathing, a fast heartbeat, dizziness, or weakness), call 9-1-1 and get the person to the nearest hospital. For other signs that concern you, call your health care provider. Adverse reactions should be reported to the Vaccine Adverse Event Reporting System (VAERS). Your health care provider will usually file this report, or you can do it yourself. Visit the VAERS website at www.vaers.SamedayNews.es or call 630 474 3179.VAERS is only for reporting reactions, and VAERS staff do not give medical advice. 6. The National Vaccine Injury Compensation Program The [employer] Vaccine Injury Compensation Program (VICP) is a federal program that was created to compensate people who may have been injured by certain vaccines. Visit the VICP website at GoldCloset.com.ee or call 5794243890 to learn about the program and about filing a claim. There is a time limit to file a claim for compensation. 7. How can I learn more?  Ask your healthcare provider.  Call your local or state health department.  Contact the Centers for Disease Control and Prevention (CDC): ? Call 931-397-3272 (1-800-CDC-INFO) or ? Visit CDC's http://hunter.com/ CDC Vaccine Information Statement (Interim) Hepatitis B Vaccine (04/29/2018) This information is not intended to replace advice given to you by your health care provider. Make sure you discuss any questions you have with your health care provider. Document Released: 06/26/2006 Document Revised: 12/21/2018 Document Reviewed: 05/05/2018 Elsevier Patient Education  2020 [employer].

## 2019-04-12 ENCOUNTER — Telehealth: Payer: Self-pay

## 2019-04-12 ENCOUNTER — Other Ambulatory Visit: Payer: Self-pay

## 2019-04-12 DIAGNOSIS — R748 Abnormal levels of other serum enzymes: Secondary | ICD-10-CM

## 2019-04-12 NOTE — Telephone Encounter (Signed)
-----   Message from Lucilla Lame, MD sent at 04/08/2019 11:49 AM EDT ----- Let the patient know that her ultrasound only showed fatty liver and her increased alkaline phosphatase showed a significant portion from the bone more than the liver.

## 2019-04-12 NOTE — Telephone Encounter (Signed)
Pt notified of lab and US results.  

## 2019-04-18 LAB — MULTIPLE MYELOMA PANEL, SERUM
Albumin SerPl Elph-Mcnc: 3.8 g/dL (ref 2.9–4.4)
Albumin/Glob SerPl: 1.3 (ref 0.7–1.7)
Alpha 1: 0.2 g/dL (ref 0.0–0.4)
Alpha2 Glob SerPl Elph-Mcnc: 0.9 g/dL (ref 0.4–1.0)
B-Globulin SerPl Elph-Mcnc: 0.9 g/dL (ref 0.7–1.3)
Gamma Glob SerPl Elph-Mcnc: 1.1 g/dL (ref 0.4–1.8)
Globulin, Total: 3 g/dL (ref 2.2–3.9)
IgA/Immunoglobulin A, Serum: 176 mg/dL (ref 87–352)
IgG (Immunoglobin G), Serum: 1135 mg/dL (ref 586–1602)
IgM (Immunoglobulin M), Srm: 152 mg/dL (ref 26–217)
Total Protein: 6.8 g/dL (ref 6.0–8.5)

## 2019-04-18 LAB — HCV FIBROSURE
ALPHA 2-MACROGLOBULINS, QN: 380 mg/dL — ABNORMAL HIGH (ref 110–276)
ALT (SGPT) P5P: 39 IU/L (ref 0–40)
Apolipoprotein A-1: 133 mg/dL (ref 116–209)
Bilirubin, Total: 0.2 mg/dL (ref 0.0–1.2)
Fibrosis Score: 0.31 — ABNORMAL HIGH (ref 0.00–0.21)
GGT: 23 IU/L (ref 0–60)
Haptoglobin: 111 mg/dL (ref 33–346)
Necroinflammat Activity Score: 0.21 — ABNORMAL HIGH (ref 0.00–0.17)

## 2019-04-18 LAB — PTH, INTACT AND CALCIUM: Calcium: 9 mg/dL (ref 8.7–10.2)

## 2019-04-20 ENCOUNTER — Ambulatory Visit (INDEPENDENT_AMBULATORY_CARE_PROVIDER_SITE_OTHER): Payer: Self-pay | Admitting: Family

## 2019-04-20 ENCOUNTER — Other Ambulatory Visit: Payer: Self-pay

## 2019-04-20 ENCOUNTER — Encounter: Payer: Self-pay | Admitting: Family

## 2019-04-20 DIAGNOSIS — F418 Other specified anxiety disorders: Secondary | ICD-10-CM

## 2019-04-20 DIAGNOSIS — R519 Headache, unspecified: Secondary | ICD-10-CM

## 2019-04-20 DIAGNOSIS — K760 Fatty (change of) liver, not elsewhere classified: Secondary | ICD-10-CM

## 2019-04-20 DIAGNOSIS — R51 Headache: Secondary | ICD-10-CM

## 2019-04-20 MED ORDER — PROPRANOLOL HCL 20 MG PO TABS: 20.0000 mg | ORAL_TABLET | Freq: Two times a day (BID) | ORAL | 0 refills | Status: DC

## 2019-04-20 MED ORDER — BUPROPION HCL ER (XL) 150 MG PO TB24: 150.0000 mg | ORAL_TABLET | Freq: Every day | ORAL | 0 refills | Status: DC

## 2019-04-20 NOTE — Progress Notes (Signed)
This visit type was conducted due to national recommendations for restrictions regarding the COVID-19 pandemic (e.g. social distancing).  This format is felt to be most appropriate for this patient at this time.  All issues noted in this document were discussed and addressed.  No physical exam was performed (except for noted visual exam findings with Video Visits). Virtual Visit via Video Note  I connected with@  on 04/20/19 at  3:00 PM EDT by a video enabled telemedicine application and verified that I am speaking with the correct person using two identifiers.  Location patient: home Location provider:work  Persons participating in the virtual visit: patient, provider  I discussed the limitations of evaluation and management by telemedicine and the availability of in person appointments. The patient expressed understanding and agreed to proceed.   HPI:  Multiple complaints.   Headache- past 2 months, every day, taking Excredin daily with only 2-3 hours relief. Describes as 'dull, aggravating.' Starts occipital/ neck area and 'works its way forward.' No HA history.  Not the worse HA of life.  No sinus congestion, sudden vision changes, N,V.  Every so often feels sensitive to light and sound. HA is not positional. Doesn't awaken her from sleep. No h/o breast cancer.   Due for eye exam.   Home currently. Not a lot of screen use.   Hard time going and staying asleep. Waking up tired. Drinking ginger ale. NO water or caffeine.   Increased anxiety- tried zoloft, celexa. On wellbutrin, cymbalta.  Looking for job. Stress as living in an apartment and doesn't like apartment. New house however may take 4-6 months.  No si/hi.   Hasnt started phentermine.   Dr Allen Norris 03/29/19- fatty liver - hep a and b vaccines. Labs done. Alk phos continues to be elevated.   Chronic pain- follows with Dr Vivien Rota  ROS: See pertinent positives and negatives per HPI.  Past Medical History:  Diagnosis Date   . Anemia   . Anxiety and depression   . Chronic back pain   . DM2 (diabetes mellitus, type 2) ([place])    improved since gastric bypass  . Headache    stress/sinus - 2-3x/wk  . HLD (hyperlipidemia)   . HTN (hypertension)    improved since gastric bypass  . Hypothyroid   . Left leg weakness    s/p back surgery  . Obesity   . S/P insertion of spinal cord stimulator   . Wears contact lenses     Past Surgical History:  Procedure Laterality Date  . Abd [place] - gallstones  5/03  . BACK SURGERY  2009   decompressive laminectomy  . BARIATRIC SURGERY  03/02/2012   gastric bypass; Dr Darnell Level  . CHOLECYSTECTOMY  6/03  . COLONOSCOPY WITH PROPOFOL N/A 05/01/2017   Procedure: COLONOSCOPY WITH PROPOFOL;  Surgeon: Lucilla Lame, MD;  Location: [facility];  Service: Gastroenterology;  Laterality: N/A;  . DILATION AND CURETTAGE OF UTERUS  1/05  . ESOPHAGOGASTRODUODENOSCOPY (EGD) WITH PROPOFOL N/A 05/01/2017   Procedure: ESOPHAGOGASTRODUODENOSCOPY (EGD) WITH PROPOFOL;  Surgeon: Lucilla Lame, MD;  Location: [facility];  Service: Gastroenterology;  Laterality: N/A;  . KNEE SURGERY     L - arthroscopic  . LIVER BIOPSY  6/03   fatty liver  . LUMBAR SPINE SURGERY  2015   Dr. Mick Sell  . NASAL SINUS SURGERY    . PANNICULECTOMY  08/2013   [facility], Dr. Chrisandra Carota  . sleep study - apnea  04/2001   improved since gastric bypass  Family History  Problem Relation Age of Onset  . Lung cancer Father   . Diabetes Mother   . Heart failure Mother   . Diabetes Sister   . Fibromyalgia Sister   . Colon cancer Maternal Aunt   . Skin cancer Paternal Aunt   . Skin cancer Paternal Uncle     SOCIAL HX: never smoker   Current Outpatient Medications:  .  acyclovir (ZOVIRAX) 400 MG tablet, Take by mouth., Disp: , Rfl:  .  albuterol (PROVENTIL HFA;VENTOLIN HFA) 108 (90 Base) MCG/ACT inhaler, Inhale 2 puffs into the lungs every 6 (six) hours as needed for wheezing or shortness of breath., Disp: 1  Inhaler, Rfl: 0 .  baclofen (LIORESAL) 10 MG tablet, Take 10 mg by mouth daily., Disp: , Rfl:  .  DULoxetine (CYMBALTA) 60 MG capsule, Take 60 mg by mouth daily., Disp: , Rfl:  .  fentaNYL (DURAGESIC - DOSED MCG/HR) 50 MCG/HR, APPLY 1 PATCH(ES) EVERY 72 HOURS BY TRANSDERMAL ROUTE., Disp: , Rfl: 0 .  gabapentin (NEURONTIN) 400 MG capsule, Take 800 mg by mouth 2 (two) times daily. , Disp: , Rfl:  .  levothyroxine (SYNTHROID) 100 MCG tablet, Take 1 tablet (100 mcg total) by mouth daily., Disp: 90 tablet, Rfl: 1 .  phentermine (ADIPEX-P) 37.5 MG tablet, Take 1 tablet (37.5 mg total) by mouth daily before breakfast., Disp: 30 tablet, Rfl: 1 .  promethazine (PHENERGAN) 12.5 MG tablet, , Disp: , Rfl:  .  buPROPion (WELLBUTRIN XL) 150 MG 24 hr tablet, Take 1 tablet (150 mg total) by mouth daily. Start 150 mg ER PO qam, increase after 3 days to 300 mg qam., Disp: 30 tablet, Rfl: 0 .  propranolol (INDERAL) 20 MG tablet, Take 1 tablet (20 mg total) by mouth 2 (two) times daily., Disp: 120 tablet, Rfl: 0  EXAM:  VITALS per patient if applicable:  BP Readings from Last 3 Encounters:  03/29/19 (!) 145/81  03/25/19 131/75  03/14/19 119/82    GENERAL: alert, oriented, appears well and in no acute distress  HEENT: atraumatic, conjunttiva clear, no obvious abnormalities on inspection of external nose and ears  NECK: normal movements of the head and neck  LUNGS: on inspection no signs of respiratory distress, breathing rate appears normal, no obvious gross SOB, gasping or wheezing  CV: no obvious cyanosis  MS: moves all visible extremities without noticeable abnormality  PSYCH/NEURO: pleasant and cooperative, no obvious depression or anxiety, speech and thought processing grossly intact  ASSESSMENT AND PLAN:  Discussed the following assessment and plan:  Problem List Items Addressed This Visit      Digestive   Fatty liver    Following with Dr Allen Norris; advised patient to continue doing so. She  understands to continue to follow with him, particularly as alk pho remains elevated.         Other   Depression with anxiety - Primary    Depression screen [facility] 2/9 04/20/2019 04/22/2017 01/20/2017  Decreased Interest 3 0 0  Down, Depressed, Hopeless 1 0 0  PHQ - 2 Score 4 0 0  Altered sleeping 3 - -  Tired, decreased energy 3 - -  Change in appetite 1 - -  Feeling bad or failure about yourself  2 - -  Trouble concentrating 3 - -  Moving slowly or fidgety/restless 1 - -  Suicidal thoughts 0 - -  PHQ-9 Score 17 - -  Difficult doing work/chores Somewhat difficult - -   Recent stressors at home. Suspect  wellbutrin may be contributing to increased anxiety, potentially HA ,and trouble sleeping. Irregardless, doesn't appear medication is overtly helpful so will wean off. Will trial propranolol for HA PPX and anxiety. Discussed trazodone as well.       Relevant Medications   propranolol (INDERAL) 20 MG tablet   buPROPion (WELLBUTRIN XL) 150 MG 24 hr tablet   DULoxetine (CYMBALTA) 60 MG capsule   Headache    Suspect poor sleeping pattern, hydration, medication overuse, and increased stress may be contributory.  Once off wellbutrin, will trial propranolol. Patient will let me know if no improvement and make sooner appointment if needed.  Close follow up one month.       Relevant Medications   propranolol (INDERAL) 20 MG tablet   buPROPion (WELLBUTRIN XL) 150 MG 24 hr tablet   DULoxetine (CYMBALTA) 60 MG capsule        I discussed the assessment and treatment plan with the patient. The patient was provided an opportunity to ask questions and all were answered. The patient agreed with the plan and demonstrated an understanding of the instructions.   The patient was advised to call back or seek an in-person evaluation if the symptoms worsen or if the condition fails to improve as anticipated.   Mable Paris, FNP

## 2019-04-20 NOTE — Assessment & Plan Note (Addendum)
Suspect poor sleeping pattern, hydration, medication overuse, and increased stress may be contributory.  Once off wellbutrin, will trial propranolol. Patient will let me know if no improvement and make sooner appointment if needed.  Close follow up one month.

## 2019-04-20 NOTE — Patient Instructions (Addendum)
For headache,  Aim for more sleep, WATER, and STOP Excredin as may be causing rebound headache.   Please make eye exam as this can contribute to headaches.   We will wean of wellbutrin. Start 126m wellbutrin for one week. The following week, take every other day for one week, then may stop. Notice if less irritable and if sleep improves OFF this medication.   Start propranolol for headache prevention and anxiety AFTER OFF of the wellbutrin.   It is very important that you to Continue to follow with Dr WAllen Norrisfor elevated alk phos in your labs. If continues to be elevated, I would advise you to have consult with hematology/oncology.   Close follow up in one month.   Stay safe!    General Headache Without Cause A headache is pain or discomfort felt around the head or neck area. The specific cause of a headache may not be found. There are many causes and types of headaches. A few common ones are:  Tension headaches.  Migraine headaches.  Cluster headaches.  Chronic daily headaches. Follow these instructions at home: Watch your condition for any changes. Let your health care provider know about them. Take these steps to help with your condition: Managing pain      Take over-the-counter and prescription medicines only as told by your health care provider.  Lie down in a dark, quiet room when you have a headache.  If directed, put ice on your head and neck area: ? Put ice in a plastic bag. ? Place a towel between your skin and the bag. ? Leave the ice on for 20 minutes, 2-3 times per day.  If directed, apply heat to the affected area. Use the heat source that your health care provider recommends, such as a moist heat pack or a heating pad. ? Place a towel between your skin and the heat source. ? Leave the heat on for 20-30 minutes. ? Remove the heat if your skin turns bright red. This is especially important if you are unable to feel pain, heat, or cold. You may have a greater  risk of getting burned.  Keep lights dim if bright lights bother you or make your headaches worse. Eating and drinking  Eat meals on a regular schedule.  If you drink alcohol: ? Limit how much you use to:  0-1 drink a day for women.  0-2 drinks a day for men. ? Be aware of how much alcohol is in your drink. In the [place] one drink equals one 12 oz bottle of beer (355 mL), one 5 oz glass of wine (148 mL), or one 1 oz glass of hard liquor (44 mL).  Stop drinking caffeine, or decrease the amount of caffeine you drink. General instructions   Keep a headache journal to help find out what may trigger your headaches. For example, write down: ? What you eat and drink. ? How much sleep you get. ? Any change to your diet or medicines.  Try massage or other relaxation techniques.  Limit stress.  Sit up straight, and do not tense your muscles.  Do not use any products that contain nicotine or tobacco, such as cigarettes, e-cigarettes, and chewing tobacco. If you need help quitting, ask your health care provider.  Exercise regularly as told by your health care provider.  Sleep on a regular schedule. Get 7-9 hours of sleep each night, or the amount recommended by your health care provider.  Keep all follow-up visits as told by  your health care provider. This is important. Contact a health care provider if:  Your symptoms are not helped by medicine.  You have a headache that is different from the usual headache.  You have nausea or you vomit.  You have a fever. Get help right away if:  Your headache becomes severe quickly.  Your headache gets worse after moderate to intense physical activity.  You have repeated vomiting.  You have a stiff neck.  You have a loss of vision.  You have problems with speech.  You have pain in the eye or ear.  You have muscular weakness or loss of muscle control.  You lose your balance or have trouble walking.  You feel faint or pass  out.  You have confusion.  You have a seizure. Summary  A headache is pain or discomfort felt around the head or neck area.  There are many causes and types of headaches. In some cases, the cause may not be found.  Keep a headache journal to help find out what may trigger your headaches. Watch your condition for any changes. Let your health care provider know about them.  Contact a health care provider if you have a headache that is different from the usual headache, or if your symptoms are not helped by medicine.  Get help right away if your headache becomes severe, you vomit, you have a loss of vision, you lose your balance, or you have a seizure. This information is not intended to replace advice given to you by your health care provider. Make sure you discuss any questions you have with your health care provider. Document Released: 09/01/2005 Document Revised: 03/22/2018 Document Reviewed: 03/22/2018 Elsevier Patient Education  2020 [employer].

## 2019-04-20 NOTE — Assessment & Plan Note (Signed)
Depression screen [facility] 2/9 04/20/2019 04/22/2017 01/20/2017  Decreased Interest 3 0 0  Down, Depressed, Hopeless 1 0 0  PHQ - 2 Score 4 0 0  Altered sleeping 3 - -  Tired, decreased energy 3 - -  Change in appetite 1 - -  Feeling bad or failure about yourself  2 - -  Trouble concentrating 3 - -  Moving slowly or fidgety/restless 1 - -  Suicidal thoughts 0 - -  PHQ-9 Score 17 - -  Difficult doing work/chores Somewhat difficult - -   Recent stressors at home. Suspect wellbutrin may be contributing to increased anxiety, potentially HA ,and trouble sleeping. Irregardless, doesn't appear medication is overtly helpful so will wean off. Will trial propranolol for HA PPX and anxiety. Discussed trazodone as well.

## 2019-04-20 NOTE — Assessment & Plan Note (Signed)
Following with Dr Allen Norris; advised patient to continue doing so. She understands to continue to follow with him, particularly as alk pho remains elevated.

## 2019-04-21 ENCOUNTER — Telehealth: Payer: Self-pay

## 2019-04-21 NOTE — Telephone Encounter (Signed)
Pt notified of results

## 2019-04-21 NOTE — Telephone Encounter (Signed)
-----  Message from Jonathon Bellows, MD sent at 04/21/2019 11:43 AM EDT ----- Regarding: results [name] I reviewed the results  The PTH,Ca are normal suggesting nothing much going on in the bones  The fibrosure shows some fibrosis and inflammatory activity , since GGT not elevated likely no inflammation from the liver contributing to elevayion .   Option is  to just watch the Alk phos Q 6 monthly and if rises then evaluate . At next lab draw suggest checking Alk phos in the am before eating as eating can increase the levels if checked right after  C/c Dr Orson Gear

## 2019-04-22 ENCOUNTER — Telehealth: Payer: Self-pay | Admitting: Family

## 2019-04-22 ENCOUNTER — Ambulatory Visit: Payer: No Typology Code available for payment source | Admitting: Certified Nurse Midwife

## 2019-04-22 ENCOUNTER — Encounter: Payer: Self-pay | Admitting: Certified Nurse Midwife

## 2019-04-22 ENCOUNTER — Other Ambulatory Visit: Payer: Self-pay

## 2019-04-22 VITALS — BP 115/75 | HR 78 | Ht 64.5 in | Wt 218.4 lb

## 2019-04-22 DIAGNOSIS — E669 Obesity, unspecified: Secondary | ICD-10-CM

## 2019-04-22 DIAGNOSIS — Z6836 Body mass index (BMI) 36.0-36.9, adult: Secondary | ICD-10-CM

## 2019-04-22 DIAGNOSIS — Z7689 Persons encountering health services in other specified circumstances: Secondary | ICD-10-CM

## 2019-04-22 MED ORDER — CYANOCOBALAMIN 1000 MCG/ML IJ SOLN: 1000.0000 ug | Freq: Once | INTRAMUSCULAR | 6 refills | Status: AC

## 2019-04-22 MED ORDER — CYANOCOBALAMIN 1000 MCG/ML IJ SOLN
1000.0000 ug | Freq: Once | INTRAMUSCULAR | Status: AC
  Administered 2019-04-22: 1000 ug via INTRAMUSCULAR

## 2019-04-22 MED ORDER — PHENTERMINE HCL 37.5 MG PO CAPS: 37.5000 mg | ORAL_CAPSULE | ORAL | 0 refills | Status: DC

## 2019-04-22 NOTE — Telephone Encounter (Signed)
Call pt  I have been thinking since I spoke with patient on Weds.   In regards to her elevated alkaline phosphatase.  This does appear to be more of BONE etiology based on labs by Dr Allen Norris.    In this setting, we typical pursue bone density scan to look for osteopenia or osteoporosis.  Typically if we see chronic alk phos elevations, I also refer  patient hematology as well.   I do not want to overwhelm patient however I just want make sure safe.    If she willing at all to see hematology in regards to this?   Can we order bone density?   Please let me know.

## 2019-04-22 NOTE — Progress Notes (Signed)
Subjective:  [name] is a 53 y.o. G0P0000 at [facility] being seen today for weight loss management- initial visit.  Patient reports General ROS: negative and reports previous weight loss attempts:   Onset was gradual,  months/year(s) ago. She states she had gastric bypass surgery 2013 and lost over 100 lbs, she has gained back 40-50 lbs . She has had 3 back surgeries and has a broken arm that has contributed to her weight gain Onset followed:  She has had 3 back surgeries and has a broken arm that has contributed to her weight gain Associated symptoms include: fatigue, depression, change in clothing fit and menstrual changes. Previous/Current treatment includes:Gastric bypass surgery 2013-lost 100 lbs.   Pertinent medical history includes: anxiety and depression, chronic back pain, type 2 diabeties ( improved with gastric bypass). Hypothyroid.   Risk factors include: none  The patient has a surgical history of:  bariatric surgery, Back surgery x 3.  Pertinent social history includes:In process of new house.   Past evaluation has included: metabolic profile, hemoglobin A1c, thyroid panel.  Past treatment has included: gastric bypass   psychiatrist care, antidepressant,exercise management   The following portions of the patient's history were reviewed and updated as appropriate: allergies, current medications, past family history, past medical history, past social history, past surgical history and problem list.   Objective:   Vitals:   04/22/19 0834  BP: 115/75  Pulse: 78  Weight: 218 lb 6 oz (99.1 kg)  Height: 5' 4.5" (1.638 m)    General:  Alert, oriented and cooperative. Patient is in no acute distress.  :   :   :   :   :   :   PE: Well groomed female in no current distress,   Mental Status: Normal mood and affect. Normal behavior. Normal judgment and thought content.   Current BMI: Body mass index is 36.91 kg/m.   Assessment and Plan:  Obesity   Plan: low  carb, High protein diet , counting calories. Exercise 3-4 times weekly for 30-45 min.  RX for adipex 37.5 mg daily and B12 1092mcg.ml monthly, to start now with first injection given at today's visit. Reviewed side-effects common to both medications and expected outcomes. Increase daily water intake to at least 8 bottle a day, every day.  Goal is to reduse weight by 10% by end of three months, and will re-evaluate then.  RTC in 4 weeks for Nurse visit to check weight & BP, and get next B12 injections.    Please refer to After Visit Summary for other counseling recommendations.    Philip Aspen, CNM  I attest more than 50% of visit spent reviewing history, discussing plan of care, reviewing diet, exercise, medications risks and benefits, Discussing red flag symptoms and answering all her questions. Face to face 15 min.   Consider the Low Glycemic Index Diet and 6 smaller meals daily .  This boosts your metabolism and regulates your sugars:   Use the protein bar by Atkins because they have lots of fiber in them  Find the low carb flatbreads, tortillas and pita breads for sandwiches:  Joseph's makes a pita bread and a flat bread , available at [facility] and [employer]; Wilson makes a low carb flatbread available at [employer] and [employer] that is 9 net carbs and 100 cal Mission makes a low carb whole wheat tortilla available at [employer] most grocery stores with 6 net carbs and 210 cal  [place] yogurt can still have  a lot of carbs .  Dannon Light N fit has 80 cal and 8 carbs

## 2019-04-22 NOTE — Patient Instructions (Signed)
° °Calorie Counting for Weight Loss °Calories are units of energy. Your body needs a certain amount of calories from food to keep you going throughout the day. When you eat more calories than your body needs, your body stores the extra calories as fat. When you eat fewer calories than your body needs, your body burns fat to get the energy it needs. °Calorie counting means keeping track of how many calories you eat and drink each day. Calorie counting can be helpful if you need to lose weight. If you make sure to eat fewer calories than your body needs, you should lose weight. Ask your health care provider what a healthy weight is for you. °For calorie counting to work, you will need to eat the right number of calories in a day in order to lose a healthy amount of weight per week. A dietitian can help you determine how many calories you need in a day and will give you suggestions on how to reach your calorie goal. °· A healthy amount of weight to lose per week is usually 1-2 lb (0.5-0.9 kg). This usually means that your daily calorie intake should be reduced by 500-750 calories. °· Eating 1,200 - 1,500 calories per day can help most women lose weight. °· Eating 1,500 - 1,800 calories per day can help most men lose weight. °What is my plan? °My goal is to have __________ calories per day. °If I have this many calories per day, I should lose around __________ pounds per week. °What do I need to know about calorie counting? °In order to meet your daily calorie goal, you will need to: °· Find out how many calories are in each food you would like to eat. Try to do this before you eat. °· Decide how much of the food you plan to eat. °· Write down what you ate and how many calories it had. Doing this is called keeping a food log. °To successfully lose weight, it is important to balance calorie counting with a healthy lifestyle that includes regular activity. Aim for 150 minutes of moderate exercise (such as walking) or 75  minutes of vigorous exercise (such as running) each week. °Where do I find calorie information? ° °The number of calories in a food can be found on a Nutrition Facts label. If a food does not have a Nutrition Facts label, try to look up the calories online or ask your dietitian for help. °Remember that calories are listed per serving. If you choose to have more than one serving of a food, you will have to multiply the calories per serving by the amount of servings you plan to eat. For example, the label on a package of bread might say that a serving size is 1 slice and that there are 90 calories in a serving. If you eat 1 slice, you will have eaten 90 calories. If you eat 2 slices, you will have eaten 180 calories. °How do I keep a food log? °Immediately after each meal, record the following information in your food log: °· What you ate. Don't forget to include toppings, sauces, and other extras on the food. °· How much you ate. This can be measured in cups, ounces, or number of items. °· How many calories each food and drink had. °· The total number of calories in the meal. °Keep your food log near you, such as in a small notebook in your pocket, or use a mobile app or website. Some programs will   calculate calories for you and show you how many calories you have left for the day to meet your goal. °What are some calorie counting tips? ° °· Use your calories on foods and drinks that will fill you up and not leave you hungry: °? Some examples of foods that fill you up are nuts and nut butters, vegetables, lean proteins, and high-fiber foods like whole grains. High-fiber foods are foods with more than 5 g fiber per serving. °? Drinks such as sodas, specialty coffee drinks, alcohol, and juices have a lot of calories, yet do not fill you up. °· Eat nutritious foods and avoid empty calories. Empty calories are calories you get from foods or beverages that do not have many vitamins or protein, such as candy, sweets, and  soda. It is better to have a nutritious high-calorie food (such as an avocado) than a food with few nutrients (such as a bag of chips). °· Know how many calories are in the foods you eat most often. This will help you calculate calorie counts faster. °· Pay attention to calories in drinks. Low-calorie drinks include water and unsweetened drinks. °· Pay attention to nutrition labels for "low fat" or "fat free" foods. These foods sometimes have the same amount of calories or more calories than the full fat versions. They also often have added sugar, starch, or salt, to make up for flavor that was removed with the fat. °· Find a way of tracking calories that works for you. Get creative. Try different apps or programs if writing down calories does not work for you. °What are some portion control tips? °· Know how many calories are in a serving. This will help you know how many servings of a certain food you can have. °· Use a measuring cup to measure serving sizes. You could also try weighing out portions on a kitchen scale. With time, you will be able to estimate serving sizes for some foods. °· Take some time to put servings of different foods on your favorite plates, bowls, and cups so you know what a serving looks like. °· Try not to eat straight from a bag or box. Doing this can lead to overeating. Put the amount you would like to eat in a cup or on a plate to make sure you are eating the right portion. °· Use smaller plates, glasses, and bowls to prevent overeating. °· Try not to multitask (for example, watch TV or use your computer) while eating. If it is time to eat, sit down at a table and enjoy your food. This will help you to know when you are full. It will also help you to be aware of what you are eating and how much you are eating. °What are tips for following this plan? °Reading food labels °· Check the calorie count compared to the serving size. The serving size may be smaller than what you are used to  eating. °· Check the source of the calories. Make sure the food you are eating is high in vitamins and protein and low in saturated and trans fats. °Shopping °· Read nutrition labels while you shop. This will help you make healthy decisions before you decide to purchase your food. °· Make a grocery list and stick to it. °Cooking °· Try to cook your favorite foods in a healthier way. For example, try baking instead of frying. °· Use low-fat dairy products. °Meal planning °· Use more fruits and vegetables. Half of your plate should be   fruits and vegetables. °· Include lean proteins like poultry and fish. °How do I count calories when eating out? °· Ask for smaller portion sizes. °· Consider sharing an entree and sides instead of getting your own entree. °· If you get your own entree, eat only half. Ask for a box at the beginning of your meal and put the rest of your entree in it so you are not tempted to eat it. °· If calories are listed on the menu, choose the lower calorie options. °· Choose dishes that include vegetables, fruits, whole grains, low-fat dairy products, and lean protein. °· Choose items that are boiled, broiled, grilled, or steamed. Stay away from items that are buttered, battered, fried, or served with cream sauce. Items labeled "crispy" are usually fried, unless stated otherwise. °· Choose water, low-fat milk, unsweetened iced tea, or other drinks without added sugar. If you want an alcoholic beverage, choose a lower calorie option such as a glass of wine or light beer. °· Ask for dressings, sauces, and syrups on the side. These are usually high in calories, so you should limit the amount you eat. °· If you want a salad, choose a garden salad and ask for grilled meats. Avoid extra toppings like bacon, cheese, or fried items. Ask for the dressing on the side, or ask for olive oil and vinegar or lemon to use as dressing. °· Estimate how many servings of a food you are given. For example, a serving of  cooked rice is ½ cup or about the size of half a baseball. Knowing serving sizes will help you be aware of how much food you are eating at restaurants. The list below tells you how big or small some common portion sizes are based on everyday objects: °? 1 oz--4 stacked dice. °? 3 oz--1 deck of cards. °? 1 tsp--1 die. °? 1 Tbsp--½ a ping-pong ball. °? 2 Tbsp--1 ping-pong ball. °? ½ cup--½ baseball. °? 1 cup--1 baseball. °Summary °· Calorie counting means keeping track of how many calories you eat and drink each day. If you eat fewer calories than your body needs, you should lose weight. °· A healthy amount of weight to lose per week is usually 1-2 lb (0.5-0.9 kg). This usually means reducing your daily calorie intake by 500-750 calories. °· The number of calories in a food can be found on a Nutrition Facts label. If a food does not have a Nutrition Facts label, try to look up the calories online or ask your dietitian for help. °· Use your calories on foods and drinks that will fill you up, and not on foods and drinks that will leave you hungry. °· Use smaller plates, glasses, and bowls to prevent overeating. °This information is not intended to replace advice given to you by your health care provider. Make sure you discuss any questions you have with your health care provider. °Document Released: 09/01/2005 Document Revised: 05/21/2018 Document Reviewed: 08/01/2016 °Elsevier Patient Education © 2020 [employer] ° °

## 2019-04-22 NOTE — Telephone Encounter (Signed)
I spoke with patient & she is comfortable pursuing both the DEXA scan as well hematology referral.

## 2019-04-25 ENCOUNTER — Other Ambulatory Visit: Payer: Self-pay | Admitting: Family

## 2019-04-25 DIAGNOSIS — R748 Abnormal levels of other serum enzymes: Secondary | ICD-10-CM

## 2019-04-25 NOTE — Telephone Encounter (Signed)
I left detailed message advising on below. I also provided patient with number to [facility] to schedule DEXA scan. I asked she call back if any further questions.

## 2019-04-25 NOTE — Telephone Encounter (Signed)
Call pt Ref to heme and order for DEXA  Advised patient to call us if she is not heard from [place] in regards to hematology referral in one week.  In regards to bone density, she may schedule this along with her mammogram (please emphasize this that she is very overdue for her mammogram), and [place].    She may schedule these both on the same day her convenience.   Please call call and schedule your 3D mammogram, bone density scan as discussed.   [place]  [place], [place]

## 2019-04-28 ENCOUNTER — Ambulatory Visit
Admission: RE | Admit: 2019-04-28 | Discharge: 2019-04-28 | Disposition: A | Discharge status: Home / Self Care | Payer: Self-pay | Source: Ambulatory Visit | Attending: Family | Admitting: Family

## 2019-04-28 ENCOUNTER — Other Ambulatory Visit: Payer: Self-pay

## 2019-04-28 DIAGNOSIS — R748 Abnormal levels of other serum enzymes: Secondary | ICD-10-CM | POA: Insufficient documentation

## 2019-04-29 ENCOUNTER — Encounter: Payer: Self-pay | Admitting: Family

## 2019-05-02 ENCOUNTER — Telehealth: Payer: Self-pay | Admitting: Family

## 2019-05-02 ENCOUNTER — Other Ambulatory Visit: Payer: Self-pay

## 2019-05-02 NOTE — Telephone Encounter (Signed)
Call patient In response to her MyChart message, let particular questions may she have. She has osteopenia however not osteoporosis.  She does not meet guidelines for starting medication for osteoporosis at this time.  I do think however with her alkaline phosphatase has been elevated, this may be the reason

## 2019-05-02 NOTE — Telephone Encounter (Signed)
In response to her MyChart message,   any particular questions that she may  Have?  She has osteopenia however not osteoporosis.  She does not meet criteria/ guidelines for starting medication for osteoporosis at this time.     I do think however with her alkaline phosphatase has been elevated, this may be the reason why.   We will repeat dexa in 2 years.   Please keep appt with Dr Tasia Catchings ( hematology) for 05/03/19.

## 2019-05-03 ENCOUNTER — Inpatient Hospital Stay: Payer: Self-pay | Admitting: Oncology

## 2019-05-03 ENCOUNTER — Other Ambulatory Visit: Payer: Self-pay

## 2019-05-03 VITALS — BP 125/74 | HR 85 | Temp 96.7°F | Wt 223.4 lb

## 2019-05-03 NOTE — Progress Notes (Signed)
Pt here for new pt appt with Dr. Tasia Catchings for anemia. No acute complaints at this time.

## 2019-05-04 NOTE — Telephone Encounter (Signed)
Patient aware.  Voiced understanding with no questions.  Patient did see Dr. Tasia Catchings on 05/03/19 and said that she has an appointment with Dr. Janese Banks on 05/12/19 to address her iron deficiency.

## 2019-05-08 ENCOUNTER — Encounter: Payer: Self-pay | Admitting: Family

## 2019-05-11 ENCOUNTER — Other Ambulatory Visit: Payer: Self-pay | Admitting: Family

## 2019-05-11 DIAGNOSIS — G4700 Insomnia, unspecified: Secondary | ICD-10-CM

## 2019-05-11 MED ORDER — TRAZODONE HCL 50 MG PO TABS: 25.0000 mg | ORAL_TABLET | Freq: Every evening | ORAL | 3 refills | Status: DC | PRN

## 2019-05-12 ENCOUNTER — Telehealth: Payer: Self-pay | Admitting: *Deleted

## 2019-05-12 ENCOUNTER — Inpatient Hospital Stay: Payer: Self-pay

## 2019-05-12 ENCOUNTER — Encounter: Payer: Self-pay | Admitting: Oncology

## 2019-05-12 ENCOUNTER — Inpatient Hospital Stay: Payer: Self-pay | Attending: Oncology | Admitting: Oncology

## 2019-05-12 ENCOUNTER — Other Ambulatory Visit: Payer: Self-pay

## 2019-05-12 ENCOUNTER — Encounter: Payer: Self-pay | Admitting: *Deleted

## 2019-05-12 VITALS — BP 117/68 | HR 63 | Temp 97.3°F | Resp 16 | Wt 218.8 lb

## 2019-05-12 DIAGNOSIS — D508 Other iron deficiency anemias: Secondary | ICD-10-CM

## 2019-05-12 DIAGNOSIS — E785 Hyperlipidemia, unspecified: Secondary | ICD-10-CM | POA: Insufficient documentation

## 2019-05-12 DIAGNOSIS — E119 Type 2 diabetes mellitus without complications: Secondary | ICD-10-CM | POA: Insufficient documentation

## 2019-05-12 DIAGNOSIS — D509 Iron deficiency anemia, unspecified: Secondary | ICD-10-CM | POA: Insufficient documentation

## 2019-05-12 DIAGNOSIS — R748 Abnormal levels of other serum enzymes: Secondary | ICD-10-CM | POA: Insufficient documentation

## 2019-05-12 DIAGNOSIS — Z9884 Bariatric surgery status: Secondary | ICD-10-CM | POA: Insufficient documentation

## 2019-05-12 DIAGNOSIS — F419 Anxiety disorder, unspecified: Secondary | ICD-10-CM | POA: Insufficient documentation

## 2019-05-12 DIAGNOSIS — D519 Vitamin B12 deficiency anemia, unspecified: Secondary | ICD-10-CM | POA: Insufficient documentation

## 2019-05-12 DIAGNOSIS — E039 Hypothyroidism, unspecified: Secondary | ICD-10-CM | POA: Insufficient documentation

## 2019-05-12 DIAGNOSIS — E669 Obesity, unspecified: Secondary | ICD-10-CM | POA: Insufficient documentation

## 2019-05-12 DIAGNOSIS — I10 Essential (primary) hypertension: Secondary | ICD-10-CM | POA: Insufficient documentation

## 2019-05-12 DIAGNOSIS — Z79899 Other long term (current) drug therapy: Secondary | ICD-10-CM | POA: Insufficient documentation

## 2019-05-12 LAB — IRON AND TIBC
Iron: 43 ug/dL (ref 28–170)
Saturation Ratios: 9 % — ABNORMAL LOW (ref 10.4–31.8)
TIBC: 472 ug/dL — ABNORMAL HIGH (ref 250–450)
UIBC: 429 ug/dL

## 2019-05-12 LAB — CBC WITH DIFFERENTIAL/PLATELET
Abs Immature Granulocytes: 0.03 10*3/uL (ref 0.00–0.07)
Basophils Absolute: 0 10*3/uL (ref 0.0–0.1)
Basophils Relative: 0 %
Eosinophils Absolute: 0.1 10*3/uL (ref 0.0–0.5)
Eosinophils Relative: 2 %
HCT: 37.2 % (ref 36.0–46.0)
Hemoglobin: 12 g/dL (ref 12.0–15.0)
Immature Granulocytes: 0 %
Lymphocytes Relative: 20 %
Lymphs Abs: 1.9 10*3/uL (ref 0.7–4.0)
MCH: 29.8 pg (ref 26.0–34.0)
MCHC: 32.3 g/dL (ref 30.0–36.0)
MCV: 92.3 fL (ref 80.0–100.0)
Monocytes Absolute: 0.7 10*3/uL (ref 0.1–1.0)
Monocytes Relative: 8 %
Neutro Abs: 6.8 10*3/uL (ref 1.7–7.7)
Neutrophils Relative %: 70 %
Platelets: 260 10*3/uL (ref 150–400)
RBC: 4.03 MIL/uL (ref 3.87–5.11)
RDW: 14.1 % (ref 11.5–15.5)
WBC: 9.6 10*3/uL (ref 4.0–10.5)
nRBC: 0 % (ref 0.0–0.2)

## 2019-05-12 LAB — FERRITIN: Ferritin: 10 ng/mL — ABNORMAL LOW (ref 11–307)

## 2019-05-12 LAB — VITAMIN B12: Vitamin B-12: 217 pg/mL (ref 180–914)

## 2019-05-12 NOTE — Telephone Encounter (Signed)
-----   Message from Sindy Guadel[place], MD sent at 05/12/2019  4:09 PM EDT ----- She has evidence of iron deficiency and would benefit from ferahme X2. pleasee let her know. Thanks, Astrid Divine

## 2019-05-12 NOTE — Progress Notes (Signed)
Pt states that she had not had a physical in a while so they started do a work up and she had elevated liver enzymes and that led to more test and now here because of the elevation

## 2019-05-12 NOTE — Telephone Encounter (Signed)
I called pt and left her a message about her iron studies and the recommendation for 2 doses of feraheme. I also will send her a my chart message if it is ok to make appt for feraheme.

## 2019-05-13 ENCOUNTER — Ambulatory Visit: Payer: Self-pay

## 2019-05-13 ENCOUNTER — Encounter: Payer: Self-pay | Admitting: *Deleted

## 2019-05-16 ENCOUNTER — Ambulatory Visit (INDEPENDENT_AMBULATORY_CARE_PROVIDER_SITE_OTHER): Payer: Self-pay

## 2019-05-16 ENCOUNTER — Other Ambulatory Visit: Payer: Self-pay

## 2019-05-16 VITALS — Ht 64.5 in

## 2019-05-16 DIAGNOSIS — Z23 Encounter for immunization: Secondary | ICD-10-CM

## 2019-05-17 ENCOUNTER — Encounter: Payer: Self-pay | Admitting: Oncology

## 2019-05-17 NOTE — Progress Notes (Signed)
Hematology/Oncology Consult note [facility]  Telephone:([phone] [phone]  Patient Care Team: Allegra Grana, FNP as PCP - General (Family Medicine)   Name of the patient: [name]  [id number]  09-22-1965   Date of visit: 05/17/19  Diagnosis- 1.  Iron deficiency anemia lost to follow-up 2.  Elevated alkaline phosphatase level  Chief complaint/ Reason for visit-referred for elevated alkaline phosphatase level  Heme/Onc history: Patient is a 52 year old female with history of iron deficiency and B12 deficiency anemia status post gastric bypass who last saw me in October 2018 and received IV iron at that time.  She subsequently did not follow-up.  She has been referred to me for elevated alkaline phosphatase level.  She currently sees Dr. Servando Snare and was noted to have isolated elevated alkaline phosphatase.  GGT is normal. She was noted to have a low vitamin D level of 19 in May 2020.  PTH and calcium levels were normal.  Fractionated alkaline phosphatase showed normal bone intestinal and liver fraction.  Interval history-overall patient reports feeling well.  She has mild chronic fatigue which is unchanged  ECOG PS- 1 Pain scale- 0 Opioid associated constipation- no  Review of systems- Review of Systems  Constitutional: Positive for malaise/fatigue. Negative for chills, fever and weight loss.  HENT: Negative for congestion, ear discharge and nosebleeds.   Eyes: Negative for blurred vision.  Respiratory: Negative for cough, hemoptysis, sputum production, shortness of breath and wheezing.   Cardiovascular: Negative for chest pain, palpitations, orthopnea and claudication.  Gastrointestinal: Negative for abdominal pain, blood in stool, constipation, diarrhea, heartburn, melena, nausea and vomiting.  Genitourinary: Negative for dysuria, flank pain, frequency, hematuria and urgency.  Musculoskeletal: Negative for back pain, joint pain and  myalgias.  Skin: Negative for rash.  Neurological: Negative for dizziness, tingling, focal weakness, seizures, weakness and headaches.  Endo/Heme/Allergies: Does not bruise/bleed easily.  Psychiatric/Behavioral: Negative for depression and suicidal ideas. The patient does not have insomnia.        Allergies  Allergen Reactions  . Latex Rash  . Penicillins Itching and Swelling    Has patient had a PCN reaction causing immediate rash, facial/tongue/throat swelling, SOB or lightheadedness with hypotension: yes Has patient had a PCN reaction causing severe rash involving mucus membranes or skin necrosis: no Has patient had a PCN reaction that required hospitalization : yes ed visit Has patient had a PCN reaction occurring within the last 10 years: yes If all of the above answers are "NO", then may proceed with Cephalosporin use.      Past Medical History:  Diagnosis Date  . Anemia   . Anxiety and depression   . Chronic back pain   . DM2 (diabetes mellitus, type 2) (HCC)    improved since gastric bypass  . Headache    stress/sinus - 2-3x/wk  . History of left shoulder fracture   . HLD (hyperlipidemia)   . HTN (hypertension)    improved since gastric bypass  . Hypothyroid   . Left leg weakness    s/p back surgery  . Obesity   . S/P insertion of spinal cord stimulator   . Wears contact lenses      Past Surgical History:  Procedure Laterality Date  . Abd [place] - gallstones  5/03  . BACK SURGERY  2009   decompressive laminectomy  . BARIATRIC SURGERY  03/02/2012   gastric bypass; Dr Smitty Cords  . CHOLECYSTECTOMY  6/03  . COLONOSCOPY WITH PROPOFOL N/A 05/01/2017  Procedure: COLONOSCOPY WITH PROPOFOL;  Surgeon: Midge MiniumWohl, Darren, MD;  [facility];  Service: Gastroenterology;  Laterality: N/A;  . DILATION AND CURETTAGE OF UTERUS  1/05  . ESOPHAGOGASTRODUODENOSCOPY (EGD) WITH PROPOFOL N/A 05/01/2017   Procedure: ESOPHAGOGASTRODUODENOSCOPY (EGD) WITH PROPOFOL;  Surgeon:  Midge MiniumWohl, Darren, MD;  [facility];  Service: Gastroenterology;  Laterality: N/A;  . KNEE SURGERY     L - arthroscopic  . LIVER BIOPSY  6/03   fatty liver  . LUMBAR SPINE SURGERY  2015   Dr. Amalia Greenhouseehmig  . NASAL SINUS SURGERY    . PANNICULECTOMY  08/2013   [facility], Dr. Diamond NickelErdman  . sleep study - apnea  04/2001   improved since gastric bypass    Social History   Socioeconomic History  . Marital status: Married    Spouse name: Not on file  . Number of children: Not on file  . Years of education: Not on file  . Highest education level: Not on file  Occupational History  . Not on file  Social Needs  . Financial resource strain: Not on file  . Food insecurity    Worry: Not on file    Inability: Not on file  . Transportation needs    Medical: Not on file    Non-medical: Not on file  Tobacco Use  . Smoking status: Never Smoker  . Smokeless tobacco: Never Used  . Tobacco comment: tobacco use - no  Substance and Sexual Activity  . Alcohol use: No  . Drug use: No  . Sexual activity: Not Currently    Birth control/protection: None  Lifestyle  . Physical activity    Days per week: Not on file    Minutes per session: Not on file  . Stress: Not on file  Relationships  . Social Musicianconnections    Talks on phone: Not on file    Gets together: Not on file    Attends religious service: Not on file    Active member of club or organization: Not on file    Attends meetings of clubs or organizations: Not on file    Relationship status: Not on file  . Intimate partner violence    Fear of current or ex partner: Not on file    Emotionally abused: Not on file    Physically abused: Not on file    Forced sexual activity: Not on file  Other Topics Concern  . Not on file  Social History Narrative   Partner- Zella BallRobin, no children; works in [employer], does not get regular exercise.     Family History  Problem Relation Age of Onset  . Lung cancer Father   . Diabetes Mother   . Heart  failure Mother   . Diabetes Sister   . Fibromyalgia Sister   . Colon cancer Maternal Aunt   . Skin cancer Paternal Aunt   . Skin cancer Paternal Uncle      Current Outpatient Medications:  .  acyclovir (ZOVIRAX) 400 MG tablet, Take 400 mg by mouth 2 (two) times daily as needed. , Disp: , Rfl:  .  albuterol (PROVENTIL HFA;VENTOLIN HFA) 108 (90 Base) MCG/ACT inhaler, Inhale 2 puffs into the lungs every 6 (six) hours as needed for wheezing or shortness of breath., Disp: 1 Inhaler, Rfl: 0 .  baclofen (LIORESAL) 10 MG tablet, Take 10 mg by mouth daily., Disp: , Rfl:  .  DULoxetine (CYMBALTA) 60 MG capsule, Take 60 mg by mouth daily., Disp: , Rfl:  .  fentaNYL (DURAGESIC - DOSED MCG/HR) 50 MCG/HR, APPLY 1 PATCH(ES) EVERY 72 HOURS BY TRANSDERMAL ROUTE., Disp: , Rfl: 0 .  gabapentin (NEURONTIN) 400 MG capsule, Take 800 mg by mouth 2 (two) times daily. , Disp: , Rfl:  .  levothyroxine (SYNTHROID) 100 MCG tablet, Take 1 tablet (100 mcg total) by mouth daily., Disp: 90 tablet, Rfl: 1 .  naloxone (NARCAN) nasal spray 4 mg/0.1 mL, Place 1 spray into the nose as needed (in case of too much pain patch in her system)., Disp: , Rfl:  .  phentermine 37.5 MG capsule, Take 1 capsule (37.5 mg total) by mouth every morning., Disp: 30 capsule, Rfl: 0 .  promethazine (PHENERGAN) 12.5 MG tablet, Take 12.5 mg by mouth every 6 (six) hours as needed. , Disp: , Rfl:  .  propranolol (INDERAL) 20 MG tablet, Take 1 tablet (20 mg total) by mouth 2 (two) times daily., Disp: 120 tablet, Rfl: 0 .  traZODone (DESYREL) 50 MG tablet, Take 0.5-1 tablets (25-50 mg total) by mouth at bedtime as needed for sleep. (Patient not taking: Reported on 05/12/2019), Disp: 30 tablet, Rfl: 3  Physical exam:  Vitals:   05/12/19 1344  BP: 117/68  Pulse: 63  Resp: 16  Temp: (!) 97.3 F (36.3 C)  TempSrc: Tympanic  Weight: 218 lb 12.8 oz (99.2 kg)   Physical Exam Constitutional:      General: She is not in acute distress. HENT:      Head: Normocephalic and atraumatic.  Eyes:     Pupils: Pupils are equal, round, and reactive to light.  Neck:     Musculoskeletal: Normal range of motion.  Cardiovascular:     Rate and Rhythm: Normal rate and regular rhythm.     Heart sounds: Normal heart sounds.  Pulmonary:     Effort: Pulmonary effort is normal.     Breath sounds: Normal breath sounds.  Abdominal:     General: Bowel sounds are normal.     Palpations: Abdomen is soft.  Lymphadenopathy:     Comments: No palpable cervical, supraclavicular, axillary or inguinal adenopathy   Skin:    General: Skin is warm and dry.  Neurological:     Mental Status: She is alert and oriented to person, place, and time.      CMP Latest Ref Rng & Units 04/14/2019  Glucose 65 - 99 mg/dL -  BUN 6 - 24 mg/dL -  Creatinine 0.57 - 1.00 mg/dL -  Sodium 134 - 144 mmol/L -  Potassium 3.5 - 5.2 mmol/L -  Chloride 96 - 106 mmol/L -  CO2 20 - 29 mmol/L -  Calcium 8.7 - 10.2 mg/dL 9.0  Total Protein 6.0 - 8.5 g/dL 6.8  Total Bilirubin 0.0 - 1.2 mg/dL -  Alkaline Phos 39 - 117 IU/L -  AST 0 - 40 IU/L -  ALT 0 - 32 IU/L -   CBC Latest Ref Rng & Units 05/12/2019  WBC 4.0 - 10.5 K/uL 9.6  Hemoglobin 12.0 - 15.0 g/dL 12.0  Hematocrit 36.0 - 46.0 % 37.2  Platelets 150 - 400 K/uL 260    No images are attached to the encounter.  Dg Bone Density  Result Date: 04/28/2019 EXAM: DUAL X-RAY ABSORPTIOMETRY (DXA) FOR BONE MINERAL DENSITY IMPRESSION: Technologist: MTB Your patient Pauline Pegues completed a BMD test on 04/28/2019 using the [place] (analysis version: 14.10) manufactured by [employer]. The following summarizes the results of our evaluation. PATIENT BIOGRAPHICAL: Name: Jenell, Dobransky Patient  ID: [id number] Birth Date: 05-12-1966 Height: 64.0 in. Gender: Female Exam Date: 04/28/2019 Weight: 218.4 lbs. Indications: Caucasian, Gastric Bypass, History of Spinal Surgery, Hypothyroid, Postmenopausal, History of Fracture  (Adult) Fractures: Ankle, Shoulder Treatments: Albuterol, Synthroid ASSESSMENT: The BMD measured at Femur Neck Right is 0.774 g/cm2 with a T-score of -1.9. This patient is considered osteopenic according to World Health Organization [facility](WHO) criteria. The scan quality is good. Lumbar spine was not utilized due to advanced degenerative changes and surgical hardware. Site Region Measured Measured WHO Young Adult BMD Date       Age      Classification T-score DualFemur Neck Right 04/28/2019 52.9 Osteopenia -1.9 0.774 g/cm2 DualFemur Total Mean 04/28/2019 52.9 Osteopenia -1.5 0.814 g/cm2 Left Forearm Radius 33% 04/28/2019 52.9 Osteopenia -1.7 0.726 g/cm2 World Health Organization [facility](WHO) criteria for post-menopausal, Caucasian Women: Normal:       T-score at or above -1 SD Osteopenia:   T-score between -1 and -2.5 SD Osteoporosis: T-score at or below -2.5 SD RECOMMENDATIONS: 1. All patients should optimize calcium and vitamin D intake. 2. Consider FDA-approved medical therapies in postmenopausal women and men aged 53 years and older, based on the following: a. A hip or vertebral(clinical or morphometric) fracture b. T-score < -2.5 at the femoral neck or spine after appropriate evaluation to exclude secondary causes c. Low bone mass (T-score between -1.0 and -2.5 at the femoral neck or spine) and a 10-year probability of a hip fracture > 3% or a 10-year probability of a major osteoporosis-related fracture > 20% based on the [place]-adapted WHO algorithm d. Clinician judgment and/or patient preferences may indicate treatment for people with 10-year fracture probabilities above or below these levels FOLLOW-UP: People with diagnosed cases of osteoporosis or at high risk for fracture should have regular bone mineral density tests. For patients eligible for Medicare, routine testing is allowed once every 2 years. The testing frequency can be increased to one year for patients who have rapidly progressing disease, those who are  receiving or discontinuing medical therapy to restore bone mass, or have additional risk factors. I have reviewed this report, and agree with the above findings. [facility] Dear Dr Jason CoopArnett, Your patient Herbert SpiresMelissa C Lindstrom completed a FRAX assessment on 04/28/2019 using the Lunar iDXA DXA System (analysis version: 14.10) manufactured by [employer]. The following summarizes the results of our evaluation. PATIENT BIOGRAPHICAL: Name: Herbert Spiresucker, Aidah C Patient ID: [id number] Birth Date: 05-12-1966 Height:    64.0 in. Gender:     Female    [age]       Weight:    218.4 lbs. Ethnicity:  White                            Exam Date: 04/28/2019 FRAX* RESULTS:  (version: 3.5) 10-year Probability of Fracture1 Major Osteoporotic Fracture2 Hip Fracture 10.8% 1.3% Population: [place] (Caucasian) Risk Factors: History of Fracture (Adult) Based on Femur (Right) Neck BMD 1 -The 10-year probability of fracture may be lower than reported if the patient has received treatment. 2 -Major Osteoporotic Fracture: Clinical Spine, Forearm, Hip or Shoulder *FRAX is a Armed forces logistics/support/administrative officertrademark of the [employer] for Metabolic Bone Disease, a World Science writerHealth Organization (WHO) [employer]. ASSESSMENT: The probability of a major osteoporotic fracture is 10.8% within the next ten years. The probability of a hip fracture is 1.3% within the next ten years. . Electronically Signed   By:  Bretta Bang III M.D.   On: 04/28/2019 15:19     Assessment and plan- Patient is a 53 y.o. female with following issues:  1.  Elevated alkaline phosphatase level: GGT is normal making it less likely to be secondary to liver.  But her intestinal and bone fractions also remain normal.  Elevated alkaline phosphatase level can be seen in a variety of conditions including Nonhepatic causes such as hyperthyroidism osteomalacia, diabetes as well as rare extrahepatic tumors.  Patient did undergo a CT abdomen in February 2020  which did not reveal any evidence of liver mass, uterine, ovarian, gastric or renal pathology.  No significant adenopathy was noted.  I will proceed with a bone scan at this time and if that is normal I would therefore further work-up to her primary care provider.  It is possible that her alkaline phosphatase levels are high due to vitamin D deficiency.  2.  Given that she has history of gastric bypass and was lost to follow-up after IV iron I will check her CBC ferritin iron studies as well as B12 levels today and supplement it based on her levels.  Repeat CBC ferritin and iron studies in 3 months in 6 months and I will see her back in 6 months   Visit Diagnosis 1. Bariatric surgery status   2. Other iron deficiency anemia   3. Elevated alkaline phosphatase level      Dr. Owens Shark, MD, MPH [facility] at [facility] [phone] 05/17/2019 12:45 PM

## 2019-05-18 ENCOUNTER — Other Ambulatory Visit: Payer: Self-pay

## 2019-05-18 ENCOUNTER — Inpatient Hospital Stay: Payer: Self-pay | Attending: Oncology

## 2019-05-18 VITALS — BP 122/68 | HR 61 | Temp 97.1°F | Resp 18

## 2019-05-18 DIAGNOSIS — D508 Other iron deficiency anemias: Secondary | ICD-10-CM | POA: Insufficient documentation

## 2019-05-18 DIAGNOSIS — Z748 Other problems related to care provider dependency: Secondary | ICD-10-CM | POA: Insufficient documentation

## 2019-05-18 DIAGNOSIS — Z9884 Bariatric surgery status: Secondary | ICD-10-CM | POA: Insufficient documentation

## 2019-05-18 DIAGNOSIS — D509 Iron deficiency anemia, unspecified: Secondary | ICD-10-CM

## 2019-05-18 MED ORDER — SODIUM CHLORIDE 0.9 % IV SOLN
510.0000 mg | Freq: Once | INTRAVENOUS | Status: AC
  Administered 2019-05-18: 510 mg via INTRAVENOUS
  Filled 2019-05-18: qty 17

## 2019-05-18 MED ORDER — SODIUM CHLORIDE 0.9 % IV SOLN
Freq: Once | INTRAVENOUS | Status: AC
  Administered 2019-05-18: 14:00:00 via INTRAVENOUS
  Filled 2019-05-18: qty 250

## 2019-05-18 NOTE — Progress Notes (Signed)
This visit type was conducted due to national recommendations for restrictions regarding the COVID-19 pandemic (e.g. social distancing).  This format is felt to be most appropriate for this patient at this time.  All issues noted in this document were discussed and addressed.  No physical exam was performed (except for noted visual exam findings with Video Visits). Virtual Visit via Video Note  I connected with@  on 05/20/19 at  2:00 PM EDT by a video enabled telemedicine application and verified that I am speaking with the correct person using two identifiers.  Location patient: home Location provider:work  Persons participating in the virtual visit: patient, provider  I discussed the limitations of evaluation and management by telemedicine and the availability of in person appointments. The patient expressed understanding and agreed to proceed.   HPI: Started propranolol at last visit.   Anxiety - improved on propranolol. Not as aggravated.   Insomnia- taking 50 mg trazodone. Sleeping has improved.   HA- improving. Not getting every day. 3 days per week. HA are not as severe. No dizziness.   decreased excredrin, only uses with HA with relief.   Osteopenia  alkaline phosphatase elevated-per Dr Janese Banks; suspect more from bone. Can also be from vitamin d deficiency.   IDA and b12 deficiency- Iron infusion this week.   Following with Dr. Janese Banks on 05/12/19- reviewed Office notes. Bone scan scheduled for next week.   ROS: See pertinent positives and negatives per HPI.  Past Medical History:  Diagnosis Date  . Anemia   . Anxiety and depression   . Chronic back pain   . DM2 (diabetes mellitus, type 2) ([place])    improved since gastric bypass  . Headache    stress/sinus - 2-3x/wk  . History of left shoulder fracture   . HLD (hyperlipidemia)   . HTN (hypertension)    improved since gastric bypass  . Hypothyroid   . Left leg weakness    s/p back surgery  . Obesity   . S/P insertion of  spinal cord stimulator   . Wears contact lenses     Past Surgical History:  Procedure Laterality Date  . Abd [place] - gallstones  5/03  . BACK SURGERY  2009   decompressive laminectomy  . BARIATRIC SURGERY  03/02/2012   gastric bypass; Dr Darnell Level  . CHOLECYSTECTOMY  6/03  . COLONOSCOPY WITH PROPOFOL N/A 05/01/2017   Procedure: COLONOSCOPY WITH PROPOFOL;  Surgeon: Lucilla Lame, MD;  Location: [place];  Service: Gastroenterology;  Laterality: N/A;  . DILATION AND CURETTAGE OF UTERUS  1/05  . ESOPHAGOGASTRODUODENOSCOPY (EGD) WITH PROPOFOL N/A 05/01/2017   Procedure: ESOPHAGOGASTRODUODENOSCOPY (EGD) WITH PROPOFOL;  Surgeon: Lucilla Lame, MD;  Location: [place];  Service: Gastroenterology;  Laterality: N/A;  . KNEE SURGERY     L - arthroscopic  . LIVER BIOPSY  6/03   fatty liver  . LUMBAR SPINE SURGERY  2015   Dr. Mick Sell  . NASAL SINUS SURGERY    . PANNICULECTOMY  08/2013   [facility], Dr. Chrisandra Carota  . sleep study - apnea  04/2001   improved since gastric bypass    Family History  Problem Relation Age of Onset  . Lung cancer Father   . Diabetes Mother   . Heart failure Mother   . Diabetes Sister   . Fibromyalgia Sister   . Colon cancer Maternal Aunt   . Skin cancer Paternal Aunt   . Skin cancer Paternal Uncle     SOCIAL HX: never smoker  Current Outpatient Medications:  .  acyclovir (ZOVIRAX) 400 MG tablet, Take 400 mg by mouth 2 (two) times daily as needed. , Disp: , Rfl:  .  albuterol (PROVENTIL HFA;VENTOLIN HFA) 108 (90 Base) MCG/ACT inhaler, Inhale 2 puffs into the lungs every 6 (six) hours as needed for wheezing or shortness of breath., Disp: 1 Inhaler, Rfl: 0 .  baclofen (LIORESAL) 10 MG tablet, Take 10 mg by mouth daily., Disp: , Rfl:  .  DULoxetine (CYMBALTA) 60 MG capsule, Take 60 mg by mouth daily., Disp: , Rfl:  .  fentaNYL (DURAGESIC - DOSED MCG/HR) 50 MCG/HR, APPLY 1 PATCH(ES) EVERY 72 HOURS BY TRANSDERMAL ROUTE., Disp: , Rfl: 0 .  gabapentin  (NEURONTIN) 400 MG capsule, Take 800 mg by mouth 2 (two) times daily. , Disp: , Rfl:  .  levothyroxine (SYNTHROID) 100 MCG tablet, Take 1 tablet (100 mcg total) by mouth daily., Disp: 90 tablet, Rfl: 1 .  naloxone (NARCAN) nasal spray 4 mg/0.1 mL, Place 1 spray into the nose as needed (in case of too much pain patch in her system)., Disp: , Rfl:  .  phentermine 37.5 MG capsule, Take 1 capsule (37.5 mg total) by mouth every morning., Disp: 30 capsule, Rfl: 0 .  promethazine (PHENERGAN) 12.5 MG tablet, Take 12.5 mg by mouth every 6 (six) hours as needed. , Disp: , Rfl:  .  propranolol (INDERAL) 20 MG tablet, Take 1 tablet (20 mg total) by mouth 2 (two) times daily., Disp: 120 tablet, Rfl: 0 .  traZODone (DESYREL) 50 MG tablet, Take 0.5-1 tablets (25-50 mg total) by mouth at bedtime as needed for sleep., Disp: 30 tablet, Rfl: 3  EXAM:  VITALS per patient if applicable:  GENERAL: alert, oriented, appears well and in no acute distress  HEENT: atraumatic, conjunttiva clear, no obvious abnormalities on inspection of external nose and ears  NECK: normal movements of the head and neck  LUNGS: on inspection no signs of respiratory distress, breathing rate appears normal, no obvious gross SOB, gasping or wheezing  CV: no obvious cyanosis  MS: moves all visible extremities without noticeable abnormality  PSYCH/NEURO: pleasant and cooperative, no obvious depression or anxiety, speech and thought processing grossly intact  ASSESSMENT AND PLAN:  Discussed the following assessment and plan:  Problem List Items Addressed This Visit      Musculoskeletal and Integument   Osteopenia     Other   Depression with anxiety    Anxiety has improved with propranolol.  Insomnia improved with trazodone. patient will continue regimen.       Headache    Please as has improved with propranolol, decreasing Excedrin use.  Patient declines any further increase of propranolol.  She will let me know how she is  doing      Elevated alkaline phosphatase level    She seen Dr. Servando SnareWohl, Dr. Smith Robertao in regards to this.  It does appear to be more of a bone etiology.  Per Dr. Smith Robertao note, he did mention vit d deficiency could be an etiology.  Advised patient to go and start the vitamin D.  She is pending a bone scan with Dr. Smith Robertao as well.  Will follow           I discussed the assessment and treatment plan with the patient. The patient was provided an opportunity to ask questions and all were answered. The patient agreed with the plan and demonstrated an understanding of the instructions.   The patient was advised to call back  or seek an in-person evaluation if the symptoms worsen or if the condition fails to improve as anticipated.   Mable Paris, FNP

## 2019-05-18 NOTE — Telephone Encounter (Signed)
Noted Saw Dr Rao 05/17/19 who addressed elevated alk phos. Pending bone scan Will follow   

## 2019-05-18 NOTE — Progress Notes (Signed)
Pt states she gets Vit B12 injections at her GYN, and is scheduled to recieve one on Friday 05/20/2019.  1506: Pt tolerated infusion well. Pt monitored 30 minutes post infusion. Pt and VS stable at discharge.

## 2019-05-20 ENCOUNTER — Ambulatory Visit (INDEPENDENT_AMBULATORY_CARE_PROVIDER_SITE_OTHER): Payer: Self-pay | Admitting: Family

## 2019-05-20 ENCOUNTER — Encounter: Payer: Self-pay | Admitting: Family

## 2019-05-20 ENCOUNTER — Ambulatory Visit: Payer: Self-pay | Admitting: Certified Nurse Midwife

## 2019-05-20 ENCOUNTER — Other Ambulatory Visit: Payer: Self-pay

## 2019-05-20 ENCOUNTER — Encounter: Payer: Self-pay | Admitting: Certified Nurse Midwife

## 2019-05-20 VITALS — BP 136/75 | HR 70 | Ht 64.5 in | Wt 216.2 lb

## 2019-05-20 DIAGNOSIS — Z7689 Persons encountering health services in other specified circumstances: Secondary | ICD-10-CM

## 2019-05-20 DIAGNOSIS — M8580 Other specified disorders of bone density and structure, unspecified site: Secondary | ICD-10-CM

## 2019-05-20 DIAGNOSIS — F418 Other specified anxiety disorders: Secondary | ICD-10-CM

## 2019-05-20 DIAGNOSIS — R748 Abnormal levels of other serum enzymes: Secondary | ICD-10-CM

## 2019-05-20 DIAGNOSIS — R519 Headache, unspecified: Secondary | ICD-10-CM

## 2019-05-20 DIAGNOSIS — R51 Headache: Secondary | ICD-10-CM

## 2019-05-20 MED ORDER — CYANOCOBALAMIN 1000 MCG/ML IJ SOLN
1000.0000 ug | Freq: Once | INTRAMUSCULAR | Status: AC
  Administered 2019-05-20: 10:00:00 1000 ug via INTRAMUSCULAR

## 2019-05-20 MED ORDER — PHENTERMINE HCL 37.5 MG PO CAPS: 37.5000 mg | ORAL_CAPSULE | ORAL | 0 refills | Status: DC

## 2019-05-20 NOTE — Progress Notes (Signed)
SUBJECTIVE:  53 y.o. here for follow-up weight loss visit, previously seen 4 weeks ago. Denies any concerns and feels like medication is working well. She denies an side effects including chest pain, SOB, or palpitations Her BP is stable . She has lost 2 lbs 4oz since her last visit. She is walking the dog and parking further away to walk . She is planning on going back to the gym 2 x wk since gyms are now reopening. She is following her previous bariatric diet.  OBJECTIVE:  BP 136/75   Pulse 70   Ht 5' 4.5" (1.638 m)   Wt 216 lb 3.2 oz (98.1 kg)   LMP 12/16/2015   BMI 36.54 kg/m   Body mass index is 36.54 kg/m. Patient appears well. ASSESSMENT:  Obesity- responding well to weight loss plan PLAN:  To continue with current medications. B12 1038mcg/ml injection given RTC in 4 weeks as planned  Philip Aspen, CNM

## 2019-05-20 NOTE — Patient Instructions (Signed)
° °Calorie Counting for Weight Loss °Calories are units of energy. Your body needs a certain amount of calories from food to keep you going throughout the day. When you eat more calories than your body needs, your body stores the extra calories as fat. When you eat fewer calories than your body needs, your body burns fat to get the energy it needs. °Calorie counting means keeping track of how many calories you eat and drink each day. Calorie counting can be helpful if you need to lose weight. If you make sure to eat fewer calories than your body needs, you should lose weight. Ask your health care provider what a healthy weight is for you. °For calorie counting to work, you will need to eat the right number of calories in a day in order to lose a healthy amount of weight per week. A dietitian can help you determine how many calories you need in a day and will give you suggestions on how to reach your calorie goal. °· A healthy amount of weight to lose per week is usually 1-2 lb (0.5-0.9 kg). This usually means that your daily calorie intake should be reduced by 500-750 calories. °· Eating 1,200 - 1,500 calories per day can help most women lose weight. °· Eating 1,500 - 1,800 calories per day can help most men lose weight. °What is my plan? °My goal is to have __________ calories per day. °If I have this many calories per day, I should lose around __________ pounds per week. °What do I need to know about calorie counting? °In order to meet your daily calorie goal, you will need to: °· Find out how many calories are in each food you would like to eat. Try to do this before you eat. °· Decide how much of the food you plan to eat. °· Write down what you ate and how many calories it had. Doing this is called keeping a food log. °To successfully lose weight, it is important to balance calorie counting with a healthy lifestyle that includes regular activity. Aim for 150 minutes of moderate exercise (such as walking) or 75  minutes of vigorous exercise (such as running) each week. °Where do I find calorie information? ° °The number of calories in a food can be found on a Nutrition Facts label. If a food does not have a Nutrition Facts label, try to look up the calories online or ask your dietitian for help. °Remember that calories are listed per serving. If you choose to have more than one serving of a food, you will have to multiply the calories per serving by the amount of servings you plan to eat. For example, the label on a package of bread might say that a serving size is 1 slice and that there are 90 calories in a serving. If you eat 1 slice, you will have eaten 90 calories. If you eat 2 slices, you will have eaten 180 calories. °How do I keep a food log? °Immediately after each meal, record the following information in your food log: °· What you ate. Don't forget to include toppings, sauces, and other extras on the food. °· How much you ate. This can be measured in cups, ounces, or number of items. °· How many calories each food and drink had. °· The total number of calories in the meal. °Keep your food log near you, such as in a small notebook in your pocket, or use a mobile app or website. Some programs will   calculate calories for you and show you how many calories you have left for the day to meet your goal. °What are some calorie counting tips? ° °· Use your calories on foods and drinks that will fill you up and not leave you hungry: °? Some examples of foods that fill you up are nuts and nut butters, vegetables, lean proteins, and high-fiber foods like whole grains. High-fiber foods are foods with more than 5 g fiber per serving. °? Drinks such as sodas, specialty coffee drinks, alcohol, and juices have a lot of calories, yet do not fill you up. °· Eat nutritious foods and avoid empty calories. Empty calories are calories you get from foods or beverages that do not have many vitamins or protein, such as candy, sweets, and  soda. It is better to have a nutritious high-calorie food (such as an avocado) than a food with few nutrients (such as a bag of chips). °· Know how many calories are in the foods you eat most often. This will help you calculate calorie counts faster. °· Pay attention to calories in drinks. Low-calorie drinks include water and unsweetened drinks. °· Pay attention to nutrition labels for "low fat" or "fat free" foods. These foods sometimes have the same amount of calories or more calories than the full fat versions. They also often have added sugar, starch, or salt, to make up for flavor that was removed with the fat. °· Find a way of tracking calories that works for you. Get creative. Try different apps or programs if writing down calories does not work for you. °What are some portion control tips? °· Know how many calories are in a serving. This will help you know how many servings of a certain food you can have. °· Use a measuring cup to measure serving sizes. You could also try weighing out portions on a kitchen scale. With time, you will be able to estimate serving sizes for some foods. °· Take some time to put servings of different foods on your favorite plates, bowls, and cups so you know what a serving looks like. °· Try not to eat straight from a bag or box. Doing this can lead to overeating. Put the amount you would like to eat in a cup or on a plate to make sure you are eating the right portion. °· Use smaller plates, glasses, and bowls to prevent overeating. °· Try not to multitask (for example, watch TV or use your computer) while eating. If it is time to eat, sit down at a table and enjoy your food. This will help you to know when you are full. It will also help you to be aware of what you are eating and how much you are eating. °What are tips for following this plan? °Reading food labels °· Check the calorie count compared to the serving size. The serving size may be smaller than what you are used to  eating. °· Check the source of the calories. Make sure the food you are eating is high in vitamins and protein and low in saturated and trans fats. °Shopping °· Read nutrition labels while you shop. This will help you make healthy decisions before you decide to purchase your food. °· Make a grocery list and stick to it. °Cooking °· Try to cook your favorite foods in a healthier way. For example, try baking instead of frying. °· Use low-fat dairy products. °Meal planning °· Use more fruits and vegetables. Half of your plate should be   fruits and vegetables. °· Include lean proteins like poultry and fish. °How do I count calories when eating out? °· Ask for smaller portion sizes. °· Consider sharing an entree and sides instead of getting your own entree. °· If you get your own entree, eat only half. Ask for a box at the beginning of your meal and put the rest of your entree in it so you are not tempted to eat it. °· If calories are listed on the menu, choose the lower calorie options. °· Choose dishes that include vegetables, fruits, whole grains, low-fat dairy products, and lean protein. °· Choose items that are boiled, broiled, grilled, or steamed. Stay away from items that are buttered, battered, fried, or served with cream sauce. Items labeled "crispy" are usually fried, unless stated otherwise. °· Choose water, low-fat milk, unsweetened iced tea, or other drinks without added sugar. If you want an alcoholic beverage, choose a lower calorie option such as a glass of wine or light beer. °· Ask for dressings, sauces, and syrups on the side. These are usually high in calories, so you should limit the amount you eat. °· If you want a salad, choose a garden salad and ask for grilled meats. Avoid extra toppings like bacon, cheese, or fried items. Ask for the dressing on the side, or ask for olive oil and vinegar or lemon to use as dressing. °· Estimate how many servings of a food you are given. For example, a serving of  cooked rice is ½ cup or about the size of half a baseball. Knowing serving sizes will help you be aware of how much food you are eating at restaurants. The list below tells you how big or small some common portion sizes are based on everyday objects: °? 1 oz--4 stacked dice. °? 3 oz--1 deck of cards. °? 1 tsp--1 die. °? 1 Tbsp--½ a ping-pong ball. °? 2 Tbsp--1 ping-pong ball. °? ½ cup--½ baseball. °? 1 cup--1 baseball. °Summary °· Calorie counting means keeping track of how many calories you eat and drink each day. If you eat fewer calories than your body needs, you should lose weight. °· A healthy amount of weight to lose per week is usually 1-2 lb (0.5-0.9 kg). This usually means reducing your daily calorie intake by 500-750 calories. °· The number of calories in a food can be found on a Nutrition Facts label. If a food does not have a Nutrition Facts label, try to look up the calories online or ask your dietitian for help. °· Use your calories on foods and drinks that will fill you up, and not on foods and drinks that will leave you hungry. °· Use smaller plates, glasses, and bowls to prevent overeating. °This information is not intended to replace advice given to you by your health care provider. Make sure you discuss any questions you have with your health care provider. °Document Released: 09/01/2005 Document Revised: 05/21/2018 Document Reviewed: 08/01/2016 °Elsevier Patient Education © 2020 [employer] ° °

## 2019-05-20 NOTE — Assessment & Plan Note (Signed)
Anxiety has improved with propranolol.  Insomnia improved with trazodone. patient will continue regimen.

## 2019-05-20 NOTE — Assessment & Plan Note (Signed)
Please as has improved with propranolol, decreasing Excedrin use.  Patient declines any further increase of propranolol.  She will let me know how she is doing

## 2019-05-20 NOTE — Assessment & Plan Note (Signed)
She seen Dr. Allen Norris, Dr. Janese Banks in regards to this.  It does appear to be more of a bone etiology.  Per Dr. Janese Banks note, he did mention vit d deficiency could be an etiology.  Advised patient to go and start the vitamin D.  She is pending a bone scan with Dr. Janese Banks as well.  Will follow

## 2019-05-20 NOTE — Progress Notes (Signed)
Patient comes in today for weight check. She has lost 2 pounds since last visit. She is starting today going to the gym. She has been walking the dog and walking to school from parking lot for exercise. Tolerates the phentermine very well.

## 2019-05-20 NOTE — Patient Instructions (Addendum)
Your vitamin D level is low and this may be a reason for elevated alkaline phosphatase.   You may add over the counter vitamin D 1000 units by mouth daily for 12 weeks. Then you may call our office for a follow up visit and we can recheck level.   Continue to follow with Dr Janese Banks.   If you decide to forego appointment with Dr Harlow Mares for shoulder pain from fracture, please call and ask for an appointment with Frederico Hamman Copland at [facility].  Stay safe!

## 2019-05-24 ENCOUNTER — Encounter: Payer: Self-pay | Admitting: Pharmacy Technician

## 2019-05-24 NOTE — Progress Notes (Signed)
Patient has been approved for drug assistance by [employer] for Feraheme. The enrollment period is from 05/18/19-05/18/20 based on self pay. First DOS covered is 05/18/19.

## 2019-05-25 ENCOUNTER — Inpatient Hospital Stay: Payer: Self-pay

## 2019-05-25 ENCOUNTER — Other Ambulatory Visit: Payer: Self-pay

## 2019-05-25 ENCOUNTER — Other Ambulatory Visit: Payer: Self-pay | Admitting: *Deleted

## 2019-05-25 VITALS — BP 105/65 | HR 62 | Temp 98.2°F | Resp 18

## 2019-05-25 DIAGNOSIS — D509 Iron deficiency anemia, unspecified: Secondary | ICD-10-CM

## 2019-05-25 MED ORDER — FERAHEME 510 MG/17ML IV SOLN: 510.0000 mg | Freq: Once | INTRAVENOUS | 0 refills | Status: DC

## 2019-05-25 MED ORDER — SODIUM CHLORIDE 0.9 % IV SOLN
Freq: Once | INTRAVENOUS | Status: AC
  Administered 2019-05-25: 14:00:00 via INTRAVENOUS
  Filled 2019-05-25: qty 250

## 2019-05-25 MED ORDER — SODIUM CHLORIDE 0.9 % IV SOLN
510.0000 mg | Freq: Once | INTRAVENOUS | Status: AC
  Administered 2019-05-25: 15:00:00 510 mg via INTRAVENOUS
  Filled 2019-05-25: qty 17

## 2019-05-26 ENCOUNTER — Other Ambulatory Visit: Payer: Self-pay

## 2019-05-27 ENCOUNTER — Encounter
Admission: RE | Admit: 2019-05-27 | Discharge: 2019-05-27 | Disposition: A | Discharge status: Home / Self Care | Payer: Self-pay | Source: Ambulatory Visit | Attending: Oncology | Admitting: Oncology

## 2019-05-27 ENCOUNTER — Other Ambulatory Visit: Payer: Self-pay

## 2019-05-27 DIAGNOSIS — R748 Abnormal levels of other serum enzymes: Secondary | ICD-10-CM | POA: Insufficient documentation

## 2019-05-27 MED ORDER — TECHNETIUM TC 99M MEDRONATE IV KIT
20.0000 | PACK | Freq: Once | INTRAVENOUS | Status: AC | PRN
  Administered 2019-05-27: 23.107 via INTRAVENOUS

## 2019-06-02 ENCOUNTER — Encounter: Payer: Self-pay | Admitting: Oncology

## 2019-06-03 ENCOUNTER — Encounter: Payer: Self-pay | Admitting: Oncology

## 2019-06-10 ENCOUNTER — Other Ambulatory Visit: Payer: Self-pay

## 2019-06-10 ENCOUNTER — Ambulatory Visit (INDEPENDENT_AMBULATORY_CARE_PROVIDER_SITE_OTHER): Payer: Self-pay

## 2019-06-10 DIAGNOSIS — Z23 Encounter for immunization: Secondary | ICD-10-CM

## 2019-06-12 ENCOUNTER — Other Ambulatory Visit: Payer: Self-pay | Admitting: Family

## 2019-06-12 DIAGNOSIS — F418 Other specified anxiety disorders: Secondary | ICD-10-CM

## 2019-06-17 ENCOUNTER — Other Ambulatory Visit: Payer: Self-pay

## 2019-06-17 ENCOUNTER — Encounter: Payer: Self-pay | Admitting: Certified Nurse Midwife

## 2019-06-17 ENCOUNTER — Ambulatory Visit: Payer: Self-pay | Admitting: Certified Nurse Midwife

## 2019-06-17 VITALS — BP 120/63 | HR 57 | Ht 64.5 in | Wt 217.0 lb

## 2019-06-17 DIAGNOSIS — E669 Obesity, unspecified: Secondary | ICD-10-CM

## 2019-06-17 DIAGNOSIS — Z7689 Persons encountering health services in other specified circumstances: Secondary | ICD-10-CM

## 2019-06-17 DIAGNOSIS — Z6836 Body mass index (BMI) 36.0-36.9, adult: Secondary | ICD-10-CM

## 2019-06-17 MED ORDER — PHENTERMINE HCL 37.5 MG PO CAPS: 37.5000 mg | ORAL_CAPSULE | ORAL | 0 refills | Status: DC

## 2019-06-17 MED ORDER — CYANOCOBALAMIN 1000 MCG/ML IJ SOLN
1000.0000 ug | Freq: Once | INTRAMUSCULAR | Status: AC
  Administered 2019-06-17: 09:00:00 1000 ug via INTRAMUSCULAR

## 2019-06-17 NOTE — Patient Instructions (Signed)
° °Calorie Counting for Weight Loss °Calories are units of energy. Your body needs a certain amount of calories from food to keep you going throughout the day. When you eat more calories than your body needs, your body stores the extra calories as fat. When you eat fewer calories than your body needs, your body burns fat to get the energy it needs. °Calorie counting means keeping track of how many calories you eat and drink each day. Calorie counting can be helpful if you need to lose weight. If you make sure to eat fewer calories than your body needs, you should lose weight. Ask your health care provider what a healthy weight is for you. °For calorie counting to work, you will need to eat the right number of calories in a day in order to lose a healthy amount of weight per week. A dietitian can help you determine how many calories you need in a day and will give you suggestions on how to reach your calorie goal. °· A healthy amount of weight to lose per week is usually 1-2 lb (0.5-0.9 kg). This usually means that your daily calorie intake should be reduced by 500-750 calories. °· Eating 1,200 - 1,500 calories per day can help most women lose weight. °· Eating 1,500 - 1,800 calories per day can help most men lose weight. °What is my plan? °My goal is to have __________ calories per day. °If I have this many calories per day, I should lose around __________ pounds per week. °What do I need to know about calorie counting? °In order to meet your daily calorie goal, you will need to: °· Find out how many calories are in each food you would like to eat. Try to do this before you eat. °· Decide how much of the food you plan to eat. °· Write down what you ate and how many calories it had. Doing this is called keeping a food log. °To successfully lose weight, it is important to balance calorie counting with a healthy lifestyle that includes regular activity. Aim for 150 minutes of moderate exercise (such as walking) or 75  minutes of vigorous exercise (such as running) each week. °Where do I find calorie information? ° °The number of calories in a food can be found on a Nutrition Facts label. If a food does not have a Nutrition Facts label, try to look up the calories online or ask your dietitian for help. °Remember that calories are listed per serving. If you choose to have more than one serving of a food, you will have to multiply the calories per serving by the amount of servings you plan to eat. For example, the label on a package of bread might say that a serving size is 1 slice and that there are 90 calories in a serving. If you eat 1 slice, you will have eaten 90 calories. If you eat 2 slices, you will have eaten 180 calories. °How do I keep a food log? °Immediately after each meal, record the following information in your food log: °· What you ate. Don't forget to include toppings, sauces, and other extras on the food. °· How much you ate. This can be measured in cups, ounces, or number of items. °· How many calories each food and drink had. °· The total number of calories in the meal. °Keep your food log near you, such as in a small notebook in your pocket, or use a mobile app or website. Some programs will   calculate calories for you and show you how many calories you have left for the day to meet your goal. °What are some calorie counting tips? ° °· Use your calories on foods and drinks that will fill you up and not leave you hungry: °? Some examples of foods that fill you up are nuts and nut butters, vegetables, lean proteins, and high-fiber foods like whole grains. High-fiber foods are foods with more than 5 g fiber per serving. °? Drinks such as sodas, specialty coffee drinks, alcohol, and juices have a lot of calories, yet do not fill you up. °· Eat nutritious foods and avoid empty calories. Empty calories are calories you get from foods or beverages that do not have many vitamins or protein, such as candy, sweets, and  soda. It is better to have a nutritious high-calorie food (such as an avocado) than a food with few nutrients (such as a bag of chips). °· Know how many calories are in the foods you eat most often. This will help you calculate calorie counts faster. °· Pay attention to calories in drinks. Low-calorie drinks include water and unsweetened drinks. °· Pay attention to nutrition labels for "low fat" or "fat free" foods. These foods sometimes have the same amount of calories or more calories than the full fat versions. They also often have added sugar, starch, or salt, to make up for flavor that was removed with the fat. °· Find a way of tracking calories that works for you. Get creative. Try different apps or programs if writing down calories does not work for you. °What are some portion control tips? °· Know how many calories are in a serving. This will help you know how many servings of a certain food you can have. °· Use a measuring cup to measure serving sizes. You could also try weighing out portions on a kitchen scale. With time, you will be able to estimate serving sizes for some foods. °· Take some time to put servings of different foods on your favorite plates, bowls, and cups so you know what a serving looks like. °· Try not to eat straight from a bag or box. Doing this can lead to overeating. Put the amount you would like to eat in a cup or on a plate to make sure you are eating the right portion. °· Use smaller plates, glasses, and bowls to prevent overeating. °· Try not to multitask (for example, watch TV or use your computer) while eating. If it is time to eat, sit down at a table and enjoy your food. This will help you to know when you are full. It will also help you to be aware of what you are eating and how much you are eating. °What are tips for following this plan? °Reading food labels °· Check the calorie count compared to the serving size. The serving size may be smaller than what you are used to  eating. °· Check the source of the calories. Make sure the food you are eating is high in vitamins and protein and low in saturated and trans fats. °Shopping °· Read nutrition labels while you shop. This will help you make healthy decisions before you decide to purchase your food. °· Make a grocery list and stick to it. °Cooking °· Try to cook your favorite foods in a healthier way. For example, try baking instead of frying. °· Use low-fat dairy products. °Meal planning °· Use more fruits and vegetables. Half of your plate should be   fruits and vegetables. °· Include lean proteins like poultry and fish. °How do I count calories when eating out? °· Ask for smaller portion sizes. °· Consider sharing an entree and sides instead of getting your own entree. °· If you get your own entree, eat only half. Ask for a box at the beginning of your meal and put the rest of your entree in it so you are not tempted to eat it. °· If calories are listed on the menu, choose the lower calorie options. °· Choose dishes that include vegetables, fruits, whole grains, low-fat dairy products, and lean protein. °· Choose items that are boiled, broiled, grilled, or steamed. Stay away from items that are buttered, battered, fried, or served with cream sauce. Items labeled "crispy" are usually fried, unless stated otherwise. °· Choose water, low-fat milk, unsweetened iced tea, or other drinks without added sugar. If you want an alcoholic beverage, choose a lower calorie option such as a glass of wine or light beer. °· Ask for dressings, sauces, and syrups on the side. These are usually high in calories, so you should limit the amount you eat. °· If you want a salad, choose a garden salad and ask for grilled meats. Avoid extra toppings like bacon, cheese, or fried items. Ask for the dressing on the side, or ask for olive oil and vinegar or lemon to use as dressing. °· Estimate how many servings of a food you are given. For example, a serving of  cooked rice is ½ cup or about the size of half a baseball. Knowing serving sizes will help you be aware of how much food you are eating at restaurants. The list below tells you how big or small some common portion sizes are based on everyday objects: °? 1 oz--4 stacked dice. °? 3 oz--1 deck of cards. °? 1 tsp--1 die. °? 1 Tbsp--½ a ping-pong ball. °? 2 Tbsp--1 ping-pong ball. °? ½ cup--½ baseball. °? 1 cup--1 baseball. °Summary °· Calorie counting means keeping track of how many calories you eat and drink each day. If you eat fewer calories than your body needs, you should lose weight. °· A healthy amount of weight to lose per week is usually 1-2 lb (0.5-0.9 kg). This usually means reducing your daily calorie intake by 500-750 calories. °· The number of calories in a food can be found on a Nutrition Facts label. If a food does not have a Nutrition Facts label, try to look up the calories online or ask your dietitian for help. °· Use your calories on foods and drinks that will fill you up, and not on foods and drinks that will leave you hungry. °· Use smaller plates, glasses, and bowls to prevent overeating. °This information is not intended to replace advice given to you by your health care provider. Make sure you discuss any questions you have with your health care provider. °Document Released: 09/01/2005 Document Revised: 05/21/2018 Document Reviewed: 08/01/2016 °Elsevier Patient Education © 2020 [employer] ° °

## 2019-06-17 NOTE — Progress Notes (Signed)
Pt presents for  Weight,B/P, B-12 injection. No side effects of medications-Phentermine or B-12. Weight gain  2 lbs. Encourage eating healthy and exercise.

## 2019-06-17 NOTE — Progress Notes (Signed)
SUBJECTIVE:  53 y.o. here for follow-up weight loss visit, previously seen 4 weeks ago. Denies any concerns and feels like medication is working for her. She has gained 1 lbs. She states she is working out more. She is back at the gym 3 times a week. She is using the cross trainer and treadmill for total work out time of 1hr 15 min. She states pants are looser. Discussed possibility of muscle gain. If she continues to gain next visit will get measurements. She denies any negative side effects from medication use.   OBJECTIVE:  BP 120/63   Pulse (!) 57   Ht 5' 4.5" (1.638 m)   Wt 217 lb (98.4 kg)   LMP 12/16/2015   BMI 36.67 kg/m   Body mass index is 36.67 kg/m. Patient appears well. ASSESSMENT:  Obesity- responding well to weight loss plan PLAN:  To continue with current medications. B12 1052mcg/ml injection given RTC in 4 weeks as planned  Philip Aspen,  CNM

## 2019-06-18 ENCOUNTER — Other Ambulatory Visit: Payer: Self-pay | Admitting: Family

## 2019-06-18 DIAGNOSIS — F418 Other specified anxiety disorders: Secondary | ICD-10-CM

## 2019-07-04 ENCOUNTER — Other Ambulatory Visit: Payer: Self-pay | Admitting: Family

## 2019-07-04 DIAGNOSIS — G4700 Insomnia, unspecified: Secondary | ICD-10-CM

## 2019-07-13 ENCOUNTER — Encounter: Payer: Self-pay | Admitting: Family Medicine

## 2019-07-13 ENCOUNTER — Ambulatory Visit (INDEPENDENT_AMBULATORY_CARE_PROVIDER_SITE_OTHER): Payer: Self-pay | Admitting: Family Medicine

## 2019-07-13 ENCOUNTER — Other Ambulatory Visit: Payer: Self-pay

## 2019-07-13 VITALS — Wt 215.0 lb

## 2019-07-13 DIAGNOSIS — B029 Zoster without complications: Secondary | ICD-10-CM

## 2019-07-13 DIAGNOSIS — F418 Other specified anxiety disorders: Secondary | ICD-10-CM

## 2019-07-13 MED ORDER — LIDOCAINE 5 % EX OINT: 1.0000 "application " | TOPICAL_OINTMENT | CUTANEOUS | 0 refills | Status: DC | PRN

## 2019-07-13 MED ORDER — VALACYCLOVIR HCL 1 G PO TABS: 1000.0000 mg | ORAL_TABLET | Freq: Three times a day (TID) | ORAL | 0 refills | Status: DC

## 2019-07-13 NOTE — Progress Notes (Signed)
Patient ID: [name], female   DOB: 1966/07/04, 53 y.o.   MRN: [id number]    Virtual Visit via video Note  This visit type was conducted due to national recommendations for restrictions regarding the COVID-19 pandemic (e.g. social distancing).  This format is felt to be most appropriate for this patient at this time.  All issues noted in this document were discussed and addressed.  No physical exam was performed (except for noted visual exam findings with Video Visits).   I connected with [name] today at 10:40 AM EDT by a video enabled telemedicine application or telephone and verified that I am speaking with the correct person using two identifiers. Location patient: home Location provider: work or home office Persons participating in the virtual visit: patient, provider  I discussed the limitations, risks, security and privacy concerns of performing an evaluation and management service by telephone and the availability of in person appointments. I also discussed with the patient that there may be a patient responsible charge related to this service. The patient expressed understanding and agreed to proceed.  HPI:  Patient and I connected via video due to rash on right calf.  Patient states rash is painful and does appear to have raised bumpy areas.  Patient states a friend of hers looked at the area who is in the medical field and told her she suspected possible shingles.  Patient states the rash does seem a little bit more tender today and the raised areas do look like tiny blisters.  Patient also has positive depression screening per CMA.  Patient does currently take Cymbalta and trazodone regularly for her depression.  Patient states she does have a lot going on in her life right now that is adding to her stress.  States her dad is dealing with medical issues and they are working on building her house, feels that a lot of her stress is added to her mood being down.  Feels  that when she gets through these more stressful time she will be in a better mindset.  Denies SI or HI.  States that she really feels additional stress is the cause as to why she is feeling a little bit more down.  Does have good support from family and friends.  No fever or chills.    ROS: See pertinent positives and negatives per HPI.  Past Medical History:  Diagnosis Date   Anemia    Anxiety and depression    Chronic back pain    DM2 (diabetes mellitus, type 2) (HCC)    improved since gastric bypass   Headache    stress/sinus - 2-3x/wk   History of left shoulder fracture    HLD (hyperlipidemia)    HTN (hypertension)    improved since gastric bypass   Hypothyroid    Left leg weakness    s/p back surgery   Obesity    S/P insertion of spinal cord stimulator    Wears contact lenses     Past Surgical History:  Procedure Laterality Date   Abd US - gallstones  5/03   BACK SURGERY  2009   decompressive laminectomy   BARIATRIC SURGERY  03/02/2012   gastric bypass; Dr Smitty CordsBruce   CHOLECYSTECTOMY  6/03   COLONOSCOPY WITH PROPOFOL N/A 05/01/2017   Procedure: COLONOSCOPY WITH PROPOFOL;  Surgeon: Midge MiniumWohl, Darren, MD;  [facility];  Service: Gastroenterology;  Laterality: N/A;   DILATION AND CURETTAGE OF UTERUS  1/05   ESOPHAGOGASTRODUODENOSCOPY (EGD) WITH PROPOFOL N/A  05/01/2017   Procedure: ESOPHAGOGASTRODUODENOSCOPY (EGD) WITH PROPOFOL;  Surgeon: Lucilla Lame, MD;  Location: [facility];  Service: Gastroenterology;  Laterality: N/A;   KNEE SURGERY     L - arthroscopic   LIVER BIOPSY  6/03   fatty liver   LUMBAR SPINE SURGERY  2015   Dr. Mick Sell   NASAL SINUS SURGERY     PANNICULECTOMY  08/2013   [facility], Dr. Chrisandra Carota   sleep study - apnea  04/2001   improved since gastric bypass    Family History  Problem Relation Age of Onset   Lung cancer Father    Diabetes Mother    Heart failure Mother    Diabetes Sister    Fibromyalgia  Sister    Colon cancer Maternal Aunt    Skin cancer Paternal Aunt    Skin cancer Paternal Uncle    Social History   Tobacco Use   Smoking status: Never Smoker   Smokeless tobacco: Never Used   Tobacco comment: tobacco use - no  Substance Use Topics   Alcohol use: No     Current Outpatient Medications:    acyclovir (ZOVIRAX) 400 MG tablet, Take 400 mg by mouth 2 (two) times daily as needed. , Disp: , Rfl:    albuterol (PROVENTIL HFA;VENTOLIN HFA) 108 (90 Base) MCG/ACT inhaler, Inhale 2 puffs into the lungs every 6 (six) hours as needed for wheezing or shortness of breath., Disp: 1 Inhaler, Rfl: 0   baclofen (LIORESAL) 10 MG tablet, Take 10 mg by mouth daily., Disp: , Rfl:    DULoxetine (CYMBALTA) 60 MG capsule, Take 60 mg by mouth daily., Disp: , Rfl:    fentaNYL (DURAGESIC - DOSED MCG/HR) 50 MCG/HR, APPLY 1 PATCH(ES) EVERY 72 HOURS BY TRANSDERMAL ROUTE., Disp: , Rfl: 0   gabapentin (NEURONTIN) 400 MG capsule, Take 800 mg by mouth 2 (two) times daily. , Disp: , Rfl:    levothyroxine (SYNTHROID) 100 MCG tablet, Take 1 tablet (100 mcg total) by mouth daily., Disp: 90 tablet, Rfl: 1   naloxone (NARCAN) nasal spray 4 mg/0.1 mL, Place 1 spray into the nose as needed (in case of too much pain patch in her system)., Disp: , Rfl:    phentermine 37.5 MG capsule, Take 1 capsule (37.5 mg total) by mouth every morning., Disp: 30 capsule, Rfl: 0   promethazine (PHENERGAN) 12.5 MG tablet, Take 12.5 mg by mouth every 6 (six) hours as needed. , Disp: , Rfl:    propranolol (INDERAL) 20 MG tablet, TAKE 1 TABLET BY MOUTH TWICE A DAY, Disp: 60 tablet, Rfl: 1   traZODone (DESYREL) 50 MG tablet, TAKE 0.5-1 TABLET BY MOUTH AT BEDTIME AS NEEDED FOR SLEEP, Disp: 90 tablet, Rfl: 2   ferumoxytol (FERAHEME) 510 MG/17ML SOLN injection, Inject 17 mLs (510 mg total) into the vein once for 1 dose. Infuse 510 mg over at least 15 min. Day ) and repeat  3-8 days later. Dilute full contents of vial ( 17  ml) per product insert instructions before use, Disp: 34 mL, Rfl: 0  EXAM:  GENERAL: alert, oriented, appears well and in no acute distress  HEENT: atraumatic, conjunttiva clear, no obvious abnormalities on inspection of external nose and ears  NECK: normal movements of the head and neck  LUNGS: on inspection no signs of respiratory distress, breathing rate appears normal, no obvious gross SOB, gasping or wheezing  CV: no obvious cyanosis  MS: moves all visible extremities without noticeable abnormality  SKIN: Red raised rash patch on  right LE, appears to be small blisters  PSYCH/NEURO: pleasant and cooperative, no obvious depression or anxiety, speech and thought processing grossly intact  ASSESSMENT AND PLAN:  Discussed the following assessment and plan:   Herpes zoster without complication - Plan: valACYclovir (VALTREX) 1000 MG tablet, lidocaine (XYLOCAINE) 5 % ointment  Depression with anxiety  Patient's rash does look suspicious for shingles.  She will take Valtrex 3 times daily for 7 days and use lidocaine ointment as needed. She will continue her current medications for depression anxiety of Cymbalta and trazodone.    I discussed the assessment and treatment plan with the patient. The patient was provided an opportunity to ask questions and all were answered. The patient agreed with the plan and demonstrated an understanding of the instructions.   The patient was advised to call back or seek an in-person evaluation if the symptoms worsen or if the condition fails to improve as anticipated.  Tracey Harries, FNP

## 2019-07-13 NOTE — Progress Notes (Signed)
OV for rash on right leg calf.  Patient has feelings of depression. PHQ-9 Score:  12

## 2019-07-18 ENCOUNTER — Telehealth: Payer: Self-pay

## 2019-07-18 ENCOUNTER — Other Ambulatory Visit: Payer: Self-pay

## 2019-07-18 NOTE — Telephone Encounter (Signed)
She has to come in for a weight and BP check to get the refill.   Thanks  Deneise Lever

## 2019-07-18 NOTE — Telephone Encounter (Signed)
Message sent to AT to refill med.

## 2019-07-19 ENCOUNTER — Other Ambulatory Visit: Payer: Self-pay | Admitting: Certified Nurse Midwife

## 2019-07-19 ENCOUNTER — Telehealth: Payer: Self-pay

## 2019-07-19 MED ORDER — PHENTERMINE HCL 37.5 MG PO CAPS: 37.5000 mg | ORAL_CAPSULE | ORAL | 0 refills | Status: DC

## 2019-07-19 NOTE — Telephone Encounter (Signed)
Order placed for 10 tablets.    Thanks,  Deneise Lever

## 2019-07-19 NOTE — Telephone Encounter (Signed)
mychart message sent to patient- AT sent in enough phentermine to get her through until her next appointment.

## 2019-07-19 NOTE — Progress Notes (Signed)
Order placed for 10 tablets phentermine. She has a follow up appointment but will run out before she comes in for her appointment.   Philip Aspen, CNM

## 2019-07-22 ENCOUNTER — Encounter: Payer: Self-pay | Admitting: Certified Nurse Midwife

## 2019-07-25 ENCOUNTER — Ambulatory Visit: Payer: Self-pay | Admitting: Certified Nurse Midwife

## 2019-07-25 ENCOUNTER — Encounter: Payer: Self-pay | Admitting: Certified Nurse Midwife

## 2019-07-25 ENCOUNTER — Other Ambulatory Visit: Payer: Self-pay

## 2019-07-25 VITALS — BP 135/69 | HR 62 | Ht 64.5 in | Wt 216.3 lb

## 2019-07-25 DIAGNOSIS — E669 Obesity, unspecified: Secondary | ICD-10-CM

## 2019-07-25 DIAGNOSIS — Z7689 Persons encountering health services in other specified circumstances: Secondary | ICD-10-CM

## 2019-07-25 DIAGNOSIS — Z79899 Other long term (current) drug therapy: Secondary | ICD-10-CM

## 2019-07-25 DIAGNOSIS — Z6836 Body mass index (BMI) 36.0-36.9, adult: Secondary | ICD-10-CM

## 2019-07-25 MED ORDER — PHENTERMINE HCL 37.5 MG PO TABS: 37.5000 mg | ORAL_TABLET | Freq: Every day | ORAL | 0 refills | Status: DC

## 2019-07-25 MED ORDER — CYANOCOBALAMIN 1000 MCG/ML IJ SOLN
1000.0000 ug | Freq: Once | INTRAMUSCULAR | Status: AC
  Administered 2019-07-25: 1000 ug via INTRAMUSCULAR

## 2019-07-25 NOTE — Patient Instructions (Signed)
° °Calorie Counting for Weight Loss °Calories are units of energy. Your body needs a certain amount of calories from food to keep you going throughout the day. When you eat more calories than your body needs, your body stores the extra calories as fat. When you eat fewer calories than your body needs, your body burns fat to get the energy it needs. °Calorie counting means keeping track of how many calories you eat and drink each day. Calorie counting can be helpful if you need to lose weight. If you make sure to eat fewer calories than your body needs, you should lose weight. Ask your health care provider what a healthy weight is for you. °For calorie counting to work, you will need to eat the right number of calories in a day in order to lose a healthy amount of weight per week. A dietitian can help you determine how many calories you need in a day and will give you suggestions on how to reach your calorie goal. °· A healthy amount of weight to lose per week is usually 1-2 lb (0.5-0.9 kg). This usually means that your daily calorie intake should be reduced by 500-750 calories. °· Eating 1,200 - 1,500 calories per day can help most women lose weight. °· Eating 1,500 - 1,800 calories per day can help most men lose weight. °What is my plan? °My goal is to have __________ calories per day. °If I have this many calories per day, I should lose around __________ pounds per week. °What do I need to know about calorie counting? °In order to meet your daily calorie goal, you will need to: °· Find out how many calories are in each food you would like to eat. Try to do this before you eat. °· Decide how much of the food you plan to eat. °· Write down what you ate and how many calories it had. Doing this is called keeping a food log. °To successfully lose weight, it is important to balance calorie counting with a healthy lifestyle that includes regular activity. Aim for 150 minutes of moderate exercise (such as walking) or 75  minutes of vigorous exercise (such as running) each week. °Where do I find calorie information? ° °The number of calories in a food can be found on a Nutrition Facts label. If a food does not have a Nutrition Facts label, try to look up the calories online or ask your dietitian for help. °Remember that calories are listed per serving. If you choose to have more than one serving of a food, you will have to multiply the calories per serving by the amount of servings you plan to eat. For example, the label on a package of bread might say that a serving size is 1 slice and that there are 90 calories in a serving. If you eat 1 slice, you will have eaten 90 calories. If you eat 2 slices, you will have eaten 180 calories. °How do I keep a food log? °Immediately after each meal, record the following information in your food log: °· What you ate. Don't forget to include toppings, sauces, and other extras on the food. °· How much you ate. This can be measured in cups, ounces, or number of items. °· How many calories each food and drink had. °· The total number of calories in the meal. °Keep your food log near you, such as in a small notebook in your pocket, or use a mobile app or website. Some programs will   calculate calories for you and show you how many calories you have left for the day to meet your goal. °What are some calorie counting tips? ° °· Use your calories on foods and drinks that will fill you up and not leave you hungry: °? Some examples of foods that fill you up are nuts and nut butters, vegetables, lean proteins, and high-fiber foods like whole grains. High-fiber foods are foods with more than 5 g fiber per serving. °? Drinks such as sodas, specialty coffee drinks, alcohol, and juices have a lot of calories, yet do not fill you up. °· Eat nutritious foods and avoid empty calories. Empty calories are calories you get from foods or beverages that do not have many vitamins or protein, such as candy, sweets, and  soda. It is better to have a nutritious high-calorie food (such as an avocado) than a food with few nutrients (such as a bag of chips). °· Know how many calories are in the foods you eat most often. This will help you calculate calorie counts faster. °· Pay attention to calories in drinks. Low-calorie drinks include water and unsweetened drinks. °· Pay attention to nutrition labels for "low fat" or "fat free" foods. These foods sometimes have the same amount of calories or more calories than the full fat versions. They also often have added sugar, starch, or salt, to make up for flavor that was removed with the fat. °· Find a way of tracking calories that works for you. Get creative. Try different apps or programs if writing down calories does not work for you. °What are some portion control tips? °· Know how many calories are in a serving. This will help you know how many servings of a certain food you can have. °· Use a measuring cup to measure serving sizes. You could also try weighing out portions on a kitchen scale. With time, you will be able to estimate serving sizes for some foods. °· Take some time to put servings of different foods on your favorite plates, bowls, and cups so you know what a serving looks like. °· Try not to eat straight from a bag or box. Doing this can lead to overeating. Put the amount you would like to eat in a cup or on a plate to make sure you are eating the right portion. °· Use smaller plates, glasses, and bowls to prevent overeating. °· Try not to multitask (for example, watch TV or use your computer) while eating. If it is time to eat, sit down at a table and enjoy your food. This will help you to know when you are full. It will also help you to be aware of what you are eating and how much you are eating. °What are tips for following this plan? °Reading food labels °· Check the calorie count compared to the serving size. The serving size may be smaller than what you are used to  eating. °· Check the source of the calories. Make sure the food you are eating is high in vitamins and protein and low in saturated and trans fats. °Shopping °· Read nutrition labels while you shop. This will help you make healthy decisions before you decide to purchase your food. °· Make a grocery list and stick to it. °Cooking °· Try to cook your favorite foods in a healthier way. For example, try baking instead of frying. °· Use low-fat dairy products. °Meal planning °· Use more fruits and vegetables. Half of your plate should be   fruits and vegetables. °· Include lean proteins like poultry and fish. °How do I count calories when eating out? °· Ask for smaller portion sizes. °· Consider sharing an entree and sides instead of getting your own entree. °· If you get your own entree, eat only half. Ask for a box at the beginning of your meal and put the rest of your entree in it so you are not tempted to eat it. °· If calories are listed on the menu, choose the lower calorie options. °· Choose dishes that include vegetables, fruits, whole grains, low-fat dairy products, and lean protein. °· Choose items that are boiled, broiled, grilled, or steamed. Stay away from items that are buttered, battered, fried, or served with cream sauce. Items labeled "crispy" are usually fried, unless stated otherwise. °· Choose water, low-fat milk, unsweetened iced tea, or other drinks without added sugar. If you want an alcoholic beverage, choose a lower calorie option such as a glass of wine or light beer. °· Ask for dressings, sauces, and syrups on the side. These are usually high in calories, so you should limit the amount you eat. °· If you want a salad, choose a garden salad and ask for grilled meats. Avoid extra toppings like bacon, cheese, or fried items. Ask for the dressing on the side, or ask for olive oil and vinegar or lemon to use as dressing. °· Estimate how many servings of a food you are given. For example, a serving of  cooked rice is ½ cup or about the size of half a baseball. Knowing serving sizes will help you be aware of how much food you are eating at restaurants. The list below tells you how big or small some common portion sizes are based on everyday objects: °? 1 oz--4 stacked dice. °? 3 oz--1 deck of cards. °? 1 tsp--1 die. °? 1 Tbsp--½ a ping-pong ball. °? 2 Tbsp--1 ping-pong ball. °? ½ cup--½ baseball. °? 1 cup--1 baseball. °Summary °· Calorie counting means keeping track of how many calories you eat and drink each day. If you eat fewer calories than your body needs, you should lose weight. °· A healthy amount of weight to lose per week is usually 1-2 lb (0.5-0.9 kg). This usually means reducing your daily calorie intake by 500-750 calories. °· The number of calories in a food can be found on a Nutrition Facts label. If a food does not have a Nutrition Facts label, try to look up the calories online or ask your dietitian for help. °· Use your calories on foods and drinks that will fill you up, and not on foods and drinks that will leave you hungry. °· Use smaller plates, glasses, and bowls to prevent overeating. °This information is not intended to replace advice given to you by your health care provider. Make sure you discuss any questions you have with your health care provider. °Document Released: 09/01/2005 Document Revised: 05/21/2018 Document Reviewed: 08/01/2016 °Elsevier Patient Education © 2020 [employer] ° °

## 2019-07-25 NOTE — Progress Notes (Signed)
Pt presents for  Weight,B/P, B-12 injection. No side effects of medications-Phentermine or B-12. Weight gain _1.5  lbs. Encourage eating healthy and exercise.

## 2019-07-25 NOTE — Progress Notes (Signed)
SUBJECTIVE:  53 y.o. here for follow-up weight loss visit, previously seen 4 weeks ago. Denies any concerns and feels like medication is working. She has some upcoming life stressors that may effect her. She has to have surgery on her shoulder, her step dad was told he has cancer , and her house is 3 months behind in being completed. Discussed importance of consistency with diet and exercise. She verbalizes understanding and agreement. She lost 1/2 a pound this month.   OBJECTIVE:  BP 135/69   Pulse 62   Ht 5' 4.5" (1.638 m)   Wt 216 lb 5 oz (98.1 kg)   LMP 12/16/2015   BMI 36.56 kg/m   Body mass index is 36.56 kg/m. Patient appears well. ASSESSMENT:  Obesity- responding well to weight loss plan PLAN:  To continue with current medications. B12 1022mcg/ml injection given RTC in 4 weeks as planned  Philip Aspen, CNM

## 2019-08-03 ENCOUNTER — Other Ambulatory Visit: Payer: Self-pay | Admitting: Family

## 2019-08-03 DIAGNOSIS — F418 Other specified anxiety disorders: Secondary | ICD-10-CM

## 2019-08-12 ENCOUNTER — Other Ambulatory Visit: Payer: Self-pay

## 2019-08-15 ENCOUNTER — Other Ambulatory Visit: Payer: Self-pay

## 2019-08-15 ENCOUNTER — Inpatient Hospital Stay: Payer: Self-pay | Attending: Oncology

## 2019-08-15 DIAGNOSIS — D508 Other iron deficiency anemias: Secondary | ICD-10-CM | POA: Insufficient documentation

## 2019-08-15 DIAGNOSIS — Z9884 Bariatric surgery status: Secondary | ICD-10-CM | POA: Insufficient documentation

## 2019-08-15 LAB — CBC WITH DIFFERENTIAL/PLATELET
Abs Immature Granulocytes: 0.01 10*3/uL (ref 0.00–0.07)
Basophils Absolute: 0 10*3/uL (ref 0.0–0.1)
Basophils Relative: 1 %
Eosinophils Absolute: 0.2 10*3/uL (ref 0.0–0.5)
Eosinophils Relative: 5 %
HCT: 38.5 % (ref 36.0–46.0)
Hemoglobin: 11.9 g/dL — ABNORMAL LOW (ref 12.0–15.0)
Immature Granulocytes: 0 %
Lymphocytes Relative: 31 %
Lymphs Abs: 1.7 10*3/uL (ref 0.7–4.0)
MCH: 31.2 pg (ref 26.0–34.0)
MCHC: 30.9 g/dL (ref 30.0–36.0)
MCV: 100.8 fL — ABNORMAL HIGH (ref 80.0–100.0)
Monocytes Absolute: 0.5 10*3/uL (ref 0.1–1.0)
Monocytes Relative: 10 %
Neutro Abs: 2.9 10*3/uL (ref 1.7–7.7)
Neutrophils Relative %: 53 %
Platelets: 163 10*3/uL (ref 150–400)
RBC: 3.82 MIL/uL — ABNORMAL LOW (ref 3.87–5.11)
RDW: 13.8 % (ref 11.5–15.5)
WBC: 5.4 10*3/uL (ref 4.0–10.5)
nRBC: 0 % (ref 0.0–0.2)

## 2019-08-15 LAB — VITAMIN B12: Vitamin B-12: 452 pg/mL (ref 180–914)

## 2019-08-15 LAB — IRON AND TIBC
Iron: 104 ug/dL (ref 28–170)
Saturation Ratios: 33 % — ABNORMAL HIGH (ref 10.4–31.8)
TIBC: 319 ug/dL (ref 250–450)
UIBC: 215 ug/dL

## 2019-08-15 LAB — FERRITIN: Ferritin: 114 ng/mL (ref 11–307)

## 2019-08-25 ENCOUNTER — Other Ambulatory Visit: Payer: Self-pay

## 2019-08-25 ENCOUNTER — Telehealth: Payer: Self-pay

## 2019-08-25 MED ORDER — CYANOCOBALAMIN 1000 MCG/ML IJ SOLN: 1000.0000 ug | Freq: Once | INTRAMUSCULAR | 4 refills | Status: AC

## 2019-08-25 NOTE — Telephone Encounter (Signed)
Patient called concerned that the [facility], doesn't have the medication she needs in stock.  Patient is scheduled to come in [facility]mo [facility] injection. [facility] [phone]

## 2019-08-25 NOTE — Telephone Encounter (Signed)
Refill sent to preferred pharmacy. Mychart message sent 

## 2019-08-26 ENCOUNTER — Encounter: Payer: Self-pay | Admitting: Certified Nurse Midwife

## 2019-08-29 ENCOUNTER — Encounter: Payer: Self-pay | Admitting: Family

## 2019-08-29 ENCOUNTER — Other Ambulatory Visit: Payer: Self-pay | Admitting: Family

## 2019-08-29 ENCOUNTER — Other Ambulatory Visit: Payer: Self-pay

## 2019-08-29 MED ORDER — ACYCLOVIR 400 MG PO TABS: 400.0000 mg | ORAL_TABLET | Freq: Every day | ORAL | 1 refills | Status: AC

## 2019-09-02 ENCOUNTER — Other Ambulatory Visit: Payer: Self-pay

## 2019-09-02 ENCOUNTER — Other Ambulatory Visit: Payer: Self-pay | Admitting: Certified Nurse Midwife

## 2019-09-02 MED ORDER — PHENTERMINE HCL 37.5 MG PO TABS: 37.5000 mg | ORAL_TABLET | Freq: Every day | ORAL | 0 refills | Status: DC

## 2019-09-02 NOTE — Telephone Encounter (Signed)
Please let her know I sent in enough to get her to her next appointment.   Thanks  Deneise Lever

## 2019-09-02 NOTE — Telephone Encounter (Signed)
See MCM for response.

## 2019-09-02 NOTE — Telephone Encounter (Signed)
[name], Please see request, last OV was 11/9 with you.  Patient was scheduled on 12/11 but called to reschedule for 09/19/2019.  Thanks. [name]

## 2019-09-02 NOTE — Progress Notes (Signed)
Pt had to reschedule appointment, none available until 09/19/19, refill placed for 20 tablets.   Philip Aspen, CNM

## 2019-09-12 ENCOUNTER — Ambulatory Visit: Payer: Self-pay | Admitting: Gastroenterology

## 2019-09-12 ENCOUNTER — Other Ambulatory Visit: Payer: Self-pay

## 2019-09-12 DIAGNOSIS — Z23 Encounter for immunization: Secondary | ICD-10-CM

## 2019-09-13 ENCOUNTER — Other Ambulatory Visit: Payer: Self-pay

## 2019-09-19 ENCOUNTER — Ambulatory Visit: Payer: BC Managed Care – PPO | Admitting: Certified Nurse Midwife

## 2019-09-19 ENCOUNTER — Other Ambulatory Visit: Payer: Self-pay

## 2019-09-19 ENCOUNTER — Encounter: Payer: Self-pay | Admitting: Certified Nurse Midwife

## 2019-09-19 VITALS — BP 139/65 | HR 60 | Ht 64.5 in | Wt 218.3 lb

## 2019-09-19 DIAGNOSIS — Z6841 Body Mass Index (BMI) 40.0 and over, adult: Secondary | ICD-10-CM

## 2019-09-19 DIAGNOSIS — Z7689 Persons encountering health services in other specified circumstances: Secondary | ICD-10-CM

## 2019-09-19 MED ORDER — CYANOCOBALAMIN 1000 MCG/ML IJ SOLN
1000.0000 ug | Freq: Once | INTRAMUSCULAR | Status: AC
  Administered 2019-09-19: 14:00:00 1000 ug via INTRAMUSCULAR

## 2019-09-19 NOTE — Addendum Note (Signed)
Addended by: Brooke Dare on: 09/19/2019 02:02 PM   Modules accepted: Orders

## 2019-09-19 NOTE — Progress Notes (Signed)
SUBJECTIVE:  54 y.o. here for follow-up weight loss visit, previously seen 4 weeks ago. Denies any concerns and feels like medication is not having any negative side effect. Discussed with [name] that to date she has not lost any weight and that it has been 5 months since starting the plan. Reviewed recommended treatment of a few wks up to a year with good results. Discussed goal of 5% in 3 months. Given that the pt has not lost any weight. The benefits do not out weight risk. Discussed restarting plan at later time when she is able to make dietary and exercise changes as need for weight loss. She verbalizes understanding. Offered referral to [facility]. She declines at this time. Will let me know if she changes her mind.   OBJECTIVE:  BP 139/65   Pulse 60   Ht 5' 4.5" (1.638 m)   Wt 281 lb 5 oz (127.6 kg)   LMP 12/16/2015   BMI 47.54 kg/m   Body mass index is 47.54 kg/m. Patient appears well. ASSESSMENT:  Obesity- responding poorly to weight loss plan PLAN:  Medications stopped.  RTC : prn or for annual exam.   Doreene Burke, CNM

## 2019-09-19 NOTE — Patient Instructions (Signed)

## 2019-09-21 ENCOUNTER — Other Ambulatory Visit: Payer: Self-pay | Admitting: Family

## 2019-09-21 DIAGNOSIS — F418 Other specified anxiety disorders: Secondary | ICD-10-CM

## 2019-09-21 DIAGNOSIS — E039 Hypothyroidism, unspecified: Secondary | ICD-10-CM

## 2019-09-29 DIAGNOSIS — M25511 Pain in right shoulder: Secondary | ICD-10-CM | POA: Diagnosis not present

## 2019-09-29 DIAGNOSIS — S42202D Unspecified fracture of upper end of left humerus, subsequent encounter for fracture with routine healing: Secondary | ICD-10-CM | POA: Diagnosis not present

## 2019-09-29 DIAGNOSIS — G8929 Other chronic pain: Secondary | ICD-10-CM | POA: Diagnosis not present

## 2019-09-29 DIAGNOSIS — Z01818 Encounter for other preprocedural examination: Secondary | ICD-10-CM | POA: Diagnosis not present

## 2019-10-04 DIAGNOSIS — Z20822 Contact with and (suspected) exposure to covid-19: Secondary | ICD-10-CM | POA: Diagnosis not present

## 2019-10-04 DIAGNOSIS — Z01812 Encounter for preprocedural laboratory examination: Secondary | ICD-10-CM | POA: Diagnosis not present

## 2019-10-06 DIAGNOSIS — E039 Hypothyroidism, unspecified: Secondary | ICD-10-CM | POA: Diagnosis not present

## 2019-10-06 DIAGNOSIS — S43102A Unspecified dislocation of left acromioclavicular joint, initial encounter: Secondary | ICD-10-CM | POA: Diagnosis not present

## 2019-10-06 DIAGNOSIS — G8918 Other acute postprocedural pain: Secondary | ICD-10-CM | POA: Diagnosis not present

## 2019-10-06 DIAGNOSIS — M7502 Adhesive capsulitis of left shoulder: Secondary | ICD-10-CM | POA: Diagnosis not present

## 2019-10-06 DIAGNOSIS — Z6837 Body mass index (BMI) 37.0-37.9, adult: Secondary | ICD-10-CM | POA: Diagnosis not present

## 2019-10-06 DIAGNOSIS — M7522 Bicipital tendinitis, left shoulder: Secondary | ICD-10-CM | POA: Diagnosis not present

## 2019-10-06 DIAGNOSIS — S46211A Strain of muscle, fascia and tendon of other parts of biceps, right arm, initial encounter: Secondary | ICD-10-CM | POA: Diagnosis not present

## 2019-10-06 DIAGNOSIS — M7542 Impingement syndrome of left shoulder: Secondary | ICD-10-CM | POA: Diagnosis not present

## 2019-10-06 DIAGNOSIS — Z91040 Latex allergy status: Secondary | ICD-10-CM | POA: Diagnosis not present

## 2019-10-06 DIAGNOSIS — Z880 Allergy status to penicillin: Secondary | ICD-10-CM | POA: Diagnosis not present

## 2019-10-06 DIAGNOSIS — S42202A Unspecified fracture of upper end of left humerus, initial encounter for closed fracture: Secondary | ICD-10-CM | POA: Diagnosis not present

## 2019-10-06 DIAGNOSIS — E119 Type 2 diabetes mellitus without complications: Secondary | ICD-10-CM | POA: Diagnosis not present

## 2019-10-06 DIAGNOSIS — D649 Anemia, unspecified: Secondary | ICD-10-CM | POA: Diagnosis not present

## 2019-10-06 DIAGNOSIS — I10 Essential (primary) hypertension: Secondary | ICD-10-CM | POA: Diagnosis not present

## 2019-10-06 DIAGNOSIS — M19012 Primary osteoarthritis, left shoulder: Secondary | ICD-10-CM | POA: Diagnosis not present

## 2019-10-06 DIAGNOSIS — M25512 Pain in left shoulder: Secondary | ICD-10-CM | POA: Diagnosis not present

## 2019-10-06 HISTORY — PX: SHOULDER ARTHROSCOPY: SHX128

## 2019-10-21 DIAGNOSIS — M7502 Adhesive capsulitis of left shoulder: Secondary | ICD-10-CM | POA: Diagnosis not present

## 2019-10-21 DIAGNOSIS — M25412 Effusion, left shoulder: Secondary | ICD-10-CM | POA: Diagnosis not present

## 2019-10-26 DIAGNOSIS — M7502 Adhesive capsulitis of left shoulder: Secondary | ICD-10-CM | POA: Diagnosis not present

## 2019-10-28 DIAGNOSIS — M25412 Effusion, left shoulder: Secondary | ICD-10-CM | POA: Diagnosis not present

## 2019-10-28 DIAGNOSIS — M7502 Adhesive capsulitis of left shoulder: Secondary | ICD-10-CM | POA: Diagnosis not present

## 2019-11-02 DIAGNOSIS — Z5181 Encounter for therapeutic drug level monitoring: Secondary | ICD-10-CM | POA: Diagnosis not present

## 2019-11-02 DIAGNOSIS — Z79899 Other long term (current) drug therapy: Secondary | ICD-10-CM | POA: Diagnosis not present

## 2019-11-02 DIAGNOSIS — S42202D Unspecified fracture of upper end of left humerus, subsequent encounter for fracture with routine healing: Secondary | ICD-10-CM | POA: Diagnosis not present

## 2019-11-02 DIAGNOSIS — G894 Chronic pain syndrome: Secondary | ICD-10-CM | POA: Diagnosis not present

## 2019-11-02 DIAGNOSIS — G8929 Other chronic pain: Secondary | ICD-10-CM | POA: Diagnosis not present

## 2019-11-02 DIAGNOSIS — S42202A Unspecified fracture of upper end of left humerus, initial encounter for closed fracture: Secondary | ICD-10-CM | POA: Diagnosis not present

## 2019-11-07 ENCOUNTER — Encounter: Payer: Self-pay | Admitting: Oncology

## 2019-11-08 ENCOUNTER — Other Ambulatory Visit: Payer: Self-pay | Admitting: *Deleted

## 2019-11-08 DIAGNOSIS — R748 Abnormal levels of other serum enzymes: Secondary | ICD-10-CM

## 2019-11-09 DIAGNOSIS — M7502 Adhesive capsulitis of left shoulder: Secondary | ICD-10-CM | POA: Diagnosis not present

## 2019-11-09 DIAGNOSIS — M25412 Effusion, left shoulder: Secondary | ICD-10-CM | POA: Diagnosis not present

## 2019-11-11 DIAGNOSIS — M25412 Effusion, left shoulder: Secondary | ICD-10-CM | POA: Diagnosis not present

## 2019-11-11 DIAGNOSIS — M7502 Adhesive capsulitis of left shoulder: Secondary | ICD-10-CM | POA: Diagnosis not present

## 2019-11-14 ENCOUNTER — Other Ambulatory Visit: Payer: Self-pay | Admitting: Family

## 2019-11-14 DIAGNOSIS — F418 Other specified anxiety disorders: Secondary | ICD-10-CM

## 2019-11-15 ENCOUNTER — Inpatient Hospital Stay: Payer: BC Managed Care – PPO

## 2019-11-15 ENCOUNTER — Encounter: Payer: Self-pay | Admitting: Oncology

## 2019-11-15 ENCOUNTER — Other Ambulatory Visit: Payer: Self-pay

## 2019-11-15 ENCOUNTER — Inpatient Hospital Stay: Payer: BC Managed Care – PPO | Attending: Oncology | Admitting: Oncology

## 2019-11-15 DIAGNOSIS — Z833 Family history of diabetes mellitus: Secondary | ICD-10-CM | POA: Insufficient documentation

## 2019-11-15 DIAGNOSIS — I10 Essential (primary) hypertension: Secondary | ICD-10-CM | POA: Insufficient documentation

## 2019-11-15 DIAGNOSIS — Z801 Family history of malignant neoplasm of trachea, bronchus and lung: Secondary | ICD-10-CM | POA: Insufficient documentation

## 2019-11-15 DIAGNOSIS — R748 Abnormal levels of other serum enzymes: Secondary | ICD-10-CM

## 2019-11-15 DIAGNOSIS — Z800 Family history of malignant neoplasm of digestive organs: Secondary | ICD-10-CM | POA: Insufficient documentation

## 2019-11-15 DIAGNOSIS — F419 Anxiety disorder, unspecified: Secondary | ICD-10-CM | POA: Diagnosis not present

## 2019-11-15 DIAGNOSIS — Z9884 Bariatric surgery status: Secondary | ICD-10-CM | POA: Insufficient documentation

## 2019-11-15 DIAGNOSIS — R948 Abnormal results of function studies of other organs and systems: Secondary | ICD-10-CM | POA: Diagnosis not present

## 2019-11-15 DIAGNOSIS — R7989 Other specified abnormal findings of blood chemistry: Secondary | ICD-10-CM

## 2019-11-15 DIAGNOSIS — E538 Deficiency of other specified B group vitamins: Secondary | ICD-10-CM | POA: Diagnosis not present

## 2019-11-15 DIAGNOSIS — E039 Hypothyroidism, unspecified: Secondary | ICD-10-CM | POA: Diagnosis not present

## 2019-11-15 DIAGNOSIS — D509 Iron deficiency anemia, unspecified: Secondary | ICD-10-CM

## 2019-11-15 DIAGNOSIS — E785 Hyperlipidemia, unspecified: Secondary | ICD-10-CM | POA: Diagnosis not present

## 2019-11-15 DIAGNOSIS — Z8249 Family history of ischemic heart disease and other diseases of the circulatory system: Secondary | ICD-10-CM | POA: Insufficient documentation

## 2019-11-15 DIAGNOSIS — E119 Type 2 diabetes mellitus without complications: Secondary | ICD-10-CM | POA: Diagnosis not present

## 2019-11-15 DIAGNOSIS — F329 Major depressive disorder, single episode, unspecified: Secondary | ICD-10-CM | POA: Diagnosis not present

## 2019-11-15 DIAGNOSIS — D508 Other iron deficiency anemias: Secondary | ICD-10-CM | POA: Insufficient documentation

## 2019-11-15 DIAGNOSIS — R945 Abnormal results of liver function studies: Secondary | ICD-10-CM | POA: Diagnosis not present

## 2019-11-15 DIAGNOSIS — Z79899 Other long term (current) drug therapy: Secondary | ICD-10-CM | POA: Diagnosis not present

## 2019-11-15 DIAGNOSIS — K283 Acute gastrojejunal ulcer without hemorrhage or perforation: Secondary | ICD-10-CM | POA: Insufficient documentation

## 2019-11-15 LAB — CBC WITH DIFFERENTIAL/PLATELET
Abs Immature Granulocytes: 0.01 10*3/uL (ref 0.00–0.07)
Basophils Absolute: 0 10*3/uL (ref 0.0–0.1)
Basophils Relative: 0 %
Eosinophils Absolute: 0.3 10*3/uL (ref 0.0–0.5)
Eosinophils Relative: 7 %
HCT: 38.8 % (ref 36.0–46.0)
Hemoglobin: 12.1 g/dL (ref 12.0–15.0)
Immature Granulocytes: 0 %
Lymphocytes Relative: 31 %
Lymphs Abs: 1.5 10*3/uL (ref 0.7–4.0)
MCH: 30.6 pg (ref 26.0–34.0)
MCHC: 31.2 g/dL (ref 30.0–36.0)
MCV: 98.2 fL (ref 80.0–100.0)
Monocytes Absolute: 0.4 10*3/uL (ref 0.1–1.0)
Monocytes Relative: 8 %
Neutro Abs: 2.5 10*3/uL (ref 1.7–7.7)
Neutrophils Relative %: 54 %
Platelets: 148 10*3/uL — ABNORMAL LOW (ref 150–400)
RBC: 3.95 MIL/uL (ref 3.87–5.11)
RDW: 13.1 % (ref 11.5–15.5)
WBC: 4.8 10*3/uL (ref 4.0–10.5)
nRBC: 0 % (ref 0.0–0.2)

## 2019-11-15 LAB — COMPREHENSIVE METABOLIC PANEL
ALT: 58 U/L — ABNORMAL HIGH (ref 0–44)
AST: 46 U/L — ABNORMAL HIGH (ref 15–41)
Albumin: 3.4 g/dL — ABNORMAL LOW (ref 3.5–5.0)
Alkaline Phosphatase: 91 U/L (ref 38–126)
Anion gap: 9 (ref 5–15)
BUN: 11 mg/dL (ref 6–20)
CO2: 28 mmol/L (ref 22–32)
Calcium: 8.9 mg/dL (ref 8.9–10.3)
Chloride: 105 mmol/L (ref 98–111)
Creatinine, Ser: 0.75 mg/dL (ref 0.44–1.00)
GFR calc Af Amer: >60 mL/min (ref >60)
GFR calc non Af Amer: >60 mL/min (ref >60)
Glucose, Bld: 115 mg/dL — ABNORMAL HIGH (ref 70–99)
Potassium: 4 mmol/L (ref 3.5–5.1)
Sodium: 142 mmol/L (ref 135–145)
Total Bilirubin: 0.4 mg/dL (ref 0.3–1.2)
Total Protein: 6.7 g/dL (ref 6.5–8.1)

## 2019-11-15 LAB — IRON AND TIBC
Iron: 154 ug/dL (ref 28–170)
Saturation Ratios: 50 % — ABNORMAL HIGH (ref 10.4–31.8)
TIBC: 307 ug/dL (ref 250–450)
UIBC: 153 ug/dL

## 2019-11-15 LAB — FERRITIN: Ferritin: 95 ng/mL (ref 11–307)

## 2019-11-15 LAB — VITAMIN B12: Vitamin B-12: 269 pg/mL (ref 180–914)

## 2019-11-15 NOTE — Progress Notes (Signed)
Patient stated that she had been doing well with no complaints. 

## 2019-11-17 ENCOUNTER — Encounter: Payer: Self-pay | Admitting: Oncology

## 2019-11-17 NOTE — Progress Notes (Signed)
I connected with [name] on 11/17/19 at  2:15 PM EST by video enabled telemedicine visit and verified that I am speaking with the correct person using two identifiers.   I discussed the limitations, risks, security and privacy concerns of performing an evaluation and management service by telemedicine and the availability of in-person appointments. I also discussed with the patient that there may be a patient responsible charge related to this service. The patient expressed understanding and agreed to proceed.  Other persons participating in the visit and their role in the encounter:  none  Patient's location:  home Provider's location:  work  Stage manager Complaint:  1.  Follow-up of elevated alkaline phosphatase 2.  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  She subsequently did not follow-up.  She has been referred to me for elevated alkaline phosphatase level.  She currently sees Dr. Servando Snare and was noted to have isolated elevated alkaline phosphatase.  GGT is normal. She was noted to have a low vitamin D level of 19 in May 2020.  PTH and calcium levels were normal.  Fractionated alkaline phosphatase showed normal bone intestinal and liver fraction  Interval history: Patient reports doing well.  Appetite and weight have been stable.  Denies any specific complaints at this time   Review of Systems  Constitutional: Negative for chills, fever, malaise/fatigue and weight loss.  HENT: Negative for congestion, ear discharge and nosebleeds.   Eyes: Negative for blurred vision.  Respiratory: Negative for cough, hemoptysis, sputum production, shortness of breath and wheezing.   Cardiovascular: Negative for chest pain, palpitations, orthopnea and claudication.   Gastrointestinal: Negative for abdominal pain, blood in stool, constipation, diarrhea, heartburn, melena, nausea and vomiting.  Genitourinary: Negative for dysuria, flank pain, frequency, hematuria and urgency.  Musculoskeletal: Negative for back pain, joint pain and myalgias.  Skin: Negative for rash.  Neurological: Negative for dizziness, tingling, focal weakness, seizures, weakness and headaches.  Endo/Heme/Allergies: Does not bruise/bleed easily.  Psychiatric/Behavioral: Negative for depression and suicidal ideas. The patient does not have insomnia.     Allergies  Allergen Reactions  . Latex Rash  . Penicillins Itching and Swelling    Has patient had a PCN reaction causing immediate rash, facial/tongue/throat swelling, SOB or lightheadedness with hypotension: yes Has patient had a PCN reaction causing severe rash involving mucus membranes or skin necrosis: no Has patient had a PCN reaction that required hospitalization : yes ed visit Has patient had a PCN reaction occurring within the last 10 years: yes If all of the above answers are "NO", then may proceed with Cephalosporin use.     Past Medical History:  Diagnosis Date  . Anemia   . Anxiety and depression   . Chronic back pain   . DM2 (diabetes mellitus, type 2) (HCC)    improved since gastric bypass  . Headache    stress/sinus - 2-3x/wk  . History of left shoulder fracture   . HLD (hyperlipidemia)   . HTN (hypertension)    improved since gastric bypass  . Hypothyroid   . Left leg weakness    s/p back surgery  . Obesity   . S/P insertion of spinal cord stimulator   . Wears contact lenses     Past Surgical History:  Procedure Laterality Date  .  Abd [place] - gallstones  5/03  . BACK SURGERY  2009   decompressive laminectomy  . BARIATRIC SURGERY  03/02/2012   gastric bypass; Dr Smitty Cords  . CHOLECYSTECTOMY  6/03  . COLONOSCOPY WITH PROPOFOL N/A 05/01/2017   Procedure: COLONOSCOPY WITH PROPOFOL;  Surgeon: Midge Minium,  MD;  Location: [facility];  Service: Gastroenterology;  Laterality: N/A;  . DILATION AND CURETTAGE OF UTERUS  1/05  . ESOPHAGOGASTRODUODENOSCOPY (EGD) WITH PROPOFOL N/A 05/01/2017   Procedure: ESOPHAGOGASTRODUODENOSCOPY (EGD) WITH PROPOFOL;  Surgeon: Midge Minium, MD;  Location: [facility];  Service: Gastroenterology;  Laterality: N/A;  . KNEE SURGERY     L - arthroscopic  . LIVER BIOPSY  6/03   fatty liver  . LUMBAR SPINE SURGERY  2015   Dr. Amalia Greenhouse  . NASAL SINUS SURGERY    . PANNICULECTOMY  08/2013   [facility], Dr. Diamond Nickel  . sleep study - apnea  04/2001   improved since gastric bypass    Social History   Socioeconomic History  . Marital status: Married    Spouse name: Not on file  . Number of children: Not on file  . Years of education: Not on file  . Highest education level: Not on file  Occupational History  . Not on file  Tobacco Use  . Smoking status: Never Smoker  . Smokeless tobacco: Never Used  . Tobacco comment: tobacco use - no  Substance and Sexual Activity  . Alcohol use: No  . Drug use: No  . Sexual activity: Not Currently    Birth control/protection: None  Other Topics Concern  . Not on file  Social History Narrative   Partner- [name], no children; works in [place], does not get regular exercise.    Social Determinants of Health   Financial Resource Strain:   . Difficulty of Paying Living Expenses: Not on file  Food Insecurity:   . Worried About Programme researcher, broadcasting/film/video in the Last Year: Not on file  . Ran Out of Food in the Last Year: Not on file  Transportation Needs:   . Lack of Transportation (Medical): Not on file  . Lack of Transportation (Non-Medical): Not on file  Physical Activity:   . Days of Exercise per Week: Not on file  . Minutes of Exercise per Session: Not on file  Stress:   . Feeling of Stress : Not on file  Social Connections:   . Frequency of Communication with Friends and Family: Not on file  . Frequency of Social  Gatherings with Friends and Family: Not on file  . Attends Religious Services: Not on file  . Active Member of Clubs or Organizations: Not on file  . Attends Banker Meetings: Not on file  . Marital Status: Not on file  Intimate Partner Violence:   . Fear of Current or Ex-Partner: Not on file  . Emotionally Abused: Not on file  . Physically Abused: Not on file  . Sexually Abused: Not on file    Family History  Problem Relation Age of Onset  . Lung cancer Father   . Diabetes Mother   . Heart failure Mother   . Diabetes Sister   . Fibromyalgia Sister   . Colon cancer Maternal Aunt   . Skin cancer Paternal Aunt   . Skin cancer Paternal Uncle      Current Outpatient Medications:  .  baclofen (LIORESAL) 10 MG tablet, Take 10 mg by mouth daily., Disp: , Rfl:  .  DULoxetine (CYMBALTA)  60 MG capsule, Take 60 mg by mouth daily., Disp: , Rfl:  .  fentaNYL (DURAGESIC - DOSED MCG/HR) 50 MCG/HR, APPLY 1 PATCH(ES) EVERY 72 HOURS BY TRANSDERMAL ROUTE., Disp: , Rfl: 0 .  gabapentin (NEURONTIN) 400 MG capsule, Take 800 mg by mouth 2 (two) times daily. , Disp: , Rfl:  .  levothyroxine (SYNTHROID) 100 MCG tablet, TAKE 1 TABLET BY MOUTH EVERY DAY, Disp: 90 tablet, Rfl: 1 .  promethazine (PHENERGAN) 12.5 MG tablet, Take 12.5 mg by mouth every 6 (six) hours as needed. , Disp: , Rfl:  .  propranolol (INDERAL) 20 MG tablet, TAKE 1 TABLET BY MOUTH TWICE A DAY, Disp: 60 tablet, Rfl: 1 .  traZODone (DESYREL) 50 MG tablet, TAKE 0.5-1 TABLET BY MOUTH AT BEDTIME AS NEEDED FOR SLEEP, Disp: 90 tablet, Rfl: 2 .  ferumoxytol (FERAHEME) 510 MG/17ML SOLN injection, Inject 17 mLs (510 mg total) into the vein once for 1 dose. Infuse 510 mg over at least 15 min. Day ) and repeat  3-8 days later. Dilute full contents of vial ( 17 ml) per product insert instructions before use, Disp: 34 mL, Rfl: 0 .  naloxone (NARCAN) nasal spray 4 mg/0.1 mL, Place 1 spray into the nose as needed (in case of too much pain  patch in her system)., Disp: , Rfl:   No results found.  No images are attached to the encounter.   CMP Latest Ref Rng & Units 11/15/2019  Glucose 70 - 99 mg/dL 268(T)  BUN 6 - 20 mg/dL 11  Creatinine 4.19 - 6.22 mg/dL 2.97  Sodium 989 - 211 mmol/L 142  Potassium 3.5 - 5.1 mmol/L 4.0  Chloride 98 - 111 mmol/L 105  CO2 22 - 32 mmol/L 28  Calcium 8.9 - 10.3 mg/dL 8.9  Total Protein 6.5 - 8.1 g/dL 6.7  Total Bilirubin 0.3 - 1.2 mg/dL 0.4  Alkaline Phos 38 - 126 U/L 91  AST 15 - 41 U/L 46(H)  ALT 0 - 44 U/L 58(H)   CBC Latest Ref Rng & Units 11/15/2019  WBC 4.0 - 10.5 K/uL 4.8  Hemoglobin 12.0 - 15.0 g/dL 94.1  Hematocrit 74.0 - 46.0 % 38.8  Platelets 150 - 400 K/uL 148(L)     Observation/objective: Appears in no acute distress over video visit today.  Breathing is nonlabored  Assessment and plan: Patient is a 54 year old female who is here for follow-up of following issues:  1.  Elevated alkaline phosphatase: Patient is alkaline phosphatase have presently normalized to 91.  Given the waxing and waning levels I do not see any reason for further evaluation such as a CT scan at this time.  2.  Abnormal LFTs: Patient has mostly had normal LFTs in the past.  This is the first time and they are mildly elevated.  I will repeat his CMP in 2 months time and if they are persistently abnormal refer her back to Dr. Servando Snare  3.  Abnormal bone scan:Patient's prior bone scan in September 2020 showed uptake in the anterior left rib.  Patient has had a fall in April 2020 and thinks she may have possibly injured her chest wall as well at that time.  Chest x-ray or bone scan at that time was not performed.  I will get a repeat bone scan at this time to ensure stability of these findings  4.  Iron and B12 deficiency anemia: Presently her CBC is normal and iron studies are normal.  She does not require IV iron at  this time.  B12 levels are borderline low at 269.  We will ask her to take oral B12 1000 mcg  daily  Follow-up instructions: I will see her back in 4 months with CBC with differential CMP ferritin and iron studies and B12 levels.  Bone scan and CMP in 2 months  I discussed the assessment and treatment plan with the patient. The patient was provided an opportunity to ask questions and all were answered. The patient agreed with the plan and demonstrated an understanding of the instructions.   The patient was advised to call back or seek an in-person evaluation if the symptoms worsen or if the condition fails to improve as anticipated.  Visit Diagnosis: 1. Iron deficiency anemia, unspecified iron deficiency anemia type   2. Abnormal radionuclide bone scan   3. Elevated alkaline phosphatase level   4. Abnormal LFTs   5. B12 deficiency     Dr. Randa Evens, MD, MPH [facility] Tel- [phone] 11/17/2019 8:27 AM

## 2019-11-28 ENCOUNTER — Other Ambulatory Visit: Payer: Self-pay

## 2019-11-28 ENCOUNTER — Ambulatory Visit (INDEPENDENT_AMBULATORY_CARE_PROVIDER_SITE_OTHER): Payer: BC Managed Care – PPO | Admitting: Family

## 2019-11-28 ENCOUNTER — Encounter: Payer: Self-pay | Admitting: Family

## 2019-11-28 VITALS — BP 122/74 | HR 63 | Temp 97.4°F | Ht 64.5 in | Wt 223.0 lb

## 2019-11-28 DIAGNOSIS — M5137 Other intervertebral disc degeneration, lumbosacral region: Secondary | ICD-10-CM | POA: Diagnosis not present

## 2019-11-28 DIAGNOSIS — M5126 Other intervertebral disc displacement, lumbar region: Secondary | ICD-10-CM | POA: Diagnosis not present

## 2019-11-28 DIAGNOSIS — M533 Sacrococcygeal disorders, not elsewhere classified: Secondary | ICD-10-CM | POA: Diagnosis not present

## 2019-11-28 DIAGNOSIS — R202 Paresthesia of skin: Secondary | ICD-10-CM

## 2019-11-28 DIAGNOSIS — G8929 Other chronic pain: Secondary | ICD-10-CM | POA: Diagnosis not present

## 2019-11-28 DIAGNOSIS — F418 Other specified anxiety disorders: Secondary | ICD-10-CM

## 2019-11-28 DIAGNOSIS — E039 Hypothyroidism, unspecified: Secondary | ICD-10-CM | POA: Diagnosis not present

## 2019-11-28 DIAGNOSIS — G4700 Insomnia, unspecified: Secondary | ICD-10-CM

## 2019-11-28 DIAGNOSIS — R519 Headache, unspecified: Secondary | ICD-10-CM | POA: Diagnosis not present

## 2019-11-28 DIAGNOSIS — E538 Deficiency of other specified B group vitamins: Secondary | ICD-10-CM

## 2019-11-28 DIAGNOSIS — R200 Anesthesia of skin: Secondary | ICD-10-CM

## 2019-11-28 LAB — TSH: TSH: 1.94 u[IU]/mL (ref 0.35–4.50)

## 2019-11-28 LAB — HEMOGLOBIN A1C: Hgb A1c MFr Bld: 5.5 % (ref 4.6–6.5)

## 2019-11-28 MED ORDER — TRAZODONE HCL 100 MG PO TABS: 100.0000 mg | ORAL_TABLET | Freq: Every day | ORAL | 1 refills | Status: DC

## 2019-11-28 MED ORDER — CYANOCOBALAMIN 1000 MCG/ML IJ SOLN
1000.0000 ug | Freq: Once | INTRAMUSCULAR | Status: AC
  Administered 2019-11-28: 1000 ug via INTRAMUSCULAR

## 2019-11-28 NOTE — Assessment & Plan Note (Signed)
New, symptoms correlates with decreasing B12.  Patient history of gastric bypass.  Pending intrinsic factor.  Will start on B12 injection as suspect may be contributing to symptoms numbness and tingling as well as fatigue/depression.

## 2019-11-28 NOTE — Assessment & Plan Note (Signed)
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 We jointly agreed to increase trazodone.  Patient has had success with Wellbutrin in the past.  We will certainly consider this as well. Close follow up.

## 2019-11-28 NOTE — Patient Instructions (Addendum)
Please also ensure you are taking synthroid correctly as this can affect thyroid levels-  Take Synthroid once a day, every day at the same time before breakfast. Take Synthroid with only water and on an empty stomach. Wait 30 minutes to 1 hour before eating or drinking anything other than water.Please also separated by >4 hours from acid reflux medications, calcium, iron, multivitamins as may interfere with absorption.   Trial increase trazodone to 100 mg at bedtime.  We will see if this helps with depression, headache.  Please try to take less of Tylenol he can suspect is contributory.  We will keep you on B12 injections.  Close follow-up in 6 weeks.

## 2019-11-28 NOTE — Assessment & Plan Note (Signed)
Reassured by normal neurologic exam.  Discussed potentially increasing propranolol.  Although will defer that for next visit.  Advised her try to increase use of Tylenol.  We will focus on getting more sleep to see if this helps with headache.

## 2019-11-28 NOTE — Progress Notes (Signed)
Subjective:    Patient ID: [name], female    DOB: Jun 21, 1966, 54 y.o.   MRN: [id number]  CC: [name] is a 54 y.o. female who presents today for follow up.   HPI: Complains of worsening depression over past couple of weeks.  Describes family stres with caring for mother since windowed 3 months ago, moving.  Taking propranolol, cymblata, trazodone 50mg .  Closed on house however doesn't feel excited.  More irritable, anxiety, trouble sleeping.  Had been on wellbutrin in the past.   No si/hi.   Cymbalta started by pain management.   Hypothyroidism - notes taking synthroid with gabapentin , trazodone, propranolol.   Complains of BL hand numbness for 2 months. Dr had told her to increase b12 to [name] which she is planning too. She had been on b12 injections in the past.   Complains of headaches which frontal , posterior, worsening over after fall 12/2018.  3-4 headaches per week. No vision changes, aura. Some nausea. H/o TIA. Taking 2000mg  tylenol for HA and left arm pain ( since fall) everyday. HA's are not positonal, doesn't awake her while sleeping. 01/2019 is not worse with BM. Both intensity and frequency has increased. No sinus congestion, fever.   Continues to follow with Dr at [facility].   Hemoglobin 12 , ferritin 95.  b12 269.  CT Head - 12/2018. No acute findings.   HISTORY:  Past Medical History:  Diagnosis Date  . Anemia   . Anxiety and depression   . Chronic back pain   . DM2 (diabetes mellitus, type 2) (HCC)    improved since gastric bypass  . Headache    stress/sinus - 2-3x/wk  . History of left shoulder fracture   . HLD (hyperlipidemia)   . HTN (hypertension)    improved since gastric bypass  . Hypothyroid   . Left leg weakness    s/p back surgery  . Obesity   . S/P insertion of spinal cord stimulator   . Wears contact lenses    Past Surgical History:  Procedure Laterality Date  . Abd [facility] - gallstones  5/03  . BACK  SURGERY  2009   decompressive laminectomy  . BARIATRIC SURGERY  03/02/2012   gastric bypass; Dr 2010  . CHOLECYSTECTOMY  6/03  . COLONOSCOPY WITH PROPOFOL N/A 05/01/2017   Procedure: COLONOSCOPY WITH PROPOFOL;  Surgeon: 8/03, MD;  Location: [facility];  Service: Gastroenterology;  Laterality: N/A;  . DILATION AND CURETTAGE OF UTERUS  1/05  . ESOPHAGOGASTRODUODENOSCOPY (EGD) WITH PROPOFOL N/A 05/01/2017   Procedure: ESOPHAGOGASTRODUODENOSCOPY (EGD) WITH PROPOFOL;  Surgeon: 3/05, MD;  Location: [facility];  Service: Gastroenterology;  Laterality: N/A;  . KNEE SURGERY     L - arthroscopic  . LIVER BIOPSY  6/03   fatty liver  . LUMBAR SPINE SURGERY  2015   Dr. 8/03  . NASAL SINUS SURGERY    . PANNICULECTOMY  08/2013   [facility], Dr. Amalia Greenhouse  . sleep study - apnea  04/2001   improved since gastric bypass   Family History  Problem Relation Age of Onset  . Lung cancer Father   . Diabetes Mother   . Heart failure Mother   . Diabetes Sister   . Fibromyalgia Sister   . Colon cancer Maternal Aunt   . Skin cancer Paternal Aunt   . Skin cancer Paternal Uncle     Allergies: Latex and Penicillins Current Outpatient Medications on File Prior to Visit  Medication Sig Dispense Refill  . baclofen (LIORESAL) 10 MG tablet Take 10 mg by mouth daily.    . DULoxetine (CYMBALTA) 60 MG capsule Take 60 mg by mouth daily.    . fentaNYL (DURAGESIC - DOSED MCG/HR) 50 MCG/HR APPLY 1 PATCH(ES) EVERY 72 HOURS BY TRANSDERMAL ROUTE.  0  . gabapentin (NEURONTIN) 400 MG capsule Take 800 mg by mouth 2 (two) times daily.     Marland Kitchen levothyroxine (SYNTHROID) 100 MCG tablet TAKE 1 TABLET BY MOUTH EVERY DAY 90 tablet 1  . naloxone (NARCAN) nasal spray 4 mg/0.1 mL Place 1 spray into the nose as needed (in case of too much pain patch in her system).    . propranolol (INDERAL) 20 MG tablet TAKE 1 TABLET BY MOUTH TWICE A DAY 60 tablet 1   No current facility-administered medications on file  prior to visit.    Social History   Tobacco Use  . Smoking status: Never Smoker  . Smokeless tobacco: Never Used  . Tobacco comment: tobacco use - no  Substance Use Topics  . Alcohol use: No  . Drug use: No    Review of Systems  Constitutional: Negative for chills and fever.  Respiratory: Negative for cough.   Cardiovascular: Negative for chest pain and palpitations.  Gastrointestinal: Negative for nausea and vomiting.  Neurological: Positive for numbness (BL hands) and headaches. Negative for weakness.      Objective:    BP 122/74   Pulse 63   Temp (!) 97.4 F (36.3 C) (Temporal)   Ht 5' 4.5" (1.638 m)   Wt 223 lb (101.2 kg)   LMP 12/16/2015   SpO2 97%   BMI 37.69 kg/m  BP Readings from Last 3 Encounters:  11/28/19 122/74  09/19/19 139/65  07/25/19 135/69   Wt Readings from Last 3 Encounters:  11/28/19 223 lb (101.2 kg)  09/19/19 218 lb 5 oz (99 kg)  07/25/19 216 lb 5 oz (98.1 kg)    Physical Exam Vitals reviewed.  Constitutional:      Appearance: She is well-developed.  HENT:     Mouth/Throat:     Pharynx: Uvula midline.  Eyes:     Conjunctiva/sclera: Conjunctivae normal.     Pupils: Pupils are equal, round, and reactive to light.     Comments: Fundus normal bilaterally.   Cardiovascular:     Rate and Rhythm: Normal rate and regular rhythm.     Pulses: Normal pulses.     Heart sounds: Normal heart sounds.  Pulmonary:     Effort: Pulmonary effort is normal.     Breath sounds: Normal breath sounds. No wheezing, rhonchi or rales.  Skin:    General: Skin is warm and dry.  Neurological:     Mental Status: She is alert.     Cranial Nerves: No cranial nerve deficit.     Sensory: No sensory deficit.     Deep Tendon Reflexes:     Reflex Scores:      Bicep reflexes are 2+ on the right side and 2+ on the left side.      Patellar reflexes are 2+ on the right side and 2+ on the left side.    Comments: Grip equal and strong bilateral upper extremities.  Gait strong and steady. Able to perform rapid alternating movement without difficulty.   Psychiatric:        Speech: Speech normal.        Behavior: Behavior normal.        Thought Content:  Thought content normal.        Assessment & Plan:   Problem List Items Addressed This Visit      Endocrine   Hypothyroidism - Primary    Pending [email or web address] about how to take Synthroid appropriately      Relevant Orders   TSH     Other   Depression with anxiety    Worsened of late, suspect from stresses of life including being a caregiver, job loss in the past year. We jointly agreed to increase trazodone.  Patient has had success with Wellbutrin in the past.  We will certainly consider this as well. Close follow up.      Relevant Medications   traZODone (DESYREL) 100 MG tablet   Headache    Reassured by normal neurologic exam.  Discussed potentially increasing propranolol.  Although will defer that for next visit.  Advised her try to increase use of Tylenol.  We will focus on getting more sleep to see if this helps with headache.      Relevant Medications   traZODone (DESYREL) 100 MG tablet   Numbness and tingling in both hands    New, symptoms correlates with decreasing B12.  Patient history of gastric bypass.  Pending intrinsic factor.  Will start on B12 injection as suspect may be contributing to symptoms numbness and tingling as well as fatigue/depression.       Relevant Orders   Intrinsic Factor Antibodies   Hemoglobin A1c    Other Visit Diagnoses    Insomnia, unspecified type       Relevant Medications   traZODone (DESYREL) 100 MG tablet       I have discontinued Bailley C. Demonbreun's promethazine and Feraheme. I have also changed her traZODone. Additionally, I am having her maintain her baclofen, gabapentin, fentaNYL, DULoxetine, naloxone, levothyroxine, and propranolol.   Meds ordered this encounter  Medications  . traZODone (DESYREL) 100 MG tablet    Sig: Take 1  tablet (100 mg total) by mouth at bedtime.    Dispense:  90 tablet    Refill:  1    DX CODE F41.8    Order Specific Question:   Supervising Provider    Answer:   Sherlene Shams [[id number]]    Return precautions given.   Risks, benefits, and alternatives of the medications and treatment plan prescribed today were discussed, and patient expressed understanding.   Education regarding symptom management and diagnosis given to patient on AVS.  Continue to follow with Allegra Grana, FNP for routine health maintenance.   Benjamin Stain and I agreed with plan.   Rennie Plowman, FNP

## 2019-11-28 NOTE — Assessment & Plan Note (Signed)
Pending [email or web address] about how to take Synthroid appropriately

## 2019-12-01 DIAGNOSIS — M533 Sacrococcygeal disorders, not elsewhere classified: Secondary | ICD-10-CM | POA: Diagnosis not present

## 2019-12-02 LAB — INTRINSIC FACTOR ANTIBODIES: Intrinsic Factor: POSITIVE — AB

## 2019-12-05 DIAGNOSIS — M25512 Pain in left shoulder: Secondary | ICD-10-CM | POA: Diagnosis not present

## 2019-12-05 DIAGNOSIS — M7502 Adhesive capsulitis of left shoulder: Secondary | ICD-10-CM | POA: Diagnosis not present

## 2019-12-05 DIAGNOSIS — M25412 Effusion, left shoulder: Secondary | ICD-10-CM | POA: Diagnosis not present

## 2019-12-07 ENCOUNTER — Ambulatory Visit (INDEPENDENT_AMBULATORY_CARE_PROVIDER_SITE_OTHER): Payer: BC Managed Care – PPO | Admitting: *Deleted

## 2019-12-07 ENCOUNTER — Other Ambulatory Visit: Payer: Self-pay

## 2019-12-07 DIAGNOSIS — E538 Deficiency of other specified B group vitamins: Secondary | ICD-10-CM | POA: Diagnosis not present

## 2019-12-07 MED ORDER — CYANOCOBALAMIN 1000 MCG/ML IJ SOLN
1000.0000 ug | Freq: Once | INTRAMUSCULAR | Status: AC
  Administered 2019-12-07: 1000 ug via INTRAMUSCULAR

## 2019-12-07 NOTE — Progress Notes (Addendum)
SEE Ov Note 3/15/21Patient presented for B 12 injection to left deltoid, patient voiced no concerns nor showed any signs of distress during injection Agree with plan. Margaret arnett, np

## 2019-12-08 DIAGNOSIS — M7502 Adhesive capsulitis of left shoulder: Secondary | ICD-10-CM | POA: Diagnosis not present

## 2019-12-08 DIAGNOSIS — M25412 Effusion, left shoulder: Secondary | ICD-10-CM | POA: Diagnosis not present

## 2019-12-09 ENCOUNTER — Telehealth: Payer: BC Managed Care – PPO | Admitting: Nurse Practitioner

## 2019-12-09 DIAGNOSIS — R21 Rash and other nonspecific skin eruption: Secondary | ICD-10-CM

## 2019-12-09 MED ORDER — PREDNISONE 10 MG (21) PO TBPK: ORAL_TABLET | ORAL | 0 refills | Status: AC

## 2019-12-09 NOTE — Progress Notes (Signed)
E Visit for Rash  We are sorry that you are not feeling well. Here is how we plan to help!  Prednisone 10 mg daily for 6 days (see taper instructions below)  Directions for 6 day taper: Day 1: 2 tablets before breakfast, 1 after both lunch & dinner and 2 at bedtime Day 2: 1 tab before breakfast, 1 after both lunch & dinner and 2 at bedtime Day 3: 1 tab at each meal & 1 at bedtime Day 4: 1 tab at breakfast, 1 at lunch, 1 at bedtime Day 5: 1 tab at breakfast & 1 tab at bedtime Day 6: 1 tab at breakfast   HOME CARE:   Take cool showers and avoid direct sunlight.  Apply cool compress or wet dressings.  Take a bath in an oatmeal bath.  Sprinkle content of one Aveeno packet under running faucet with comfortably warm water.  Bathe for 15-20 minutes, 1-2 times daily.  Pat dry with a towel. Do not rub the rash.  Use hydrocortisone cream.  Take an antihistamine like Benadryl for widespread rashes that itch.  The adult dose of Benadryl is 25-50 mg by mouth 4 times daily.  Caution:  This type of medication may cause sleepiness.  Do not drink alcohol, drive, or operate dangerous machinery while taking antihistamines.  Do not take these medications if you have prostate enlargement.  Read package instructions thoroughly on all medications that you take.  GET HELP RIGHT AWAY IF:   Symptoms don't go away after treatment.  Severe itching that persists.  If you rash spreads or swells.  If you rash begins to smell.  If it blisters and opens or develops a yellow-brown crust.  You develop a fever.  You have a sore throat.  You become short of breath.  MAKE SURE YOU:  Understand these instructions. Will watch your condition. Will get help right away if you are not doing well or get worse.  Thank you for choosing an e-visit. Your e-visit answers were reviewed by a board certified advanced clinical practitioner to complete your personal care plan. Depending upon the condition, your  plan could have included both over the counter or prescription medications. Please review your pharmacy choice. Be sure that the pharmacy you have chosen is open so that you can pick up your prescription now.  If there is a problem you may message your provider in MyChart to have the prescription routed to another pharmacy. Your safety is important to [place]. If you have drug allergies check your prescription carefully.  For the next 24 hours, you can use MyChart to ask questions about today's visit, request a non-urgent call back, or ask for a work or school excuse from your e-visit provider. You will get an email in the next two days asking about your experience. I hope that your e-visit has been valuable and will speed your recovery.  I have spent at least 5 minutes reviewing and documenting in the patient's chart.

## 2019-12-13 DIAGNOSIS — M25412 Effusion, left shoulder: Secondary | ICD-10-CM | POA: Diagnosis not present

## 2019-12-13 DIAGNOSIS — M7502 Adhesive capsulitis of left shoulder: Secondary | ICD-10-CM | POA: Diagnosis not present

## 2019-12-14 ENCOUNTER — Other Ambulatory Visit: Payer: Self-pay

## 2019-12-14 ENCOUNTER — Ambulatory Visit (INDEPENDENT_AMBULATORY_CARE_PROVIDER_SITE_OTHER): Payer: BC Managed Care – PPO

## 2019-12-14 ENCOUNTER — Encounter: Payer: Self-pay | Admitting: Family

## 2019-12-14 DIAGNOSIS — E538 Deficiency of other specified B group vitamins: Secondary | ICD-10-CM | POA: Diagnosis not present

## 2019-12-14 DIAGNOSIS — D510 Vitamin B12 deficiency anemia due to intrinsic factor deficiency: Secondary | ICD-10-CM | POA: Insufficient documentation

## 2019-12-14 MED ORDER — CYANOCOBALAMIN 1000 MCG/ML IJ SOLN
1000.0000 ug | Freq: Once | INTRAMUSCULAR | Status: AC
  Administered 2019-12-14: 1000 ug via INTRAMUSCULAR

## 2019-12-14 NOTE — Progress Notes (Addendum)
Patient presented for B 12 injection to Right deltoid, patient voiced no concerns nor showed any signs of distress during injection.  Agree with plan. Rennie Plowman, NP

## 2019-12-16 ENCOUNTER — Other Ambulatory Visit: Payer: Self-pay

## 2019-12-16 ENCOUNTER — Ambulatory Visit: Payer: BC Managed Care – PPO | Attending: Internal Medicine

## 2019-12-16 DIAGNOSIS — Z23 Encounter for immunization: Secondary | ICD-10-CM

## 2019-12-16 NOTE — Progress Notes (Signed)
   Covid-19 Vaccination Clinic  Name:  Marcianna Daily    MRN: [id number] DOB: [birth year]  12/16/2019  Ms. Kravitz was observed post Covid-19 immunization for 15 minutes without incident. She was provided with Vaccine Information Sheet and instruction to access the V-Safe system.   Ms. Sosnowski was instructed to call 911 with any severe reactions post vaccine: Marland Kitchen Difficulty breathing  . Swelling of face and throat  . A fast heartbeat  . A bad rash all over body  . Dizziness and weakness   Immunizations Administered    Name Date Dose VIS Date Route   Pfizer COVID-19 Vaccine 12/16/2019 10:54 AM 0.3 mL 08/26/2019 Intramuscular   Manufacturer: [employer], [employer]   Lot: [id number]   NDC: [address]-8

## 2019-12-21 ENCOUNTER — Ambulatory Visit (INDEPENDENT_AMBULATORY_CARE_PROVIDER_SITE_OTHER): Payer: BC Managed Care – PPO | Admitting: *Deleted

## 2019-12-21 ENCOUNTER — Other Ambulatory Visit: Payer: Self-pay

## 2019-12-21 DIAGNOSIS — E538 Deficiency of other specified B group vitamins: Secondary | ICD-10-CM | POA: Diagnosis not present

## 2019-12-21 MED ORDER — CYANOCOBALAMIN 1000 MCG/ML IJ SOLN
1000.0000 ug | Freq: Once | INTRAMUSCULAR | Status: AC
  Administered 2019-12-21: 1000 ug via INTRAMUSCULAR

## 2019-12-21 NOTE — Progress Notes (Addendum)
Patient presented for B 12 injection to right deltoid, patient voiced no concerns nor showed any signs of distress during injection. Agree with plan. Margaret Arnett, NP  

## 2020-01-03 DIAGNOSIS — M25412 Effusion, left shoulder: Secondary | ICD-10-CM | POA: Diagnosis not present

## 2020-01-03 DIAGNOSIS — M7502 Adhesive capsulitis of left shoulder: Secondary | ICD-10-CM | POA: Diagnosis not present

## 2020-01-05 DIAGNOSIS — M7502 Adhesive capsulitis of left shoulder: Secondary | ICD-10-CM | POA: Diagnosis not present

## 2020-01-05 DIAGNOSIS — M25412 Effusion, left shoulder: Secondary | ICD-10-CM | POA: Diagnosis not present

## 2020-01-09 ENCOUNTER — Other Ambulatory Visit: Payer: Self-pay

## 2020-01-09 ENCOUNTER — Encounter: Payer: Self-pay | Admitting: Family

## 2020-01-09 ENCOUNTER — Ambulatory Visit (INDEPENDENT_AMBULATORY_CARE_PROVIDER_SITE_OTHER): Payer: BC Managed Care – PPO | Admitting: Family

## 2020-01-09 VITALS — BP 116/72 | HR 70 | Temp 96.7°F | Ht 64.5 in | Wt 224.0 lb

## 2020-01-09 DIAGNOSIS — R519 Headache, unspecified: Secondary | ICD-10-CM | POA: Diagnosis not present

## 2020-01-09 DIAGNOSIS — F418 Other specified anxiety disorders: Secondary | ICD-10-CM | POA: Diagnosis not present

## 2020-01-09 DIAGNOSIS — R5382 Chronic fatigue, unspecified: Secondary | ICD-10-CM | POA: Diagnosis not present

## 2020-01-09 DIAGNOSIS — R110 Nausea: Secondary | ICD-10-CM | POA: Diagnosis not present

## 2020-01-09 DIAGNOSIS — D510 Vitamin B12 deficiency anemia due to intrinsic factor deficiency: Secondary | ICD-10-CM

## 2020-01-09 DIAGNOSIS — R5383 Other fatigue: Secondary | ICD-10-CM | POA: Insufficient documentation

## 2020-01-09 LAB — B12 AND FOLATE PANEL
Folate: 6.1 ng/mL (ref >5.9)
Vitamin B-12: 371 pg/mL (ref 211–911)

## 2020-01-09 MED ORDER — ONDANSETRON 4 MG PO TBDP: 4.0000 mg | ORAL_TABLET | Freq: Three times a day (TID) | ORAL | 1 refills | Status: DC | PRN

## 2020-01-09 MED ORDER — MAGNESIUM CITRATE 200 MG PO TABS: 400.0000 mg | ORAL_TABLET | Freq: Every day | ORAL | 1 refills | Status: DC

## 2020-01-09 MED ORDER — FAMOTIDINE 20 MG PO TABS: 20.0000 mg | ORAL_TABLET | Freq: Two times a day (BID) | ORAL | 1 refills | Status: DC

## 2020-01-09 NOTE — Patient Instructions (Signed)
Stop propranolol START mag citrate, pepcid, and zofran as needed Referral to neurology Let us know if you dont hear back within a week in regards to an appointment being scheduled.  Let me know how you are doing

## 2020-01-09 NOTE — Assessment & Plan Note (Signed)
No improvement of propranolol regards to headache frequency.  Advised magnesium citrate and to stop propranolol especially in the setting of fatigue.  Patient and I jointly agreed that we will consult neurology for further evaluation, management.  She will remain vigilant let me know of any new concerns.  Close follow-up

## 2020-01-09 NOTE — Assessment & Plan Note (Signed)
Depression appears somewhat improved.  No  trouble sleeping. Continue trazodone. Close follow up.

## 2020-01-09 NOTE — Progress Notes (Signed)
Subjective:    Patient ID: [name], female    DOB: 1966-03-02, 54 y.o.   MRN: [id number]  CC: [name] is a 54 y.o. female who presents today for follow up.   HPI: Complains of nausea for couple of weeks, daily If eats anything 'solid' will get nauseated. No particular solid foods. Drinking more protein drinks.  No epigastric burning. No NSAIDs.  Weight is stable. No vomiting, diarrhea, abdominal pain, trouble swallowing.  H/o gastric bypass.  Has tried promethazine in the past with some relief from pain management from Dr Dustin Folks.   depression- sleeping well on trazodone. 'sleeping better'. To bed at 930pm. Gets up at 8am.Feels well in the morning and then if does too much such as house chores including vacuuming, sweeping the floor; she will then feel fatigued after an hour of chores. Takes a 2 hour nap.  Denies exertional chest pain or pressure, numbness or tingling radiating to left arm or jaw, palpitations, dizziness, frequent headaches, changes in vision, or shortness of breath.   Has PT for shoulder 3 days per week.   Pernicious anemia- compliant with b12 injections.   HA- compliant with propranolol and unsure if helpful. has HA 4 days out of 7 days. Dull HA. No vision changes. Taking less tylenol.    HISTORY:  Past Medical History:  Diagnosis Date  . Anemia   . Anxiety and depression   . Chronic back pain   . DM2 (diabetes mellitus, type 2) (HCC)    improved since gastric bypass  . Headache    stress/sinus - 2-3x/wk  . History of left shoulder fracture   . HLD (hyperlipidemia)   . HTN (hypertension)    improved since gastric bypass  . Hypothyroid   . Left leg weakness    s/p back surgery  . Obesity   . S/P insertion of spinal cord stimulator   . Wears contact lenses    Past Surgical History:  Procedure Laterality Date  . Abd [place] - gallstones  5/03  . BACK SURGERY  2009   decompressive laminectomy  . BARIATRIC SURGERY  03/02/2012   gastric bypass; Dr Smitty Cords  . CHOLECYSTECTOMY  6/03  . COLONOSCOPY WITH PROPOFOL N/A 05/01/2017   Procedure: COLONOSCOPY WITH PROPOFOL;  Surgeon: Midge Minium, MD;  Location: [facility];  Service: Gastroenterology;  Laterality: N/A;  . DILATION AND CURETTAGE OF UTERUS  1/05  . ESOPHAGOGASTRODUODENOSCOPY (EGD) WITH PROPOFOL N/A 05/01/2017   Procedure: ESOPHAGOGASTRODUODENOSCOPY (EGD) WITH PROPOFOL;  Surgeon: Midge Minium, MD;  Location: [facility];  Service: Gastroenterology;  Laterality: N/A;  . KNEE SURGERY     L - arthroscopic  . LIVER BIOPSY  6/03   fatty liver  . LUMBAR SPINE SURGERY  2015   Dr. Amalia Greenhouse  . NASAL SINUS SURGERY    . PANNICULECTOMY  08/2013   [facility], Dr. Diamond Nickel  . sleep study - apnea  04/2001   improved since gastric bypass   Family History  Problem Relation Age of Onset  . Lung cancer Father   . Diabetes Mother   . Heart failure Mother   . Diabetes Sister   . Fibromyalgia Sister   . Colon cancer Maternal Aunt   . Skin cancer Paternal Aunt   . Skin cancer Paternal Uncle     Allergies: Latex and Penicillins Current Outpatient Medications on File Prior to Visit  Medication Sig Dispense Refill  . baclofen (LIORESAL) 10 MG tablet Take 10 mg by mouth daily.    Marland Kitchen  DULoxetine (CYMBALTA) 60 MG capsule Take 60 mg by mouth daily.    . fentaNYL (DURAGESIC - DOSED MCG/HR) 50 MCG/HR APPLY 1 PATCH(ES) EVERY 72 HOURS BY TRANSDERMAL ROUTE.  0  . gabapentin (NEURONTIN) 400 MG capsule Take 800 mg by mouth 2 (two) times daily.     Marland Kitchen levothyroxine (SYNTHROID) 100 MCG tablet TAKE 1 TABLET BY MOUTH EVERY DAY 90 tablet 1  . naloxone (NARCAN) nasal spray 4 mg/0.1 mL Place 1 spray into the nose as needed (in case of too much pain patch in her system).    . traZODone (DESYREL) 100 MG tablet Take 1 tablet (100 mg total) by mouth at bedtime. 90 tablet 1   No current facility-administered medications on file prior to visit.    Social History   Tobacco Use  . Smoking  status: Never Smoker  . Smokeless tobacco: Never Used  . Tobacco comment: tobacco use - no  Substance Use Topics  . Alcohol use: No  . Drug use: No    Review of Systems  Constitutional: Positive for fatigue. Negative for chills and fever.  Eyes: Negative for visual disturbance.  Respiratory: Negative for cough and shortness of breath.   Cardiovascular: Negative for chest pain and palpitations.  Gastrointestinal: Positive for nausea. Negative for abdominal distention, abdominal pain, constipation, diarrhea and vomiting.  Neurological: Positive for headaches.  Psychiatric/Behavioral: Negative for sleep disturbance and suicidal ideas. The patient is not nervous/anxious.       Objective:    BP 116/72   Pulse 70   Temp (!) 96.7 F (35.9 C) (Temporal)   Ht 5' 4.5" (1.638 m)   Wt 224 lb (101.6 kg)   LMP 12/16/2015   SpO2 96%   BMI 37.86 kg/m  BP Readings from Last 3 Encounters:  01/09/20 116/72  11/28/19 122/74  09/19/19 139/65   Wt Readings from Last 3 Encounters:  01/09/20 224 lb (101.6 kg)  11/28/19 223 lb (101.2 kg)  09/19/19 218 lb 5 oz (99 kg)    Physical Exam Vitals reviewed.  Constitutional:      Appearance: Normal appearance. She is well-developed.  Eyes:     Conjunctiva/sclera: Conjunctivae normal.  Cardiovascular:     Rate and Rhythm: Normal rate and regular rhythm.     Pulses: Normal pulses.     Heart sounds: Normal heart sounds.  Pulmonary:     Effort: Pulmonary effort is normal.     Breath sounds: Normal breath sounds. No wheezing, rhonchi or rales.  Abdominal:     General: Bowel sounds are normal. There is no distension.     Palpations: Abdomen is soft. Abdomen is not rigid. There is no fluid wave or mass.     Tenderness: There is no abdominal tenderness. There is no guarding or rebound.     Comments: obese  Skin:    General: Skin is warm and dry.  Neurological:     Mental Status: She is alert.  Psychiatric:        Speech: Speech normal.         Behavior: Behavior normal.        Thought Content: Thought content normal.        Assessment & Plan:   Problem List Items Addressed This Visit      Other   Depression with anxiety    Depression appears somewhat improved.  No  trouble sleeping. Continue trazodone. Close follow up.       Fatigue    Etiology nonspecific at this  time.  Discussed with patient suspect multifactorial including pernicious anemia, lack of exercise, obesity (weight gain).  Discussed with patient to start a walking program daily and let me know if symptom doesn't improve.       Relevant Medications   famotidine (PEPCID) 20 MG tablet   ondansetron (ZOFRAN ODT) 4 MG disintegrating tablet   Other Relevant Orders   Celiac Disease Ab Screen w/Rfx   B12 and Folate Panel   Headache - Primary    No improvement of propranolol regards to headache frequency.  Advised magnesium citrate and to stop propranolol especially in the setting of fatigue.  Patient and I jointly agreed that we will consult neurology for further evaluation, management.  She will remain vigilant let me know of any new concerns.  Close follow-up      Relevant Medications   Magnesium Citrate 200 MG TABS   Other Relevant Orders   Ambulatory referral to Neurology   Nausea    Patient well-appearing today, she is nontoxic in appearance.  Benign abdominal exam.  Etiology for nausea nonspecific at this time.  Discussed her differential including GERD (atypical presentation), medication side effect (?  Fentanyl).  Trial of Pepcid and Zofran.  Patient will maintain close vigilance and let me know if this symptom is not resolved          I have discontinued Neeva C. Graves's propranolol. I am also having her start on famotidine, ondansetron, and Magnesium Citrate. Additionally, I am having her maintain her baclofen, gabapentin, fentaNYL, DULoxetine, naloxone, levothyroxine, and traZODone.   Meds ordered this encounter  Medications  . famotidine  (PEPCID) 20 MG tablet    Sig: Take 1 tablet (20 mg total) by mouth 2 (two) times daily.    Dispense:  60 tablet    Refill:  1    Order Specific Question:   Supervising Provider    Answer:   Deborra Medina L [[id number]]  . ondansetron (ZOFRAN ODT) 4 MG disintegrating tablet    Sig: Take 1 tablet (4 mg total) by mouth every 8 (eight) hours as needed for nausea or vomiting.    Dispense:  30 tablet    Refill:  1    Order Specific Question:   Supervising Provider    Answer:   Deborra Medina L [[id number]]  . Magnesium Citrate 200 MG TABS    Sig: Take 400 mg by mouth daily.    Dispense:  120 tablet    Refill:  1    Order Specific Question:   Supervising Provider    Answer:   Crecencio Mc [[id number]]    Return precautions given.   Risks, benefits, and alternatives of the medications and treatment plan prescribed today were discussed, and patient expressed understanding.   Education regarding symptom management and diagnosis given to patient on AVS.  Continue to follow with Burnard Hawthorne, FNP for routine health maintenance.   Skeet Latch and I agreed with plan.   Mable Paris, FNP

## 2020-01-09 NOTE — Progress Notes (Signed)
Patient stated that she was tested years ago before her weight loss & does not care to go through that again. She said that she would think about it, but really does not want to go that route.

## 2020-01-09 NOTE — Assessment & Plan Note (Signed)
Etiology nonspecific at this time.  Discussed with patient suspect multifactorial including pernicious anemia, lack of exercise, obesity (weight gain).  Discussed with patient to start a walking program daily and let me know if symptom doesn't improve.

## 2020-01-09 NOTE — Assessment & Plan Note (Signed)
Patient well-appearing today, she is nontoxic in appearance.  Benign abdominal exam.  Etiology for nausea nonspecific at this time.  Discussed her differential including GERD (atypical presentation), medication side effect (?  Fentanyl).  Trial of Pepcid and Zofran.  Patient will maintain close vigilance and let me know if this symptom is not resolved

## 2020-01-10 ENCOUNTER — Ambulatory Visit: Payer: BC Managed Care – PPO | Attending: Internal Medicine

## 2020-01-10 DIAGNOSIS — Z23 Encounter for immunization: Secondary | ICD-10-CM

## 2020-01-10 NOTE — Progress Notes (Signed)
   Covid-19 Vaccination Clinic  Name:  [name]    MRN: [id number] DOB: Feb 09, 1966  01/10/2020  [name] was observed post Covid-19 immunization for 15 minutes without incident. She was provided with Vaccine Information Sheet and instruction to access the V-Safe system.   [name] was instructed to call 911 with any severe reactions post vaccine: Marland Kitchen Difficulty breathing  . Swelling of face and throat  . A fast heartbeat  . A bad rash all over body  . Dizziness and weakness   Immunizations Administered    Name Date Dose VIS Date Route   Pfizer COVID-19 Vaccine 01/10/2020 12:17 PM 0.3 mL 11/09/2018 Intramuscular   Manufacturer: [employer], [employer]   Lot: [id number]   NDC: [address]-0

## 2020-01-11 LAB — CELIAC DISEASE AB SCREEN W/RFX
Antigliadin Abs, IgA: 3 units (ref 0–19)
IgA/Immunoglobulin A, Serum: 167 mg/dL (ref 87–352)
Transglutaminase IgA: <2 U/mL (ref 0–3)

## 2020-01-13 ENCOUNTER — Encounter: Payer: Self-pay | Admitting: Neurology

## 2020-01-16 ENCOUNTER — Other Ambulatory Visit: Payer: Self-pay

## 2020-01-16 ENCOUNTER — Inpatient Hospital Stay: Payer: BC Managed Care – PPO | Attending: Oncology

## 2020-01-16 DIAGNOSIS — D508 Other iron deficiency anemias: Secondary | ICD-10-CM | POA: Insufficient documentation

## 2020-01-16 DIAGNOSIS — D509 Iron deficiency anemia, unspecified: Secondary | ICD-10-CM

## 2020-01-16 DIAGNOSIS — E538 Deficiency of other specified B group vitamins: Secondary | ICD-10-CM | POA: Diagnosis not present

## 2020-01-16 DIAGNOSIS — Z9884 Bariatric surgery status: Secondary | ICD-10-CM | POA: Diagnosis not present

## 2020-01-16 DIAGNOSIS — R748 Abnormal levels of other serum enzymes: Secondary | ICD-10-CM | POA: Diagnosis not present

## 2020-01-16 LAB — CBC WITH DIFFERENTIAL/PLATELET
Abs Immature Granulocytes: 0.03 10*3/uL (ref 0.00–0.07)
Basophils Absolute: 0 10*3/uL (ref 0.0–0.1)
Basophils Relative: 0 %
Eosinophils Absolute: 0.1 10*3/uL (ref 0.0–0.5)
Eosinophils Relative: 1 %
HCT: 40.6 % (ref 36.0–46.0)
Hemoglobin: 13.1 g/dL (ref 12.0–15.0)
Immature Granulocytes: 0 %
Lymphocytes Relative: 19 %
Lymphs Abs: 1.6 10*3/uL (ref 0.7–4.0)
MCH: 30.8 pg (ref 26.0–34.0)
MCHC: 32.3 g/dL (ref 30.0–36.0)
MCV: 95.5 fL (ref 80.0–100.0)
Monocytes Absolute: 0.5 10*3/uL (ref 0.1–1.0)
Monocytes Relative: 6 %
Neutro Abs: 6 10*3/uL (ref 1.7–7.7)
Neutrophils Relative %: 74 %
Platelets: 200 10*3/uL (ref 150–400)
RBC: 4.25 MIL/uL (ref 3.87–5.11)
RDW: 13.6 % (ref 11.5–15.5)
WBC: 8.2 10*3/uL (ref 4.0–10.5)
nRBC: 0 % (ref 0.0–0.2)

## 2020-01-16 LAB — IRON AND TIBC
Iron: 117 ug/dL (ref 28–170)
Saturation Ratios: 31 % (ref 10.4–31.8)
TIBC: 379 ug/dL (ref 250–450)
UIBC: 262 ug/dL

## 2020-01-16 LAB — COMPREHENSIVE METABOLIC PANEL
ALT: 71 U/L — ABNORMAL HIGH (ref 0–44)
AST: 49 U/L — ABNORMAL HIGH (ref 15–41)
Albumin: 3.5 g/dL (ref 3.5–5.0)
Alkaline Phosphatase: 94 U/L (ref 38–126)
Anion gap: 8 (ref 5–15)
BUN: 21 mg/dL — ABNORMAL HIGH (ref 6–20)
CO2: 28 mmol/L (ref 22–32)
Calcium: 8.7 mg/dL — ABNORMAL LOW (ref 8.9–10.3)
Chloride: 104 mmol/L (ref 98–111)
Creatinine, Ser: 0.89 mg/dL (ref 0.44–1.00)
GFR calc Af Amer: >60 mL/min (ref >60)
GFR calc non Af Amer: >60 mL/min (ref >60)
Glucose, Bld: 94 mg/dL (ref 70–99)
Potassium: 3.9 mmol/L (ref 3.5–5.1)
Sodium: 140 mmol/L (ref 135–145)
Total Bilirubin: 0.6 mg/dL (ref 0.3–1.2)
Total Protein: 6.9 g/dL (ref 6.5–8.1)

## 2020-01-16 LAB — FERRITIN: Ferritin: 70 ng/mL (ref 11–307)

## 2020-01-21 ENCOUNTER — Other Ambulatory Visit: Payer: Self-pay | Admitting: Family

## 2020-01-21 DIAGNOSIS — F418 Other specified anxiety disorders: Secondary | ICD-10-CM

## 2020-01-24 ENCOUNTER — Other Ambulatory Visit: Payer: Self-pay

## 2020-01-24 ENCOUNTER — Ambulatory Visit (INDEPENDENT_AMBULATORY_CARE_PROVIDER_SITE_OTHER): Payer: BC Managed Care – PPO

## 2020-01-24 DIAGNOSIS — E538 Deficiency of other specified B group vitamins: Secondary | ICD-10-CM

## 2020-01-24 MED ORDER — CYANOCOBALAMIN 1000 MCG/ML IJ SOLN
1000.0000 ug | Freq: Once | INTRAMUSCULAR | Status: AC
  Administered 2020-01-24: 1000 ug via INTRAMUSCULAR

## 2020-01-24 NOTE — Progress Notes (Signed)
Patient presented for B 12 injection to right deltoid, patient voiced no concerns nor showed any signs of distress during injection. 

## 2020-01-30 DIAGNOSIS — M7502 Adhesive capsulitis of left shoulder: Secondary | ICD-10-CM | POA: Diagnosis not present

## 2020-02-01 DIAGNOSIS — G8929 Other chronic pain: Secondary | ICD-10-CM | POA: Diagnosis not present

## 2020-02-01 DIAGNOSIS — M5126 Other intervertebral disc displacement, lumbar region: Secondary | ICD-10-CM | POA: Diagnosis not present

## 2020-02-01 DIAGNOSIS — M5137 Other intervertebral disc degeneration, lumbosacral region: Secondary | ICD-10-CM | POA: Diagnosis not present

## 2020-02-20 ENCOUNTER — Encounter: Payer: Self-pay | Admitting: Family

## 2020-02-20 ENCOUNTER — Ambulatory Visit (INDEPENDENT_AMBULATORY_CARE_PROVIDER_SITE_OTHER): Payer: BC Managed Care – PPO | Admitting: Family

## 2020-02-20 ENCOUNTER — Ambulatory Visit (INDEPENDENT_AMBULATORY_CARE_PROVIDER_SITE_OTHER): Payer: BC Managed Care – PPO

## 2020-02-20 ENCOUNTER — Other Ambulatory Visit: Payer: Self-pay | Admitting: Family

## 2020-02-20 ENCOUNTER — Other Ambulatory Visit: Payer: Self-pay

## 2020-02-20 VITALS — BP 132/86 | HR 80 | Temp 95.0°F | Ht 64.49 in | Wt 218.0 lb

## 2020-02-20 DIAGNOSIS — R110 Nausea: Secondary | ICD-10-CM

## 2020-02-20 DIAGNOSIS — R5383 Other fatigue: Secondary | ICD-10-CM | POA: Diagnosis not present

## 2020-02-20 DIAGNOSIS — R5382 Chronic fatigue, unspecified: Secondary | ICD-10-CM

## 2020-02-20 DIAGNOSIS — E538 Deficiency of other specified B group vitamins: Secondary | ICD-10-CM

## 2020-02-20 DIAGNOSIS — N939 Abnormal uterine and vaginal bleeding, unspecified: Secondary | ICD-10-CM | POA: Diagnosis not present

## 2020-02-20 LAB — B12 AND FOLATE PANEL
Folate: 4.8 ng/mL — ABNORMAL LOW (ref >5.9)
Vitamin B-12: 303 pg/mL (ref 211–911)

## 2020-02-20 MED ORDER — FOLIC ACID 1 MG PO TABS: 1.0000 mg | ORAL_TABLET | Freq: Every day | ORAL | 3 refills | Status: DC

## 2020-02-20 MED ORDER — OMEPRAZOLE 20 MG PO CPDR: 20.0000 mg | DELAYED_RELEASE_CAPSULE | Freq: Every day | ORAL | 3 refills | Status: DC

## 2020-02-20 NOTE — Assessment & Plan Note (Addendum)
Etiology unclear, working diagnosis GERD. 6 lb weight loss. Start prilosec in addition pepcic ac.  Close follow-up

## 2020-02-20 NOTE — Assessment & Plan Note (Addendum)
Etiology remains unclear at this time.  Considering inadequate exercise, polypharmacy. discussed with patient being tested for sleep apnea as well as consideration for medications written by Dr. Earl Lagos, pain management.  We discussed her medication regimen is quite sedating although not sure she can decrease the medication as her pain seems well controlled at this time. Pending labs, CXR. She has upcoming appointment with Dr [name] and in particular advised patient to pursue PET scan as had been advised. She also understands the importance of having her mammogram however patient would like to wait until left arm pain ( fall in 12/2018) has improved.

## 2020-02-20 NOTE — Progress Notes (Signed)
Subjective:    Patient ID: [name], female    DOB: 10/22/1965, 54 y.o.   MRN: [id number]  CC: [name] is a 54 y.o. female who presents today for follow up.   HPI: Complains of nausea, comes and goes for the past month.  May happen first thing in the morning or after a meal. Compliant with pepcid Has lost 6 lbs. No particular food or trigger that she has noticed. No regurgitation, abdominal pain. No vomiting, fever, early satiety, left arm pain or jaw pain.  History of cholecystectomy, reactive to start. No nsaids, alcohol.  She also complains of night sweats over her 'torso and up',  2-3 nights per week, started one month ago. Has to change pajamas and sheets.  2-3 months ago, had vaginal bleeding. Bleed for 4 - 5 days like a normal menses. Prior to that it has been years since menses. No bleeding now or abnormal discharge. No abdomen distention.   Continues to have fatigue, describes as worse.   Walking more with dog in the yard. Also doing yardwork.  No cough, sob, leg swelling, cp, orthopnea.  Feels well when working in the yard however describes 'hitting a road block' with feeling fatigued, and will lay down to rest. No CP, sob.   Chronic back pain- well controlled. following Dr Earl Lagos. no longer on baclofen, now on skelaxin TID. On gabapentin, percocet, fentanyl  Pernicious anemia- getting monthly b12 injections however noticed improvement of fatigue.   Dr [name]- 11/2019- advised repeat bone scan however patient didn't have due to cost. Upcoming appt 03/20/20.   Normal TSH and hemoglobin, hematocrit    Due for mammogram; deferring until left arm pain improves. Colonoscopy UTD   HISTORY:  Past Medical History:  Diagnosis Date  . Anemia   . Anxiety and depression   . Chronic back pain   . DM2 (diabetes mellitus, type 2) (HCC)    improved since gastric bypass  . Headache    stress/sinus - 2-3x/wk  . History of left shoulder fracture   . HLD  (hyperlipidemia)   . HTN (hypertension)    improved since gastric bypass  . Hypothyroid   . Left leg weakness    s/p back surgery  . Obesity   . S/P insertion of spinal cord stimulator   . Wears contact lenses    Past Surgical History:  Procedure Laterality Date  . Abd [place] - gallstones  5/03  . BACK SURGERY  2009   decompressive laminectomy  . BARIATRIC SURGERY  03/02/2012   gastric bypass; Dr Smitty Cords  . CHOLECYSTECTOMY  6/03  . COLONOSCOPY WITH PROPOFOL N/A 05/01/2017   Procedure: COLONOSCOPY WITH PROPOFOL;  Surgeon: Midge Minium, MD;  Location: [facility];  Service: Gastroenterology;  Laterality: N/A;  . DILATION AND CURETTAGE OF UTERUS  1/05  . ESOPHAGOGASTRODUODENOSCOPY (EGD) WITH PROPOFOL N/A 05/01/2017   Procedure: ESOPHAGOGASTRODUODENOSCOPY (EGD) WITH PROPOFOL;  Surgeon: Midge Minium, MD;  Location: [facility];  Service: Gastroenterology;  Laterality: N/A;  . KNEE SURGERY     L - arthroscopic  . LIVER BIOPSY  6/03   fatty liver  . LUMBAR SPINE SURGERY  2015   Dr. Amalia Greenhouse  . NASAL SINUS SURGERY    . PANNICULECTOMY  08/2013   [facility], Dr. Diamond Nickel  . sleep study - apnea  04/2001   improved since gastric bypass   Family History  Problem Relation Age of Onset  . Lung cancer Father   . Diabetes Mother   .  Heart failure Mother   . Diabetes Sister   . Fibromyalgia Sister   . Colon cancer Maternal Aunt   . Skin cancer Paternal Aunt   . Skin cancer Paternal Uncle     Allergies: Latex and Penicillins Current Outpatient Medications on File Prior to Visit  Medication Sig Dispense Refill  . DULoxetine (CYMBALTA) 60 MG capsule Take 60 mg by mouth daily.    . famotidine (PEPCID) 20 MG tablet Take 1 tablet (20 mg total) by mouth 2 (two) times daily. 60 tablet 1  . fentaNYL (DURAGESIC - DOSED MCG/HR) 50 MCG/HR APPLY 1 PATCH(ES) EVERY 72 HOURS BY TRANSDERMAL ROUTE.  0  . gabapentin (NEURONTIN) 800 MG tablet Take 800 mg by mouth 3 (three) times daily.    Marland Kitchen  levothyroxine (SYNTHROID) 100 MCG tablet TAKE 1 TABLET BY MOUTH EVERY DAY 90 tablet 1  . Magnesium Citrate 200 MG TABS Take 400 mg by mouth daily. 120 tablet 1  . metaxalone (SKELAXIN) 800 MG tablet Take 800 mg by mouth 3 (three) times daily.    . naloxone (NARCAN) nasal spray 4 mg/0.1 mL Place 1 spray into the nose as needed (in case of too much pain patch in her system).    . ondansetron (ZOFRAN ODT) 4 MG disintegrating tablet Take 1 tablet (4 mg total) by mouth every 8 (eight) hours as needed for nausea or vomiting. 30 tablet 1  . oxyCODONE-acetaminophen (PERCOCET/ROXICET) 5-325 MG tablet Take 1 tablet by mouth 2 (two) times daily as needed.    Marland Kitchen PREVIDENT 5000 DRY MOUTH 1.1 % GEL dental gel 3 (three) times daily.    . promethazine (PHENERGAN) 12.5 MG tablet Take 12.5 mg by mouth 4 (four) times daily as needed.    . SODIUM FLUORIDE 5000 PPM 1.1 % PSTE 3 (three) times daily.    . traZODone (DESYREL) 100 MG tablet Take 1 tablet (100 mg total) by mouth at bedtime. 90 tablet 1   No current facility-administered medications on file prior to visit.    Social History   Tobacco Use  . Smoking status: Never Smoker  . Smokeless tobacco: Never Used  . Tobacco comment: tobacco use - no  Substance Use Topics  . Alcohol use: No  . Drug use: No    Review of Systems  Constitutional: Positive for diaphoresis and fatigue. Negative for chills and fever.  Respiratory: Negative for cough.   Cardiovascular: Negative for chest pain and palpitations.  Gastrointestinal: Positive for nausea. Negative for abdominal distention, abdominal pain, constipation and vomiting.  Genitourinary: Positive for vaginal bleeding (resolved).      Objective:    BP 132/86 (BP Location: Left Arm, Patient Position: Sitting)   Pulse 80   Temp (!) 95 F (35 C)   Ht 5' 4.49" (1.638 m)   Wt 218 lb (98.9 kg)   LMP 12/16/2015   SpO2 97%   BMI 36.86 kg/m  BP Readings from Last 3 Encounters:  02/20/20 132/86  01/09/20  116/72  11/28/19 122/74   Wt Readings from Last 3 Encounters:  02/20/20 218 lb (98.9 kg)  01/09/20 224 lb (101.6 kg)  11/28/19 223 lb (101.2 kg)    Physical Exam Vitals reviewed.  Constitutional:      Appearance: She is well-developed.  Eyes:     Conjunctiva/sclera: Conjunctivae normal.  Cardiovascular:     Rate and Rhythm: Normal rate and regular rhythm.     Pulses: Normal pulses.     Heart sounds: Normal heart sounds.  Pulmonary:  Effort: Pulmonary effort is normal.     Breath sounds: Normal breath sounds. No wheezing, rhonchi or rales.  Skin:    General: Skin is warm and dry.  Neurological:     Mental Status: She is alert.  Psychiatric:        Speech: Speech normal.        Behavior: Behavior normal.        Thought Content: Thought content normal.        Assessment & Plan:   Problem List Items Addressed This Visit      Other   Fatigue - Primary      Etiology remains unclear at this time.  Considering inadequate exercise, polypharmacy. discussed with patient being tested for sleep apnea as well as consideration for medications written by Dr. Earl Lagos, pain management.  We discussed her medication regimen is quite sedating although not sure she can decrease the medication as her pain seems well controlled at this time. Pending labs, CXR. She has upcoming appointment with Dr [name] and in particular advised patient to pursue PET scan as had been advised. She also understands the importance of having her mammogram however patient would like to wait until left arm pain ( fall in 12/2018) has improved.       Relevant Orders   Ambulatory referral to Pulmonology   DG Chest 2 View   B12 and Folate Panel   Nausea    Etiology unclear, working diagnosis GERD. Start prilosec in addition pepcic ac.  Close follow-up      Relevant Medications   omeprazole (PRILOSEC) 20 MG capsule   Vaginal bleeding    Resolved.  Episode of what appears to be normal menses,  Prior to had been  years since last menses. Patient does appear to be having hot flashes as well as this time.  Referral to GYN for further evaluation.       Relevant Orders   Ambulatory referral to Obstetrics / Gynecology       I have discontinued Taronda C. Cadenhead's baclofen. I am also having her start on omeprazole. Additionally, I am having her maintain her fentaNYL, DULoxetine, naloxone, levothyroxine, traZODone, famotidine, ondansetron, Magnesium Citrate, metaxalone, oxyCODONE-acetaminophen, promethazine, PreviDent 5000 Dry Mouth, Sodium Fluoride 5000 PPM, and gabapentin.   Meds ordered this encounter  Medications  . omeprazole (PRILOSEC) 20 MG capsule    Sig: Take 1 capsule (20 mg total) by mouth daily.    Dispense:  30 capsule    Refill:  3    Order Specific Question:   Supervising Provider    Answer:   Sherlene Shams [[id number]]    Return precautions given.   Risks, benefits, and alternatives of the medications and treatment plan prescribed today were discussed, and patient expressed understanding.   Education regarding symptom management and diagnosis given to patient on AVS.  Continue to follow with Allegra Grana, FNP for routine health maintenance.   Benjamin Stain and I agreed with plan.   Rennie Plowman, FNP

## 2020-02-20 NOTE — Assessment & Plan Note (Addendum)
Resolved.  Episode of what appears to be normal menses,  Prior to had been years since last menses. Patient does appear to be having hot flashes as well as this time.  Referral to GYN for further evaluation.

## 2020-02-20 NOTE — Patient Instructions (Addendum)
It is very important that  you speak with Dr. [name] next month regards to the night sweats and continued fatigue.  We have ordered labs, chest x-ray and referral to pulmonology for sleep study.  I have also referred you to GYN for the episode of vaginal bleeding that you had  Let us know if you dont hear back within a week in regards to an appointment being scheduled.   For the nausea, I like for you to small frequent meals and also keep a journal to see if we can identify any triggers for the nausea that you are having.  Continue Pepcid and also start prilosec as well.   Please continue walking with you dog and work to increase your acitivity

## 2020-02-23 ENCOUNTER — Telehealth: Payer: Self-pay | Admitting: Family

## 2020-02-23 NOTE — Telephone Encounter (Signed)
-----   Message from Midge Minium, MD sent at 02/21/2020  3:05 PM EDT ----- We will see her and talk about a plan.  The  recommendation are a single  endo which she has had.  But may need to repeat with random biopsies.  Thanks Darren ----- Message ----- From: Allegra Grana, FNP Sent: 02/21/2020  12:59 PM EDT To: Midge Minium, MD  Dr Servando Snare,  [facility] you are well.   Ms Pair has positive intrinsic factor ( interestingly negative 3 years ago?).  Currently she is on b12 injections here.  Bariatric surgery 2013. Her last EGD was 2018.    Does she need to come see you sooner for surveillance due to increased risk GI malignancy?    Appreciate your advice, always  Claris Che

## 2020-02-23 NOTE — Telephone Encounter (Signed)
Call pt I consulted with GI dr Servando Snare in regards to her recent diagnosis of pernicious anemia as it can increase risk for gastric cancer.  He advised her to make follow-up with him and she may need to repeat upper scope   Please reiterate the importance of her calling to schedule

## 2020-02-28 ENCOUNTER — Ambulatory Visit (INDEPENDENT_AMBULATORY_CARE_PROVIDER_SITE_OTHER): Payer: BC Managed Care – PPO

## 2020-02-28 ENCOUNTER — Other Ambulatory Visit: Payer: Self-pay

## 2020-02-28 DIAGNOSIS — E538 Deficiency of other specified B group vitamins: Secondary | ICD-10-CM | POA: Diagnosis not present

## 2020-02-28 MED ORDER — CYANOCOBALAMIN 1000 MCG/ML IJ SOLN
1000.0000 ug | Freq: Once | INTRAMUSCULAR | Status: AC
  Administered 2020-02-28: 1000 ug via INTRAMUSCULAR

## 2020-02-28 NOTE — Progress Notes (Signed)
Patient presented for B 12 injection to right deltoid, patient voiced no concerns nor showed any signs of distress during injection. 

## 2020-02-29 DIAGNOSIS — M5126 Other intervertebral disc displacement, lumbar region: Secondary | ICD-10-CM | POA: Diagnosis not present

## 2020-02-29 DIAGNOSIS — M5137 Other intervertebral disc degeneration, lumbosacral region: Secondary | ICD-10-CM | POA: Diagnosis not present

## 2020-02-29 DIAGNOSIS — G8929 Other chronic pain: Secondary | ICD-10-CM | POA: Diagnosis not present

## 2020-02-29 DIAGNOSIS — M533 Sacrococcygeal disorders, not elsewhere classified: Secondary | ICD-10-CM | POA: Diagnosis not present

## 2020-03-01 ENCOUNTER — Other Ambulatory Visit: Payer: Self-pay | Admitting: Family

## 2020-03-01 DIAGNOSIS — R5382 Chronic fatigue, unspecified: Secondary | ICD-10-CM

## 2020-03-05 NOTE — Progress Notes (Signed)
NEUROLOGY CONSULTATION NOTE  [name] MRN: [id number] DOB: May 18, 1966  Referring provider: Rennie Plowman, FNP Primary care provider: Rennie Plowman, FNP  Reason for consult:  headache  HISTORY OF PRESENT ILLNESS: [name] is a 54 year old right-handed Caucasian female with type 2 diabetes, chronic back pain, depression and anxiety and history of gastric bypass surgery who presents for headaches.  History supplemented by referring provider's note.  Onset:   After a fall in April 2020, hitting her head.  Location:  Often starts in the temples and may move across forehead or back of head Quality:  Starts as pressure and progresses to a sharp pain Intensity:  Usually moderate, sometimes severe Aura:  none Premonitory Phase:  none Postdrome:  none Associated symptoms:  Occasional nausea, photophobia, phonophobia.  She denies associated visual disturbance, unilateral numbness or weakness. Duration:  30 to 45 minutes Frequency:  Daily, sometimes more than once a day Frequency of abortive medication: 5 out of 7 days a week.  Excedrin at least twice a week, otherwise may take Tylenol Triggers:  no Relieving factors:  Excedrin Activity:  Aggravates  Previous history of different headaches, migraines that would put her in bed, with nausea, photophobia, phonophobia  Current NSAIDS:  none Current analgesics:  Fentanyl patch (chronic back pain) Percocet (back pain), Excedrin, Tylenol Current triptans:  none Current ergotamine:  none Current anti-emetic:  Zofran ODT 4mg , promethazine 12.5mg  Current muscle relaxants:  Skelaxin Current anti-anxiolytic:  none Current sleep aide:  trazodone Current Antihypertensive medications:  none Current Antidepressant medications:  Cymbalta 60mg  daily, trazodone Current Anticonvulsant medications:  Gabapentin 800mg  TID Current anti-CGRP:  none Current Vitamins/Herbal/Supplements:  Magnesium citrate 400mg  Current  Antihistamines/Decongestants:  none Other therapy:  none Hormone/birth control:  none   Past NSAIDS:  naproxen Past analgesics:  none Past abortive triptans:  none Past abortive ergotamine:  none Past muscle relaxants:  none Past anti-emetic:  none Past antihypertensive medications:  propranolol Past antidepressant medications:  Venlafaxine; citalopram, fluoxetine, Wellbutrin Past anticonvulsant medications:  Lyrica Past anti-CGRP:  none Past vitamins/Herbal/Supplements:  none Past antihistamines/decongestants:  meclizine Other past therapies:  none  Caffeine:  Rarely drinks coffee.  No caffeinated soda. Diet:  Drinks a lot of water.  Drinks protein drink for breakfast and lunch.  Sometimes has decreased appetite since gastric bypass in 2013. Exercise:  Not routine (limited due to back pain) Depression: yes; Anxiety:  no Other pain:  Chronic back pain.  Sometimes neck pain. Sleep:  Good (due to medications) Family history of headache:  No  Imaging (personally reviewed): 01/02/2019 CT HEAD WO:  Stable right frontal encephalomalacia but no acute intracranial abnormality. 09/21/2018 CTA HEAD & NECK:  Negative   PAST MEDICAL HISTORY: Past Medical History:  Diagnosis Date  . Anemia   . Anxiety and depression   . Chronic back pain   . DM2 (diabetes mellitus, type 2) (HCC)    improved since gastric bypass  . Headache    stress/sinus - 2-3x/wk  . History of left shoulder fracture   . HLD (hyperlipidemia)   . HTN (hypertension)    improved since gastric bypass  . Hypothyroid   . Left leg weakness    s/p back surgery  . Obesity   . S/P insertion of spinal cord stimulator   . Wears contact lenses     PAST SURGICAL HISTORY: Past Surgical History:  Procedure Laterality Date  . Abd - gallstones  5/03  . BACK SURGERY  2009  decompressive laminectomy  . BARIATRIC SURGERY  03/02/2012   gastric bypass; Dr Darnell Level  . CHOLECYSTECTOMY  6/03  . COLONOSCOPY WITH PROPOFOL N/A  05/01/2017   Procedure: COLONOSCOPY WITH PROPOFOL;  Surgeon: Lucilla Lame, MD;  Location: [place];  Service: Gastroenterology;  Laterality: N/A;  . DILATION AND CURETTAGE OF UTERUS  1/05  . ESOPHAGOGASTRODUODENOSCOPY (EGD) WITH PROPOFOL N/A 05/01/2017   Procedure: ESOPHAGOGASTRODUODENOSCOPY (EGD) WITH PROPOFOL;  Surgeon: Lucilla Lame, MD;  Location: [place];  Service: Gastroenterology;  Laterality: N/A;  . KNEE SURGERY     L - arthroscopic  . LIVER BIOPSY  6/03   fatty liver  . LUMBAR SPINE SURGERY  2015   Dr. Mick Sell  . NASAL SINUS SURGERY    . PANNICULECTOMY  08/2013   [facility], Dr. Chrisandra Carota  . sleep study - apnea  04/2001   improved since gastric bypass    MEDICATIONS: Current Outpatient Medications on File Prior to Visit  Medication Sig Dispense Refill  . DULoxetine (CYMBALTA) 60 MG capsule Take 60 mg by mouth daily.    . famotidine (PEPCID) 20 MG tablet TAKE 1 TABLET BY MOUTH TWICE A DAY 60 tablet 1  . fentaNYL (DURAGESIC - DOSED MCG/HR) 50 MCG/HR APPLY 1 PATCH(ES) EVERY 72 HOURS BY TRANSDERMAL ROUTE.  0  . folic acid (FOLVITE) 1 MG tablet Take 1 tablet (1 mg total) by mouth daily. 90 tablet 3  . gabapentin (NEURONTIN) 800 MG tablet Take 800 mg by mouth 3 (three) times daily.    Marland Kitchen levothyroxine (SYNTHROID) 100 MCG tablet TAKE 1 TABLET BY MOUTH EVERY DAY 90 tablet 1  . Magnesium Citrate 200 MG TABS Take 400 mg by mouth daily. 120 tablet 1  . metaxalone (SKELAXIN) 800 MG tablet Take 800 mg by mouth 3 (three) times daily.    . naloxone (NARCAN) nasal spray 4 mg/0.1 mL Place 1 spray into the nose as needed (in case of too much pain patch in her system).    Marland Kitchen omeprazole (PRILOSEC) 20 MG capsule Take 1 capsule (20 mg total) by mouth daily. 30 capsule 3  . ondansetron (ZOFRAN ODT) 4 MG disintegrating tablet Take 1 tablet (4 mg total) by mouth every 8 (eight) hours as needed for nausea or vomiting. 30 tablet 1  . oxyCODONE-acetaminophen (PERCOCET/ROXICET) 5-325 MG tablet  Take 1 tablet by mouth 2 (two) times daily as needed.    Marland Kitchen PREVIDENT 5000 DRY MOUTH 1.1 % GEL dental gel 3 (three) times daily.    . promethazine (PHENERGAN) 12.5 MG tablet Take 12.5 mg by mouth 4 (four) times daily as needed.    . SODIUM FLUORIDE 5000 PPM 1.1 % PSTE 3 (three) times daily.    . traZODone (DESYREL) 100 MG tablet Take 1 tablet (100 mg total) by mouth at bedtime. 90 tablet 1   No current facility-administered medications on file prior to visit.    ALLERGIES: Allergies  Allergen Reactions  . Latex Rash  . Penicillins Itching and Swelling    Has patient had a PCN reaction causing immediate rash, facial/tongue/throat swelling, SOB or lightheadedness with hypotension: yes Has patient had a PCN reaction causing severe rash involving mucus membranes or skin necrosis: no Has patient had a PCN reaction that required hospitalization : yes ed visit Has patient had a PCN reaction occurring within the last 10 years: yes If all of the above answers are "NO", then may proceed with Cephalosporin use.     FAMILY HISTORY: Family History  Problem Relation Age of  Onset  . Lung cancer Father   . Diabetes Mother   . Heart failure Mother   . Diabetes Sister   . Fibromyalgia Sister   . Colon cancer Maternal Aunt   . Skin cancer Paternal Aunt   . Skin cancer Paternal Uncle    SOCIAL HISTORY: Social History   Socioeconomic History  . Marital status: Married    Spouse name: Not on file  . Number of children: Not on file  . Years of education: Not on file  . Highest education level: Not on file  Occupational History  . Not on file  Tobacco Use  . Smoking status: Never Smoker  . Smokeless tobacco: Never Used  . Tobacco comment: tobacco use - no  Vaping Use  . Vaping Use: Never used  Substance and Sexual Activity  . Alcohol use: No  . Drug use: No  . Sexual activity: Not Currently    Birth control/protection: None  Other Topics Concern  . Not on file  Social History  Narrative   Partner- [name], no children; works in [place], does not get regular exercise.    Social Determinants of Health   Financial Resource Strain:   . Difficulty of Paying Living Expenses:   Food Insecurity:   . Worried About Programme researcher, broadcasting/film/video in the Last Year:   . Barista in the Last Year:   Transportation Needs:   . Freight forwarder (Medical):   Marland Kitchen Lack of Transportation (Non-Medical):   Physical Activity:   . Days of Exercise per Week:   . Minutes of Exercise per Session:   Stress:   . Feeling of Stress :   Social Connections:   . Frequency of Communication with Friends and Family:   . Frequency of Social Gatherings with Friends and Family:   . Attends Religious Services:   . Active Member of Clubs or Organizations:   . Attends Banker Meetings:   Marland Kitchen Marital Status:   Intimate Partner Violence:   . Fear of Current or Ex-Partner:   . Emotionally Abused:   Marland Kitchen Physically Abused:   . Sexually Abused:     PHYSICAL EXAM: Blood pressure (!) 144/68, pulse 69, resp. rate 20, height 5\' 4"  (1.626 m), weight 216 lb (98 kg), last menstrual period 12/16/2015, SpO2 95 %. General: No acute distress.  Patient appears well-groomed.   Head:  Normocephalic/atraumatic Eyes:  fundi examined but not visualized Neck: supple, no paraspinal tenderness, full range of motion Back: No paraspinal tenderness Heart: regular rate and rhythm Lungs: Clear to auscultation bilaterally. Vascular: No carotid bruits. Neurological Exam: Mental status: alert and oriented to person, place, and time, recent and remote memory intact, fund of knowledge intact, attention and concentration intact, speech fluent and not dysarthric, language intact. Cranial nerves: CN I: not tested CN II: pupils equal, round and reactive to light, visual fields intact CN III, IV, VI:  full range of motion, no nystagmus, no ptosis CN V: facial sensation intact CN VII: upper and lower face symmetric CN  VIII: hearing intact CN IX, X: gag intact, uvula midline CN XI: sternocleidomastoid and trapezius muscles intact CN XII: tongue midline Bulk & Tone: normal, no fasciculations. Motor:  5/5 throughout  Sensation:  Pinprick sensation reduced in left foot.  Otherwise pinprick and vibration sensation intact.   Deep Tendon Reflexes:  2+ throughout,  toes downgoing.   Finger to nose testing:  Without dysmetria.   Heel to shin:  Without dysmetria.  Gait:  Normal station and stride.  Romberg negative.  IMPRESSION: Chronic migraine without aura, without status migrainosus, not intractable  PLAN: 1.  For preventative management, aimovig 70mg  every 28 days 2.  For abortive therapy, Excedrin or Tylenol 3.  Limit use of pain relievers to no more than 2 days out of week to prevent risk of rebound or medication-overuse headache. 4.  Keep headache diary 5.  Exercise, hydration, caffeine cessation, sleep hygiene, monitor for and avoid triggers 6.  Consider:  magnesium citrate 400mg  daily, riboflavin 400mg  daily, and coenzyme Q10 100mg  three times daily 7. Always keep in mind that currently taking a hormone or birth control may be a possible trigger or aggravating factor for migraine. 8. Follow up 4 months.   Thank you for allowing me to take part in the care of this patient.  , DO  CC: , FNP

## 2020-03-06 ENCOUNTER — Encounter: Payer: Self-pay | Admitting: Neurology

## 2020-03-06 ENCOUNTER — Other Ambulatory Visit: Payer: Self-pay

## 2020-03-06 ENCOUNTER — Ambulatory Visit (INDEPENDENT_AMBULATORY_CARE_PROVIDER_SITE_OTHER): Payer: BC Managed Care – PPO | Admitting: Neurology

## 2020-03-06 VITALS — BP 144/68 | HR 69 | Resp 20 | Ht 64.0 in | Wt 216.0 lb

## 2020-03-06 DIAGNOSIS — G43709 Chronic migraine without aura, not intractable, without status migrainosus: Secondary | ICD-10-CM

## 2020-03-06 MED ORDER — AIMOVIG 70 MG/ML ~~LOC~~ SOAJ: 70.0000 mg | SUBCUTANEOUS | 11 refills | Status: DC

## 2020-03-06 NOTE — Patient Instructions (Signed)
  1. Start Aimovig 70mg  injection every 28 days 2. Limit use of pain relievers to no more than 2 days out of the week.  These medications include acetaminophen, NSAIDs (ibuprofen/Advil/Motrin, naproxen/Aleve, triptans (Imitrex/sumatriptan), Excedrin, and narcotics.  This will help reduce risk of rebound headaches. 3. Be aware of common food triggers:  - Caffeine:  coffee, black tea, cola, Mt. Dew  - Chocolate  - Dairy:  aged cheeses (brie, blue, cheddar, gouda, [place], provolone, [place], Swiss, etc), chocolate milk, buttermilk, sour cream, limit eggs and yogurt  - Nuts, peanut butter  - Alcohol  - Cereals/grains:  FRESH breads (fresh bagels, sourdough, doughnuts), yeast productions  - Processed/canned/aged/cured meats (pre-packaged deli meats, hotdogs)  - MSG/glutamate:  soy sauce, flavor enhancer, pickled/preserved/marinated foods  - Sweeteners:  aspartame (Equal, Nutrasweet).  Sugar and Splenda are okay  - Vegetables:  legumes (lima beans, lentils, snow peas, fava beans, pinto peans, peas, garbanzo beans), sauerkraut, onions, olives, pickles  - Fruit:  avocados, bananas, citrus fruit (orange, lemon, grapefruit), mango  - Other:  Frozen meals, macaroni and cheese 4. Routine exercise 5. Stay adequately hydrated (aim for 64 oz water daily) 6. Keep headache diary 7. Maintain proper stress management 8. Maintain proper sleep hygiene 9. Do not skip meals 10. Consider supplements:  magnesium citrate 400mg  daily, riboflavin 400mg  daily, coenzyme Q10 100mg  three times daily.

## 2020-03-07 ENCOUNTER — Encounter: Payer: Self-pay | Admitting: Family

## 2020-03-07 ENCOUNTER — Encounter: Payer: Self-pay | Admitting: Neurology

## 2020-03-07 NOTE — Progress Notes (Signed)
I left a msg with [employer] for her PA on the aimovig medication. [employer] outsourced to the [employer] for [employer]. Awaiting call back (phone number [phone])

## 2020-03-08 ENCOUNTER — Telehealth: Payer: Self-pay | Admitting: Family

## 2020-03-08 DIAGNOSIS — D510 Vitamin B12 deficiency anemia due to intrinsic factor deficiency: Secondary | ICD-10-CM

## 2020-03-08 NOTE — Telephone Encounter (Signed)
Patient calling back in and informed of the below.   Patient verbalized understanding and given the number to call.

## 2020-03-08 NOTE — Telephone Encounter (Signed)
[facility] called stating pt needs a referral also is referral Urgent or Routine? After that pt will be able to scheduled. Please advise and Thank you!

## 2020-03-08 NOTE — Telephone Encounter (Signed)
Left message to return call 

## 2020-03-09 NOTE — Telephone Encounter (Signed)
Call pt Referral placed to Dr Servando Snare for eval of pernicious anemia. This can be routine

## 2020-03-09 NOTE — Addendum Note (Signed)
Addended by: Allegra Grana on: 03/09/2020 06:24 AM   Modules accepted: Orders

## 2020-03-09 NOTE — Telephone Encounter (Signed)
Pt has been notified.

## 2020-03-13 ENCOUNTER — Encounter: Payer: Self-pay | Admitting: Certified Nurse Midwife

## 2020-03-13 ENCOUNTER — Other Ambulatory Visit: Payer: Self-pay

## 2020-03-13 ENCOUNTER — Ambulatory Visit (INDEPENDENT_AMBULATORY_CARE_PROVIDER_SITE_OTHER): Payer: BC Managed Care – PPO | Admitting: Certified Nurse Midwife

## 2020-03-13 VITALS — BP 117/97 | HR 89 | Ht 64.0 in | Wt 215.0 lb

## 2020-03-13 DIAGNOSIS — N951 Menopausal and female climacteric states: Secondary | ICD-10-CM | POA: Diagnosis not present

## 2020-03-13 MED ORDER — PREMPRO 0.3-1.5 MG PO TABS: 1.0000 | ORAL_TABLET | Freq: Every day | ORAL | 0 refills | Status: DC

## 2020-03-13 NOTE — Progress Notes (Addendum)
GYN ENCOUNTER NOTE  Subjective:       [name] is a 54 y.o. G0P0000 female is here for gynecologic evaluation of the following issues:  1. Hot flashes and bleeding x 1 in march. Pt state she had one period in march and had not had a cycle in yrs. She has also been having hot flashes that is unberable. They worsen through out the day.      Gynecologic History Patient's last menstrual period was 12/16/2015. Contraception: none Last Pap: 03/14/19. Results were: normal, HPV negative Last mammogram ordered, has not been done.   Obstetric History OB History  Gravida Para Term Preterm AB Living  0 0 0 0 0 0  SAB TAB Ectopic Multiple Live Births  0 0 0 0 0    Past Medical History:  Diagnosis Date  . Anemia   . Anxiety and depression   . Chronic back pain   . DM2 (diabetes mellitus, type 2) (HCC)    improved since gastric bypass  . Headache    stress/sinus - 2-3x/wk  . History of left shoulder fracture   . HLD (hyperlipidemia)   . HTN (hypertension)    improved since gastric bypass  . Hypothyroid   . Left leg weakness    s/p back surgery  . Obesity   . S/P insertion of spinal cord stimulator   . Wears contact lenses     Past Surgical History:  Procedure Laterality Date  . Abd [place] - gallstones  5/03  . BACK SURGERY  2009   decompressive laminectomy  . BARIATRIC SURGERY  03/02/2012   gastric bypass; Dr Smitty Cords  . CHOLECYSTECTOMY  6/03  . COLONOSCOPY WITH PROPOFOL N/A 05/01/2017   Procedure: COLONOSCOPY WITH PROPOFOL;  Surgeon: Midge Minium, MD;  Location: [facility];  Service: Gastroenterology;  Laterality: N/A;  . DILATION AND CURETTAGE OF UTERUS  1/05  . ESOPHAGOGASTRODUODENOSCOPY (EGD) WITH PROPOFOL N/A 05/01/2017   Procedure: ESOPHAGOGASTRODUODENOSCOPY (EGD) WITH PROPOFOL;  Surgeon: Midge Minium, MD;  Location: [facility];  Service: Gastroenterology;  Laterality: N/A;  . KNEE SURGERY     L - arthroscopic  . LIVER BIOPSY  6/03   fatty liver   . LUMBAR SPINE SURGERY  2015   Dr. Amalia Greenhouse  . NASAL SINUS SURGERY    . PANNICULECTOMY  08/2013   [facility], Dr. Diamond Nickel  . sleep study - apnea  04/2001   improved since gastric bypass    Current Outpatient Medications on File Prior to Visit  Medication Sig Dispense Refill  . DULoxetine (CYMBALTA) 60 MG capsule Take 60 mg by mouth daily.    Dorise Hiss (AIMOVIG) 70 MG/ML SOAJ Inject 70 mg into the skin every 28 (twenty-eight) days. 1 pen 11  . famotidine (PEPCID) 20 MG tablet TAKE 1 TABLET BY MOUTH TWICE A DAY 60 tablet 1  . fentaNYL (DURAGESIC - DOSED MCG/HR) 50 MCG/HR APPLY 1 PATCH(ES) EVERY 72 HOURS BY TRANSDERMAL ROUTE.  0  . folic acid (FOLVITE) 1 MG tablet Take 1 tablet (1 mg total) by mouth daily. 90 tablet 3  . gabapentin (NEURONTIN) 800 MG tablet Take 800 mg by mouth 3 (three) times daily.    Marland Kitchen levothyroxine (SYNTHROID) 100 MCG tablet TAKE 1 TABLET BY MOUTH EVERY DAY 90 tablet 1  . Magnesium Citrate 200 MG TABS Take 400 mg by mouth daily. 120 tablet 1  . metaxalone (SKELAXIN) 800 MG tablet Take 800 mg by mouth 3 (three) times daily.    . naloxone (  NARCAN) nasal spray 4 mg/0.1 mL Place 1 spray into the nose as needed (in case of too much pain patch in her system).    Marland Kitchen omeprazole (PRILOSEC) 20 MG capsule Take 1 capsule (20 mg total) by mouth daily. 30 capsule 3  . ondansetron (ZOFRAN ODT) 4 MG disintegrating tablet Take 1 tablet (4 mg total) by mouth every 8 (eight) hours as needed for nausea or vomiting. 30 tablet 1  . oxyCODONE-acetaminophen (PERCOCET/ROXICET) 5-325 MG tablet Take 1 tablet by mouth 2 (two) times daily as needed.    Marland Kitchen PREVIDENT 5000 DRY MOUTH 1.1 % GEL dental gel 3 (three) times daily.    . promethazine (PHENERGAN) 12.5 MG tablet Take 12.5 mg by mouth 4 (four) times daily as needed.    . SODIUM FLUORIDE 5000 PPM 1.1 % PSTE 3 (three) times daily.    . traZODone (DESYREL) 100 MG tablet Take 1 tablet (100 mg total) by mouth at bedtime. 90 tablet 1   No current  facility-administered medications on file prior to visit.    Allergies  Allergen Reactions  . Latex Rash  . Penicillins Itching and Swelling    Has patient had a PCN reaction causing immediate rash, facial/tongue/throat swelling, SOB or lightheadedness with hypotension: yes Has patient had a PCN reaction causing severe rash involving mucus membranes or skin necrosis: no Has patient had a PCN reaction that required hospitalization : yes ed visit Has patient had a PCN reaction occurring within the last 10 years: yes If all of the above answers are "NO", then may proceed with Cephalosporin use.     Social History   Socioeconomic History  . Marital status: Married    Spouse name: Not on file  . Number of children: Not on file  . Years of education: Not on file  . Highest education level: Not on file  Occupational History  . Not on file  Tobacco Use  . Smoking status: Never Smoker  . Smokeless tobacco: Never Used  . Tobacco comment: tobacco use - no  Vaping Use  . Vaping Use: Never used  Substance and Sexual Activity  . Alcohol use: No  . Drug use: No  . Sexual activity: Not Currently    Birth control/protection: None  Other Topics Concern  . Not on file  Social History Narrative   Partner- [name], no children; works in [place], does not get regular exercise.    Right handed   One story home   No caffeine   Social Determinants of Health   Financial Resource Strain:   . Difficulty of Paying Living Expenses:   Food Insecurity:   . Worried About Programme researcher, broadcasting/film/video in the Last Year:   . Barista in the Last Year:   Transportation Needs:   . Freight forwarder (Medical):   Marland Kitchen Lack of Transportation (Non-Medical):   Physical Activity:   . Days of Exercise per Week:   . Minutes of Exercise per Session:   Stress:   . Feeling of Stress :   Social Connections:   . Frequency of Communication with Friends and Family:   . Frequency of Social Gatherings with Friends  and Family:   . Attends Religious Services:   . Active Member of Clubs or Organizations:   . Attends Banker Meetings:   Marland Kitchen Marital Status:   Intimate Partner Violence:   . Fear of Current or Ex-Partner:   . Emotionally Abused:   Marland Kitchen Physically Abused:   .  Sexually Abused:     Family History  Problem Relation Age of Onset  . Lung cancer Father   . Diabetes Mother   . Heart failure Mother   . Diabetes Sister   . Fibromyalgia Sister   . Colon cancer Maternal Aunt   . Skin cancer Paternal Aunt   . Skin cancer Paternal Uncle     The following portions of the patient's history were reviewed and updated as appropriate: allergies, current medications, past family history, past medical history, past social history, past surgical history and problem list.  Review of Systems Review of Systems - Negative except as mentioned in hpi Review of Systems - General ROS: negative for - chills, fatigue, fever, hot flashes, malaise or night sweats Hematological and Lymphatic ROS: negative for - bleeding problems or swollen lymph nodes Gastrointestinal ROS: negative for - abdominal pain, blood in stools, change in bowel habits and nausea/vomiting Musculoskeletal ROS: negative for - joint pain, muscle pain or muscular weakness Genito-Urinary ROS: negative for -  dysmenorrhea, dyspareunia, dysuria, genital discharge, genital ulcers, hematuria, incontinence, irregular/heavy menses, nocturia or pelvic pain. Positive for change in cycle -post menopausal bleeding  Objective:   BP (!) 117/97   Pulse 89   Ht 5\' 4"  (1.626 m)   Wt 215 lb (97.5 kg)   LMP 12/16/2015   BMI 36.90 kg/m  CONSTITUTIONAL: Well-developed, well-nourished female in no acute distress.  HENT:  Normocephalic, atraumatic.  NECK: Normal range of motion, supple, no masses.  Normal thyroid.  SKIN: Skin is warm and dry. No rash noted. Not diaphoretic. No erythema. No pallor. NEUROLGIC: Alert and oriented to person, place, and  time. PSYCHIATRIC: Normal mood and affect. Normal behavior. Normal judgment and thought content. CARDIOVASCULAR:Not Examined RESPIRATORY: Not Examined BREASTS: Not Examined ABDOMEN: Soft, non distended; Non tender.  No Organomegaly. PELVIC:not indicated  MUSCULOSKELETAL: Normal range of motion. No tenderness.  No cyanosis, clubbing, or edema.   Assessment:   Post menopausal bleeding Hot flashes   Plan:   Discussed hormone replacement therapy, discussed self help measures, use of herbal supplements, SSRI's, soy, exercise , and lifestyle changes to help manage hot flashes. Given pt history of hypertension discussed risks of HRT. Cholesterol screening 01/2019 WNL..She denies history of cancer. She verbalizes understanding and request treatment with HRT for hot flashes. Discussed need to have estrogen and progesterone and request combined oral therapy. Prempro 0.3/1.5mg  ordered. Recommend follow up for BP monitoring. She agrees to plan. U/s order for post menopausal bleeding . Discussed endometrial biopsy if endometrium thickened. She verbalizes and agrees to plan of care. Follow up  For u/s as soon as able. Written information given to pt on HRT, menopause and lifestyle changes for managing symptoms of menopause.   Cardiovascular risk score 10 yr 2.22%  Face to face time 15 min.  02/2019, CNM

## 2020-03-13 NOTE — Patient Instructions (Signed)
Abnormal Uterine Bleeding °Abnormal uterine bleeding means bleeding more than usual from your uterus. It can include: °· Bleeding between periods. °· Bleeding after sex. °· Bleeding that is heavier than normal. °· Periods that last longer than usual. °· Bleeding after you have stopped having your period (menopause). °There are many problems that may cause this. You should see a doctor for any kind of bleeding that is not normal. Treatment depends on the cause of the bleeding. °Follow these instructions at home: °· Watch your condition for any changes. °· Do not use tampons, douche, or have sex, if your doctor tells you not to. °· Change your pads often. °· Get regular well-woman exams. Make sure they include a pelvic exam and cervical cancer screening. °· Keep all follow-up visits as told by your doctor. This is important. °Contact a doctor if: °· The bleeding lasts more than one week. °· You feel dizzy at times. °· You feel like you are going to throw up (nauseous). °· You throw up. °Get help right away if: °· You pass out. °· You have to change pads every hour. °· You have belly (abdominal) pain. °· You have a fever. °· You get sweaty. °· You get weak. °· You passing large blood clots from your vagina. °Summary °· Abnormal uterine bleeding means bleeding more than usual from your uterus. °· There are many problems that may cause this. You should see a doctor for any kind of bleeding that is not normal. °· Treatment depends on the cause of the bleeding. °This information is not intended to replace advice given to you by your health care provider. Make sure you discuss any questions you have with your health care provider. °Document Revised: 08/26/2016 Document Reviewed: 08/26/2016 °Elsevier Patient Education © 2020 Elsevier Inc. ° °

## 2020-03-13 NOTE — Progress Notes (Signed)
Called [phone] per pharm fax sheet with patient info. Pharm ID # [id number] for [employer]. Submitted verbal PA over the phone. Will have answer by 03/15/20; sending through fax it was submitted to pharm for review. REF# [id number].

## 2020-03-15 ENCOUNTER — Other Ambulatory Visit: Payer: Self-pay

## 2020-03-15 ENCOUNTER — Ambulatory Visit (INDEPENDENT_AMBULATORY_CARE_PROVIDER_SITE_OTHER): Payer: BC Managed Care – PPO

## 2020-03-15 ENCOUNTER — Other Ambulatory Visit: Payer: Self-pay | Admitting: Physician Assistant

## 2020-03-15 DIAGNOSIS — N950 Postmenopausal bleeding: Secondary | ICD-10-CM

## 2020-03-15 DIAGNOSIS — R52 Pain, unspecified: Secondary | ICD-10-CM | POA: Diagnosis not present

## 2020-03-15 NOTE — Progress Notes (Signed)
Approval fax received coverage approved  for Aimovg from 01/13/20 to 09/12/20.

## 2020-03-16 ENCOUNTER — Inpatient Hospital Stay: Payer: BC Managed Care – PPO | Attending: Oncology

## 2020-03-16 DIAGNOSIS — R748 Abnormal levels of other serum enzymes: Secondary | ICD-10-CM | POA: Diagnosis not present

## 2020-03-16 DIAGNOSIS — I10 Essential (primary) hypertension: Secondary | ICD-10-CM | POA: Diagnosis not present

## 2020-03-16 DIAGNOSIS — Z8249 Family history of ischemic heart disease and other diseases of the circulatory system: Secondary | ICD-10-CM | POA: Diagnosis not present

## 2020-03-16 DIAGNOSIS — F329 Major depressive disorder, single episode, unspecified: Secondary | ICD-10-CM | POA: Diagnosis not present

## 2020-03-16 DIAGNOSIS — D509 Iron deficiency anemia, unspecified: Secondary | ICD-10-CM

## 2020-03-16 DIAGNOSIS — E785 Hyperlipidemia, unspecified: Secondary | ICD-10-CM | POA: Diagnosis not present

## 2020-03-16 DIAGNOSIS — Z801 Family history of malignant neoplasm of trachea, bronchus and lung: Secondary | ICD-10-CM | POA: Insufficient documentation

## 2020-03-16 DIAGNOSIS — D508 Other iron deficiency anemias: Secondary | ICD-10-CM | POA: Diagnosis not present

## 2020-03-16 DIAGNOSIS — Z833 Family history of diabetes mellitus: Secondary | ICD-10-CM | POA: Insufficient documentation

## 2020-03-16 DIAGNOSIS — E119 Type 2 diabetes mellitus without complications: Secondary | ICD-10-CM | POA: Diagnosis not present

## 2020-03-16 DIAGNOSIS — Z79899 Other long term (current) drug therapy: Secondary | ICD-10-CM | POA: Diagnosis not present

## 2020-03-16 DIAGNOSIS — E039 Hypothyroidism, unspecified: Secondary | ICD-10-CM | POA: Diagnosis not present

## 2020-03-16 DIAGNOSIS — Z793 Long term (current) use of hormonal contraceptives: Secondary | ICD-10-CM | POA: Insufficient documentation

## 2020-03-16 DIAGNOSIS — Z800 Family history of malignant neoplasm of digestive organs: Secondary | ICD-10-CM | POA: Diagnosis not present

## 2020-03-16 DIAGNOSIS — Z9884 Bariatric surgery status: Secondary | ICD-10-CM | POA: Diagnosis not present

## 2020-03-16 DIAGNOSIS — E538 Deficiency of other specified B group vitamins: Secondary | ICD-10-CM

## 2020-03-16 DIAGNOSIS — F419 Anxiety disorder, unspecified: Secondary | ICD-10-CM | POA: Insufficient documentation

## 2020-03-16 LAB — CBC WITH DIFFERENTIAL/PLATELET
Abs Immature Granulocytes: 0 10*3/uL (ref 0.00–0.07)
Basophils Absolute: 0 10*3/uL (ref 0.0–0.1)
Basophils Relative: 0 %
Eosinophils Absolute: 0.2 10*3/uL (ref 0.0–0.5)
Eosinophils Relative: 6 %
HCT: 35.7 % — ABNORMAL LOW (ref 36.0–46.0)
Hemoglobin: 11.8 g/dL — ABNORMAL LOW (ref 12.0–15.0)
Immature Granulocytes: 0 %
Lymphocytes Relative: 29 %
Lymphs Abs: 1.1 10*3/uL (ref 0.7–4.0)
MCH: 31.6 pg (ref 26.0–34.0)
MCHC: 33.1 g/dL (ref 30.0–36.0)
MCV: 95.7 fL (ref 80.0–100.0)
Monocytes Absolute: 0.5 10*3/uL (ref 0.1–1.0)
Monocytes Relative: 12 %
Neutro Abs: 2 10*3/uL (ref 1.7–7.7)
Neutrophils Relative %: 53 %
Platelets: 173 10*3/uL (ref 150–400)
RBC: 3.73 MIL/uL — ABNORMAL LOW (ref 3.87–5.11)
RDW: 13.1 % (ref 11.5–15.5)
WBC: 3.8 10*3/uL — ABNORMAL LOW (ref 4.0–10.5)
nRBC: 0 % (ref 0.0–0.2)

## 2020-03-16 LAB — COMPREHENSIVE METABOLIC PANEL
ALT: 40 U/L (ref 0–44)
AST: 38 U/L (ref 15–41)
Albumin: 3.6 g/dL (ref 3.5–5.0)
Alkaline Phosphatase: 65 U/L (ref 38–126)
Anion gap: 8 (ref 5–15)
BUN: 13 mg/dL (ref 6–20)
CO2: 30 mmol/L (ref 22–32)
Calcium: 8.6 mg/dL — ABNORMAL LOW (ref 8.9–10.3)
Chloride: 101 mmol/L (ref 98–111)
Creatinine, Ser: 0.72 mg/dL (ref 0.44–1.00)
GFR calc Af Amer: >60 mL/min (ref >60)
GFR calc non Af Amer: >60 mL/min (ref >60)
Glucose, Bld: 98 mg/dL (ref 70–99)
Potassium: 4.2 mmol/L (ref 3.5–5.1)
Sodium: 139 mmol/L (ref 135–145)
Total Bilirubin: 0.3 mg/dL (ref 0.3–1.2)
Total Protein: 7.1 g/dL (ref 6.5–8.1)

## 2020-03-16 LAB — VITAMIN B12: Vitamin B-12: 339 pg/mL (ref 180–914)

## 2020-03-16 LAB — IRON AND TIBC
Iron: 45 ug/dL (ref 28–170)
Saturation Ratios: 15 % (ref 10.4–31.8)
TIBC: 308 ug/dL (ref 250–450)
UIBC: 263 ug/dL

## 2020-03-16 LAB — FERRITIN: Ferritin: 108 ng/mL (ref 11–307)

## 2020-03-19 ENCOUNTER — Other Ambulatory Visit: Payer: Self-pay | Admitting: Certified Nurse Midwife

## 2020-03-20 ENCOUNTER — Inpatient Hospital Stay (HOSPITAL_BASED_OUTPATIENT_CLINIC_OR_DEPARTMENT_OTHER): Payer: BC Managed Care – PPO | Admitting: Oncology

## 2020-03-20 ENCOUNTER — Other Ambulatory Visit: Payer: Self-pay

## 2020-03-20 DIAGNOSIS — D509 Iron deficiency anemia, unspecified: Secondary | ICD-10-CM

## 2020-03-20 DIAGNOSIS — R748 Abnormal levels of other serum enzymes: Secondary | ICD-10-CM | POA: Diagnosis not present

## 2020-03-20 DIAGNOSIS — R948 Abnormal results of function studies of other organs and systems: Secondary | ICD-10-CM | POA: Diagnosis not present

## 2020-03-20 DIAGNOSIS — E538 Deficiency of other specified B group vitamins: Secondary | ICD-10-CM | POA: Diagnosis not present

## 2020-03-20 NOTE — Progress Notes (Signed)
Patient she could not afford to get the bone density scan at this time. [facility]

## 2020-03-21 NOTE — Progress Notes (Signed)
I connected with [name] on 03/21/20 at  2:15 PM EDT by video enabled telemedicine visit and verified that I am speaking with the correct person using two identifiers.   I discussed the limitations, risks, security and privacy concerns of performing an evaluation and management service by telemedicine and the availability of in-person appointments. I also discussed with the patient that there may be a patient responsible charge related to this service. The patient expressed understanding and agreed to proceed.  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 She subsequently did not follow-up. She has been referred to me for elevated alkaline phosphatase level. She currently sees Dr. Servando Snare and was noted to have isolated elevated alkaline phosphatase. GGT is normal. She was noted to have a low vitamin D level of 19 in May 2020. PTH and calcium levels were normal. Fractionated alkaline phosphatase showed normal bone intestinal and liver fraction   Interval history patient was diagnosed with pernicious anemia and is on monthly B12 shots.  Denies other complaints at this time   Review of Systems  Constitutional: Negative for chills, fever, malaise/fatigue and weight loss.  HENT: Negative for congestion, ear discharge and nosebleeds.   Eyes: Negative for blurred vision.  Respiratory: Negative for cough, hemoptysis, sputum production, shortness of breath and wheezing.   Cardiovascular: Negative for chest pain, palpitations, orthopnea and  claudication.  Gastrointestinal: Negative for abdominal pain, blood in stool, constipation, diarrhea, heartburn, melena, nausea and vomiting.  Genitourinary: Negative for dysuria, flank pain, frequency, hematuria and urgency.  Musculoskeletal: Negative for back pain, joint pain and myalgias.  Skin: Negative for rash.  Neurological: Negative for dizziness, tingling, focal weakness, seizures, weakness and headaches.  Endo/Heme/Allergies: Does not bruise/bleed easily.  Psychiatric/Behavioral: Negative for depression and suicidal ideas. The patient does not have insomnia.     Allergies  Allergen Reactions  . Latex Rash  . Penicillins Itching and Swelling    Has patient had a PCN reaction causing immediate rash, facial/tongue/throat swelling, SOB or lightheadedness with hypotension: yes Has patient had a PCN reaction causing severe rash involving mucus membranes or skin necrosis: no Has patient had a PCN reaction that required hospitalization : yes ed visit Has patient had a PCN reaction occurring within the last 10 years: yes If all of the above answers are "NO", then may proceed with Cephalosporin use.     Past Medical History:  Diagnosis Date  . Anemia   . Anxiety and depression   . Chronic back pain   . DM2 (diabetes mellitus, type 2) (HCC)    improved since gastric bypass  . Headache    stress/sinus - 2-3x/wk  . History of left shoulder fracture   . HLD (hyperlipidemia)   . HTN (hypertension)    improved since gastric bypass  . Hypothyroid   . Left leg weakness    s/p back surgery  . Migraines   . Obesity   . Pernicious anemia   . S/P insertion of spinal cord stimulator   . Wears contact lenses     Past Surgical History:  Procedure  Laterality Date  . Abd [place] - gallstones  5/03  . BACK SURGERY  2009   decompressive laminectomy  . BARIATRIC SURGERY  03/02/2012   gastric bypass; Dr Smitty Cords  . CHOLECYSTECTOMY  6/03  . COLONOSCOPY WITH PROPOFOL N/A 05/01/2017   Procedure:  COLONOSCOPY WITH PROPOFOL;  Surgeon: Midge Minium, MD;  Location: [facility];  Service: Gastroenterology;  Laterality: N/A;  . DILATION AND CURETTAGE OF UTERUS  1/05  . ESOPHAGOGASTRODUODENOSCOPY (EGD) WITH PROPOFOL N/A 05/01/2017   Procedure: ESOPHAGOGASTRODUODENOSCOPY (EGD) WITH PROPOFOL;  Surgeon: Midge Minium, MD;  Location: [facility];  Service: Gastroenterology;  Laterality: N/A;  . KNEE SURGERY     L - arthroscopic  . LIVER BIOPSY  6/03   fatty liver  . LUMBAR SPINE SURGERY  2015   Dr. Amalia Greenhouse  . NASAL SINUS SURGERY    . PANNICULECTOMY  08/2013   [facility], Dr. Diamond Nickel  . SHOULDER ARTHROSCOPY Left 10/06/2019  . sleep study - apnea  04/2001   improved since gastric bypass    Social History   Socioeconomic History  . Marital status: Married    Spouse name: Not on file  . Number of children: Not on file  . Years of education: Not on file  . Highest education level: Not on file  Occupational History  . Not on file  Tobacco Use  . Smoking status: Never Smoker  . Smokeless tobacco: Never Used  . Tobacco comment: tobacco use - no  Vaping Use  . Vaping Use: Never used  Substance and Sexual Activity  . Alcohol use: No  . Drug use: No  . Sexual activity: Not Currently    Birth control/protection: None  Other Topics Concern  . Not on file  Social History Narrative   Partner- [name], no children; works in [place], does not get regular exercise.    Right handed   One story home   No caffeine   Social Determinants of Health   Financial Resource Strain:   . Difficulty of Paying Living Expenses:   Food Insecurity:   . Worried About Programme researcher, broadcasting/film/video in the Last Year:   . Barista in the Last Year:   Transportation Needs:   . Freight forwarder (Medical):   Marland Kitchen Lack of Transportation (Non-Medical):   Physical Activity:   . Days of Exercise per Week:   . Minutes of Exercise per Session:   Stress:   . Feeling of Stress :   Social Connections:   .  Frequency of Communication with Friends and Family:   . Frequency of Social Gatherings with Friends and Family:   . Attends Religious Services:   . Active Member of Clubs or Organizations:   . Attends Banker Meetings:   Marland Kitchen Marital Status:   Intimate Partner Violence:   . Fear of Current or Ex-Partner:   . Emotionally Abused:   Marland Kitchen Physically Abused:   . Sexually Abused:     Family History  Problem Relation Age of Onset  . Lung cancer Father   . Diabetes Mother   . Heart failure Mother   . Diabetes Sister   . Fibromyalgia Sister   . Colon cancer Maternal Aunt   . Skin cancer Paternal Aunt   . Skin cancer Paternal Uncle      Current Outpatient Medications:  .  DULoxetine (CYMBALTA) 60 MG capsule, Take 60 mg by mouth daily., Disp: , Rfl:  .  Erenumab-aooe (AIMOVIG) 70 MG/ML SOAJ, Inject  70 mg into the skin every 28 (twenty-eight) days., Disp: 1 pen, Rfl: 11 .  famotidine (PEPCID) 20 MG tablet, TAKE 1 TABLET BY MOUTH TWICE A DAY, Disp: 60 tablet, Rfl: 1 .  fentaNYL (DURAGESIC - DOSED MCG/HR) 50 MCG/HR, APPLY 1 PATCH(ES) EVERY 72 HOURS BY TRANSDERMAL ROUTE., Disp: , Rfl: 0 .  folic acid (FOLVITE) 1 MG tablet, Take 1 tablet (1 mg total) by mouth daily., Disp: 90 tablet, Rfl: 3 .  gabapentin (NEURONTIN) 800 MG tablet, Take 800 mg by mouth 3 (three) times daily., Disp: , Rfl:  .  levothyroxine (SYNTHROID) 100 MCG tablet, TAKE 1 TABLET BY MOUTH EVERY DAY, Disp: 90 tablet, Rfl: 1 .  Magnesium Citrate 200 MG TABS, Take 400 mg by mouth daily., Disp: 120 tablet, Rfl: 1 .  metaxalone (SKELAXIN) 800 MG tablet, Take 800 mg by mouth 3 (three) times daily., Disp: , Rfl:  .  naloxone (NARCAN) nasal spray 4 mg/0.1 mL, Place 1 spray into the nose as needed (in case of too much pain patch in her system)., Disp: , Rfl:  .  omeprazole (PRILOSEC) 20 MG capsule, Take 1 capsule (20 mg total) by mouth daily., Disp: 30 capsule, Rfl: 3 .  oxyCODONE-acetaminophen (PERCOCET/ROXICET) 5-325 MG tablet,  Take 1 tablet by mouth 2 (two) times daily as needed., Disp: , Rfl:  .  PREVIDENT 5000 DRY MOUTH 1.1 % GEL dental gel, 3 (three) times daily., Disp: , Rfl:  .  promethazine (PHENERGAN) 12.5 MG tablet, Take 12.5 mg by mouth 4 (four) times daily as needed., Disp: , Rfl:  .  SODIUM FLUORIDE 5000 PPM 1.1 % PSTE, 3 (three) times daily., Disp: , Rfl:  .  traZODone (DESYREL) 100 MG tablet, Take 1 tablet (100 mg total) by mouth at bedtime., Disp: 90 tablet, Rfl: 1 .  estrogen, conjugated,-medroxyprogesterone (PREMPRO) 0.3-1.5 MG tablet, Take 1 tablet by mouth daily. (Patient not taking: Reported on 03/20/2020), Disp: 30 tablet, Rfl: 0 .  ondansetron (ZOFRAN ODT) 4 MG disintegrating tablet, Take 1 tablet (4 mg total) by mouth every 8 (eight) hours as needed for nausea or vomiting. (Patient not taking: Reported on 03/20/2020), Disp: 30 tablet, Rfl: 1  US PELVIS (TRANSABDOMINAL ONLY)  Result Date: 03/20/2020 Patient Name: Jaquasia Doscher DOB: [birth year] MRN: [id number] ULTRASOUND REPORT Location: [facility] Date of Service: 03/15/2020 Indications:AUB Findings: The uterus is anteverted and measures 8.0 x 35. X 5.1 cm. Echo texture is homogenous without evidence of focal masses. The Endometrium measures 10.4 mm. Right Ovary measures 2.6 x 1.6 x 2.0 cm. It is normal in appearance. Left Ovary measures 3.2 x 2.5 x 2.7 cm. It is normal in appearance. Survey of the adnexa demonstrates no adnexal masses. There is no free fluid in the cul de sac. Impression: 1. Endometrium is thickned if pt is menopausal -  measuring 10.4 mm. Otherwise normal. Recommendations: 1.Clinical correlation with the patient's History and Physical Exam. Jenine M. Marciano Sequin    RDMS The ultrasound images and findings were reviewed by me and I agree with the above report. Elonda Husky, M.D. 03/20/2020 12:35 PM   No images are attached to the encounter.   CMP Latest Ref Rng & Units 03/16/2020  Glucose 70 - 99 mg/dL 98  BUN 6 - 20 mg/dL 13   Creatinine 6.57 - 1.00 mg/dL 8.46  Sodium 962 - 952 mmol/L 139  Potassium 3.5 - 5.1 mmol/L 4.2  Chloride 98 - 111 mmol/L 101  CO2 22 - 32 mmol/L 30  Calcium  8.9 - 10.3 mg/dL 5.6(O8.6(L)  Total Protein 6.5 - 8.1 g/dL 7.1  Total Bilirubin 0.3 - 1.2 mg/dL 0.3  Alkaline Phos 38 - 126 U/L 65  AST 15 - 41 U/L 38  ALT 0 - 44 U/L 40   CBC Latest Ref Rng & Units 03/16/2020  WBC 4.0 - 10.5 K/uL 3.8(L)  Hemoglobin 12.0 - 15.0 g/dL 11.8(L)  Hematocrit 36 - 46 % 35.7(L)  Platelets 150 - 400 K/uL 173     Observation/objective: Appears in no acute distress over video visit today.  Breathing is nonlabored  Assessment and plan: Patient is a 54 year old female and this is a video visit for following issues:  1.  Elevated alkaline phosphatase: Levels over the last 1 year have now been normal.  She did have a bone scan in September 2020 which showed some uptakeIn the left fifth rib possibly secondary to trauma.  I will plan to get a repeat bone scan at this time to confirm stability of findings  2.  History of iron deficiency secondary to gastric bypass: Patient is presentlyMildly anemic but iron studies are currently normal.  She does require IV iron at this time.  For her B12 deficiency currently patient is on B12 shots and most recent levels were normal at 339.  Follow-up instructions: I will see her back in 6 months with CBC ferritin and iron studies  I discussed the assessment and treatment plan with the patient. The patient was provided an opportunity to ask questions and all were answered. The patient agreed with the plan and demonstrated an understanding of the instructions.   The patient was advised to call back or seek an in-person evaluation if the symptoms worsen or if the condition fails to improve as anticipated.    Visit Diagnosis: 1. Elevated alkaline phosphatase level   2. Abnormal radionuclide bone scan   3. B12 deficiency   4. Iron deficiency anemia, unspecified iron deficiency  anemia type     Dr. Owens SharkArchana Tinley Rought, MD, MPH [facility] Tel- [phone] 03/21/2020 1:58 PM

## 2020-03-22 ENCOUNTER — Other Ambulatory Visit: Payer: Self-pay | Admitting: Family

## 2020-03-22 DIAGNOSIS — E039 Hypothyroidism, unspecified: Secondary | ICD-10-CM

## 2020-03-22 NOTE — Telephone Encounter (Signed)
Pt called in and stated that no one has called her that she was told she would be hearing from [place]. The pt sent a list of meds that her insurance will pay for. The pt is requesting a call back. Please advise the pt uses [facility] [address].

## 2020-03-26 ENCOUNTER — Other Ambulatory Visit: Payer: Self-pay | Admitting: Certified Nurse Midwife

## 2020-03-26 MED ORDER — ESTRADIOL-NORETHINDRONE ACET 0.5-0.1 MG PO TABS: 1.0000 | ORAL_TABLET | Freq: Every day | ORAL | 11 refills | Status: DC

## 2020-03-26 NOTE — Progress Notes (Signed)
prescription premrpo not covered by her insurance, changed to mimvey.   Doreene Burke, CNM

## 2020-03-28 ENCOUNTER — Encounter
Admission: RE | Admit: 2020-03-28 | Discharge: 2020-03-28 | Disposition: A | Discharge status: Home / Self Care | Payer: BC Managed Care – PPO | Source: Ambulatory Visit | Attending: Oncology | Admitting: Oncology

## 2020-03-28 ENCOUNTER — Other Ambulatory Visit: Payer: Self-pay

## 2020-03-28 DIAGNOSIS — R948 Abnormal results of function studies of other organs and systems: Secondary | ICD-10-CM | POA: Diagnosis not present

## 2020-03-28 DIAGNOSIS — M170 Bilateral primary osteoarthritis of knee: Secondary | ICD-10-CM | POA: Diagnosis not present

## 2020-03-28 DIAGNOSIS — M19012 Primary osteoarthritis, left shoulder: Secondary | ICD-10-CM | POA: Diagnosis not present

## 2020-03-28 MED ORDER — TECHNETIUM TC 99M MEDRONATE IV KIT
23.2500 | PACK | Freq: Once | INTRAVENOUS | Status: AC | PRN
  Administered 2020-03-28: 23.25 via INTRAVENOUS

## 2020-03-29 ENCOUNTER — Ambulatory Visit (INDEPENDENT_AMBULATORY_CARE_PROVIDER_SITE_OTHER): Payer: BC Managed Care – PPO

## 2020-03-29 DIAGNOSIS — E538 Deficiency of other specified B group vitamins: Secondary | ICD-10-CM

## 2020-03-29 MED ORDER — CYANOCOBALAMIN 1000 MCG/ML IJ SOLN
1000.0000 ug | Freq: Once | INTRAMUSCULAR | Status: AC
  Administered 2020-03-29: 1000 ug via INTRAMUSCULAR

## 2020-03-29 NOTE — Progress Notes (Signed)
Patient presented for B 12 injection to right deltoid, patient voiced no concerns nor showed any signs of distress during injection. 

## 2020-04-05 ENCOUNTER — Ambulatory Visit: Payer: BC Managed Care – PPO | Admitting: Neurology

## 2020-04-06 ENCOUNTER — Ambulatory Visit: Payer: BC Managed Care – PPO | Admitting: Family

## 2020-04-06 ENCOUNTER — Institutional Professional Consult (permissible substitution): Payer: BC Managed Care – PPO | Admitting: Pulmonary Disease

## 2020-04-09 DIAGNOSIS — M533 Sacrococcygeal disorders, not elsewhere classified: Secondary | ICD-10-CM | POA: Diagnosis not present

## 2020-04-09 DIAGNOSIS — M792 Neuralgia and neuritis, unspecified: Secondary | ICD-10-CM | POA: Diagnosis not present

## 2020-04-20 ENCOUNTER — Encounter: Payer: Self-pay | Admitting: Family

## 2020-04-20 ENCOUNTER — Other Ambulatory Visit: Payer: Self-pay

## 2020-04-20 ENCOUNTER — Ambulatory Visit (INDEPENDENT_AMBULATORY_CARE_PROVIDER_SITE_OTHER): Payer: BC Managed Care – PPO | Admitting: Family

## 2020-04-20 DIAGNOSIS — R5382 Chronic fatigue, unspecified: Secondary | ICD-10-CM

## 2020-04-20 DIAGNOSIS — F418 Other specified anxiety disorders: Secondary | ICD-10-CM

## 2020-04-20 DIAGNOSIS — R110 Nausea: Secondary | ICD-10-CM

## 2020-04-20 DIAGNOSIS — G8929 Other chronic pain: Secondary | ICD-10-CM

## 2020-04-20 NOTE — Progress Notes (Signed)
Subjective:    Patient ID: [name], female    DOB: 1966/07/18, 54 y.o.   MRN: [id number]  CC: [name] is a 54 y.o. female who presents today for follow up.   HPI: Fatigue has improved slightly.  She been compliant with B12 and also iron.  Vaginal bleeding- resolved; Saw annie thompson CMN.  Vasomotor symptoms- has since start HRT. feels well and no further hot flashes.   Nausea- improved on prilosec prn. She will take for a couple of days and then stop. Nausea is not bothersome at this time.   pernicious anemia-compliant with b12. Consult with Dr Servando Snare this month.  Insomnia- sleeping well on trazodone. Feels well on regimen    Dr [name] 03/20/20 for elevated alkaline phosphatase.PET scan stable increase uptate left humerus r/t prior trauma     HISTORY:  Past Medical History:  Diagnosis Date  . Anemia   . Anxiety and depression   . Chronic back pain   . DM2 (diabetes mellitus, type 2) (HCC)    improved since gastric bypass  . Headache    stress/sinus - 2-3x/wk  . History of left shoulder fracture   . HLD (hyperlipidemia)   . HTN (hypertension)    improved since gastric bypass  . Hypothyroid   . Left leg weakness    s/p back surgery  . Migraines   . Obesity   . Pernicious anemia   . S/P insertion of spinal cord stimulator   . Wears contact lenses    Past Surgical History:  Procedure Laterality Date  . Abd [place] - gallstones  5/03  . BACK SURGERY  2009   decompressive laminectomy  . BARIATRIC SURGERY  03/02/2012   gastric bypass; Dr Smitty Cords  . CHOLECYSTECTOMY  6/03  . COLONOSCOPY WITH PROPOFOL N/A 05/01/2017   Procedure: COLONOSCOPY WITH PROPOFOL;  Surgeon: Midge Minium, MD;  Location: [facility];  Service: Gastroenterology;  Laterality: N/A;  . DILATION AND CURETTAGE OF UTERUS  1/05  . ESOPHAGOGASTRODUODENOSCOPY (EGD) WITH PROPOFOL N/A 05/01/2017   Procedure: ESOPHAGOGASTRODUODENOSCOPY (EGD) WITH PROPOFOL;  Surgeon: Midge Minium, MD;   Location: [facility];  Service: Gastroenterology;  Laterality: N/A;  . KNEE SURGERY     L - arthroscopic  . LIVER BIOPSY  6/03   fatty liver  . LUMBAR SPINE SURGERY  2015   Dr. Amalia Greenhouse  . NASAL SINUS SURGERY    . PANNICULECTOMY  08/2013   [facility], Dr. Diamond Nickel  . SHOULDER ARTHROSCOPY Left 10/06/2019  . sleep study - apnea  04/2001   improved since gastric bypass   Family History  Problem Relation Age of Onset  . Lung cancer Father   . Diabetes Mother   . Heart failure Mother   . Diabetes Sister   . Fibromyalgia Sister   . Colon cancer Maternal Aunt   . Skin cancer Paternal Aunt   . Skin cancer Paternal Uncle     Allergies: Latex and Penicillins Current Outpatient Medications on File Prior to Visit  Medication Sig Dispense Refill  . DULoxetine (CYMBALTA) 60 MG capsule Take 60 mg by mouth daily.    Dorise Hiss (AIMOVIG) 70 MG/ML SOAJ Inject 70 mg into the skin every 28 (twenty-eight) days. 1 pen 11  . Estradiol-Norethindrone Acet 0.5-0.1 MG tablet Take 1 tablet by mouth daily. 30 tablet 11  . famotidine (PEPCID) 20 MG tablet TAKE 1 TABLET BY MOUTH TWICE A DAY 60 tablet 1  . fentaNYL (DURAGESIC - DOSED MCG/HR) 50 MCG/HR  APPLY 1 PATCH(ES) EVERY 72 HOURS BY TRANSDERMAL ROUTE.  0  . folic acid (FOLVITE) 1 MG tablet Take 1 tablet (1 mg total) by mouth daily. 90 tablet 3  . gabapentin (NEURONTIN) 800 MG tablet Take 800 mg by mouth 3 (three) times daily.    Marland Kitchen levothyroxine (SYNTHROID) 100 MCG tablet TAKE 1 TABLET BY MOUTH EVERY DAY 30 tablet 7  . Magnesium Citrate 200 MG TABS Take 400 mg by mouth daily. 120 tablet 1  . metaxalone (SKELAXIN) 800 MG tablet Take 800 mg by mouth 3 (three) times daily.    . naloxone (NARCAN) nasal spray 4 mg/0.1 mL Place 1 spray into the nose as needed (in case of too much pain patch in her system).    Marland Kitchen omeprazole (PRILOSEC) 20 MG capsule Take 1 capsule (20 mg total) by mouth daily. 30 capsule 3  . oxyCODONE-acetaminophen (PERCOCET/ROXICET) 5-325  MG tablet Take 1 tablet by mouth 2 (two) times daily as needed.    Marland Kitchen PREVIDENT 5000 DRY MOUTH 1.1 % GEL dental gel 3 (three) times daily.    . promethazine (PHENERGAN) 12.5 MG tablet Take 12.5 mg by mouth 4 (four) times daily as needed.    . SODIUM FLUORIDE 5000 PPM 1.1 % PSTE 3 (three) times daily.    . traZODone (DESYREL) 100 MG tablet Take 1 tablet (100 mg total) by mouth at bedtime. 90 tablet 1   No current facility-administered medications on file prior to visit.    Social History   Tobacco Use  . Smoking status: Never Smoker  . Smokeless tobacco: Never Used  . Tobacco comment: tobacco use - no  Vaping Use  . Vaping Use: Never used  Substance Use Topics  . Alcohol use: No  . Drug use: No    Review of Systems  Constitutional: Positive for fatigue. Negative for chills, fever and unexpected weight change.  Respiratory: Negative for cough.   Cardiovascular: Negative for chest pain and palpitations.  Gastrointestinal: Negative for nausea and vomiting.      Objective:    BP 114/64 (BP Location: Left Arm, Patient Position: Sitting, Cuff Size: Large)   Pulse 71   Temp 98.3 F (36.8 C) (Oral)   Resp 15   Ht 5\' 4"  (1.626 m)   Wt 218 lb 12.8 oz (99.2 kg)   LMP 11/28/2019 (Approximate)   SpO2 95%   BMI 37.56 kg/m  BP Readings from Last 3 Encounters:  04/20/20 114/64  03/13/20 (!) 117/97  03/06/20 (!) 144/68   Wt Readings from Last 3 Encounters:  04/20/20 218 lb 12.8 oz (99.2 kg)  03/13/20 215 lb (97.5 kg)  03/06/20 216 lb (98 kg)    Physical Exam Vitals reviewed.  Constitutional:      Appearance: She is well-developed.  Eyes:     Conjunctiva/sclera: Conjunctivae normal.  Cardiovascular:     Rate and Rhythm: Normal rate and regular rhythm.     Pulses: Normal pulses.     Heart sounds: Normal heart sounds.  Pulmonary:     Effort: Pulmonary effort is normal.     Breath sounds: Normal breath sounds. No wheezing, rhonchi or rales.  Skin:    General: Skin is warm  and dry.  Neurological:     Mental Status: She is alert.  Psychiatric:        Speech: Speech normal.        Behavior: Behavior normal.        Thought Content: Thought content normal.  Assessment & Plan:   Problem List Items Addressed This Visit      Other   Chronic pain   Depression with anxiety    Feels well with current regimen, Cymbalta and trazodone.  We will continue      Fatigue    Appears to have slightly improved.  Patient will continue iron, B12.  Continue to encourage exercise.  At this time, patient politely declines  further work-up of fatigue.  Will follow      Nausea    Improved on prn prilosec.  Patient declines any further work-up for nausea at this time.  Will follow          I have discontinued Prachi C. Hevener's ondansetron. I am also having her maintain her fentaNYL, DULoxetine, naloxone, traZODone, Magnesium Citrate, metaxalone, oxyCODONE-acetaminophen, promethazine, PreviDent 5000 Dry Mouth, Sodium Fluoride 5000 PPM, gabapentin, omeprazole, folic acid, famotidine, Aimovig, levothyroxine, and Estradiol-Norethindrone Acet.   No orders of the defined types were placed in this encounter.   Return precautions given.   Risks, benefits, and alternatives of the medications and treatment plan prescribed today were discussed, and patient expressed understanding.   Education regarding symptom management and diagnosis given to patient on AVS.  Continue to follow with Allegra Grana, FNP for routine health maintenance.   Benjamin Stain and I agreed with plan.   Rennie Plowman, FNP

## 2020-04-20 NOTE — Assessment & Plan Note (Signed)
Improved on prn prilosec.  Patient declines any further work-up for nausea at this time.  Will follow

## 2020-04-20 NOTE — Patient Instructions (Signed)
Nice to see you!   

## 2020-04-20 NOTE — Assessment & Plan Note (Signed)
Feels well with current regimen, Cymbalta and trazodone.  We will continue

## 2020-04-20 NOTE — Assessment & Plan Note (Signed)
Appears to have slightly improved.  Patient will continue iron, B12.  Continue to encourage exercise.  At this time, patient politely declines  further work-up of fatigue.  Will follow

## 2020-04-30 DIAGNOSIS — M25512 Pain in left shoulder: Secondary | ICD-10-CM | POA: Diagnosis not present

## 2020-05-02 ENCOUNTER — Ambulatory Visit (INDEPENDENT_AMBULATORY_CARE_PROVIDER_SITE_OTHER): Payer: BC Managed Care – PPO

## 2020-05-02 ENCOUNTER — Other Ambulatory Visit: Payer: Self-pay

## 2020-05-02 DIAGNOSIS — E538 Deficiency of other specified B group vitamins: Secondary | ICD-10-CM | POA: Diagnosis not present

## 2020-05-02 MED ORDER — CYANOCOBALAMIN 1000 MCG/ML IJ SOLN
1000.0000 ug | Freq: Once | INTRAMUSCULAR | Status: AC
  Administered 2020-05-02: 1000 ug via INTRAMUSCULAR

## 2020-05-02 NOTE — Progress Notes (Signed)
Patient presented for B 12 injection to right deltoid, patient voiced no concerns nor showed any signs of distress during injection. 

## 2020-05-03 DIAGNOSIS — Z79891 Long term (current) use of opiate analgesic: Secondary | ICD-10-CM | POA: Diagnosis not present

## 2020-05-03 DIAGNOSIS — G8929 Other chronic pain: Secondary | ICD-10-CM | POA: Diagnosis not present

## 2020-05-03 DIAGNOSIS — M533 Sacrococcygeal disorders, not elsewhere classified: Secondary | ICD-10-CM | POA: Diagnosis not present

## 2020-05-03 DIAGNOSIS — M5126 Other intervertebral disc displacement, lumbar region: Secondary | ICD-10-CM | POA: Diagnosis not present

## 2020-05-03 DIAGNOSIS — M5137 Other intervertebral disc degeneration, lumbosacral region: Secondary | ICD-10-CM | POA: Diagnosis not present

## 2020-05-09 ENCOUNTER — Other Ambulatory Visit: Payer: Self-pay

## 2020-05-09 ENCOUNTER — Other Ambulatory Visit
Admission: RE | Admit: 2020-05-09 | Discharge: 2020-05-09 | Disposition: A | Discharge status: Home / Self Care | Payer: BC Managed Care – PPO | Attending: Gastroenterology | Admitting: Gastroenterology

## 2020-05-09 ENCOUNTER — Ambulatory Visit (INDEPENDENT_AMBULATORY_CARE_PROVIDER_SITE_OTHER): Payer: BC Managed Care – PPO | Admitting: Gastroenterology

## 2020-05-09 VITALS — BP 116/78 | HR 98 | Temp 96.1°F | Ht 64.5 in | Wt 224.6 lb

## 2020-05-09 DIAGNOSIS — D510 Vitamin B12 deficiency anemia due to intrinsic factor deficiency: Secondary | ICD-10-CM | POA: Diagnosis not present

## 2020-05-09 LAB — CBC
HCT: 36.9 % (ref 36.0–46.0)
Hemoglobin: 11.6 g/dL — ABNORMAL LOW (ref 12.0–15.0)
MCH: 30.9 pg (ref 26.0–34.0)
MCHC: 31.4 g/dL (ref 30.0–36.0)
MCV: 98.1 fL (ref 80.0–100.0)
Platelets: 195 10*3/uL (ref 150–400)
RBC: 3.76 MIL/uL — ABNORMAL LOW (ref 3.87–5.11)
RDW: 13.2 % (ref 11.5–15.5)
WBC: 4.7 10*3/uL (ref 4.0–10.5)
nRBC: 0 % (ref 0.0–0.2)

## 2020-05-09 LAB — FERRITIN: Ferritin: 69 ng/mL (ref 11–307)

## 2020-05-09 LAB — IRON AND TIBC
Iron: 85 ug/dL (ref 28–170)
Saturation Ratios: 26 % (ref 10.4–31.8)
TIBC: 333 ug/dL (ref 250–450)
UIBC: 248 ug/dL

## 2020-05-09 LAB — LACTATE DEHYDROGENASE: LDH: 149 U/L (ref 98–192)

## 2020-05-09 NOTE — Progress Notes (Signed)
Primary Care Physician: Allegra Grana, FNP  Primary Gastroenterologist:  Dr. Midge Minium  Chief Complaint  Patient presents with  . Anemia    HPI: [name] is a 54 y.o. female here for follow-up.  The patient has a history of fatty liver disease and was evaluated originally for abnormal liver enzymes.  The patient's liver enzymes have come back to normal as of 1 month ago.  The patient also has had iron deficiency anemia diagnosed in the past with her hemoglobin being normal 3 months ago at 13.1 and then a repeat 1 month ago was low at 11.8.  The patient had normal iron studies in July of this year.  The patient's folate level was low and she was given supplementation.  The patient has also been getting B12 with a normal B12 blood level but it was noted that it was on the lower and of normal.  The patient had her intrinsic factor antibodies checked 3 years ago and it was negative but now turned positive 1 month ago.  She is now being sent to me for evaluation of pernicious anemia.  The patient has been states that she feels tired.  She has been getting B12 injections.  The patient has had gastric bypass surgery and has had iron deficiency anemia in the past.  Patient's iron studies have been normal although her saturation is on the low side.  The patient has had a ulcer found on her upper endoscopy in the past.  She denies any abdominal pain nausea vomiting fevers or chills.  The patient actually states that she is gaining weight.  Past Medical History:  Diagnosis Date  . Anemia   . Anxiety and depression   . Chronic back pain   . DM2 (diabetes mellitus, type 2) (HCC)    improved since gastric bypass  . Headache    stress/sinus - 2-3x/wk  . History of left shoulder fracture   . HLD (hyperlipidemia)   . HTN (hypertension)    improved since gastric bypass  . Hypothyroid   . Left leg weakness    s/p back surgery  . Migraines   . Obesity   . Pernicious anemia   . S/P  insertion of spinal cord stimulator   . Wears contact lenses     Current Outpatient Medications  Medication Sig Dispense Refill  . DULoxetine (CYMBALTA) 60 MG capsule Take 60 mg by mouth daily.    Dorise Hiss (AIMOVIG) 70 MG/ML SOAJ Inject 70 mg into the skin every 28 (twenty-eight) days. 1 pen 11  . Estradiol-Norethindrone Acet 0.5-0.1 MG tablet Take 1 tablet by mouth daily. 30 tablet 11  . famotidine (PEPCID) 20 MG tablet TAKE 1 TABLET BY MOUTH TWICE A DAY 60 tablet 1  . fentaNYL (DURAGESIC - DOSED MCG/HR) 50 MCG/HR APPLY 1 PATCH(ES) EVERY 72 HOURS BY TRANSDERMAL ROUTE.  0  . folic acid (FOLVITE) 1 MG tablet Take 1 tablet (1 mg total) by mouth daily. 90 tablet 3  . gabapentin (NEURONTIN) 800 MG tablet Take 800 mg by mouth 3 (three) times daily.    Marland Kitchen levothyroxine (SYNTHROID) 100 MCG tablet TAKE 1 TABLET BY MOUTH EVERY DAY 30 tablet 7  . Magnesium Citrate 200 MG TABS Take 400 mg by mouth daily. 120 tablet 1  . metaxalone (SKELAXIN) 800 MG tablet Take 800 mg by mouth 3 (three) times daily.    . naloxone (NARCAN) nasal spray 4 mg/0.1 mL Place 1 spray into the nose as  needed (in case of too much pain patch in her system).    Marland Kitchen omeprazole (PRILOSEC) 20 MG capsule Take 1 capsule (20 mg total) by mouth daily. 30 capsule 3  . oxyCODONE-acetaminophen (PERCOCET/ROXICET) 5-325 MG tablet Take 1 tablet by mouth 2 (two) times daily as needed.    Marland Kitchen PREVIDENT 5000 DRY MOUTH 1.1 % GEL dental gel 3 (three) times daily.    . promethazine (PHENERGAN) 12.5 MG tablet Take 12.5 mg by mouth 4 (four) times daily as needed.    . SODIUM FLUORIDE 5000 PPM 1.1 % PSTE 3 (three) times daily.    . traZODone (DESYREL) 100 MG tablet Take 1 tablet (100 mg total) by mouth at bedtime. 90 tablet 1   No current facility-administered medications for this visit.    Allergies as of 05/09/2020 - Review Complete 05/09/2020  Allergen Reaction Noted  . Latex Rash 03/31/2018  . Penicillins Itching and Swelling 12/28/2007     ROS:  General: Negative for anorexia, weight loss, fever, chills, fatigue, weakness. ENT: Negative for hoarseness, difficulty swallowing , nasal congestion. CV: Negative for chest pain, angina, palpitations, dyspnea on exertion, peripheral edema.  Respiratory: Negative for dyspnea at rest, dyspnea on exertion, cough, sputum, wheezing.  GI: See history of present illness. GU:  Negative for dysuria, hematuria, urinary incontinence, urinary frequency, nocturnal urination.  Endo: Negative for unusual weight change.    Physical Examination:   BP 116/78   Pulse 98   Temp (!) 96.1 F (35.6 C) (Temporal)   Ht 5' 4.5" (1.638 m)   Wt 224 lb 9.6 oz (101.9 kg)   LMP 11/28/2019 (Approximate)   BMI 37.96 kg/m   General: Well-nourished, well-developed in no acute distress.  Eyes: No icterus. Conjunctivae pink. Lungs: Clear to auscultation bilaterally. Non-labored. Heart: Regular rate and rhythm, no murmurs rubs or gallops.  Abdomen: Bowel sounds are normal, nontender, nondistended, no hepatosplenomegaly or masses, no abdominal bruits or hernia , no rebound or guarding.   Extremities: No lower extremity edema. No clubbing or deformities. Neuro: Alert and oriented x 3.  Grossly intact. Skin: Warm and dry, no jaundice.   Psych: Alert and cooperative, normal mood and affect.  Labs:    Imaging Studies: No results found.  Assessment and Plan:   [name] is a 54 y.o. y/o female who comes in today with a questionable diagnosis of pernicious anemia with positive intrinsic factor antibodies.  The patient will have her blood sent off for homocysteine, methylmalonic acid, repeat iron studies antiparietal cell antibodies and LDH which would support the diagnosis of pernicious anemia.  The patient has been getting B12 shots which do not need the intrinsic factor of the stomach to get absorbed so therefore should not be at the lower limit of normal due to the bypassing the GI  tract.    Midge Minium, MD. Clementeen Graham    Note: This dictation was prepared with Dragon dictation along with smaller phrase technology. Any transcriptional errors that result from this process are unintentional.

## 2020-05-10 ENCOUNTER — Encounter: Payer: Self-pay | Admitting: Gastroenterology

## 2020-05-10 LAB — INTRINSIC FACTOR ANTIBODIES: Intrinsic Factor: 1 AU/mL (ref 0.0–1.1)

## 2020-05-10 LAB — HOMOCYSTEINE: Homocysteine: 12.5 umol/L (ref 0.0–14.5)

## 2020-05-10 LAB — ANTI-PARIETAL ANTIBODY: Parietal Cell Antibody-IgG: 1.4 Units (ref 0.0–20.0)

## 2020-05-12 LAB — METHYLMALONIC ACID, SERUM: Methylmalonic Acid, Quantitative: 210 nmol/L (ref 0–378)

## 2020-05-14 ENCOUNTER — Telehealth: Payer: Self-pay

## 2020-05-14 ENCOUNTER — Other Ambulatory Visit: Payer: Self-pay | Admitting: Family

## 2020-05-14 DIAGNOSIS — F418 Other specified anxiety disorders: Secondary | ICD-10-CM

## 2020-05-14 DIAGNOSIS — G4700 Insomnia, unspecified: Secondary | ICD-10-CM

## 2020-05-14 NOTE — Telephone Encounter (Signed)
-----   Message from Midge Minium, MD sent at 05/14/2020 11:51 AM EDT ----- Let the patient know that all of her tests were negative for pernicious anemia.  Even the intrinsic factor which was positive previously is now negative.  I cannot explain why this would go up or down but all of the other labs indicate that this is not the issue.  Either way the treatment of this would be B12 injections and she is already receiving these.

## 2020-05-14 NOTE — Telephone Encounter (Signed)
Pt notified of lab results

## 2020-05-31 ENCOUNTER — Telehealth: Payer: Self-pay | Admitting: Family

## 2020-05-31 NOTE — Telephone Encounter (Signed)
Rejection Reason - Patient did not respond" [facility] said 24 minutes ago  3rd attempt to contact pt. LVM. Closing referral due to protocol./TM

## 2020-06-06 ENCOUNTER — Other Ambulatory Visit: Payer: Self-pay

## 2020-06-06 ENCOUNTER — Encounter: Payer: Self-pay | Admitting: Family

## 2020-06-06 ENCOUNTER — Ambulatory Visit (INDEPENDENT_AMBULATORY_CARE_PROVIDER_SITE_OTHER): Payer: BC Managed Care – PPO

## 2020-06-06 DIAGNOSIS — E538 Deficiency of other specified B group vitamins: Secondary | ICD-10-CM

## 2020-06-06 MED ORDER — CYANOCOBALAMIN 1000 MCG/ML IJ SOLN
1000.0000 ug | Freq: Once | INTRAMUSCULAR | Status: AC
  Administered 2020-06-06: 1000 ug via INTRAMUSCULAR

## 2020-06-06 NOTE — Progress Notes (Signed)
Patient presented for B 12 injection to right deltoid, patient voiced no concerns nor showed any signs of distress during injection.  Patient also stated she will need to start giving these herself since she now has a job. Patient has been giving herself shots for years. Please advise.

## 2020-06-07 MED ORDER — "BD DISP NEEDLE 25G X 1"" MISC": 5 refills | Status: DC

## 2020-06-07 MED ORDER — "SYRINGE/NEEDLE (DISP) 25G X 1"" 3 ML MISC": 5 refills | Status: DC

## 2020-06-07 MED ORDER — CYANOCOBALAMIN 1000 MCG/ML IJ SOLN: 1000.0000 ug | Freq: Once | INTRAMUSCULAR | 3 refills | Status: DC

## 2020-06-08 ENCOUNTER — Other Ambulatory Visit: Payer: Self-pay | Admitting: Family

## 2020-06-08 MED ORDER — PROPRANOLOL HCL 20 MG PO TABS: 20.0000 mg | ORAL_TABLET | Freq: Two times a day (BID) | ORAL | 0 refills | Status: DC

## 2020-06-08 NOTE — Telephone Encounter (Signed)
I called and spoke with patient & she stated that she was once on propanolol, but was taken off. She stated that she would try again, but we wanted to make sure what does she currently had. I asked patient to mychart or call back to let us know. Once I hear I will send in propanolol 20mg  dose for patient to take every other day BID.

## 2020-06-08 NOTE — Progress Notes (Signed)
I called and spoke with patient & she stated that she was once on propanolol, but was taken off. She stated that she would try again, but we wanted to make sure what does she currently had. I asked patient to mychart or call back to let us know. Once I hear I will send in propanolol 20mg dose for patient to take every other day BID. 

## 2020-06-08 NOTE — Progress Notes (Signed)
I sent patient mychart message letting her know that I did send in propanolol, but let us still know what does she had at home before taking.

## 2020-06-11 DIAGNOSIS — M533 Sacrococcygeal disorders, not elsewhere classified: Secondary | ICD-10-CM | POA: Diagnosis not present

## 2020-06-11 DIAGNOSIS — E039 Hypothyroidism, unspecified: Secondary | ICD-10-CM | POA: Diagnosis not present

## 2020-06-11 DIAGNOSIS — D649 Anemia, unspecified: Secondary | ICD-10-CM | POA: Diagnosis not present

## 2020-06-27 ENCOUNTER — Encounter: Payer: Self-pay | Admitting: Family

## 2020-06-27 DIAGNOSIS — E538 Deficiency of other specified B group vitamins: Secondary | ICD-10-CM | POA: Insufficient documentation

## 2020-07-12 ENCOUNTER — Other Ambulatory Visit: Payer: Self-pay

## 2020-07-12 ENCOUNTER — Telehealth (INDEPENDENT_AMBULATORY_CARE_PROVIDER_SITE_OTHER): Payer: BC Managed Care – PPO | Admitting: Family

## 2020-07-12 ENCOUNTER — Telehealth: Payer: Self-pay | Admitting: Family

## 2020-07-12 ENCOUNTER — Encounter: Payer: Self-pay | Admitting: Family

## 2020-07-12 VITALS — Ht 64.5 in | Wt 224.0 lb

## 2020-07-12 DIAGNOSIS — J309 Allergic rhinitis, unspecified: Secondary | ICD-10-CM

## 2020-07-12 DIAGNOSIS — F418 Other specified anxiety disorders: Secondary | ICD-10-CM

## 2020-07-12 MED ORDER — LORATADINE 10 MG PO TABS: 10.0000 mg | ORAL_TABLET | Freq: Every day | ORAL | 11 refills | Status: DC

## 2020-07-12 MED ORDER — DULOXETINE HCL 30 MG PO CPEP: 30.0000 mg | ORAL_CAPSULE | Freq: Every day | ORAL | 0 refills | Status: DC

## 2020-07-12 MED ORDER — AZELASTINE HCL 0.1 % NA SOLN: 1.0000 | Freq: Two times a day (BID) | NASAL | 4 refills | Status: DC

## 2020-07-12 MED ORDER — FLUOXETINE HCL 20 MG PO CAPS: 20.0000 mg | ORAL_CAPSULE | Freq: Every morning | ORAL | 3 refills | Status: DC

## 2020-07-12 NOTE — Progress Notes (Signed)
Virtual Visit via Video Note  I connected with@  on 07/18/20 at  4:00 PM EDT by a video enabled telemedicine application and verified that I am speaking with the correct person using two identifiers.  Location patient: home Location provider:work  Persons participating in the virtual visit: patient, provider  I discussed the limitations of evaluation and management by telemedicine and the availability of in person appointments. The patient expressed understanding and agreed to proceed.   HPI:  Today is her sister's birthday whom passed away 2 months ago. Tearful. Worsening depression. No anxiety.  Remains very anxious. Sleep 'is okay'. She isnt sure what propranolol is doing for her.  Confides in preacher.  Has tried wellbutrin.Has also been celexa, prozac. No h/o anorexia, bulimia. Patient was told she had a seizure as child, however she is not sure. Her mother states she did not have a seizure.  No longer on tramadol.  Work is going well No si/hi.    She also complains of runny nose, itchy watery eyes, ear pain for past 3 weeks ago, unchanged.   Nose running constantly, clear.   Has been using nasocort, otc [facility] brand for 'runny nose'.  No cough, fever, sob, loss of taste or smell  No known covid contacts.   Covid vaccinated   ROS: See pertinent positives and negatives per HPI.    EXAM:  VITALS per patient if applicable: Ht 5' 4.5" (1.638 m)   Wt 224 lb (101.6 kg)   LMP 11/28/2019 (Approximate)   BMI 37.86 kg/m  BP Readings from Last 3 Encounters:  05/09/20 116/78  04/20/20 114/64  03/13/20 (!) 117/97   Wt Readings from Last 3 Encounters:  07/12/20 224 lb (101.6 kg)  05/09/20 224 lb 9.6 oz (101.9 kg)  04/20/20 218 lb 12.8 oz (99.2 kg)    GENERAL: alert, oriented, appears well and in no acute distress  HEENT: atraumatic, conjunttiva clear, no obvious abnormalities on inspection of external nose and ears  NECK: normal movements of the head and  neck  LUNGS: on inspection no signs of respiratory distress, breathing rate appears normal, no obvious gross SOB, gasping or wheezing  CV: no obvious cyanosis  MS: moves all visible extremities without noticeable abnormality  PSYCH/NEURO: pleasant and cooperative, no obvious depression or anxiety, speech and thought processing grossly intact  ASSESSMENT AND PLAN:  Discussed the following assessment and plan:  Problem List Items Addressed This Visit      Respiratory   Allergic rhinitis - Primary    Appearance of chronic. She declines covid test. We will treat conservatively with claritin 10mg  and aztelin nasal spray. If not improvement , she will let me know.      Relevant Medications   azelastine (ASTELIN) 0.1 % nasal spray   loratadine (CLARITIN) 10 MG tablet     Other   Depression with anxiety    Worsening with grief reaction. We agreed she needed more activating agent. Due to uncertainly around seizure as a child, we decided not to use welllbutrin.She will wean off cymbalta, cross taper, and start prozac 20mg . Propranolol not helpful so we have opted to wean off today as concerned it may contributing to fatigue/depression. She declines counseling referral.  Close follow up      Relevant Medications   FLUoxetine (PROZAC) 20 MG capsule   DULoxetine (CYMBALTA) 30 MG capsule      -we discussed possible serious and likely etiologies, options for evaluation and workup, limitations of telemedicine visit vs in person visit,  treatment, treatment risks and precautions. Pt prefers to treat via telemedicine empirically rather then risking or undertaking an in person visit at this moment.  .   I discussed the assessment and treatment plan with the patient. The patient was provided an opportunity to ask questions and all were answered. The patient agreed with the plan and demonstrated an understanding of the instructions.   The patient was advised to call back or seek an in-person  evaluation if the symptoms worsen or if the condition fails to improve as anticipated.  I have spent 21 minutes with a patient including  discussion plan of care regarding depression, anxiety and treatment options.     Rennie Plowman, FNP

## 2020-07-12 NOTE — Patient Instructions (Addendum)
Wean off propranolol by taking 10mg  twice per day for one week, then may stop.   Consider home covid test.   Please start azelastine nasal spray and claritin and let me know if symptoms do not resolve  Please reduce cymbalta to 30mg ; I have sent in this dose. Please take cymbalta 30mg  daily for 2 -3 weeks, and if you feel okay, you may go ahead and stop medication.   Please go ahead and start prozac 20mg  daily.   Call me with any questions.

## 2020-07-13 ENCOUNTER — Telehealth: Payer: Self-pay

## 2020-07-13 NOTE — Telephone Encounter (Signed)
lvm to schedule 6 week follow up

## 2020-07-16 NOTE — Telephone Encounter (Signed)
close

## 2020-07-18 ENCOUNTER — Encounter: Payer: Self-pay | Admitting: Family

## 2020-07-18 NOTE — Assessment & Plan Note (Signed)
Appearance of chronic. She declines covid test. We will treat conservatively with claritin 10mg  and aztelin nasal spray. If not improvement , she will let me know.

## 2020-07-18 NOTE — Assessment & Plan Note (Addendum)
Worsening with grief reaction. We agreed she needed more activating agent. Due to uncertainly around seizure as a child, we decided not to use welllbutrin.She will wean off cymbalta, cross taper, and start prozac 20mg . Propranolol not helpful so we have opted to wean off today as concerned it may contributing to fatigue/depression. She declines counseling referral.  Close follow up

## 2020-07-23 NOTE — Progress Notes (Deleted)
NEUROLOGY FOLLOW UP OFFICE NOTE  [name] [id number]  HISTORY OF PRESENT ILLNESS: [name] is a 54 year old right-handed Caucasian female with type 2 diabetes, chronic back pain, depression and anxiety and history of gastric bypass surgery who follows up for migraines.  UPDATE: Started Aimovig in June. Intensity:  *** Duration:  *** Frequency:  *** Frequency of abortive medication: *** Current NSAIDS:  none Current analgesics:  Fentanyl patch (chronic back pain) Percocet (back pain), Excedrin, Tylenol Current triptans:  none Current ergotamine:  none Current anti-emetic:  Zofran ODT 4mg , promethazine 12.5mg  Current muscle relaxants:  Skelaxin Current anti-anxiolytic:  none Current sleep aide:  trazodone Current Antihypertensive medications:  none Current Antidepressant medications:  Cymbalta 60mg  daily, trazodone Current Anticonvulsant medications:  Gabapentin 800mg  TID Current anti-CGRP:  Aimovig 70mg  Current Vitamins/Herbal/Supplements:  Magnesium citrate 400mg  Current Antihistamines/Decongestants:  none Other therapy:  none Hormone/birth control:  none  Caffeine:  Rarely drinks coffee.  No caffeinated soda. Diet:  Drinks a lot of water.  Drinks protein drink for breakfast and lunch.  Sometimes has decreased appetite since gastric bypass in 2013. Exercise:  Not routine (limited due to back pain) Depression: yes; Anxiety:  no Other pain:  Chronic back pain.  Sometimes neck pain. Sleep:  Good (due to medications)  HISTORY: Onset:   After a fall in April 2020, hitting her head.  Location:  Often starts in the temples and may move across forehead or back of head Quality:  Starts as pressure and progresses to a sharp pain Intensity:  Usually moderate, sometimes severe Aura:  none Premonitory Phase:  none Postdrome:  none Associated symptoms:  Occasional nausea, photophobia, phonophobia.  She denies associated visual disturbance, unilateral numbness  or weakness. Duration:  30 to 45 minutes Frequency:  Daily, sometimes more than once a day Frequency of abortive medication: 5 out of 7 days a week.  Excedrin at least twice a week, otherwise may take Tylenol Triggers:  no Relieving factors:  Excedrin Activity:  Aggravates  Previous history of different headaches, migraines that would put her in bed, with nausea, photophobia, phonophobia   Past NSAIDS:  naproxen Past analgesics:  none Past abortive triptans:  none Past abortive ergotamine:  none Past muscle relaxants:  none Past anti-emetic:  none Past antihypertensive medications:  propranolol Past antidepressant medications:  Venlafaxine; citalopram, fluoxetine, Wellbutrin Past anticonvulsant medications:  Lyrica Past anti-CGRP:  none Past vitamins/Herbal/Supplements:  none Past antihistamines/decongestants:  meclizine Other past therapies:  none   Family history of headache:  No  Imaging (personally reviewed): 01/02/2019 CT HEAD WO:  Stable right frontal encephalomalacia but no acute intracranial abnormality. 09/21/2018 CTA HEAD & NECK:  Negative.  PAST MEDICAL HISTORY: Past Medical History:  Diagnosis Date  . Anemia   . Anxiety and depression   . Chronic back pain   . DM2 (diabetes mellitus, type 2) (HCC)    improved since gastric bypass  . Headache    stress/sinus - 2-3x/wk  . History of left shoulder fracture   . HLD (hyperlipidemia)   . HTN (hypertension)    improved since gastric bypass  . Hypothyroid   . Left leg weakness    s/p back surgery  . Migraines   . Obesity   . Pernicious anemia   . S/P insertion of spinal cord stimulator   . Seizure [facility])    told she had a seizure as young child, not sure of context; mother states she didnt witness  . Wears contact lenses  MEDICATIONS: Current Outpatient Medications on File Prior to Visit  Medication Sig Dispense Refill  . azelastine (ASTELIN) 0.1 % nasal spray Place 1 spray into both nostrils 2  (two) times daily. Use in each nostril as directed 30 mL 4  . DULoxetine (CYMBALTA) 30 MG capsule Take 1 capsule (30 mg total) by mouth daily. 30 capsule 0  . Erenumab-aooe (AIMOVIG) 70 MG/ML SOAJ Inject 70 mg into the skin every 28 (twenty-eight) days. 1 pen 11  . Estradiol-Norethindrone Acet 0.5-0.1 MG tablet Take 1 tablet by mouth daily. 30 tablet 11  . famotidine (PEPCID) 20 MG tablet TAKE 1 TABLET BY MOUTH TWICE A DAY 60 tablet 1  . fentaNYL (DURAGESIC - DOSED MCG/HR) 50 MCG/HR APPLY 1 PATCH(ES) EVERY 72 HOURS BY TRANSDERMAL ROUTE.  0  . FLUoxetine (PROZAC) 20 MG capsule Take 1 capsule (20 mg total) by mouth every morning. 90 capsule 3  . folic acid (FOLVITE) 1 MG tablet Take 1 tablet (1 mg total) by mouth daily. 90 tablet 3  . gabapentin (NEURONTIN) 800 MG tablet Take 800 mg by mouth 3 (three) times daily.    Marland Kitchen levothyroxine (SYNTHROID) 100 MCG tablet TAKE 1 TABLET BY MOUTH EVERY DAY 30 tablet 7  . loratadine (CLARITIN) 10 MG tablet Take 1 tablet (10 mg total) by mouth daily. 30 tablet 11  . Magnesium Citrate 200 MG TABS Take 400 mg by mouth daily. 120 tablet 1  . metaxalone (SKELAXIN) 800 MG tablet Take 800 mg by mouth 3 (three) times daily.    . mometasone (NASONEX) 50 MCG/ACT nasal spray Place 2 sprays into the nose daily.    . naloxone (NARCAN) nasal spray 4 mg/0.1 mL Place 1 spray into the nose as needed (in case of too much pain patch in her system).    Marland Kitchen NEEDLE, DISP, 25 G (B-D DISP NEEDLE 25GX1") 25G X 1" MISC Used to give monlthly B-12 injections. 1 each 5  . omeprazole (PRILOSEC) 20 MG capsule Take 1 capsule (20 mg total) by mouth daily. 30 capsule 3  . oxyCODONE-acetaminophen (PERCOCET/ROXICET) 5-325 MG tablet Take 1 tablet by mouth 2 (two) times daily as needed.    . propranolol (INDERAL) 20 MG tablet Take 1 tablet (20 mg total) by mouth 2 (two) times daily. 120 tablet 0  . SODIUM FLUORIDE 5000 PPM 1.1 % PSTE 3 (three) times daily.    . SYRINGE-NEEDLE, DISP, 3 ML 25G X 1" 3 ML  MISC Used to give monthly B-12 injections. 1 each 5  . traZODone (DESYREL) 100 MG tablet TAKE 1 TABLET BY MOUTH EVERYDAY AT BEDTIME 30 tablet 5   No current facility-administered medications on file prior to visit.    ALLERGIES: Allergies  Allergen Reactions  . Latex Rash  . Penicillins Itching and Swelling    Has patient had a PCN reaction causing immediate rash, facial/tongue/throat swelling, SOB or lightheadedness with hypotension: yes Has patient had a PCN reaction causing severe rash involving mucus membranes or skin necrosis: no Has patient had a PCN reaction that required hospitalization : yes ed visit Has patient had a PCN reaction occurring within the last 10 years: yes If all of the above answers are "NO", then may proceed with Cephalosporin use.     FAMILY HISTORY: Family History  Problem Relation Age of Onset  . Lung cancer Father   . Diabetes Mother   . Heart failure Mother   . Diabetes Sister   . Fibromyalgia Sister   . Colon cancer Maternal Aunt   .  Skin cancer Paternal Aunt   . Skin cancer Paternal Uncle     SOCIAL HISTORY: Social History   Socioeconomic History  . Marital status: Married    Spouse name: Not on file  . Number of children: Not on file  . Years of education: Not on file  . Highest education level: Not on file  Occupational History  . Not on file  Tobacco Use  . Smoking status: Never Smoker  . Smokeless tobacco: Never Used  . Tobacco comment: tobacco use - no  Vaping Use  . Vaping Use: Never used  Substance and Sexual Activity  . Alcohol use: No  . Drug use: No  . Sexual activity: Not Currently    Birth control/protection: None  Other Topics Concern  . Not on file  Social History Narrative   Partner- [name], no children; works in [place], does not get regular exercise.    Right handed   One story home   No caffeine   Social Determinants of Health   Financial Resource Strain:   . Difficulty of Paying Living Expenses: Not on  file  Food Insecurity:   . Worried About Programme researcher, broadcasting/film/video in the Last Year: Not on file  . Ran Out of Food in the Last Year: Not on file  Transportation Needs:   . Lack of Transportation (Medical): Not on file  . Lack of Transportation (Non-Medical): Not on file  Physical Activity:   . Days of Exercise per Week: Not on file  . Minutes of Exercise per Session: Not on file  Stress:   . Feeling of Stress : Not on file  Social Connections:   . Frequency of Communication with Friends and Family: Not on file  . Frequency of Social Gatherings with Friends and Family: Not on file  . Attends Religious Services: Not on file  . Active Member of Clubs or Organizations: Not on file  . Attends Banker Meetings: Not on file  . Marital Status: Not on file  Intimate Partner Violence:   . Fear of Current or Ex-Partner: Not on file  . Emotionally Abused: Not on file  . Physically Abused: Not on file  . Sexually Abused: Not on file   PHYSICAL EXAM: *** General: No acute distress.  Patient appears well-groomed.   Head:  Normocephalic/atraumatic Eyes:  Fundi examined but not visualized Neck: supple, no paraspinal tenderness, full range of motion Heart:  Regular rate and rhythm Lungs:  Clear to auscultation bilaterally Back: No paraspinal tenderness Neurological Exam: alert and oriented to person, place, and time. Attention span and concentration intact, recent and remote memory intact, fund of knowledge intact.  Speech fluent and not dysarthric, language intact.  CN II-XII intact. Bulk and tone normal, muscle strength 5/5 throughout.  Sensation to light touch, temperature and vibration intact.  Deep tendon reflexes 2+ throughout, toes downgoing.  Finger to nose and heel to shin testing intact.  Gait normal, Romberg negative.  IMPRESSION: Chronic migraine without aura  PLAN: ***  Shon Millet, DO  CC: Rennie Plowman, FNP

## 2020-07-24 ENCOUNTER — Ambulatory Visit: Payer: BC Managed Care – PPO | Admitting: Neurology

## 2020-08-01 DIAGNOSIS — M5137 Other intervertebral disc degeneration, lumbosacral region: Secondary | ICD-10-CM | POA: Diagnosis not present

## 2020-08-01 DIAGNOSIS — M5126 Other intervertebral disc displacement, lumbar region: Secondary | ICD-10-CM | POA: Diagnosis not present

## 2020-08-01 DIAGNOSIS — G8929 Other chronic pain: Secondary | ICD-10-CM | POA: Diagnosis not present

## 2020-08-03 ENCOUNTER — Other Ambulatory Visit: Payer: Self-pay | Admitting: Family

## 2020-08-03 DIAGNOSIS — F418 Other specified anxiety disorders: Secondary | ICD-10-CM

## 2020-08-03 NOTE — Telephone Encounter (Signed)
Pt requests a change to medication amount. She wants a 90 day and 3 refill to cymbalta and a 90 day and 4 refill for Prozac. Ok to refill?

## 2020-08-03 NOTE — Telephone Encounter (Signed)
Ok to give Vitamin B12 home injection?

## 2020-08-07 ENCOUNTER — Other Ambulatory Visit: Payer: Self-pay | Admitting: Family

## 2020-08-07 DIAGNOSIS — G4700 Insomnia, unspecified: Secondary | ICD-10-CM

## 2020-08-07 DIAGNOSIS — F418 Other specified anxiety disorders: Secondary | ICD-10-CM

## 2020-08-13 ENCOUNTER — Other Ambulatory Visit: Payer: Self-pay | Admitting: Family

## 2020-09-19 ENCOUNTER — Inpatient Hospital Stay: Payer: BC Managed Care – PPO

## 2020-09-21 ENCOUNTER — Telehealth: Payer: BC Managed Care – PPO | Admitting: Oncology

## 2020-09-25 ENCOUNTER — Encounter: Payer: Self-pay | Admitting: Neurology

## 2020-09-25 NOTE — Progress Notes (Addendum)
Patient has [employer] so can not be ran through [facility]. Called Plan @ [phone] and did reauth fopr Aimovig. PHARM ID#: [id number].   Received fax approval valid from 09/25/20 to 09/24/21.

## 2020-10-22 ENCOUNTER — Ambulatory Visit: Payer: BC Managed Care – PPO | Admitting: Family

## 2020-10-22 ENCOUNTER — Encounter: Payer: Self-pay | Admitting: Family

## 2020-10-22 ENCOUNTER — Other Ambulatory Visit: Payer: Self-pay

## 2020-10-22 ENCOUNTER — Ambulatory Visit (INDEPENDENT_AMBULATORY_CARE_PROVIDER_SITE_OTHER): Payer: BC Managed Care – PPO | Admitting: Family

## 2020-10-22 VITALS — BP 122/80 | HR 64 | Temp 98.1°F | Ht 64.5 in | Wt 219.8 lb

## 2020-10-22 DIAGNOSIS — F418 Other specified anxiety disorders: Secondary | ICD-10-CM

## 2020-10-22 DIAGNOSIS — G8929 Other chronic pain: Secondary | ICD-10-CM

## 2020-10-22 DIAGNOSIS — R5382 Chronic fatigue, unspecified: Secondary | ICD-10-CM

## 2020-10-22 DIAGNOSIS — E039 Hypothyroidism, unspecified: Secondary | ICD-10-CM

## 2020-10-22 MED ORDER — FLUOXETINE HCL 40 MG PO CAPS: 40.0000 mg | ORAL_CAPSULE | Freq: Every day | ORAL | 3 refills | Status: DC

## 2020-10-22 NOTE — Assessment & Plan Note (Signed)
Declines TSH today, would like labs done at follow up . Continue synthroid 

## 2020-10-22 NOTE — Assessment & Plan Note (Signed)
Resolved

## 2020-10-22 NOTE — Progress Notes (Signed)
Subjective:    Patient ID: [name], female    DOB: [birth year], 55 y.o.   MRN: [id number]  CC: [name] is a 55 y.o. female who presents today for follow up.   HPI: Feels well today No new complaints.  Started prozac 4 months ago. Depression and anxiety which has improved. She is enjoying work and energy has improved. She has days with breakthrough anxiety and depression and would like to trial increased dose of prozac. Grief from loss of sister. She is no longer on cymbalta, propranolol. Sleeping well on trazodone 100mg .    Hypothyroidism- compliant with synthroid   Pain management- Following with natalie corbin, NP whom prescribes gabapentin, fentanyl, percocet.   Follows with Dr - compliant with aimovig. No increase of headaches since propranolol discontinued.   Declines mammogram order. She would like to contact [name] for this.     HISTORY:  Past Medical History:  Diagnosis Date  . Anemia   . Anxiety and depression   . Chronic back pain   . DM2 (diabetes mellitus, type 2) (HCC)    improved since gastric bypass  . Headache    stress/sinus - 2-3x/wk  . History of left shoulder fracture   . HLD (hyperlipidemia)   . HTN (hypertension)    improved since gastric bypass  . Hypothyroid   . Left leg weakness    s/p back surgery  . Migraines   . Obesity   . Pernicious anemia   . S/P insertion of spinal cord stimulator   . Seizure [facility])    told she had a seizure as young child, not sure of context; mother states she didnt witness  . Wears contact lenses    Past Surgical History:  Procedure Laterality Date  . Abd [facility] - gallstones  5/03  . BACK SURGERY  2009   decompressive laminectomy  . BARIATRIC SURGERY  03/02/2012   gastric bypass; Dr 03/04/2012  . CHOLECYSTECTOMY  6/03  . COLONOSCOPY WITH PROPOFOL N/A 05/01/2017   Procedure: COLONOSCOPY WITH PROPOFOL;  Surgeon: 05/03/2017, MD;  Location: [facility];  Service:  Gastroenterology;  Laterality: N/A;  . DILATION AND CURETTAGE OF UTERUS  1/05  . ESOPHAGOGASTRODUODENOSCOPY (EGD) WITH PROPOFOL N/A 05/01/2017   Procedure: ESOPHAGOGASTRODUODENOSCOPY (EGD) WITH PROPOFOL;  Surgeon: 05/03/2017, MD;  Location: [facility];  Service: Gastroenterology;  Laterality: N/A;  . KNEE SURGERY     L - arthroscopic  . LIVER BIOPSY  6/03   fatty liver  . LUMBAR SPINE SURGERY  2015   Dr. 2016  . NASAL SINUS SURGERY    . PANNICULECTOMY  08/2013   [facility], Dr. 09/2013  . SHOULDER ARTHROSCOPY Left 10/06/2019  . sleep study - apnea  04/2001   improved since gastric bypass   Family History  Problem Relation Age of Onset  . Lung cancer Father   . Diabetes Mother   . Heart failure Mother   . Diabetes Sister   . Fibromyalgia Sister   . Colon cancer Maternal Aunt   . Skin cancer Paternal Aunt   . Skin cancer Paternal Uncle     Allergies: Latex and Penicillins Current Outpatient Medications on File Prior to Visit  Medication Sig Dispense Refill  . azelastine (ASTELIN) 0.1 % nasal spray Place 1 spray into both nostrils 2 (two) times daily. Use in each nostril as directed 30 mL 4  . cyanocobalamin (,VITAMIN B-12,) 1000 MCG/ML injection Inject 1 mL (1,000 mcg total) into the  muscle every 30 (thirty) days. 1 mL 3  . Erenumab-aooe (AIMOVIG) 70 MG/ML SOAJ Inject 70 mg into the skin every 28 (twenty-eight) days. 1 pen 11  . Estradiol-Norethindrone Acet 0.5-0.1 MG tablet Take 1 tablet by mouth daily. 30 tablet 11  . famotidine (PEPCID) 20 MG tablet TAKE 1 TABLET BY MOUTH TWICE A DAY 60 tablet 1  . fentaNYL (DURAGESIC - DOSED MCG/HR) 50 MCG/HR APPLY 1 PATCH(ES) EVERY 72 HOURS BY TRANSDERMAL ROUTE.  0  . folic acid (FOLVITE) 1 MG tablet Take 1 tablet (1 mg total) by mouth daily. 90 tablet 3  . gabapentin (NEURONTIN) 800 MG tablet Take 800 mg by mouth 3 (three) times daily.    Marland Kitchen levothyroxine (SYNTHROID) 100 MCG tablet TAKE 1 TABLET BY MOUTH EVERY DAY 30 tablet 7  .  loratadine (CLARITIN) 10 MG tablet Take 1 tablet (10 mg total) by mouth daily. 30 tablet 11  . Magnesium Citrate 200 MG TABS Take 400 mg by mouth daily. 120 tablet 1  . metaxalone (SKELAXIN) 800 MG tablet Take 800 mg by mouth 3 (three) times daily.    . mometasone (NASONEX) 50 MCG/ACT nasal spray Place 2 sprays into the nose daily.    . naloxone (NARCAN) nasal spray 4 mg/0.1 mL Place 1 spray into the nose as needed (in case of too much pain patch in her system).    Marland Kitchen NEEDLE, DISP, 25 G (B-D DISP NEEDLE 25GX1") 25G X 1" MISC Used to give monlthly B-12 injections. 1 each 5  . omeprazole (PRILOSEC) 20 MG capsule Take 1 capsule (20 mg total) by mouth daily. 30 capsule 3  . oxyCODONE-acetaminophen (PERCOCET/ROXICET) 5-325 MG tablet Take 1 tablet by mouth 2 (two) times daily as needed.    . SODIUM FLUORIDE 5000 PPM 1.1 % PSTE 3 (three) times daily.    . SYRINGE-NEEDLE, DISP, 3 ML 25G X 1" 3 ML MISC Used to give monthly B-12 injections. 1 each 5  . traZODone (DESYREL) 100 MG tablet TAKE 1 TABLET BY MOUTH EVERYDAY AT BEDTIME 90 tablet 2   No current facility-administered medications on file prior to visit.    Social History   Tobacco Use  . Smoking status: Never Smoker  . Smokeless tobacco: Never Used  . Tobacco comment: tobacco use - no  Vaping Use  . Vaping Use: Never used  Substance Use Topics  . Alcohol use: No  . Drug use: No    Review of Systems  Constitutional: Negative for chills and fever.  Respiratory: Negative for cough.   Cardiovascular: Negative for chest pain and palpitations.  Gastrointestinal: Negative for nausea and vomiting.  Psychiatric/Behavioral: Negative for sleep disturbance. The patient is nervous/anxious.       Objective:    BP 122/80   Pulse 64   Temp 98.1 F (36.7 C) (Oral)   Ht 5' 4.5" (1.638 m)   Wt 219 lb 12.8 oz (99.7 kg)   LMP 11/28/2019 (Approximate)   SpO2 95%   BMI 37.15 kg/m  BP Readings from Last 3 Encounters:  10/22/20 122/80  05/09/20  116/78  04/20/20 114/64   Wt Readings from Last 3 Encounters:  10/22/20 219 lb 12.8 oz (99.7 kg)  07/12/20 224 lb (101.6 kg)  05/09/20 224 lb 9.6 oz (101.9 kg)    Physical Exam Vitals reviewed.  Constitutional:      Appearance: She is well-developed and well-nourished.  Eyes:     Conjunctiva/sclera: Conjunctivae normal.  Cardiovascular:     Rate and Rhythm: Normal  rate and regular rhythm.     Pulses: Normal pulses.     Heart sounds: Normal heart sounds.  Pulmonary:     Effort: Pulmonary effort is normal.     Breath sounds: Normal breath sounds. No wheezing, rhonchi or rales.  Skin:    General: Skin is warm and dry.  Neurological:     Mental Status: She is alert.  Psychiatric:        Mood and Affect: Mood and affect normal.        Speech: Speech normal.        Behavior: Behavior normal.        Thought Content: Thought content normal.        Assessment & Plan:   Problem List Items Addressed This Visit      Endocrine   Hypothyroidism    Declines TSH today, would like labs done at follow up . Continue synthroid        Other   Chronic pain    Stable.  Following with Meyer Russel, NP whom prescribes gabapentin, fentanyl, percocet.        Relevant Medications   FLUoxetine (PROZAC) 40 MG capsule   Depression with anxiety - Primary    Improved. We agreed to increase prozac to 40mg . She will let me know how she is doing      Relevant Medications   FLUoxetine (PROZAC) 40 MG capsule   Fatigue    Resolved           I have discontinued Lauralye C. Wiebelhaus's DULoxetine, FLUoxetine, and propranolol. I am also having her start on FLUoxetine. Additionally, I am having her maintain her fentaNYL, naloxone, Magnesium Citrate, metaxalone, oxyCODONE-acetaminophen, Sodium Fluoride 5000 PPM, gabapentin, omeprazole, folic acid, famotidine, Aimovig, levothyroxine, Estradiol-Norethindrone Acet, SYRINGE-NEEDLE (DISP) 3 ML, B-D DISP NEEDLE 25GX1", mometasone, azelastine,  loratadine, cyanocobalamin, and traZODone.   Meds ordered this encounter  Medications  . FLUoxetine (PROZAC) 40 MG capsule    Sig: Take 1 capsule (40 mg total) by mouth daily.    Dispense:  90 capsule    Refill:  3    Order Specific Question:   Supervising Provider    Answer:   [[id number]]    Return precautions given.   Risks, benefits, and alternatives of the medications and treatment plan prescribed today were discussed, and patient expressed understanding.   Education regarding symptom management and diagnosis given to patient on AVS.  Continue to follow with Sherlene Shams, FNP for routine health maintenance.   Allegra Grana and I agreed with plan.   Cheryl Stain, FNP

## 2020-10-22 NOTE — Assessment & Plan Note (Signed)
Stable.  Following with Meyer Russel, NP whom prescribes gabapentin, fentanyl, percocet.

## 2020-10-22 NOTE — Assessment & Plan Note (Signed)
Improved. We agreed to increase prozac to 40mg . She will let me know how she is doing

## 2020-10-31 DIAGNOSIS — M5126 Other intervertebral disc displacement, lumbar region: Secondary | ICD-10-CM | POA: Diagnosis not present

## 2020-10-31 DIAGNOSIS — M533 Sacrococcygeal disorders, not elsewhere classified: Secondary | ICD-10-CM | POA: Diagnosis not present

## 2020-10-31 DIAGNOSIS — M5137 Other intervertebral disc degeneration, lumbosacral region: Secondary | ICD-10-CM | POA: Diagnosis not present

## 2020-10-31 DIAGNOSIS — Z79891 Long term (current) use of opiate analgesic: Secondary | ICD-10-CM | POA: Diagnosis not present

## 2020-10-31 DIAGNOSIS — G8929 Other chronic pain: Secondary | ICD-10-CM | POA: Diagnosis not present

## 2020-10-31 DIAGNOSIS — Z5181 Encounter for therapeutic drug level monitoring: Secondary | ICD-10-CM | POA: Diagnosis not present

## 2020-11-12 ENCOUNTER — Other Ambulatory Visit: Payer: Self-pay | Admitting: Neurology

## 2020-11-12 ENCOUNTER — Telehealth: Payer: Self-pay | Admitting: Neurology

## 2020-11-12 MED ORDER — AJOVY 225 MG/1.5ML ~~LOC~~ SOAJ: 225.0000 mg | SUBCUTANEOUS | 5 refills | Status: DC

## 2020-11-12 NOTE — Telephone Encounter (Signed)
Patient called in stating she tried to refill her Aimovig prescription and it's going to cost her over $600. She is wondering if there is something different she could take?

## 2020-11-12 NOTE — Telephone Encounter (Signed)
[employer] is no longer covering Aimovig in 2022.  I have sent a prescription for Ajovy, another injection in the same class and statistically just as effective.  Script [facility] [place].  It is taken every 28 days.

## 2020-11-12 NOTE — Telephone Encounter (Signed)
Please see, can this be changed?

## 2020-11-13 NOTE — Telephone Encounter (Signed)
Left message of the above,

## 2020-11-13 NOTE — Telephone Encounter (Signed)
Left message for patient to come by for ajovy card.

## 2020-11-13 NOTE — Telephone Encounter (Signed)
No answer at 1126

## 2020-11-13 NOTE — Telephone Encounter (Signed)
Patient called and said her Ajovy cost $1,200.00 and she cannot afford that. She was told by the pharmacist that most insurance companies are not covering injectables.  [facility] [place]

## 2020-11-14 MED ORDER — QULIPTA 60 MG PO TABS: 60.0000 mg | ORAL_TABLET | Freq: Every day | ORAL | 2 refills | Status: DC

## 2020-11-14 NOTE — Telephone Encounter (Signed)
We can start Qulipta 60mg  tablet once daily.  Please send to [facility].  Let her know it is a speciality mail order pharmacy and that they will call/text her in the next couple of days to confirm prescription (please provide her their number).  Thank you

## 2020-11-14 NOTE — Telephone Encounter (Signed)
Pt advised. Order added

## 2020-11-14 NOTE — Telephone Encounter (Signed)
Patient states that she is unable to come to office to pick up Ajovy discount card due to working in [place]. She called to ask if Dr Everlena Cooper could call her in a pill prescription that would help instead of an injection since her insurance won't cover injectable meds. Please call patient back on her cell number [phone]

## 2021-01-01 ENCOUNTER — Other Ambulatory Visit: Payer: Self-pay | Admitting: Family

## 2021-01-01 NOTE — Telephone Encounter (Signed)
Spoke with the dentist office and they stated that they were able to get in touch with Dr. Jerelyn Scott and she went ahead and called in a rx for the pt.

## 2021-01-01 NOTE — Telephone Encounter (Signed)
FYI I have left 2 messages with patient. I would like to speak her regarding dental  Procedure  She is on several sedating medications and I worry about excessive sedation, respiratory suppression.  I would like to speak with patient prior to prescribing  I  Have pended valium 2mg  to take prior to procedure once I have spoken to  Patient  Please also return dentist phone call letting her know the above  I looked up patient on [place] Controlled Substances Reporting System PMP AWARE and saw no activity that raised concern of inappropriate use. She routinely is prescribed controlled substances from [facility].

## 2021-01-01 NOTE — Telephone Encounter (Signed)
[name] [employer] office called wanting to know if [name] would call pt in Valium. She is has a root canal done today at 1:30. Dr. Jerelyn Scott is out of town and there office doesn't have anyone with a drug license    please call [phone] with any questions

## 2021-01-22 NOTE — Progress Notes (Deleted)
Virtual Visit via Video Note The purpose of this virtual visit is to provide medical care while limiting exposure to the novel coronavirus.    Consent was obtained for video visit:  Yes.   Answered questions that patient had about telehealth interaction:  Yes.   I discussed the limitations, risks, security and privacy concerns of performing an evaluation and management service by telemedicine. I also discussed with the patient that there may be a patient responsible charge related to this service. The patient expressed understanding and agreed to proceed.  Pt location: Home Physician Location: office Name of referring provider:  Allegra Grana, FNP I connected with [name] at patients initiation/request on 01/23/2021 at  8:50 AM EDT by video enabled telemedicine application and verified that I am speaking with the correct person using two identifiers. Pt MRN:  [id number] Pt DOB:  1966/04/30 Video Participants:  [name]  Assessment and Plan:   ***  History of Present Illness:  [name] is a 55 year old right-handed Caucasian female with type 2 diabetes, chronic back pain, depression and anxiety and history of gastric bypass surgery who follows up for migraines.  UPDATE: Started Aimovig in June 2021 which was effective (***), however beginning this year her insurance would no longer cover Aimovig.  She was changed to [place] in late February..   Intensity:  *** Duration:  *** Frequency:  *** Frequency of abortive medication: *** Current NSAIDS:  none Current analgesics:  Fentanyl patch (chronic back pain) Percocet (back pain), Excedrin, Tylenol Current triptans:  none Current ergotamine:  none Current anti-emetic:  Zofran ODT 4mg , promethazine 12.5mg  Current muscle relaxants:  Skelaxin Current anti-anxiolytic:  none Current sleep aide:  trazodone Current Antihypertensive medications:  none Current Antidepressant medications:  Cymbalta 60mg   daily, trazodone Current Anticonvulsant medications:  Gabapentin 800mg  TID Current anti-CGRP:  Qulipta 60mg  QD Current Vitamins/Herbal/Supplements:  Magnesium citrate 400mg  Current Antihistamines/Decongestants:  none Other therapy:  none Hormone/birth control:  none  Caffeine:  Rarely drinks coffee.  No caffeinated soda. Diet:  Drinks a lot of water.  Drinks protein drink for breakfast and lunch.  Sometimes has decreased appetite since gastric bypass in 2013. Exercise:  Not routine (limited due to back pain) Depression: yes; Anxiety:  no Other pain:  Chronic back pain.  Sometimes neck pain. Sleep:  Good (due to medications)  HISTORY: Onset:   After a fall in April 2020, hitting her head.  Location:  Often starts in the temples and may move across forehead or back of head Quality:  Starts as pressure and progresses to a sharp pain Initial intensity:  Usually moderate, sometimes severe Aura:  none Premonitory Phase:  none Postdrome:  none Associated symptoms:  Occasional nausea, photophobia, phonophobia.  She denies associated visual disturbance, unilateral numbness or weakness. Initial Duration:  30 to 45 minutes Initial Frequency:  Daily, sometimes more than once a day Initial Frequency of abortive medication: 5 out of 7 days a week.  Excedrin at least twice a week, otherwise may take Tylenol Triggers:  no Relieving factors:  Excedrin Activity:  Aggravates  Previous history of different headaches, migraines that would put her in bed, with nausea, photophobia, phonophobia   Past NSAIDS:  naproxen Past analgesics:  none Past abortive triptans:  none Past abortive ergotamine:  none Past muscle relaxants:  none Past anti-emetic:  none Past antihypertensive medications:  propranolol Past antidepressant medications:  Venlafaxine; citalopram, fluoxetine, Wellbutrin Past anticonvulsant medications:  Lyrica Past anti-CGRP:  Aimovig  70mg  (effective but no longer covered by  insurance) Past vitamins/Herbal/Supplements:  none Past antihistamines/decongestants:  meclizine Other past therapies:  none   Family history of headache:  No  Imaging (personally reviewed): 01/02/2019 CT HEAD WO:  Stable right frontal encephalomalacia but no acute intracranial abnormality. 09/21/2018 CTA HEAD & NECK:  Negative  Past Medical History: Past Medical History:  Diagnosis Date  . Anemia   . Anxiety and depression   . Chronic back pain   . DM2 (diabetes mellitus, type 2) (HCC)    improved since gastric bypass  . Headache    stress/sinus - 2-3x/wk  . History of left shoulder fracture   . HLD (hyperlipidemia)   . HTN (hypertension)    improved since gastric bypass  . Hypothyroid   . Left leg weakness    s/p back surgery  . Migraines   . Obesity   . Pernicious anemia   . S/P insertion of spinal cord stimulator   . Seizure [facility])    told she had a seizure as young child, not sure of context; mother states she didnt witness  . Wears contact lenses     Medications: Outpatient Encounter Medications as of 01/23/2021  Medication Sig  . Atogepant (QULIPTA) 60 MG TABS Take 60 mg by mouth daily.  03/25/2021 azelastine (ASTELIN) 0.1 % nasal spray Place 1 spray into both nostrils 2 (two) times daily. Use in each nostril as directed  . cyanocobalamin (,VITAMIN B-12,) 1000 MCG/ML injection Inject 1 mL (1,000 mcg total) into the muscle every 30 (thirty) days.  . Estradiol-Norethindrone Acet 0.5-0.1 MG tablet Take 1 tablet by mouth daily.  . famotidine (PEPCID) 20 MG tablet TAKE 1 TABLET BY MOUTH TWICE A DAY  . fentaNYL (DURAGESIC - DOSED MCG/HR) 50 MCG/HR APPLY 1 PATCH(ES) EVERY 72 HOURS BY TRANSDERMAL ROUTE.  Marland Kitchen FLUoxetine (PROZAC) 40 MG capsule Take 1 capsule (40 mg total) by mouth daily.  . folic acid (FOLVITE) 1 MG tablet Take 1 tablet (1 mg total) by mouth daily.  . Fremanezumab-vfrm (AJOVY) 225 MG/1.5ML SOAJ Inject 225 mg into the skin every 28 (twenty-eight) days.  Marland Kitchen  gabapentin (NEURONTIN) 800 MG tablet Take 800 mg by mouth 3 (three) times daily.  Marland Kitchen levothyroxine (SYNTHROID) 100 MCG tablet TAKE 1 TABLET BY MOUTH EVERY DAY  . loratadine (CLARITIN) 10 MG tablet Take 1 tablet (10 mg total) by mouth daily.  . Magnesium Citrate 200 MG TABS Take 400 mg by mouth daily.  . metaxalone (SKELAXIN) 800 MG tablet Take 800 mg by mouth 3 (three) times daily.  . mometasone (NASONEX) 50 MCG/ACT nasal spray Place 2 sprays into the nose daily.  . naloxone (NARCAN) nasal spray 4 mg/0.1 mL Place 1 spray into the nose as needed (in case of too much pain patch in her system).  Marland Kitchen NEEDLE, DISP, 25 G (B-D DISP NEEDLE 25GX1") 25G X 1" MISC Used to give monlthly B-12 injections.  Marland Kitchen omeprazole (PRILOSEC) 20 MG capsule Take 1 capsule (20 mg total) by mouth daily.  Marland Kitchen oxyCODONE-acetaminophen (PERCOCET/ROXICET) 5-325 MG tablet Take 1 tablet by mouth 2 (two) times daily as needed.  . SODIUM FLUORIDE 5000 PPM 1.1 % PSTE 3 (three) times daily.  . SYRINGE-NEEDLE, DISP, 3 ML 25G X 1" 3 ML MISC Used to give monthly B-12 injections.  . traZODone (DESYREL) 100 MG tablet TAKE 1 TABLET BY MOUTH EVERYDAY AT BEDTIME   No facility-administered encounter medications on file as of 01/23/2021.    Allergies: Allergies  Allergen Reactions  .  Latex Rash  . Penicillins Itching and Swelling    Has patient had a PCN reaction causing immediate rash, facial/tongue/throat swelling, SOB or lightheadedness with hypotension: yes Has patient had a PCN reaction causing severe rash involving mucus membranes or skin necrosis: no Has patient had a PCN reaction that required hospitalization : yes ed visit Has patient had a PCN reaction occurring within the last 10 years: yes If all of the above answers are "NO", then may proceed with Cephalosporin use.     Family History: Family History  Problem Relation Age of Onset  . Lung cancer Father   . Diabetes Mother   . Heart failure Mother   . Diabetes Sister   .  Fibromyalgia Sister   . Colon cancer Maternal Aunt   . Skin cancer Paternal Aunt   . Skin cancer Paternal Uncle     Observations/Objective:   *** No acute distress.  Alert and oriented.  Speech fluent and not dysarthric.  Language intact.  Eyes orthophoric on primary gaze.  Face symmetric.   Follow Up Instructions:    -I discussed the assessment and treatment plan with the patient. The patient was provided an opportunity to ask questions and all were answered. The patient agreed with the plan and demonstrated an understanding of the instructions.   The patient was advised to call back or seek an in-person evaluation if the symptoms worsen or if the condition fails to improve as anticipated.    Total Time spent in visit with the patient was:  ***, of which more than 50% of the time was spent in counseling and/or coordinating care on ***.   Pt understands and agrees with the plan of care outlined.     Cira Servant, DO

## 2021-01-23 ENCOUNTER — Other Ambulatory Visit: Payer: Self-pay

## 2021-01-23 ENCOUNTER — Telehealth: Payer: BC Managed Care – PPO | Admitting: Neurology

## 2021-02-04 ENCOUNTER — Other Ambulatory Visit: Payer: Self-pay | Admitting: Neurology

## 2021-02-22 DIAGNOSIS — Z5181 Encounter for therapeutic drug level monitoring: Secondary | ICD-10-CM | POA: Diagnosis not present

## 2021-02-22 DIAGNOSIS — M5126 Other intervertebral disc displacement, lumbar region: Secondary | ICD-10-CM | POA: Diagnosis not present

## 2021-02-22 DIAGNOSIS — M533 Sacrococcygeal disorders, not elsewhere classified: Secondary | ICD-10-CM | POA: Diagnosis not present

## 2021-02-22 DIAGNOSIS — G8929 Other chronic pain: Secondary | ICD-10-CM | POA: Diagnosis not present

## 2021-02-22 DIAGNOSIS — Z79891 Long term (current) use of opiate analgesic: Secondary | ICD-10-CM | POA: Diagnosis not present

## 2021-02-22 DIAGNOSIS — M5137 Other intervertebral disc degeneration, lumbosacral region: Secondary | ICD-10-CM | POA: Diagnosis not present

## 2021-02-26 ENCOUNTER — Other Ambulatory Visit: Payer: Self-pay | Admitting: Certified Nurse Midwife

## 2021-03-21 ENCOUNTER — Emergency Department (HOSPITAL_COMMUNITY)
Admission: EM | Admit: 2021-03-21 | Discharge: 2021-03-21 | Disposition: A | Discharge status: Home / Self Care | Payer: BC Managed Care – PPO | Attending: Emergency Medicine | Admitting: Emergency Medicine

## 2021-03-21 ENCOUNTER — Encounter (HOSPITAL_COMMUNITY): Payer: Self-pay | Admitting: Emergency Medicine

## 2021-03-21 ENCOUNTER — Other Ambulatory Visit: Payer: Self-pay

## 2021-03-21 ENCOUNTER — Emergency Department (HOSPITAL_COMMUNITY): Payer: BC Managed Care – PPO

## 2021-03-21 DIAGNOSIS — M25522 Pain in left elbow: Secondary | ICD-10-CM | POA: Diagnosis not present

## 2021-03-21 DIAGNOSIS — R0902 Hypoxemia: Secondary | ICD-10-CM | POA: Diagnosis not present

## 2021-03-21 DIAGNOSIS — Z79899 Other long term (current) drug therapy: Secondary | ICD-10-CM | POA: Insufficient documentation

## 2021-03-21 DIAGNOSIS — E119 Type 2 diabetes mellitus without complications: Secondary | ICD-10-CM | POA: Diagnosis not present

## 2021-03-21 DIAGNOSIS — I10 Essential (primary) hypertension: Secondary | ICD-10-CM | POA: Diagnosis not present

## 2021-03-21 DIAGNOSIS — S5002XA Contusion of left elbow, initial encounter: Secondary | ICD-10-CM | POA: Insufficient documentation

## 2021-03-21 DIAGNOSIS — M25532 Pain in left wrist: Secondary | ICD-10-CM | POA: Diagnosis not present

## 2021-03-21 DIAGNOSIS — Z91040 Latex allergy status: Secondary | ICD-10-CM | POA: Diagnosis not present

## 2021-03-21 DIAGNOSIS — E039 Hypothyroidism, unspecified: Secondary | ICD-10-CM | POA: Diagnosis not present

## 2021-03-21 DIAGNOSIS — M545 Low back pain, unspecified: Secondary | ICD-10-CM | POA: Diagnosis not present

## 2021-03-21 DIAGNOSIS — S60212A Contusion of left wrist, initial encounter: Secondary | ICD-10-CM | POA: Diagnosis not present

## 2021-03-21 DIAGNOSIS — S8011XA Contusion of right lower leg, initial encounter: Secondary | ICD-10-CM | POA: Diagnosis not present

## 2021-03-21 DIAGNOSIS — M25512 Pain in left shoulder: Secondary | ICD-10-CM | POA: Insufficient documentation

## 2021-03-21 DIAGNOSIS — M79642 Pain in left hand: Secondary | ICD-10-CM | POA: Diagnosis not present

## 2021-03-21 DIAGNOSIS — T07XXXA Unspecified multiple injuries, initial encounter: Secondary | ICD-10-CM

## 2021-03-21 DIAGNOSIS — S59902A Unspecified injury of left elbow, initial encounter: Secondary | ICD-10-CM | POA: Diagnosis not present

## 2021-03-21 DIAGNOSIS — S40012A Contusion of left shoulder, initial encounter: Secondary | ICD-10-CM | POA: Diagnosis not present

## 2021-03-21 DIAGNOSIS — Y92410 Unspecified street and highway as the place of occurrence of the external cause: Secondary | ICD-10-CM | POA: Insufficient documentation

## 2021-03-21 MED ORDER — HYDROCODONE-ACETAMINOPHEN 5-325 MG PO TABS
2.0000 | ORAL_TABLET | Freq: Once | ORAL | Status: AC
  Administered 2021-03-21: 2 via ORAL
  Filled 2021-03-21: qty 2

## 2021-03-21 MED ORDER — METHOCARBAMOL 500 MG PO TABS: 500.0000 mg | ORAL_TABLET | Freq: Two times a day (BID) | ORAL | 0 refills | Status: DC | PRN

## 2021-03-21 MED ORDER — MELOXICAM 7.5 MG PO TABS: 7.5000 mg | ORAL_TABLET | Freq: Two times a day (BID) | ORAL | 0 refills | Status: DC | PRN

## 2021-03-21 NOTE — Discharge Instructions (Addendum)
Thank you for letting us take care of you today! ° °Please obtain all of your results from medical records or have your doctors office obtain the results - share them with your doctor - you should be seen at your doctors office in the next 2 days. Call today to arrange your follow up. Take the medications as prescribed. Please review all of the medicines and only take them if you do not have an allergy to them. Please be aware that if you are taking birth control pills, taking other prescriptions, ESPECIALLY ANTIBIOTICS may make the birth control ineffective - if this is the case, either do not engage in sexual activity or use alternative methods of birth control such as condoms until you have finished the medicine and your family doctor says it is OK to restart them. If you are on a blood thinner such as COUMADIN, be aware that any other medicine that you take may cause the coumadin to either work too much, or not enough - you should have your coumadin level rechecked in next 7 days if this is the case.  °?  °It is also a possibility that you have an allergic reaction to any of the medicines that you have been prescribed - Everybody reacts differently to medications and while MOST people have no trouble with most medicines, you may have a reaction such as nausea, vomiting, rash, swelling, shortness of breath. If this is the case, please stop taking the medicine immediately and contact your physician.  ° °If you were given a medication in the ED such as percocet, vicodin, or morphine, be aware that these medicines are sedating and may change your ability to take care of yourself adequately for several hours after being given this medicines - you should not drive or take care of small children if you were given this medicine in the Emergency Department or if you have been prescribed these types of medicines. °?  ° You should return to the ER IMMEDIATELY if you develop severe or worsening symptoms.  ° °

## 2021-03-21 NOTE — ED Notes (Signed)
Discharge instructions and meds reviewed and explained, pt verbalized understanding.
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
## 2021-03-25 ENCOUNTER — Other Ambulatory Visit: Payer: Self-pay | Admitting: Certified Nurse Midwife

## 2021-03-25 ENCOUNTER — Encounter: Payer: Self-pay | Admitting: Oncology

## 2021-03-27 ENCOUNTER — Other Ambulatory Visit: Payer: Self-pay

## 2021-03-27 ENCOUNTER — Emergency Department
Admission: EM | Admit: 2021-03-27 | Discharge: 2021-03-27 | Disposition: A | Discharge status: Home / Self Care | Payer: BC Managed Care – PPO | Attending: Emergency Medicine | Admitting: Emergency Medicine

## 2021-03-27 ENCOUNTER — Encounter: Payer: Self-pay | Admitting: *Deleted

## 2021-03-27 ENCOUNTER — Encounter: Payer: Self-pay | Admitting: Family

## 2021-03-27 ENCOUNTER — Telehealth (INDEPENDENT_AMBULATORY_CARE_PROVIDER_SITE_OTHER): Payer: BC Managed Care – PPO | Admitting: Family

## 2021-03-27 VITALS — Ht 65.51 in | Wt 215.0 lb

## 2021-03-27 DIAGNOSIS — Z888 Allergy status to other drugs, medicaments and biological substances status: Secondary | ICD-10-CM | POA: Diagnosis not present

## 2021-03-27 DIAGNOSIS — Z79891 Long term (current) use of opiate analgesic: Secondary | ICD-10-CM | POA: Diagnosis not present

## 2021-03-27 DIAGNOSIS — F418 Other specified anxiety disorders: Secondary | ICD-10-CM

## 2021-03-27 DIAGNOSIS — Z79899 Other long term (current) drug therapy: Secondary | ICD-10-CM | POA: Insufficient documentation

## 2021-03-27 DIAGNOSIS — F1193 Opioid use, unspecified with withdrawal: Secondary | ICD-10-CM

## 2021-03-27 DIAGNOSIS — F419 Anxiety disorder, unspecified: Secondary | ICD-10-CM | POA: Insufficient documentation

## 2021-03-27 DIAGNOSIS — Z91040 Latex allergy status: Secondary | ICD-10-CM | POA: Insufficient documentation

## 2021-03-27 DIAGNOSIS — R519 Headache, unspecified: Secondary | ICD-10-CM | POA: Diagnosis not present

## 2021-03-27 DIAGNOSIS — F1123 Opioid dependence with withdrawal: Secondary | ICD-10-CM | POA: Diagnosis not present

## 2021-03-27 DIAGNOSIS — E119 Type 2 diabetes mellitus without complications: Secondary | ICD-10-CM | POA: Diagnosis not present

## 2021-03-27 DIAGNOSIS — G894 Chronic pain syndrome: Secondary | ICD-10-CM | POA: Diagnosis not present

## 2021-03-27 DIAGNOSIS — I10 Essential (primary) hypertension: Secondary | ICD-10-CM | POA: Insufficient documentation

## 2021-03-27 DIAGNOSIS — E039 Hypothyroidism, unspecified: Secondary | ICD-10-CM | POA: Insufficient documentation

## 2021-03-27 DIAGNOSIS — R000 Tachycardia, unspecified: Secondary | ICD-10-CM | POA: Diagnosis not present

## 2021-03-27 MED ORDER — VENLAFAXINE HCL ER 37.5 MG PO CP24: 37.5000 mg | ORAL_CAPSULE | Freq: Every day | ORAL | 1 refills | Status: DC

## 2021-03-27 MED ORDER — FLUOXETINE HCL 20 MG PO CAPS: 20.0000 mg | ORAL_CAPSULE | Freq: Every morning | ORAL | 0 refills | Status: DC

## 2021-03-27 MED ORDER — PROMETHAZINE HCL 12.5 MG PO TABS: 12.5000 mg | ORAL_TABLET | Freq: Three times a day (TID) | ORAL | 0 refills | Status: DC | PRN

## 2021-03-27 MED ORDER — MORPHINE SULFATE (PF) 4 MG/ML IV SOLN
8.0000 mg | Freq: Once | INTRAVENOUS | Status: AC
Start: 2021-03-27 — End: 2021-03-27
  Administered 2021-03-27: 8 mg via INTRAMUSCULAR
  Filled 2021-03-27: qty 2

## 2021-03-27 NOTE — Patient Instructions (Signed)
Decrease prozac to 20mg . After 4 days, you may start effexor low dose 37.5mg  daily. You may stop the prozac 20mg  after 7 days.

## 2021-03-27 NOTE — ED Triage Notes (Addendum)
Pt states she took 2 robaxin at 2000, woke up and had been antsy all night, thought maybe she had too much medication and so she took intranasal narcan about 20 minutes prior to arrival. Pt has on fentanyl patch for chronic back pain, reports that they are decreasing her dosage. She is anxious, diaphoretic and nauseated.

## 2021-03-27 NOTE — Assessment & Plan Note (Signed)
Uncontrolled. Wean to prozac 20mg  for 7 days then stop. She will start low dose effexor 37.5mg  during day 3 or 4 of prozac 20mg . Continue trazodone 100mg . Referral for counseling.

## 2021-03-27 NOTE — Assessment & Plan Note (Signed)
Unchanged from prior. She has run out of [place] as prescribed from Dr Everlena Cooper. Advised her to keep upcoming follow up. Meanwhile, we will start effexor for HA prevention. Refilled phenergan. Close follow up.

## 2021-03-27 NOTE — Discharge Instructions (Addendum)
Continue your medications as prescribed. Be careful Narcan-ing yourself, and causing yourself to go into opiate withdrawals.
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
## 2021-03-27 NOTE — Progress Notes (Signed)
Virtual Visit via Video Note  I connected with@  on 03/27/21 at  3:30 PM EDT by a video enabled telemedicine application and verified that I am speaking with the correct person using two identifiers.  Location patient: home Location provider:work  Persons participating in the virtual visit: patient, provider  I discussed the limitations of evaluation and management by telemedicine and the availability of in person appointments. The patient expressed understanding and agreed to proceed.   HPI: Seen in ED today for opiate withdrawal after narcan after robaxin. Given IM morphine with resolution of symptoms. She feels  '150% better'. No CP, palpitations.   Here today to discuss HA and concern for low iron stores.   She has fentanyl patch as prescribed by [facility].   Migraine - she follows with Dr Everlena Cooper with appt 06/2021.   She is currently on qulipta and ran out and would like refill however he will not refill per patient until she is seen in October.  Bennie Pierini has been helpful. She is experiences HA everyday. Presentation is unchanged. No vision loss.  Migraine occurs 3 times per week. Associated with nausea. She requests phenergan refill. She cannot take NSAIDs due to h/o gastric bypass.   She is no longer on Magnesium citrate We had tried propranolol in the past  She is compliant with trazodone 100mg  , prozac 40mg . Trazodone has been very helpful for sleep. Coming up on one year since sister's death which is really hard. She has days in which depression in worse.  No thoughts of hurting herself or anyone else.   Tried wellbutrin, cymbalta, effexor ( in 2012).   Complains of continued fatigue and concerned her iron stores are low. She would like this checked.   ROS: See pertinent positives and negatives per HPI.    EXAM:  VITALS per patient if applicable: Ht 5' 5.51" (1.664 m)   Wt 215 lb (97.5 kg)   LMP 11/28/2019 (Approximate)   BMI 35.22 kg/m  BP Readings from Last 3  Encounters:  03/27/21 (!) 155/80  03/21/21 (!) 142/67  10/22/20 122/80   Wt Readings from Last 3 Encounters:  03/27/21 215 lb (97.5 kg)  03/21/21 215 lb (97.5 kg)  10/22/20 219 lb 12.8 oz (99.7 kg)    GENERAL: alert, oriented, appears well and in no acute distress  HEENT: atraumatic, conjunttiva clear, no obvious abnormalities on inspection of external nose and ears  NECK: normal movements of the head and neck  LUNGS: on inspection no signs of respiratory distress, breathing rate appears normal, no obvious gross SOB, gasping or wheezing  CV: no obvious cyanosis  MS: moves all visible extremities without noticeable abnormality  PSYCH/NEURO: pleasant and cooperative, no obvious depression or anxiety, speech and thought processing grossly intact  ASSESSMENT AND PLAN:  Discussed the following assessment and plan:  Problem List Items Addressed This Visit       Other   Depression with anxiety    Uncontrolled. Wean to prozac 20mg  for 7 days then stop. She will start low dose effexor 37.5mg  during day 3 or 4 of prozac 20mg . Continue trazodone 100mg . Referral for counseling.        Relevant Medications   FLUoxetine (PROZAC) 20 MG capsule   venlafaxine XR (EFFEXOR XR) 37.5 MG 24 hr capsule   Other Relevant Orders   Ambulatory referral to Psychology   Ambulatory referral to Obstetrics / Gynecology   Headache - Primary    Unchanged from prior. She has run out of 05/22/21  as prescribed from Dr Everlena Cooper. Advised her to keep upcoming follow up. Meanwhile, we will start effexor for HA prevention. Refilled phenergan. Close follow up.        Relevant Medications   fentaNYL (DURAGESIC) 25 MCG/HR   FLUoxetine (PROZAC) 20 MG capsule   venlafaxine XR (EFFEXOR XR) 37.5 MG 24 hr capsule   promethazine (PHENERGAN) 12.5 MG tablet   Other Relevant Orders   Ambulatory referral to Psychology   B12 and Folate Panel   IBC + Ferritin   TSH   CBC with Differential/Platelet   Comprehensive  metabolic panel   Hemoglobin A1c   Lipid panel   She declines screening for breast cancer   -we discussed possible serious and likely etiologies, options for evaluation and workup, limitations of telemedicine visit vs in person visit, treatment, treatment risks and precautions. Pt prefers to treat via telemedicine empirically rather then risking or undertaking an in person visit at this moment.  .   I discussed the assessment and treatment plan with the patient. The patient was provided an opportunity to ask questions and all were answered. The patient agreed with the plan and demonstrated an understanding of the instructions.   The patient was advised to call back or seek an in-person evaluation if the symptoms worsen or if the condition fails to improve as anticipated.   Rennie Plowman, FNP

## 2021-03-29 ENCOUNTER — Telehealth: Payer: Self-pay | Admitting: Certified Nurse Midwife

## 2021-03-29 NOTE — Telephone Encounter (Signed)
Pt called to schedule her physical- also has ref from [employer], pt states  that she really wants to stay with [facility]., I made pt aware her next opening was 04-29-2021, she went ahead and scheduled- but is going to run out of her medication : Estradiol; pt is requesting if she can get a refill to get her to her upcoming apt. Pt last seen by AT on 03-13-2020. Please Advise.

## 2021-03-31 ENCOUNTER — Other Ambulatory Visit: Payer: Self-pay | Admitting: Certified Nurse Midwife

## 2021-03-31 MED ORDER — ESTRADIOL-NORETHINDRONE ACET 0.5-0.1 MG PO TABS: 1.0000 | ORAL_TABLET | Freq: Every day | ORAL | 2 refills | Status: DC

## 2021-04-01 ENCOUNTER — Other Ambulatory Visit: Payer: BC Managed Care – PPO

## 2021-04-01 NOTE — Telephone Encounter (Signed)
LVM with patient letting her know she has been sent 2 refills to hold her over til her next appt with [place] and to call with any questions or concerns.

## 2021-04-03 ENCOUNTER — Other Ambulatory Visit: Payer: Self-pay

## 2021-04-03 ENCOUNTER — Encounter: Payer: Self-pay | Admitting: Family

## 2021-04-03 ENCOUNTER — Encounter: Payer: Self-pay | Admitting: Oncology

## 2021-04-03 ENCOUNTER — Telehealth: Payer: Self-pay

## 2021-04-03 ENCOUNTER — Other Ambulatory Visit (INDEPENDENT_AMBULATORY_CARE_PROVIDER_SITE_OTHER): Payer: BC Managed Care – PPO

## 2021-04-03 DIAGNOSIS — R519 Headache, unspecified: Secondary | ICD-10-CM | POA: Diagnosis not present

## 2021-04-03 LAB — CBC WITH DIFFERENTIAL/PLATELET
Basophils Absolute: 0 10*3/uL (ref 0.0–0.1)
Basophils Relative: 0.5 % (ref 0.0–3.0)
Eosinophils Absolute: 0.2 10*3/uL (ref 0.0–0.7)
Eosinophils Relative: 3.1 % (ref 0.0–5.0)
HCT: 29.9 % — ABNORMAL LOW (ref 36.0–46.0)
Hemoglobin: 9.8 g/dL — ABNORMAL LOW (ref 12.0–15.0)
Lymphocytes Relative: 26.5 % (ref 12.0–46.0)
Lymphs Abs: 1.5 10*3/uL (ref 0.7–4.0)
MCHC: 32.9 g/dL (ref 30.0–36.0)
MCV: 84.2 fl (ref 78.0–100.0)
Monocytes Absolute: 0.4 10*3/uL (ref 0.1–1.0)
Monocytes Relative: 7.1 % (ref 3.0–12.0)
Neutro Abs: 3.7 10*3/uL (ref 1.4–7.7)
Neutrophils Relative %: 62.8 % (ref 43.0–77.0)
Platelets: 206 10*3/uL (ref 150.0–400.0)
RBC: 3.55 Mil/uL — ABNORMAL LOW (ref 3.87–5.11)
RDW: 15.5 % (ref 11.5–15.5)
WBC: 5.8 10*3/uL (ref 4.0–10.5)

## 2021-04-03 LAB — TSH: TSH: 4.53 u[IU]/mL (ref 0.35–5.50)

## 2021-04-03 LAB — COMPREHENSIVE METABOLIC PANEL
ALT: 16 U/L (ref 0–35)
AST: 18 U/L (ref 0–37)
Albumin: 3.6 g/dL (ref 3.5–5.2)
Alkaline Phosphatase: 79 U/L (ref 39–117)
BUN: 14 mg/dL (ref 6–23)
CO2: 30 mEq/L (ref 19–32)
Calcium: 8.6 mg/dL (ref 8.4–10.5)
Chloride: 107 mEq/L (ref 96–112)
Creatinine, Ser: 0.71 mg/dL (ref 0.40–1.20)
GFR: 96.08 mL/min (ref >60.00)
Glucose, Bld: 93 mg/dL (ref 70–99)
Potassium: 3.6 mEq/L (ref 3.5–5.1)
Sodium: 144 mEq/L (ref 135–145)
Total Bilirubin: 0.4 mg/dL (ref 0.2–1.2)
Total Protein: 6.2 g/dL (ref 6.0–8.3)

## 2021-04-03 LAB — LIPID PANEL
Cholesterol: 103 mg/dL (ref 0–200)
HDL: 44.8 mg/dL (ref >39.00)
LDL Cholesterol: 46 mg/dL (ref 0–99)
NonHDL: 58.24
Total CHOL/HDL Ratio: 2
Triglycerides: 61 mg/dL (ref 0.0–149.0)
VLDL: 12.2 mg/dL (ref 0.0–40.0)

## 2021-04-03 LAB — IBC + FERRITIN
Ferritin: 6.4 ng/mL — ABNORMAL LOW (ref 10.0–291.0)
Iron: 33 ug/dL — ABNORMAL LOW (ref 42–145)
Saturation Ratios: 7.2 % — ABNORMAL LOW (ref 20.0–50.0)
Transferrin: 328 mg/dL (ref 212.0–360.0)

## 2021-04-03 LAB — B12 AND FOLATE PANEL
Folate: 22.6 ng/mL (ref >5.9)
Vitamin B-12: 272 pg/mL (ref 211–911)

## 2021-04-03 LAB — HEMOGLOBIN A1C: Hgb A1c MFr Bld: 6.1 % (ref 4.6–6.5)

## 2021-04-03 NOTE — Telephone Encounter (Signed)
LMTCB for labs. 

## 2021-04-04 ENCOUNTER — Encounter: Payer: Self-pay | Admitting: Oncology

## 2021-04-12 ENCOUNTER — Other Ambulatory Visit: Payer: Self-pay | Admitting: Oncology

## 2021-04-12 ENCOUNTER — Inpatient Hospital Stay: Payer: No Typology Code available for payment source | Attending: Oncology

## 2021-04-12 VITALS — BP 134/80 | HR 60 | Temp 97.0°F | Resp 18

## 2021-04-12 DIAGNOSIS — M5137 Other intervertebral disc degeneration, lumbosacral region: Secondary | ICD-10-CM | POA: Diagnosis not present

## 2021-04-12 DIAGNOSIS — M533 Sacrococcygeal disorders, not elsewhere classified: Secondary | ICD-10-CM | POA: Diagnosis not present

## 2021-04-12 DIAGNOSIS — Z9884 Bariatric surgery status: Secondary | ICD-10-CM | POA: Insufficient documentation

## 2021-04-12 DIAGNOSIS — M5126 Other intervertebral disc displacement, lumbar region: Secondary | ICD-10-CM | POA: Diagnosis not present

## 2021-04-12 DIAGNOSIS — E538 Deficiency of other specified B group vitamins: Secondary | ICD-10-CM | POA: Diagnosis not present

## 2021-04-12 DIAGNOSIS — D508 Other iron deficiency anemias: Secondary | ICD-10-CM | POA: Diagnosis not present

## 2021-04-12 DIAGNOSIS — G8929 Other chronic pain: Secondary | ICD-10-CM | POA: Diagnosis not present

## 2021-04-12 DIAGNOSIS — D509 Iron deficiency anemia, unspecified: Secondary | ICD-10-CM

## 2021-04-12 MED ORDER — SODIUM CHLORIDE 0.9 % IV SOLN: 200.0000 mg | INTRAVENOUS | Status: DC

## 2021-04-12 MED ORDER — SODIUM CHLORIDE 0.9 % IV SOLN
Freq: Once | INTRAVENOUS | Status: AC
  Filled 2021-04-12: qty 250

## 2021-04-12 MED ORDER — IRON SUCROSE 20 MG/ML IV SOLN
200.0000 mg | Freq: Once | INTRAVENOUS | Status: AC
  Administered 2021-04-12: 200 mg via INTRAVENOUS
  Filled 2021-04-12: qty 10

## 2021-04-12 NOTE — Patient Instructions (Signed)

## 2021-04-15 ENCOUNTER — Inpatient Hospital Stay: Payer: PRIVATE HEALTH INSURANCE | Attending: Oncology

## 2021-04-15 ENCOUNTER — Other Ambulatory Visit: Payer: Self-pay

## 2021-04-15 VITALS — BP 126/82 | HR 68 | Temp 98.9°F | Resp 18

## 2021-04-15 DIAGNOSIS — D508 Other iron deficiency anemias: Secondary | ICD-10-CM | POA: Insufficient documentation

## 2021-04-15 DIAGNOSIS — D509 Iron deficiency anemia, unspecified: Secondary | ICD-10-CM

## 2021-04-15 MED ORDER — IRON SUCROSE 20 MG/ML IV SOLN
200.0000 mg | Freq: Once | INTRAVENOUS | Status: AC
  Administered 2021-04-15: 200 mg via INTRAVENOUS
  Filled 2021-04-15: qty 10

## 2021-04-15 MED ORDER — SODIUM CHLORIDE 0.9 % IV SOLN
Freq: Once | INTRAVENOUS | Status: AC
  Filled 2021-04-15: qty 250

## 2021-04-15 MED ORDER — SODIUM CHLORIDE 0.9 % IV SOLN: 200.0000 mg | INTRAVENOUS | Status: DC

## 2021-04-17 ENCOUNTER — Inpatient Hospital Stay: Payer: PRIVATE HEALTH INSURANCE

## 2021-04-17 ENCOUNTER — Encounter: Payer: Self-pay | Admitting: Oncology

## 2021-04-17 VITALS — BP 154/72 | HR 54 | Temp 96.8°F | Resp 16

## 2021-04-17 DIAGNOSIS — D509 Iron deficiency anemia, unspecified: Secondary | ICD-10-CM

## 2021-04-17 DIAGNOSIS — D508 Other iron deficiency anemias: Secondary | ICD-10-CM | POA: Diagnosis not present

## 2021-04-17 MED ORDER — SODIUM CHLORIDE 0.9 % IV SOLN: 200.0000 mg | INTRAVENOUS | Status: DC

## 2021-04-17 MED ORDER — IRON SUCROSE 20 MG/ML IV SOLN
200.0000 mg | Freq: Once | INTRAVENOUS | Status: AC
  Administered 2021-04-17: 200 mg via INTRAVENOUS
  Filled 2021-04-17: qty 10

## 2021-04-17 MED ORDER — SODIUM CHLORIDE 0.9 % IV SOLN
Freq: Once | INTRAVENOUS | Status: AC
  Filled 2021-04-17: qty 250

## 2021-04-18 ENCOUNTER — Encounter: Payer: Self-pay | Admitting: Oncology

## 2021-04-19 ENCOUNTER — Inpatient Hospital Stay: Payer: PRIVATE HEALTH INSURANCE

## 2021-04-19 VITALS — BP 137/72 | HR 50 | Temp 96.7°F | Resp 20

## 2021-04-19 DIAGNOSIS — D508 Other iron deficiency anemias: Secondary | ICD-10-CM | POA: Diagnosis not present

## 2021-04-19 DIAGNOSIS — D509 Iron deficiency anemia, unspecified: Secondary | ICD-10-CM

## 2021-04-19 MED ORDER — SODIUM CHLORIDE 0.9 % IV SOLN: 200.0000 mg | INTRAVENOUS | Status: DC

## 2021-04-19 MED ORDER — SODIUM CHLORIDE 0.9 % IV SOLN
Freq: Once | INTRAVENOUS | Status: AC
  Filled 2021-04-19: qty 250

## 2021-04-19 MED ORDER — IRON SUCROSE 20 MG/ML IV SOLN
200.0000 mg | Freq: Once | INTRAVENOUS | Status: AC
  Administered 2021-04-19: 200 mg via INTRAVENOUS
  Filled 2021-04-19: qty 10

## 2021-04-19 NOTE — Patient Instructions (Signed)

## 2021-04-22 ENCOUNTER — Other Ambulatory Visit: Payer: Self-pay | Admitting: Family

## 2021-04-22 DIAGNOSIS — F418 Other specified anxiety disorders: Secondary | ICD-10-CM

## 2021-04-22 DIAGNOSIS — R519 Headache, unspecified: Secondary | ICD-10-CM

## 2021-04-23 ENCOUNTER — Other Ambulatory Visit: Payer: Self-pay | Admitting: Family

## 2021-04-23 ENCOUNTER — Ambulatory Visit: Payer: BC Managed Care – PPO | Admitting: Oncology

## 2021-04-23 DIAGNOSIS — G4700 Insomnia, unspecified: Secondary | ICD-10-CM

## 2021-04-23 DIAGNOSIS — F418 Other specified anxiety disorders: Secondary | ICD-10-CM

## 2021-04-25 ENCOUNTER — Inpatient Hospital Stay: Payer: PRIVATE HEALTH INSURANCE

## 2021-04-25 ENCOUNTER — Other Ambulatory Visit: Payer: Self-pay

## 2021-04-25 VITALS — BP 127/74 | HR 55 | Temp 97.0°F | Resp 19

## 2021-04-25 DIAGNOSIS — D508 Other iron deficiency anemias: Secondary | ICD-10-CM | POA: Diagnosis not present

## 2021-04-25 DIAGNOSIS — D509 Iron deficiency anemia, unspecified: Secondary | ICD-10-CM

## 2021-04-25 MED ORDER — SODIUM CHLORIDE 0.9 % IV SOLN
Freq: Once | INTRAVENOUS | Status: AC
  Filled 2021-04-25: qty 250

## 2021-04-25 MED ORDER — IRON SUCROSE 20 MG/ML IV SOLN
200.0000 mg | Freq: Once | INTRAVENOUS | Status: AC
  Administered 2021-04-25: 200 mg via INTRAVENOUS
  Filled 2021-04-25: qty 10

## 2021-04-25 MED ORDER — SODIUM CHLORIDE 0.9 % IV SOLN: 200.0000 mg | INTRAVENOUS | Status: DC

## 2021-04-25 NOTE — Patient Instructions (Signed)
[facility] [place] [facility]  Discharge Instructions: Thank you for choosing [facility] to provide your oncology and hematology care.  If you have a lab appointment with the Cancer Center, please go directly to the Cancer Center and check in at the registration area.  Wear comfortable clothing and clothing appropriate for easy access to any Portacath or PICC line.   We strive to give you quality time with your provider. You may need to reschedule your appointment if you arrive late (15 or more minutes).  Arriving late affects you and other patients whose appointments are after yours.  Also, if you miss three or more appointments without notifying the office, you may be dismissed from the clinic at the provider's discretion.      For prescription refill requests, have your pharmacy contact our office and allow 72 hours for refills to be completed.    Today you received venofer   To help prevent nausea and vomiting after your treatment, we encourage you to take your nausea medication as directed.  BELOW ARE SYMPTOMS THAT SHOULD BE REPORTED IMMEDIATELY: *FEVER GREATER THAN 100.4 F (38 C) OR HIGHER *CHILLS OR SWEATING *NAUSEA AND VOMITING THAT IS NOT CONTROLLED WITH YOUR NAUSEA MEDICATION *UNUSUAL SHORTNESS OF BREATH *UNUSUAL BRUISING OR BLEEDING *URINARY PROBLEMS (pain or burning when urinating, or frequent urination) *BOWEL PROBLEMS (unusual diarrhea, constipation, pain near the anus) TENDERNESS IN MOUTH AND THROAT WITH OR WITHOUT PRESENCE OF ULCERS (sore throat, sores in mouth, or a toothache) UNUSUAL RASH, SWELLING OR PAIN  UNUSUAL VAGINAL DISCHARGE OR ITCHING   Items with * indicate a potential emergency and should be followed up as soon as possible or go to the Emergency Department if any problems should occur.  Please show the CHEMOTHERAPY ALERT CARD or IMMUNOTHERAPY ALERT CARD at check-in to the Emergency Department and triage nurse.  Should you  have questions after your visit or need to cancel or reschedule your appointment, please contact [facility] [place] [facility]  [phone] and follow the prompts.  Office hours are 8:00 a.m. to 4:30 p.m. Monday - Friday. Please note that voicemails left after 4:00 p.m. may not be returned until the following business day.  We are closed weekends and major holidays. You have access to a nurse at all times for urgent questions. Please call the main number to the clinic [phone] and follow the prompts.  For any non-urgent questions, you may also contact your provider using MyChart. We now offer e-Visits for anyone 18 and older to request care online for non-urgent symptoms. For details visit [email or web address].   Also download the MyChart app! Go to the app store, search "MyChart", open the app, select [facility], and log in with your MyChart username and password.  Due to Covid, a mask is required upon entering the hospital/clinic. If you do not have a mask, one will be given to you upon arrival. For doctor visits, patients may have 1 support person aged 18 or older with them. For treatment visits, patients cannot have anyone with them due to current Covid guidelines and our immunocompromised population.  

## 2021-04-27 ENCOUNTER — Other Ambulatory Visit: Payer: Self-pay | Admitting: Family

## 2021-04-27 DIAGNOSIS — F418 Other specified anxiety disorders: Secondary | ICD-10-CM

## 2021-04-27 DIAGNOSIS — R519 Headache, unspecified: Secondary | ICD-10-CM

## 2021-04-29 ENCOUNTER — Ambulatory Visit (INDEPENDENT_AMBULATORY_CARE_PROVIDER_SITE_OTHER): Payer: BC Managed Care – PPO | Admitting: Certified Nurse Midwife

## 2021-04-29 ENCOUNTER — Encounter: Payer: Self-pay | Admitting: Certified Nurse Midwife

## 2021-04-29 ENCOUNTER — Other Ambulatory Visit: Payer: Self-pay

## 2021-04-29 VITALS — BP 148/76 | HR 60 | Ht 64.5 in | Wt 205.2 lb

## 2021-04-29 DIAGNOSIS — Z01419 Encounter for gynecological examination (general) (routine) without abnormal findings: Secondary | ICD-10-CM | POA: Diagnosis not present

## 2021-04-29 DIAGNOSIS — Z1231 Encounter for screening mammogram for malignant neoplasm of breast: Secondary | ICD-10-CM

## 2021-04-29 MED ORDER — ESTRADIOL-NORETHINDRONE ACET 0.5-0.1 MG PO TABS: 1.0000 | ORAL_TABLET | Freq: Every day | ORAL | 11 refills | Status: DC

## 2021-04-29 NOTE — Progress Notes (Addendum)
GYNECOLOGY ANNUAL PREVENTATIVE CARE ENCOUNTER NOTE  History:     [name] is a 55 y.o. G0P0000 female here for a routine annual gynecologic exam.  Current complaints: none.   Denies abnormal vaginal bleeding, discharge, pelvic pain, problems with intercourse or other gynecologic concerns.     Social Relationship: with female partner (married) Living: with her spouse Work: Oceanographer Exercise: walks dogs daily, lots of stairs at work  Smoke/Alcohol/drug AVW:[id number] use  Gynecologic History Patient's last menstrual period was 11/28/2019 (approximate). Contraception: post menopausal status Last Pap: 02/2019. Results were: normal with negative HPV Last mammogram: not sure when she last had done. Results were: normal  Obstetric History OB History  Gravida Para Term Preterm AB Living  0 0 0 0 0 0  SAB IAB Ectopic Multiple Live Births  0 0 0 0 0    Past Medical History:  Diagnosis Date   Anemia    Anxiety and depression    Chronic back pain    DM2 (diabetes mellitus, type 2) (HCC)    improved since gastric bypass   Headache    stress/sinus - 2-3x/wk   History of left shoulder fracture    HLD (hyperlipidemia)    HTN (hypertension)    improved since gastric bypass   Hypothyroid    Left leg weakness    s/p back surgery   Migraines    Obesity    Pernicious anemia    S/P insertion of spinal cord stimulator    Seizure (HCC)    told she had a seizure as young child, not sure of context; mother states she didnt witness   Wears contact lenses     Past Surgical History:  Procedure Laterality Date   Abd [place] - gallstones  5/03   BACK SURGERY  2009   decompressive laminectomy   BARIATRIC SURGERY  03/02/2012   gastric bypass; Dr Smitty Cords   CHOLECYSTECTOMY  6/03   COLONOSCOPY WITH PROPOFOL N/A 05/01/2017   Procedure: COLONOSCOPY WITH PROPOFOL;  Surgeon: Midge Minium, MD;  Location: [facility];  Service: Gastroenterology;  Laterality: N/A;    DILATION AND CURETTAGE OF UTERUS  1/05   ESOPHAGOGASTRODUODENOSCOPY (EGD) WITH PROPOFOL N/A 05/01/2017   Procedure: ESOPHAGOGASTRODUODENOSCOPY (EGD) WITH PROPOFOL;  Surgeon: Midge Minium, MD;  Location: [facility];  Service: Gastroenterology;  Laterality: N/A;   KNEE SURGERY     L - arthroscopic   LIVER BIOPSY  6/03   fatty liver   LUMBAR SPINE SURGERY  2015   Dr. Amalia Greenhouse   NASAL SINUS SURGERY     PANNICULECTOMY  08/2013   [facility], Dr. Diamond Nickel   SHOULDER ARTHROSCOPY Left 10/06/2019   sleep study - apnea  04/2001   improved since gastric bypass    Current Outpatient Medications on File Prior to Visit  Medication Sig Dispense Refill   azelastine (ASTELIN) 0.1 % nasal spray Place 1 spray into both nostrils 2 (two) times daily. Use in each nostril as directed 30 mL 4   cyanocobalamin (,VITAMIN B-12,) 1000 MCG/ML injection Inject 1 mL (1,000 mcg total) into the muscle every 30 (thirty) days. 1 mL 3   Estradiol-Norethindrone Acet 0.5-0.1 MG tablet Take 1 tablet by mouth daily. 28 tablet 2   fentaNYL (DURAGESIC - DOSED MCG/HR) 50 MCG/HR APPLY 1 PATCH(ES) EVERY 72 HOURS BY TRANSDERMAL ROUTE.  0   fentaNYL (DURAGESIC) 25 MCG/HR 1 patch every 3 (three) days.     FLUoxetine (PROZAC) 20 MG capsule TAKE 1 CAPSULE (20  MG TOTAL) BY MOUTH EVERY MORNING FOR 7 DAYS. THEN STOP. 30 capsule 0   folic acid (FOLVITE) 1 MG tablet Take 1 tablet (1 mg total) by mouth daily. 90 tablet 3   levothyroxine (SYNTHROID) 100 MCG tablet TAKE 1 TABLET BY MOUTH EVERY DAY 30 tablet 7   Magnesium Citrate 200 MG TABS Take 400 mg by mouth daily. 120 tablet 1   mometasone (NASONEX) 50 MCG/ACT nasal spray Place 2 sprays into the nose daily.     naloxone (NARCAN) nasal spray 4 mg/0.1 mL Place 1 spray into the nose as needed (in case of too much pain patch in her system).     NEEDLE, DISP, 25 G (B-D DISP NEEDLE 25GX1") 25G X 1" MISC Used to give monlthly B-12 injections. 1 each 5   omeprazole (PRILOSEC) 20 MG capsule Take 1  capsule (20 mg total) by mouth daily. 30 capsule 3   promethazine (PHENERGAN) 12.5 MG tablet Take 1 tablet (12.5 mg total) by mouth every 8 (eight) hours as needed for nausea or vomiting. 20 tablet 0   SYRINGE-NEEDLE, DISP, 3 ML 25G X 1" 3 ML MISC Used to give monthly B-12 injections. 1 each 5   traZODone (DESYREL) 100 MG tablet TAKE 1 TABLET BY MOUTH EVERYDAY AT BEDTIME 30 tablet 8   venlafaxine XR (EFFEXOR XR) 37.5 MG 24 hr capsule Take 1 capsule (37.5 mg total) by mouth daily with breakfast. 90 capsule 1   Atogepant (QULIPTA) 60 MG TABS Take 60 mg by mouth daily. (Patient not taking: No sig reported) 30 tablet 2   No current facility-administered medications on file prior to visit.    Allergies  Allergen Reactions   Latex Rash   Penicillins Itching and Swelling    Has patient had a PCN reaction causing immediate rash, facial/tongue/throat swelling, SOB or lightheadedness with hypotension: yes Has patient had a PCN reaction causing severe rash involving mucus membranes or skin necrosis: no Has patient had a PCN reaction that required hospitalization : yes ed visit Has patient had a PCN reaction occurring within the last 10 years: yes If all of the above answers are "NO", then may proceed with Cephalosporin use.     Social History:  reports that she has never smoked. She has never used smokeless tobacco. She reports that she does not drink alcohol and does not use drugs.  Family History  Problem Relation Age of Onset   Lung cancer Father    Diabetes Mother    Heart failure Mother    Diabetes Sister    Fibromyalgia Sister    Colon cancer Maternal Aunt    Skin cancer Paternal Aunt    Skin cancer Paternal Uncle     The following portions of the patient's history were reviewed and updated as appropriate: allergies, current medications, past family history, past medical history, past social history, past surgical history and problem list.  Review of Systems Pertinent items noted in  HPI and remainder of comprehensive ROS otherwise negative.  Physical Exam:  LMP 11/28/2019 (Approximate)  CONSTITUTIONAL: Well-developed, well-nourished female in no acute distress.  HENT:  Normocephalic, atraumatic, External right and left ear normal. Oropharynx is clear and moist EYES: Conjunctivae and EOM are normal. Pupils are equal, round, and reactive to light. No scleral icterus.  NECK: Normal range of motion, supple, no masses.  Normal thyroid.  SKIN: Skin is warm and dry. No rash noted. Not diaphoretic. No erythema. No pallor. MUSCULOSKELETAL: Normal range of motion. No tenderness.  No  cyanosis, clubbing, or edema.  2+ distal pulses. NEUROLOGIC: Alert and oriented to person, place, and time. Normal reflexes, muscle tone coordination.  PSYCHIATRIC: Normal mood and affect. Normal behavior. Normal judgment and thought content. CARDIOVASCULAR: Normal heart rate noted, regular rhythm RESPIRATORY: Clear to auscultation bilaterally. Effort and breath sounds normal, no problems with respiration noted. BREASTS: Symmetric in size. No masses, tenderness, skin changes, nipple drainage, or lymphadenopathy bilaterally.  ABDOMEN: Soft, no distention noted.  No tenderness, rebound or guarding.  PELVIC: Normal appearing external genitalia and urethral meatus; normal appearing vaginal mucosa and cervix.  No abnormal discharge noted.  Normal atrophic changes. Pap smear not due.  Normal uterine size, no other palpable masses, no uterine or adnexal tenderness.  .   Assessment and Plan:    1. Women's annual routine gynecological examination   Pap: not due Mammogram : ordered Labs: none due Refills:HRT : Estradiol-Norethindrone Acet 0.5-0.1 MG tablet Referral:none Routine preventative health maintenance measures emphasized. Please refer to After Visit Summary for other counseling recommendations.      Doreene Burke, CNM [facility],  [facility]

## 2021-04-29 NOTE — Patient Instructions (Signed)
Preventive Care 68-55 Years Old, Female Preventive care refers to lifestyle choices and visits with your health care provider that can promote health and wellness. This includes: A yearly physical exam. This is also called an annual wellness visit. Regular dental and eye exams. Immunizations. Screening for certain conditions. Healthy lifestyle choices, such as: Eating a healthy diet. Getting regular exercise. Not using drugs or products that contain nicotine and tobacco. Limiting alcohol use. What can I expect for my preventive care visit? Physical exam Your health care provider will check your: Height and weight. These may be used to calculate your BMI (body mass index). BMI is a measurement that tells if you are at a healthy weight. Heart rate and blood pressure. Body temperature. Skin for abnormal spots. Counseling Your health care provider may ask you questions about your: Past medical problems. Family's medical history. Alcohol, tobacco, and drug use. Emotional well-being. Home life and relationship well-being. Sexual activity. Diet, exercise, and sleep habits. Work and work Statistician. Access to firearms. Method of birth control. Menstrual cycle. Pregnancy history. What immunizations do I need?  Vaccines are usually given at various ages, according to a schedule. Your health care provider will recommend vaccines for you based on your age, medicalhistory, and lifestyle or other factors, such as travel or where you work. What tests do I need? Blood tests Lipid and cholesterol levels. These may be checked every 5 years, or more often if you are over 55 years old. Hepatitis C test. Hepatitis B test. Screening Lung cancer screening. You may have this screening every year starting at age 55 if you have a 30-pack-year history of smoking and currently smoke or have quit within the past 15 years. Colorectal cancer screening. All adults should have this screening starting at  age 55 and continuing until age 55. Your health care provider may recommend screening at age 55 if you are at increased risk. You will have tests every 1-10 years, depending on your results and the type of screening test. Diabetes screening. This is done by checking your blood sugar (glucose) after you have not eaten for a while (fasting). You may have this done every 1-3 years. Mammogram. This may be done every 1-2 years. Talk with your health care provider about when you should start having regular mammograms. This may depend on whether you have a family history of breast cancer. BRCA-related cancer screening. This may be done if you have a family history of breast, ovarian, tubal, or peritoneal cancers. Pelvic exam and Pap test. This may be done every 3 years starting at age 55. Starting at age 54, this may be done every 5 years if you have a Pap test in combination with an HPV test. Other tests STD (sexually transmitted disease) testing, if you are at risk. Bone density scan. This is done to screen for osteoporosis. You may have this scan if you are at high risk for osteoporosis. Talk with your health care provider about your test results, treatment options,and if necessary, the need for more tests. Follow these instructions at home: Eating and drinking  Eat a diet that includes fresh fruits and vegetables, whole grains, lean protein, and low-fat dairy products. Take vitamin and mineral supplements as recommended by your health care provider. Do not drink alcohol if: Your health care provider tells you not to drink. You are pregnant, may be pregnant, or are planning to become pregnant. If you drink alcohol: Limit how much you have to 0-1 drink a day. Be aware  of how much alcohol is in your drink. In the [place] one drink equals one 12 oz bottle of beer (355 mL), one 5 oz glass of wine (148 mL), or one 1 oz glass of hard liquor (44 mL).  Lifestyle Take daily care of your teeth and  gums. Brush your teeth every morning and night with fluoride toothpaste. Floss one time each day. Stay active. Exercise for at least 30 minutes 5 or more days each week. Do not use any products that contain nicotine or tobacco, such as cigarettes, e-cigarettes, and chewing tobacco. If you need help quitting, ask your health care provider. Do not use drugs. If you are sexually active, practice safe sex. Use a condom or other form of protection to prevent STIs (sexually transmitted infections). If you do not wish to become pregnant, use a form of birth control. If you plan to become pregnant, see your health care provider for a prepregnancy visit. If told by your health care provider, take low-dose aspirin daily starting at age 29. Find healthy ways to cope with stress, such as: Meditation, yoga, or listening to music. Journaling. Talking to a trusted person. Spending time with friends and family. Safety Always wear your seat belt while driving or riding in a vehicle. Do not drive: If you have been drinking alcohol. Do not ride with someone who has been drinking. When you are tired or distracted. While texting. Wear a helmet and other protective equipment during sports activities. If you have firearms in your house, make sure you follow all gun safety procedures. What's next? Visit your health care provider once a year for an annual wellness visit. Ask your health care provider how often you should have your eyes and teeth checked. Stay up to date on all vaccines. This information is not intended to replace advice given to you by your health care provider. Make sure you discuss any questions you have with your healthcare provider. Document Revised: 06/05/2020 Document Reviewed: 05/13/2018 Elsevier Patient Education  2022 [employer].

## 2021-05-02 ENCOUNTER — Other Ambulatory Visit: Payer: Self-pay

## 2021-05-02 ENCOUNTER — Ambulatory Visit (INDEPENDENT_AMBULATORY_CARE_PROVIDER_SITE_OTHER): Payer: BC Managed Care – PPO | Admitting: Psychologist

## 2021-05-02 DIAGNOSIS — F320 Major depressive disorder, single episode, mild: Secondary | ICD-10-CM

## 2021-05-02 DIAGNOSIS — Z634 Disappearance and death of family member: Secondary | ICD-10-CM

## 2021-05-08 ENCOUNTER — Ambulatory Visit: Payer: PRIVATE HEALTH INSURANCE | Admitting: Psychologist

## 2021-05-18 ENCOUNTER — Other Ambulatory Visit: Payer: Self-pay | Admitting: Family

## 2021-05-18 DIAGNOSIS — F418 Other specified anxiety disorders: Secondary | ICD-10-CM

## 2021-05-18 DIAGNOSIS — G4700 Insomnia, unspecified: Secondary | ICD-10-CM

## 2021-05-21 NOTE — Telephone Encounter (Signed)
Please advise, pharmacy requesting to increase to 90 day supply

## 2021-06-18 NOTE — Progress Notes (Signed)
Virtual Visit via Video Note The purpose of this virtual visit is to provide medical care while limiting exposure to the novel coronavirus.    Consent was obtained for video visit:  Yes.   Answered questions that patient had about telehealth interaction:  Yes.   I discussed the limitations, risks, security and privacy concerns of performing an evaluation and management service by telemedicine. I also discussed with the patient that there may be a patient responsible charge related to this service. The patient expressed understanding and agreed to proceed.  Pt location: Home Physician Location: office Name of referring provider:  Allegra Grana, FNP I connected with [name] at patients initiation/request on 06/19/2021 at 11:10 AM EDT by video enabled telemedicine application and verified that I am speaking with the correct person using two identifiers. Pt MRN:  [id number] Pt DOB:  08/12/1966 Video Participants:  [name]  Assessment and Plan:   Chronic migraine without aura, without status migrainosus, not intractable  Migraine prevention:  start Emgality every 28 days Migraine rescue:  rizatriptan 10mg .  Stop all over the counter analgesics Limit use of pain relievers to no more than 2 days out of week to prevent risk of rebound or medication-overuse headache. Keep headache diary Follow up 6 months.   History of Present Illness:  [name] is a 55 year old right-handed Caucasian female with type 2 diabetes, chronic back pain, depression and anxiety and history of gastric bypass surgery who follows up for chronic migraine.  UPDATE: Last seen in June 2021.  Started Aimovig 70mg  at that time.  She was doing well but beginning in 2022, her insurance would no longer cover Aimovig and was told that her insurance company was not covering injectables.  She was started on in late February. Headaches got worse and became frequent again. Intensity:   usually moderate, sometimes severe Duration:  significantly improves after 2 hours Frequency:  4-5 days a week Frequency of abortive medication: takes Tylenol or Excedrin 4 to 5 days a week. Current NSAIDS:  none Current analgesics:  Fentanyl patch (chronic back pain) Percocet (back pain), Excedrin, Tylenol Current triptans:  none Current ergotamine:  none Current anti-emetic:  promethazine 12.5mg  Current muscle relaxants:  Robaxin Current anti-anxiolytic:  none Current sleep aide:  trazodone Current Antihypertensive medications:  none Current Antidepressant medications:  venlafaxine XR 37.5mg  daily, trazodone Current Anticonvulsant medications:  Gabapentin 800mg  TID Current anti-CGRP:  Quliipta 60mg  daily Current Vitamins/Herbal/Supplements: none Current Antihistamines/Decongestants:  none Other therapy:  none Hormone/birth control:  none  Caffeine:  Rarely drinks coffee.  No caffeinated soda. Diet:  Drinks a lot of water.  Drinks protein drink for breakfast and lunch.  Sometimes has decreased appetite since gastric bypass in 2013. Exercise:  Not routine (limited due to back pain) Depression: yes; Anxiety:  no Other pain:  Chronic back pain.  Sometimes neck pain. Sleep:  Good (due to medications)  HISTORY:  Onset:   After a fall in April 2020, hitting her head.  Location:  Often starts in the temples and may move across forehead or back of head Quality:  Starts as pressure and progresses to a sharp pain Initial Intensity:  Usually moderate, sometimes severe Aura:  none Premonitory Phase:  none Postdrome:  feels like she is in a fog Associated symptoms:  Occasional nausea, photophobia, phonophobia.  She denies associated visual disturbance, unilateral numbness or weakness. Initial Duration:  30 to 45 minutes Initial Frequency:  Daily, sometimes more than  once a day Initial Frequency of abortive medication: 5 out of 7 days a week.  Excedrin at least twice a week, otherwise may  take Tylenol Triggers:  no Relieving factors:  Excedrin Activity:  Aggravates   Previous history of different headaches, migraines that would put her in bed, with nausea, photophobia, phonophobia     Past NSAIDS:  naproxen Past analgesics:  none Past abortive triptans:  none Past abortive ergotamine:  none Past muscle relaxants:  none Past anti-emetic:  Zofran ODT 4mg  Past antihypertensive medications:  propranolol Past antidepressant medications:  Cymbalta; citalopram, fluoxetine, Wellbutrin Past anticonvulsant medications:  Lyrica Past anti-CGRP:  Aimovig 70mg  (effective), Qulipta 60mg  Past vitamins/Herbal/Supplements:  magnesium citrate Past antihistamines/decongestants:  meclizine Other past therapies:  none    Family history of headache:  No   Imaging (personally reviewed): 01/02/2019 CT HEAD WO:  Stable right frontal encephalomalacia but no acute intracranial abnormality. 09/21/2018 CTA HEAD & NECK:  Negative  Past Medical History: Past Medical History:  Diagnosis Date   Anemia    Anxiety and depression    Chronic back pain    DM2 (diabetes mellitus, type 2) (HCC)    improved since gastric bypass   Headache    stress/sinus - 2-3x/wk   History of left shoulder fracture    HLD (hyperlipidemia)    HTN (hypertension)    improved since gastric bypass   Hypothyroid    Left leg weakness    s/p back surgery   Migraines    Obesity    Pernicious anemia    S/P insertion of spinal cord stimulator    Seizure (HCC)    told she had a seizure as young child, not sure of context; mother states she didnt witness   Wears contact lenses     Medications: Outpatient Encounter Medications as of 06/19/2021  Medication Sig   Atogepant (QULIPTA) 60 MG TABS Take 60 mg by mouth daily. (Patient not taking: No sig reported)   azelastine (ASTELIN) 0.1 % nasal spray Place 1 spray into both nostrils 2 (two) times daily. Use in each nostril as directed (Patient not taking: Reported on  04/29/2021)   cyanocobalamin (,VITAMIN B-12,) 1000 MCG/ML injection Inject 1 mL (1,000 mcg total) into the muscle every 30 (thirty) days. (Patient not taking: Reported on 04/29/2021)   Estradiol-Norethindrone Acet 0.5-0.1 MG tablet Take 1 tablet by mouth daily.   fentaNYL (DURAGESIC - DOSED MCG/HR) 50 MCG/HR APPLY 1 PATCH(ES) EVERY 72 HOURS BY TRANSDERMAL ROUTE. (Patient not taking: Reported on 04/29/2021)   fentaNYL (DURAGESIC) 25 MCG/HR 1 patch every 3 (three) days.   FLUoxetine (PROZAC) 20 MG capsule TAKE 1 CAPSULE (20 MG TOTAL) BY MOUTH EVERY MORNING FOR 7 DAYS. THEN STOP. (Patient not taking: Reported on 04/29/2021)   folic acid (FOLVITE) 1 MG tablet Take 1 tablet (1 mg total) by mouth daily. (Patient not taking: Reported on 04/29/2021)   levothyroxine (SYNTHROID) 100 MCG tablet TAKE 1 TABLET BY MOUTH EVERY DAY (Patient not taking: Reported on 04/29/2021)   Magnesium Citrate 200 MG TABS Take 400 mg by mouth daily. (Patient not taking: Reported on 04/29/2021)   methocarbamol (ROBAXIN) 500 MG tablet Take 500 mg by mouth 4 (four) times daily.   mometasone (NASONEX) 50 MCG/ACT nasal spray Place 2 sprays into the nose daily. (Patient not taking: Reported on 04/29/2021)   naloxone [facility]) nasal spray 4 mg/0.1 mL Place 1 spray into the nose as needed (in case of too much pain patch in her system).   NEEDLE,  DISP, 25 G (B-D DISP NEEDLE 25GX1") 25G X 1" MISC Used to give monlthly B-12 injections.   omeprazole (PRILOSEC) 20 MG capsule Take 1 capsule (20 mg total) by mouth daily. (Patient not taking: Reported on 04/29/2021)   promethazine (PHENERGAN) 12.5 MG tablet Take 1 tablet (12.5 mg total) by mouth every 8 (eight) hours as needed for nausea or vomiting.   SYRINGE-NEEDLE, DISP, 3 ML 25G X 1" 3 ML MISC Used to give monthly B-12 injections.   traZODone (DESYREL) 100 MG tablet TAKE 1 TABLET BY MOUTH EVERYDAY AT BEDTIME   venlafaxine XR (EFFEXOR XR) 37.5 MG 24 hr capsule Take 1 capsule (37.5 mg total) by mouth  daily with breakfast.   No facility-administered encounter medications on file as of 06/19/2021.    Allergies: Allergies  Allergen Reactions   Latex Rash   Penicillins Itching and Swelling    Has patient had a PCN reaction causing immediate rash, facial/tongue/throat swelling, SOB or lightheadedness with hypotension: yes Has patient had a PCN reaction causing severe rash involving mucus membranes or skin necrosis: no Has patient had a PCN reaction that required hospitalization : yes ed visit Has patient had a PCN reaction occurring within the last 10 years: yes If all of the above answers are "NO", then may proceed with Cephalosporin use.     Family History: Family History  Problem Relation Age of Onset   Lung cancer Father    Diabetes Mother    Heart failure Mother    Diabetes Sister    Fibromyalgia Sister    Colon cancer Maternal Aunt    Skin cancer Paternal Aunt    Skin cancer Paternal Uncle     Observations/Objective:   Height 5' 4.5" (1.638 m), weight 195 lb (88.5 kg), last menstrual period 11/28/2019. No acute distress.  Alert and oriented.  Speech fluent and not dysarthric.  Language intact.   Follow Up Instructions:    -I discussed the assessment and treatment plan with the patient. The patient was provided an opportunity to ask questions and all were answered. The patient agreed with the plan and demonstrated an understanding of the instructions.   The patient was advised to call back or seek an in-person evaluation if the symptoms worsen or if the condition fails to improve as anticipated.   Cira Servant, DO

## 2021-06-19 ENCOUNTER — Telehealth (INDEPENDENT_AMBULATORY_CARE_PROVIDER_SITE_OTHER): Payer: PRIVATE HEALTH INSURANCE | Admitting: Neurology

## 2021-06-19 ENCOUNTER — Encounter: Payer: Self-pay | Admitting: Neurology

## 2021-06-19 ENCOUNTER — Other Ambulatory Visit: Payer: Self-pay

## 2021-06-19 VITALS — Ht 64.5 in | Wt 195.0 lb

## 2021-06-19 DIAGNOSIS — G43709 Chronic migraine without aura, not intractable, without status migrainosus: Secondary | ICD-10-CM | POA: Diagnosis not present

## 2021-06-19 MED ORDER — EMGALITY 120 MG/ML ~~LOC~~ SOAJ: 120.0000 mg | SUBCUTANEOUS | 5 refills | Status: DC

## 2021-06-19 MED ORDER — EMGALITY 120 MG/ML ~~LOC~~ SOAJ: 240.0000 mg | Freq: Once | SUBCUTANEOUS | 0 refills | Status: AC

## 2021-06-19 MED ORDER — RIZATRIPTAN BENZOATE 10 MG PO TBDP: ORAL_TABLET | ORAL | 5 refills | Status: DC

## 2021-06-19 NOTE — Patient Instructions (Signed)
  Start Emality - 2 injections for first dose, then 1 injection every 28 days thereafter STOP all over the counter pain relievers (Tylenol, Excedrin, etc) Take rizatriptan at earliest onset of headache.  May repeat dose once in 2 hours if needed.  Maximum 2 tablets in 24 hours. Limit use of rizatriptan to no more than 2 days out of the week.  These medications include acetaminophen, NSAIDs (ibuprofen/Advil/Motrin, naproxen/Aleve, triptans (Imitrex/sumatriptan), Excedrin, and narcotics.  This will help reduce risk of rebound headaches. Be aware of common food triggers:  - Caffeine:  coffee, black tea, cola, Mt. Dew  - Chocolate  - Dairy:  aged cheeses (brie, blue, cheddar, gouda, [place], provolone, [place], Swiss, etc), chocolate milk, buttermilk, sour cream, limit eggs and yogurt  - Nuts, peanut butter  - Alcohol  - Cereals/grains:  FRESH breads (fresh bagels, sourdough, doughnuts), yeast productions  - Processed/canned/aged/cured meats (pre-packaged deli meats, hotdogs)  - MSG/glutamate:  soy sauce, flavor enhancer, pickled/preserved/marinated foods  - Sweeteners:  aspartame (Equal, Nutrasweet).  Sugar and Splenda are okay  - Vegetables:  legumes (lima beans, lentils, snow peas, fava beans, pinto peans, peas, garbanzo beans), sauerkraut, onions, olives, pickles  - Fruit:  avocados, bananas, citrus fruit (orange, lemon, grapefruit), mango  - Other:  Frozen meals, macaroni and cheese Routine exercise Stay adequately hydrated (aim for 64 oz water daily) Keep headache diary Maintain proper stress management Maintain proper sleep hygiene Do not skip meals Consider supplements:  magnesium citrate 400mg  daily, riboflavin 400mg  daily, coenzyme Q10 100mg  three times daily.

## 2021-06-27 ENCOUNTER — Other Ambulatory Visit: Payer: Self-pay | Admitting: Family

## 2021-06-27 DIAGNOSIS — R519 Headache, unspecified: Secondary | ICD-10-CM

## 2021-07-04 ENCOUNTER — Other Ambulatory Visit: Payer: Self-pay

## 2021-07-04 DIAGNOSIS — D509 Iron deficiency anemia, unspecified: Secondary | ICD-10-CM

## 2021-07-08 ENCOUNTER — Other Ambulatory Visit: Payer: Self-pay

## 2021-07-08 ENCOUNTER — Inpatient Hospital Stay: Payer: PRIVATE HEALTH INSURANCE | Attending: Oncology

## 2021-07-08 ENCOUNTER — Inpatient Hospital Stay (HOSPITAL_BASED_OUTPATIENT_CLINIC_OR_DEPARTMENT_OTHER): Payer: PRIVATE HEALTH INSURANCE | Admitting: Nurse Practitioner

## 2021-07-08 ENCOUNTER — Encounter: Payer: Self-pay | Admitting: Nurse Practitioner

## 2021-07-08 VITALS — BP 125/73 | HR 55 | Temp 98.0°F | Resp 16 | Wt 205.5 lb

## 2021-07-08 DIAGNOSIS — Z9884 Bariatric surgery status: Secondary | ICD-10-CM | POA: Insufficient documentation

## 2021-07-08 DIAGNOSIS — D509 Iron deficiency anemia, unspecified: Secondary | ICD-10-CM

## 2021-07-08 DIAGNOSIS — E538 Deficiency of other specified B group vitamins: Secondary | ICD-10-CM

## 2021-07-08 DIAGNOSIS — D508 Other iron deficiency anemias: Secondary | ICD-10-CM | POA: Diagnosis not present

## 2021-07-08 DIAGNOSIS — R748 Abnormal levels of other serum enzymes: Secondary | ICD-10-CM | POA: Diagnosis not present

## 2021-07-08 LAB — FERRITIN: Ferritin: 83 ng/mL (ref 11–307)

## 2021-07-08 LAB — CBC WITH DIFFERENTIAL/PLATELET
Abs Immature Granulocytes: 0 10*3/uL (ref 0.00–0.07)
Basophils Absolute: 0 10*3/uL (ref 0.0–0.1)
Basophils Relative: 0 %
Eosinophils Absolute: 0.1 10*3/uL (ref 0.0–0.5)
Eosinophils Relative: 2 %
HCT: 38.5 % (ref 36.0–46.0)
Hemoglobin: 12.5 g/dL (ref 12.0–15.0)
Immature Granulocytes: 0 %
Lymphocytes Relative: 29 %
Lymphs Abs: 2 10*3/uL (ref 0.7–4.0)
MCH: 29.6 pg (ref 26.0–34.0)
MCHC: 32.5 g/dL (ref 30.0–36.0)
MCV: 91.2 fL (ref 80.0–100.0)
Monocytes Absolute: 0.4 10*3/uL (ref 0.1–1.0)
Monocytes Relative: 6 %
Neutro Abs: 4.2 10*3/uL (ref 1.7–7.7)
Neutrophils Relative %: 63 %
Platelets: 216 10*3/uL (ref 150–400)
RBC: 4.22 MIL/uL (ref 3.87–5.11)
RDW: 17.7 % — ABNORMAL HIGH (ref 11.5–15.5)
WBC: 6.7 10*3/uL (ref 4.0–10.5)
nRBC: 0 % (ref 0.0–0.2)

## 2021-07-08 LAB — IRON AND TIBC
Iron: 98 ug/dL (ref 28–170)
Saturation Ratios: 27 % (ref 10.4–31.8)
TIBC: 361 ug/dL (ref 250–450)
UIBC: 263 ug/dL

## 2021-07-08 LAB — VITAMIN B12: Vitamin B-12: 244 pg/mL (ref 180–914)

## 2021-07-08 NOTE — Progress Notes (Signed)
Patient left without being seen. Will have scheduling contact her to reschedule.

## 2021-07-08 NOTE — Progress Notes (Signed)
Patient denies new problems/concerns today.   °

## 2021-07-11 ENCOUNTER — Inpatient Hospital Stay (HOSPITAL_BASED_OUTPATIENT_CLINIC_OR_DEPARTMENT_OTHER): Payer: PRIVATE HEALTH INSURANCE | Admitting: Nurse Practitioner

## 2021-07-11 DIAGNOSIS — D509 Iron deficiency anemia, unspecified: Secondary | ICD-10-CM

## 2021-07-11 DIAGNOSIS — E538 Deficiency of other specified B group vitamins: Secondary | ICD-10-CM | POA: Diagnosis not present

## 2021-07-11 MED ORDER — CYANOCOBALAMIN 1000 MCG/ML IJ SOLN: 1000.0000 ug | INTRAMUSCULAR | 5 refills | Status: DC

## 2021-07-11 MED ORDER — "SYRINGE/NEEDLE (DISP) 25G X 1"" 3 ML MISC": 5 refills | Status: DC

## 2021-07-11 NOTE — Progress Notes (Signed)
Hematology/Oncology Consult note [facility] Telephone:([phone] [phone]  [name] 1966/06/25  I connected with [name] on 07/11/21 at  2:30 PM EDT by video enabled telemedicine visit and verified that I am speaking with the correct person using two identifiers.   I discussed the limitations, risks, security and privacy concerns of performing an evaluation and management service by telemedicine and the availability of in-person appointments. I also discussed with the patient that there may be a patient responsible charge related to this service. The patient expressed understanding and agreed to proceed.  Other persons participating in the visit and their role in the encounter:  none  Patient's location:  home Provider's location:  work  Risk analyst Complaint: Follow up for elevated alk phos, IDA, b12 deficiency  History of present illness: Patient presented as 55 year old female with history of iron deficiency and B12 deficiency anemia status post gastric bypass who last saw me in October 2018 and received IV iron at that time.  She subsequently did not follow-up.  She has been referred to me for elevated alkaline phosphatase level.  She currently sees Dr. Karis Rilling Norris and was noted to have isolated elevated alkaline phosphatase.  GGT is normal. She was noted to have a low vitamin D level of 19 in May 2020.  PTH and calcium levels were normal.  Fractionated alkaline phosphatase showed normal bone intestinal and liver fraction  Interval history: Patient presents for discussion of lab results and follow up for iron deficiency, b12, and elevated alk phos. She feels fatigued but overall well. Has been working a lot lately and accounted her symptoms to that. Has not been taking b12 injections since 2021. Denies any neurologic complaints. Denies recent fevers or illnesses. Denies any easy bleeding or bruising. No melena or hematochezia. No pica or restless leg.  Reports good appetite and denies weight loss. Denies chest pain. Denies any nausea, vomiting, constipation, or diarrhea. Denies urinary complaints. Patient offers no further specific complaints today.  Review of Systems  Constitutional:  Negative for chills, fever, malaise/fatigue and weight loss.  HENT:  Negative for congestion, ear discharge and nosebleeds.   Eyes:  Negative for blurred vision.  Respiratory:  Negative for cough, hemoptysis, sputum production, shortness of breath and wheezing.   Cardiovascular:  Negative for chest pain, palpitations, orthopnea and claudication.  Gastrointestinal:  Negative for abdominal pain, blood in stool, constipation, diarrhea, heartburn, melena, nausea and vomiting.  Genitourinary:  Negative for dysuria, flank pain, frequency, hematuria and urgency.  Musculoskeletal:  Negative for back pain, joint pain and myalgias.  Skin:  Negative for rash.  Neurological:  Negative for dizziness, tingling, focal weakness, seizures, weakness and headaches.  Endo/Heme/Allergies:  Does not bruise/bleed easily.  Psychiatric/Behavioral:  Negative for depression and suicidal ideas. The patient does not have insomnia.    Allergies  Allergen Reactions   Latex Rash   Penicillins Itching and Swelling    Has patient had a PCN reaction causing immediate rash, facial/tongue/throat swelling, SOB or lightheadedness with hypotension: yes Has patient had a PCN reaction causing severe rash involving mucus membranes or skin necrosis: no Has patient had a PCN reaction that required hospitalization : yes ed visit Has patient had a PCN reaction occurring within the last 10 years: yes If all of the above answers are "NO", then may proceed with Cephalosporin use.     Past Medical History:  Diagnosis Date   Anemia    Anxiety and depression    Chronic back pain  DM2 (diabetes mellitus, type 2) ([place])    improved since gastric bypass   Headache    stress/sinus - 2-3x/wk   History  of left shoulder fracture    HLD (hyperlipidemia)    HTN (hypertension)    improved since gastric bypass   Hypothyroid    Left leg weakness    s/p back surgery   Migraines    Obesity    Pernicious anemia    S/P insertion of spinal cord stimulator    Seizure ([place])    told she had a seizure as young child, not sure of context; mother states she didnt witness   Wears contact lenses     Past Surgical History:  Procedure Laterality Date   Abd [place] - gallstones  5/03   BACK SURGERY  2009   decompressive laminectomy   BARIATRIC SURGERY  03/02/2012   gastric bypass; Dr Darnell Level   CHOLECYSTECTOMY  6/03   COLONOSCOPY WITH PROPOFOL N/A 05/01/2017   Procedure: COLONOSCOPY WITH PROPOFOL;  Surgeon: Lucilla Lame, MD;  Location: [facility];  Service: Gastroenterology;  Laterality: N/A;   DILATION AND CURETTAGE OF UTERUS  1/05   ESOPHAGOGASTRODUODENOSCOPY (EGD) WITH PROPOFOL N/A 05/01/2017   Procedure: ESOPHAGOGASTRODUODENOSCOPY (EGD) WITH PROPOFOL;  Surgeon: Lucilla Lame, MD;  Location: [facility];  Service: Gastroenterology;  Laterality: N/A;   KNEE SURGERY     L - arthroscopic   LIVER BIOPSY  6/03   fatty liver   LUMBAR SPINE SURGERY  2015   Dr. Mick Sell   NASAL SINUS SURGERY     PANNICULECTOMY  08/2013   [facility], Dr. Chrisandra Carota   SHOULDER ARTHROSCOPY Left 10/06/2019   sleep study - apnea  04/2001   improved since gastric bypass    Social History   Socioeconomic History   Marital status: Married    Spouse name: Not on file   Number of children: Not on file   Years of education: Not on file   Highest education level: Not on file  Occupational History   Not on file  Tobacco Use   Smoking status: Never   Smokeless tobacco: Never   Tobacco comments:    tobacco use - no  Vaping Use   Vaping Use: Never used  Substance and Sexual Activity   Alcohol use: No   Drug use: No   Sexual activity: Not Currently    Birth control/protection: None  Other Topics Concern   Not on  file  Social History Narrative   Partner- Shirlean Mylar, no children; works in [place], does not get regular exercise.    Right handed   One story home   No caffeine   Social Determinants of Health   Financial Resource Strain: Not on file  Food Insecurity: Not on file  Transportation Needs: Not on file  Physical Activity: Not on file  Stress: Not on file  Social Connections: Not on file  Intimate Partner Violence: Not on file    Family History  Problem Relation Age of Onset   Lung cancer Father    Diabetes Mother    Heart failure Mother    Diabetes Sister    Fibromyalgia Sister    Colon cancer Maternal Aunt    Skin cancer Paternal Aunt    Skin cancer Paternal Uncle     Current Outpatient Medications:    azelastine (ASTELIN) 0.1 % nasal spray, Place 1 spray into both nostrils 2 (two) times daily. Use in each nostril as directed (Patient not taking: No sig reported), Disp:  30 mL, Rfl: 4   cyanocobalamin (,VITAMIN B-12,) 1000 MCG/ML injection, Inject 1 mL (1,000 mcg total) into the muscle every 30 (thirty) days. (Patient not taking: No sig reported), Disp: 1 mL, Rfl: 3   Estradiol-Norethindrone Acet 0.5-0.1 MG tablet, Take 1 tablet by mouth daily., Disp: 28 tablet, Rfl: 11   fentaNYL (DURAGESIC - DOSED MCG/HR) 50 MCG/HR, APPLY 1 PATCH(ES) EVERY 72 HOURS BY TRANSDERMAL ROUTE. (Patient not taking: No sig reported), Disp: , Rfl: 0   fentaNYL (DURAGESIC) 25 MCG/HR, 1 patch every 3 (three) days., Disp: , Rfl:    FLUoxetine (PROZAC) 20 MG capsule, TAKE 1 CAPSULE (20 MG TOTAL) BY MOUTH EVERY MORNING FOR 7 DAYS. THEN STOP. (Patient not taking: Reported on 04/29/2021), Disp: 30 capsule, Rfl: 0   folic acid (FOLVITE) 1 MG tablet, Take 1 tablet (1 mg total) by mouth daily. (Patient not taking: No sig reported), Disp: 90 tablet, Rfl: 3   Galcanezumab-gnlm (EMGALITY) 120 MG/ML SOAJ, Inject 120 mg into the skin every 28 (twenty-eight) days. (Patient not taking: Reported on 07/08/2021), Disp: 1.12 mL, Rfl:  5   levothyroxine (SYNTHROID) 100 MCG tablet, TAKE 1 TABLET BY MOUTH EVERY DAY (Patient not taking: No sig reported), Disp: 30 tablet, Rfl: 7   Magnesium Citrate 200 MG TABS, Take 400 mg by mouth daily. (Patient not taking: No sig reported), Disp: 120 tablet, Rfl: 1   methocarbamol (ROBAXIN) 500 MG tablet, Take 500 mg by mouth 4 (four) times daily. (Patient not taking: No sig reported), Disp: , Rfl:    mometasone (NASONEX) 50 MCG/ACT nasal spray, Place 2 sprays into the nose daily. (Patient not taking: No sig reported), Disp: , Rfl:    naloxone (NARCAN) nasal spray 4 mg/0.1 mL, Place 1 spray into the nose as needed (in case of too much pain patch in her system)., Disp: , Rfl:    NEEDLE, DISP, 25 G (B-D DISP NEEDLE 25GX1") 25G X 1" MISC, Used to give monlthly B-12 injections. (Patient not taking: No sig reported), Disp: 1 each, Rfl: 5   omeprazole (PRILOSEC) 20 MG capsule, Take 1 capsule (20 mg total) by mouth daily. (Patient not taking: No sig reported), Disp: 30 capsule, Rfl: 3   promethazine (PHENERGAN) 12.5 MG tablet, TAKE 1 TABLET (12.5 MG TOTAL) BY MOUTH EVERY 8 (EIGHT) HOURS AS NEEDED FOR NAUSEA OR VOMITING., Disp: 20 tablet, Rfl: 0   rizatriptan (MAXALT-MLT) 10 MG disintegrating tablet, Take 1 tablet earliest onset of migraine.  May repeat in 2 hours if needed.  Maximum 2 tablets in 24 hours., Disp: 10 tablet, Rfl: 5   SYRINGE-NEEDLE, DISP, 3 ML 25G X 1" 3 ML MISC, Used to give monthly B-12 injections. (Patient not taking: Reported on 07/08/2021), Disp: 1 each, Rfl: 5   traZODone (DESYREL) 100 MG tablet, TAKE 1 TABLET BY MOUTH EVERYDAY AT BEDTIME, Disp: 90 tablet, Rfl: 3   venlafaxine XR (EFFEXOR XR) 37.5 MG 24 hr capsule, Take 1 capsule (37.5 mg total) by mouth daily with breakfast., Disp: 90 capsule, Rfl: 1  No results found.  CMP Latest Ref Rng & Units 04/03/2021  Glucose 70 - 99 mg/dL 93  BUN 6 - 23 mg/dL 14  Creatinine 0.40 - 1.20 mg/dL 0.71  Sodium 135 - 145 mEq/L 144  Potassium 3.5  - 5.1 mEq/L 3.6  Chloride 96 - 112 mEq/L 107  CO2 19 - 32 mEq/L 30  Calcium 8.4 - 10.5 mg/dL 8.6  Total Protein 6.0 - 8.3 g/dL 6.2  Total Bilirubin 0.2 -  1.2 mg/dL 0.4  Alkaline Phos 39 - 117 U/L 79  AST 0 - 37 U/L 18  ALT 0 - 35 U/L 16   CBC Latest Ref Rng & Units 07/08/2021  WBC 4.0 - 10.5 K/uL 6.7  Hemoglobin 12.0 - 15.0 g/dL 12.5  Hematocrit 36.0 - 46.0 % 38.5  Platelets 150 - 400 K/uL 216    Observation/objective: Physical Exam Constitutional:      General: She is not in acute distress. HENT:     Head: Normocephalic.  Pulmonary:     Effort: No respiratory distress.  Neurological:     Mental Status: She is alert and oriented to person, place, and time.  Psychiatric:        Mood and Affect: Mood normal.        Behavior: Behavior normal.    CBC EXTENDED Latest Ref Rng & Units 07/08/2021 04/03/2021 05/09/2020  WBC 4.0 - 10.5 K/uL 6.7 5.8 4.7  RBC 3.87 - 5.11 MIL/uL 4.22 3.55(L) 3.76(L)  HGB 12.0 - 15.0 g/dL 12.5 9.8(L) 11.6(L)  HCT 36.0 - 46.0 % 38.5 29.9(L) 36.9  PLT 150 - 400 K/uL 216 206.0 195  NEUTROABS 1.7 - 7.7 K/uL 4.2 3.7 -  LYMPHSABS 0.7 - 4.0 K/uL 2.0 1.5 -   CMP Latest Ref Rng & Units 04/03/2021 03/16/2020 01/16/2020  Glucose 70 - 99 mg/dL 93 98 94  BUN 6 - 23 mg/dL 14 13 21(H)  Creatinine 0.40 - 1.20 mg/dL 0.71 0.72 0.89  Sodium 135 - 145 mEq/L 144 139 140  Potassium 3.5 - 5.1 mEq/L 3.6 4.2 3.9  Chloride 96 - 112 mEq/L 107 101 104  CO2 19 - 32 mEq/L 30 30 28   Calcium 8.4 - 10.5 mg/dL 8.6 8.6(L) 8.7(L)  Total Protein 6.0 - 8.3 g/dL 6.2 7.1 6.9  Total Bilirubin 0.2 - 1.2 mg/dL 0.4 0.3 0.6  Alkaline Phos 39 - 117 U/L 79 65 94  AST 0 - 37 U/L 18 38 49(H)  ALT 0 - 35 U/L 16 40 71(H)   Iron/TIBC/Ferritin/ %Sat    Component Value Date/Time   IRON 98 07/08/2021 1234   TIBC 361 07/08/2021 1234   FERRITIN 83 07/08/2021 1234   IRONPCTSAT 27 07/08/2021 1234   B12 07/08/21- 244 04/03/21- 272 03/16/20- 339   Assessment and plan:   1.  Elevated alkaline  phosphatase: alk phos has normalized. Bone scan from 2021 was stable. Monitor.  2. Iron deficiency - secondary to gastric bypass. Last IV iron 04/25/21. Labs today reviewed and no indication for IV iron.  3. B12 deficiency- has discontinued b12 injections. B12 slightly low but normal. Recommend restarting home b12 injections. Refill provided. Patient will self administer.   Follow up: 6 months- lab (cbc, ferritin, iron studies, b12), Dr. Janese Banks virtually  I discussed the assessment and treatment plan with the patient. The patient was provided an opportunity to ask questions and all were answered. The patient agreed with the plan and demonstrated an understanding of the instructions.   The patient was advised to call back or seek an in-person evaluation if the symptoms worsen or if the condition fails to improve as anticipated.   I spent 20 minutes face-to-face video visit time dedicated to the care of this patient on the date of this encounter to include pre-visit review of labs, imaging, hematology notes, face-to-face time with the patient, and post visit ordering of testing/documentation.   Visit Diagnosis: 1. B12 deficiency   2. Iron deficiency anemia, unspecified iron deficiency anemia type  Beckey Rutter, DNP, AGNP-C [place] at [facility] [phone] (clinic) 07/11/2021

## 2021-07-16 ENCOUNTER — Telehealth: Payer: Self-pay | Admitting: Neurology

## 2021-07-16 MED ORDER — TOPIRAMATE 25 MG PO TABS: ORAL_TABLET | ORAL | 3 refills | Status: DC

## 2021-07-16 NOTE — Telephone Encounter (Signed)
Pt advised of Dr.Jaffe note,  Start topiramate 25mg  at bedtime for one week, then increase to 50mg  at bedtime.  Side effects may include numbness and tingling, so I want her to be aware in case she experiences that.   Medication sent to [facility].

## 2021-07-16 NOTE — Telephone Encounter (Signed)
Pt states that her insurance will not cover the injections.  Per last Mychart message if those are not covered Dr.Jaffe recommends Botox or Topiramate.  Per pt she prefers the pill before she tried having needles stuck in her face.

## 2021-07-18 ENCOUNTER — Encounter: Payer: Self-pay | Admitting: Oncology

## 2021-08-05 ENCOUNTER — Other Ambulatory Visit: Payer: Self-pay

## 2021-08-05 ENCOUNTER — Ambulatory Visit (INDEPENDENT_AMBULATORY_CARE_PROVIDER_SITE_OTHER): Payer: PRIVATE HEALTH INSURANCE | Admitting: Adult Health

## 2021-08-05 ENCOUNTER — Encounter: Payer: Self-pay | Admitting: Adult Health

## 2021-08-05 VITALS — BP 132/78 | HR 91 | Temp 95.5°F | Ht 64.49 in | Wt 203.4 lb

## 2021-08-05 DIAGNOSIS — Z9884 Bariatric surgery status: Secondary | ICD-10-CM

## 2021-08-05 DIAGNOSIS — R110 Nausea: Secondary | ICD-10-CM

## 2021-08-05 DIAGNOSIS — R1013 Epigastric pain: Secondary | ICD-10-CM | POA: Insufficient documentation

## 2021-08-05 DIAGNOSIS — R1011 Right upper quadrant pain: Secondary | ICD-10-CM | POA: Diagnosis not present

## 2021-08-05 LAB — COMPREHENSIVE METABOLIC PANEL
ALT: 35 U/L (ref 0–35)
AST: 31 U/L (ref 0–37)
Albumin: 3.9 g/dL (ref 3.5–5.2)
Alkaline Phosphatase: 107 U/L (ref 39–117)
BUN: 12 mg/dL (ref 6–23)
CO2: 27 mEq/L (ref 19–32)
Calcium: 9 mg/dL (ref 8.4–10.5)
Chloride: 110 mEq/L (ref 96–112)
Creatinine, Ser: 0.89 mg/dL (ref 0.40–1.20)
GFR: 73.09 mL/min (ref >60.00)
Glucose, Bld: 84 mg/dL (ref 70–99)
Potassium: 4.4 mEq/L (ref 3.5–5.1)
Sodium: 141 mEq/L (ref 135–145)
Total Bilirubin: 0.4 mg/dL (ref 0.2–1.2)
Total Protein: 6.8 g/dL (ref 6.0–8.3)

## 2021-08-05 LAB — CBC WITH DIFFERENTIAL/PLATELET
Basophils Absolute: 0 10*3/uL (ref 0.0–0.1)
Basophils Relative: 0.4 % (ref 0.0–3.0)
Eosinophils Absolute: 0.1 10*3/uL (ref 0.0–0.7)
Eosinophils Relative: 1.9 % (ref 0.0–5.0)
HCT: 38.5 % (ref 36.0–46.0)
Hemoglobin: 12.5 g/dL (ref 12.0–15.0)
Lymphocytes Relative: 27.6 % (ref 12.0–46.0)
Lymphs Abs: 1.5 10*3/uL (ref 0.7–4.0)
MCHC: 32.3 g/dL (ref 30.0–36.0)
MCV: 92.3 fl (ref 78.0–100.0)
Monocytes Absolute: 0.3 10*3/uL (ref 0.1–1.0)
Monocytes Relative: 4.9 % (ref 3.0–12.0)
Neutro Abs: 3.5 10*3/uL (ref 1.4–7.7)
Neutrophils Relative %: 65.2 % (ref 43.0–77.0)
Platelets: 165 10*3/uL (ref 150.0–400.0)
RBC: 4.17 Mil/uL (ref 3.87–5.11)
RDW: 15.4 % (ref 11.5–15.5)
WBC: 5.4 10*3/uL (ref 4.0–10.5)

## 2021-08-05 LAB — LIPASE: Lipase: 26 U/L (ref 11.0–59.0)

## 2021-08-05 LAB — AMYLASE: Amylase: 29 U/L (ref 27–131)

## 2021-08-05 MED ORDER — OMEPRAZOLE 20 MG PO CPDR: 20.0000 mg | DELAYED_RELEASE_CAPSULE | Freq: Every day | ORAL | 0 refills | Status: DC

## 2021-08-05 NOTE — Progress Notes (Signed)
CBC, CMP, lipase and amylase within normal limits.

## 2021-08-05 NOTE — Progress Notes (Signed)
Acute Office Visit  Subjective:    Patient ID: [name], female    DOB: 1965-12-27, 55 y.o.   MRN: [id number]  Chief Complaint  Patient presents with   GI Problem    GI Problem The primary symptoms include weight loss, abdominal pain and nausea. Primary symptoms do not include fever, fatigue, vomiting, diarrhea, melena, hematochezia, dysuria, myalgias or arthralgias.  The illness is also significant for anorexia. The illness does not include chills or constipation.  Abdominal Pain This is a new (last one month- 2 months) problem. The onset quality is gradual. The problem occurs intermittently (worse with eating. gastric bypass 2013.). The problem has been gradually worsening. The pain is located in the epigastric region. The pain is mild. The quality of the pain is aching. The abdominal pain does not radiate. Associated symptoms include anorexia, flatus, nausea and weight loss. Pertinent negatives include no arthralgias, belching, constipation, diarrhea, dysuria, fever, frequency, headaches, hematochezia, hematuria, melena, myalgias or vomiting. The pain is aggravated by eating. The pain is relieved by Nothing. She has tried nothing for the symptoms. The treatment provided no relief. Her past medical history is significant for abdominal surgery.  Eating very small amount in evenings, feels like food gets stuck in upper stomach. Denies any problems swallowing. Denies any blood in stools or mucous.  Bowel movements within normal per patient.  Pain worse after eating.   Past Surgical History:  Procedure Laterality Date   Abd [place] - gallstones  5/03   BACK SURGERY  2009   decompressive laminectomy   BARIATRIC SURGERY  03/02/2012   gastric bypass; Dr Darnell Level   CHOLECYSTECTOMY  6/03   COLONOSCOPY WITH PROPOFOL N/A 05/01/2017   Procedure: COLONOSCOPY WITH PROPOFOL;  Surgeon: Lucilla Lame, MD;  Location: [facility];  Service: Gastroenterology;  Laterality: N/A;   DILATION AND  CURETTAGE OF UTERUS  1/05   ESOPHAGOGASTRODUODENOSCOPY (EGD) WITH PROPOFOL N/A 05/01/2017   Procedure: ESOPHAGOGASTRODUODENOSCOPY (EGD) WITH PROPOFOL;  Surgeon: Lucilla Lame, MD;  Location: [facility];  Service: Gastroenterology;  Laterality: N/A;   KNEE SURGERY     L - arthroscopic   LIVER BIOPSY  6/03   fatty liver   LUMBAR SPINE SURGERY  2015   Dr. Mick Sell   NASAL SINUS SURGERY     PANNICULECTOMY  08/2013   [facility], Dr. Chrisandra Carota   SHOULDER ARTHROSCOPY Left 10/06/2019   sleep study - apnea  04/2001   improved since gastric bypass    She had been taking Excedrin for migraines and stopped it the end of October. Not taking a PPI.  Weight in office is stable, she feels she is losing some weight. Has seen Dr. Allen Norris in past for anemia.  2013 she had gastric bypass. She was going to have revision several years ago and had ulcer on endoscopy at that time. Dr. Darnell Level did gastric bypass.  Prilosec is on medication list but not taking.    Past Medical History:  Diagnosis Date   Anemia    Anxiety and depression    Chronic back pain    DM2 (diabetes mellitus, type 2) (HCC)    improved since gastric bypass   Headache    stress/sinus - 2-3x/wk   History of left shoulder fracture    HLD (hyperlipidemia)    HTN (hypertension)    improved since gastric bypass   Hypothyroid    Left leg weakness    s/p back surgery   Migraines    Obesity  Pernicious anemia    S/P insertion of spinal cord stimulator    Seizure [facility])    told she had a seizure as young child, not sure of context; mother states she didnt witness   Wears contact lenses     Past Surgical History:  Procedure Laterality Date   Abd [place] - gallstones  5/03   BACK SURGERY  2009   decompressive laminectomy   BARIATRIC SURGERY  03/02/2012   gastric bypass; Dr Darnell Level   CHOLECYSTECTOMY  6/03   COLONOSCOPY WITH PROPOFOL N/A 05/01/2017   Procedure: COLONOSCOPY WITH PROPOFOL;  Surgeon: Lucilla Lame, MD;  Location: [place];  Service: Gastroenterology;  Laterality: N/A;   DILATION AND CURETTAGE OF UTERUS  1/05   ESOPHAGOGASTRODUODENOSCOPY (EGD) WITH PROPOFOL N/A 05/01/2017   Procedure: ESOPHAGOGASTRODUODENOSCOPY (EGD) WITH PROPOFOL;  Surgeon: Lucilla Lame, MD;  Location: [place];  Service: Gastroenterology;  Laterality: N/A;   KNEE SURGERY     L - arthroscopic   LIVER BIOPSY  6/03   fatty liver   LUMBAR SPINE SURGERY  2015   Dr. Mick Sell   NASAL SINUS SURGERY     PANNICULECTOMY  08/2013   [facility], Dr. Chrisandra Carota   SHOULDER ARTHROSCOPY Left 10/06/2019   sleep study - apnea  04/2001   improved since gastric bypass    Family History  Problem Relation Age of Onset   Lung cancer Father    Diabetes Mother    Heart failure Mother    Diabetes Sister    Fibromyalgia Sister    Colon cancer Maternal Aunt    Skin cancer Paternal Aunt    Skin cancer Paternal Uncle     Social History   Socioeconomic History   Marital status: Married    Spouse name: Not on file   Number of children: Not on file   Years of education: Not on file   Highest education level: Not on file  Occupational History   Not on file  Tobacco Use   Smoking status: Never   Smokeless tobacco: Never   Tobacco comments:    tobacco use - no  Vaping Use   Vaping Use: Never used  Substance and Sexual Activity   Alcohol use: No   Drug use: No   Sexual activity: Not Currently    Birth control/protection: None  Other Topics Concern   Not on file  Social History Narrative   Partner- Shirlean Mylar, no children; works in [place], does not get regular exercise.    Right handed   One story home   No caffeine   Social Determinants of Health   Financial Resource Strain: Not on file  Food Insecurity: Not on file  Transportation Needs: Not on file  Physical Activity: Not on file  Stress: Not on file  Social Connections: Not on file  Intimate Partner Violence: Not on file    Outpatient Medications Prior to Visit  Medication Sig  Dispense Refill   cyanocobalamin (,VITAMIN B-12,) 1000 MCG/ML injection Inject 1 mL (1,000 mcg total) into the muscle every 30 (thirty) days. 1 mL 5   Estradiol-Norethindrone Acet 0.5-0.1 MG tablet Take 1 tablet by mouth daily. 28 tablet 11   fentaNYL (DURAGESIC) 25 MCG/HR 1 patch every 3 (three) days.     naloxone (NARCAN) nasal spray 4 mg/0.1 mL Place 1 spray into the nose as needed (in case of too much pain patch in her system).     promethazine (PHENERGAN) 12.5 MG tablet TAKE 1 TABLET (12.5 MG TOTAL)  BY MOUTH EVERY 8 (EIGHT) HOURS AS NEEDED FOR NAUSEA OR VOMITING. 20 tablet 0   rizatriptan (MAXALT-MLT) 10 MG disintegrating tablet Take 1 tablet earliest onset of migraine.  May repeat in 2 hours if needed.  Maximum 2 tablets in 24 hours. 10 tablet 5   SYRINGE-NEEDLE, DISP, 3 ML 25G X 1" 3 ML MISC Used to give monthly B-12 injections. 1 each 5   topiramate (TOPAMAX) 25 MG tablet Start topiramate 25mg  at bedtime for one week, then increase to 50mg  at bedtime. 60 tablet 3   traZODone (DESYREL) 100 MG tablet TAKE 1 TABLET BY MOUTH EVERYDAY AT BEDTIME 90 tablet 3   venlafaxine XR (EFFEXOR XR) 37.5 MG 24 hr capsule Take 1 capsule (37.5 mg total) by mouth daily with breakfast. 90 capsule 1   azelastine (ASTELIN) 0.1 % nasal spray Place 1 spray into both nostrils 2 (two) times daily. Use in each nostril as directed (Patient not taking: Reported on 04/29/2021) 30 mL 4   fentaNYL (DURAGESIC - DOSED MCG/HR) 50 MCG/HR APPLY 1 PATCH(ES) EVERY 72 HOURS BY TRANSDERMAL ROUTE. (Patient not taking: Reported on 04/29/2021)  0   FLUoxetine (PROZAC) 20 MG capsule TAKE 1 CAPSULE (20 MG TOTAL) BY MOUTH EVERY MORNING FOR 7 DAYS. THEN STOP. (Patient not taking: Reported on 04/29/2021) 30 capsule 0   folic acid (FOLVITE) 1 MG tablet Take 1 tablet (1 mg total) by mouth daily. (Patient not taking: Reported on 04/29/2021) 90 tablet 3   Galcanezumab-gnlm (EMGALITY) 120 MG/ML SOAJ Inject 120 mg into the skin every 28 (twenty-eight)  days. (Patient not taking: Reported on 07/08/2021) 1.12 mL 5   levothyroxine (SYNTHROID) 100 MCG tablet TAKE 1 TABLET BY MOUTH EVERY DAY (Patient not taking: Reported on 04/29/2021) 30 tablet 7   Magnesium Citrate 200 MG TABS Take 400 mg by mouth daily. (Patient not taking: Reported on 04/29/2021) 120 tablet 1   methocarbamol (ROBAXIN) 500 MG tablet Take 500 mg by mouth 4 (four) times daily. (Patient not taking: Reported on 06/19/2021)     mometasone (NASONEX) 50 MCG/ACT nasal spray Place 2 sprays into the nose daily. (Patient not taking: Reported on 04/29/2021)     NEEDLE, DISP, 25 G (B-D DISP NEEDLE 25GX1") 25G X 1" MISC Used to give monlthly B-12 injections. (Patient not taking: Reported on 06/19/2021) 1 each 5   omeprazole (PRILOSEC) 20 MG capsule Take 1 capsule (20 mg total) by mouth daily. (Patient not taking: Reported on 04/29/2021) 30 capsule 3   No facility-administered medications prior to visit.    Allergies  Allergen Reactions   Latex Rash   Penicillins Itching and Swelling    Has patient had a PCN reaction causing immediate rash, facial/tongue/throat swelling, SOB or lightheadedness with hypotension: yes Has patient had a PCN reaction causing severe rash involving mucus membranes or skin necrosis: no Has patient had a PCN reaction that required hospitalization : yes ed visit Has patient had a PCN reaction occurring within the last 10 years: yes If all of the above answers are "NO", then may proceed with Cephalosporin use.     Review of Systems  Constitutional:  Positive for weight loss. Negative for activity change, appetite change, chills, diaphoresis, fatigue, fever and unexpected weight change.  HENT: Negative.    Respiratory: Negative.    Cardiovascular: Negative.   Gastrointestinal:  Positive for abdominal pain, anorexia, flatus and nausea. Negative for abdominal distention, anal bleeding, blood in stool, constipation, diarrhea, hematochezia, melena, rectal pain and vomiting.   Genitourinary: Negative.  Negative for dysuria, frequency and hematuria.  Musculoskeletal:  Negative for arthralgias and myalgias.  Skin: Negative.   Neurological: Negative.  Negative for headaches.  Psychiatric/Behavioral: Negative.        Objective:    Physical Exam Vitals reviewed.  Constitutional:      General: She is not in acute distress.    Appearance: She is obese. She is not ill-appearing, toxic-appearing or diaphoretic.  HENT:     Head: Normocephalic and atraumatic.     Right Ear: External ear normal.     Left Ear: External ear normal.     Nose: Nose normal.     Mouth/Throat:     Pharynx: Oropharynx is clear. No oropharyngeal exudate or posterior oropharyngeal erythema.  Eyes:     Extraocular Movements: Extraocular movements intact.     Conjunctiva/sclera: Conjunctivae normal.  Cardiovascular:     Rate and Rhythm: Normal rate and regular rhythm.     Pulses: Normal pulses.     Heart sounds: Normal heart sounds. No murmur heard.   No friction rub. No gallop.  Pulmonary:     Effort: Pulmonary effort is normal. No respiratory distress.     Breath sounds: Normal breath sounds. No stridor. No wheezing, rhonchi or rales.  Chest:     Chest wall: No tenderness.  Abdominal:     General: Bowel sounds are normal. There is no distension.     Palpations: Abdomen is soft. There is no mass.     Tenderness: There is abdominal tenderness in the right upper quadrant and epigastric area. There is no right CVA tenderness, left CVA tenderness, guarding or rebound.     Hernia: No hernia is present.  Musculoskeletal:     Cervical back: Normal range of motion.  Lymphadenopathy:     Cervical: No cervical adenopathy.  Skin:    General: Skin is warm.     Findings: No erythema or rash.  Neurological:     Mental Status: She is alert and oriented to person, place, and time.     Cranial Nerves: No cranial nerve deficit.     Motor: No weakness.  Psychiatric:        Mood and Affect: Mood  normal.        Behavior: Behavior normal.        Thought Content: Thought content normal.        Judgment: Judgment normal.    BP 132/78   Pulse 91   Temp (!) 95.5 F (35.3 C)   Ht 5' 4.49" (1.638 m)   Wt 203 lb 6.4 oz (92.3 kg)   LMP 11/28/2019 (Approximate)   SpO2 95%   BMI 34.39 kg/m  Wt Readings from Last 3 Encounters:  08/05/21 203 lb 6.4 oz (92.3 kg)  07/08/21 205 lb 8 oz (93.2 kg)  06/19/21 195 lb (88.5 kg)   Filed Weights   08/05/21 1042  Weight: 203 lb 6.4 oz (92.3 kg)    Health Maintenance Due  Topic Date Due   Zoster Vaccines- Shingrix (1 of 2) Never done   Pneumococcal Vaccine 18-20 Years old (2 - PCV) 07/21/2006   MAMMOGRAM  05/14/2016   INFLUENZA VACCINE  04/15/2021    There are no preventive care reminders to display for this patient.   Lab Results  Component Value Date   TSH 4.53 04/03/2021   Lab Results  Component Value Date   WBC 6.7 07/08/2021   HGB 12.5 07/08/2021   HCT 38.5 07/08/2021   MCV 91.2 07/08/2021  PLT 216 07/08/2021   Lab Results  Component Value Date   NA 144 04/03/2021   K 3.6 04/03/2021   CO2 30 04/03/2021   GLUCOSE 93 04/03/2021   BUN 14 04/03/2021   CREATININE 0.71 04/03/2021   BILITOT 0.4 04/03/2021   ALKPHOS 79 04/03/2021   AST 18 04/03/2021   ALT 16 04/03/2021   PROT 6.2 04/03/2021   ALBUMIN 3.6 04/03/2021   CALCIUM 8.6 04/03/2021   ANIONGAP 8 03/16/2020   GFR 96.08 04/03/2021   Lab Results  Component Value Date   CHOL 103 04/03/2021   Lab Results  Component Value Date   HDL 44.80 04/03/2021   Lab Results  Component Value Date   LDLCALC 46 04/03/2021   Lab Results  Component Value Date   TRIG 61.0 04/03/2021   Lab Results  Component Value Date   CHOLHDL 2 04/03/2021   Lab Results  Component Value Date   HGBA1C 6.1 04/03/2021       Assessment & Plan:   Problem List Items Addressed This Visit       Other   Nausea   Relevant Medications   omeprazole (PRILOSEC) 20 MG capsule    Other Relevant Orders   US Abdomen Limited RUQ (LIVER/GB)   CT Abdomen Pelvis W Contrast   Epigastric pain - Primary   Relevant Medications   omeprazole (PRILOSEC) 20 MG capsule   Other Relevant Orders   CBC with Differential/Platelet   Comprehensive metabolic panel   US Abdomen Limited RUQ (LIVER/GB)   CT Abdomen Pelvis W Contrast   RUQ pain   Relevant Medications   omeprazole (PRILOSEC) 20 MG capsule   Other Relevant Orders   Lipase   Amylase   US Abdomen Limited RUQ (LIVER/GB)   CT Abdomen Pelvis W Contrast   Other Visit Diagnoses     History of gastric bypass       Relevant Orders   US Abdomen Limited RUQ (LIVER/GB)   Ambulatory referral to Gastroenterology   CT Abdomen Pelvis W Contrast       Start Prilosec for questionable peptic ulcer. Follow up with Dr. Allen Norris advised.  Given pain in epigastrium and RUQ will check [place] of liver and CT abdomen given gastric bypass history.  Meds ordered this encounter  Medications   omeprazole (PRILOSEC) 20 MG capsule    Sig: Take 1 capsule (20 mg total) by mouth daily.    Dispense:  30 capsule    Refill:  0  Should hear from GI within 1- 2 weeks call if not.   Orders Placed This Encounter  Procedures   US Abdomen Limited RUQ (LIVER/GB)   CT Abdomen Pelvis W Contrast   CBC with Differential/Platelet   Comprehensive metabolic panel   Lipase   Amylase   Ambulatory referral to Gastroenterology    Advised patient call the office or your primary care doctor for an appointment if no improvement within 72 hours or if any symptoms change or worsen at any time  Advised ER or urgent Care if after hours or on weekend. Call 911 for emergency symptoms at any time.Patinet verbalized understanding of all instructions given/reviewed and treatment plan and has no further questions or concerns at this time.     Return in about 3 weeks (around 08/26/2021), or if symptoms worsen or fail to improve, for at any time for any worsening symptoms, Go to  Emergency room/ urgent care if worse.    Marcille Buffy, FNP

## 2021-08-05 NOTE — Patient Instructions (Signed)
Abdominal Pain, Adult Pain in the abdomen (abdominal pain) can be caused by many things. Often, abdominal pain is not serious and it gets better with no treatment or by being treated at home. However, sometimes abdominal pain is serious. Your health care provider will ask questions about your medical history and do a physical exam to try to determine the cause of your abdominal pain. Follow these instructions at home: Medicines Take over-the-counter and prescription medicines only as told by your health care provider. Do not take a laxative unless told by your health care provider. General instructions  Watch your condition for any changes. Drink enough fluid to keep your urine pale yellow. Keep all follow-up visits as told by your health care provider. This is important. Contact a health care provider if: Your abdominal pain changes or gets worse. You are not hungry or you lose weight without trying. You are constipated or have diarrhea for more than 2-3 days. You have pain when you urinate or have a bowel movement. Your abdominal pain wakes you up at night. Your pain gets worse with meals, after eating, or with certain foods. You are vomiting and cannot keep anything down. You have a fever. You have blood in your urine. Get help right away if: Your pain does not go away as soon as your health care provider told you to expect. You cannot stop vomiting. Your pain is only in areas of the abdomen, such as the right side or the left lower portion of the abdomen. Pain on the right side could be caused by appendicitis. You have bloody or black stools, or stools that look like tar. You have severe pain, cramping, or bloating in your abdomen. You have signs of dehydration, such as: Dark urine, very little urine, or no urine. Cracked lips. Dry mouth. Sunken eyes. Sleepiness. Weakness. You have trouble breathing or chest pain. Summary Often, abdominal pain is not serious and it gets  better with no treatment or by being treated at home. However, sometimes abdominal pain is serious. Watch your condition for any changes. Take over-the-counter and prescription medicines only as told by your health care provider. Contact a health care provider if your abdominal pain changes or gets worse. Get help right away if you have severe pain, cramping, or bloating in your abdomen. This information is not intended to replace advice given to you by your health care provider. Make sure you discuss any questions you have with your health care provider. Document Revised: 10/21/2019 Document Reviewed: 01/10/2019 Elsevier Patient Education  2022 Elsevier Inc. Omeprazole Tablets What is this medication? OMEPRAZOLE (oh ME pray zol) is used to treat heartburn, stomach ulcers, reflux disease, or other conditions that cause too much stomach acid. It works by reducing the amount of acid in the stomach. It belongs to a group of medications called PPIs. This medicine may be used for other purposes; ask your health care provider or pharmacist if you have questions. COMMON BRAND NAME(S): Prilosec OTC What should I tell my care team before I take this medication? They need to know if you have any of these conditions: Chest pain Have had heartburn for over 3 months Have heartburn with dizziness, lightheadedness or sweating Liver disease Low levels of calcium, magnesium, or potassium in the blood Lupus Stomach pain Trouble swallowing Unexplained weight loss Vomiting with blood Wheezing An unusual or allergic reaction to omeprazole, other medications, foods, dyes, or preservatives Pregnant or trying to get pregnant Breast-feeding How should I use this medication?  Take this medication by mouth with a glass of water. Follow the directions on the product label. Do not cut, crush or chew this medication. Swallow the tablets whole. Take this medication on an empty stomach, at least 30 minutes before  breakfast. Take your medication at regular intervals. Do not take it more often than directed. Talk to your care team about the use of this medication in children. Special care may be needed. Overdosage: If you think you have taken too much of this medicine contact a poison control center or emergency room at once. NOTE: This medicine is only for you. Do not share this medicine with others. What if I miss a dose? If you miss a dose, take it as soon as you can. If it is almost time for your next dose, take only that dose. Do not take double or extra doses. What may interact with this medication? Do not take this medication with any of the following: Atazanavir Clopidogrel Nelfinavir Rilpivirine This medication may also interact with the following: Antifungals like itraconazole, ketoconazole, and voriconazole Certain antivirals for HIV or hepatitis Certain medications that treat or prevent blood clots like warfarin Cilostazol Citalopram Cyclosporine Dasatinib Digoxin Disulfiram Diuretics Erlotinib Iron supplements Medications for anxiety, panic, and sleep like diazepam Medications for seizures like carbamazepine, phenobarbital, phenytoin Methotrexate Mycophenolate mofetil Nilotinib Rifampin St. John's wort Tacrolimus Vitamin B12 This list may not describe all possible interactions. Give your health care provider a list of all the medicines, herbs, non-prescription drugs, or dietary supplements you use. Also tell them if you smoke, drink alcohol, or use illegal drugs. Some items may interact with your medicine. What should I watch for while using this medication? It can take several days before your stomach pain gets better. Check with your care team if your condition does not start to get better, or if it gets worse. Do not treat diarrhea with over the counter products. Contact your care team if you have diarrhea that lasts more than 2 days or if it is severe and watery. You may  need blood work done while you are taking this medication. Using this medication for a long time may weaken your bones. The risk of bone fractures may be increased. Talk to your care team about your bone health. Using this medication for a long time may cause growths (polyps) in the stomach. They usually don't cause any symptoms. They are usually not cancerous. Contact your care team if you notice pain or tenderness when you press your stomach, have nausea, or see bloody or black, tar-like stools. This medication may cause a decrease in vitamin B12. You should make sure that you get enough vitamin B12 while you are taking this medication. Discuss the foods you eat and the vitamins you take with your care team. What side effects may I notice from receiving this medication? Side effects that you should report to your care team as soon as possible: Allergic reactions--skin rash, itching, hives, swelling of the face, lips, tongue, or throat Kidney injury--decrease in the amount of urine, swelling of the ankles, hands, or feet Low magnesium level--muscle pain or cramps, unusual weakness, fatigue, fast or irregular heartbeat, tremors Low vitamin B12 level--pain, tingling, or numbness in the hands or feet, muscle weakness, dizziness, confusion, difficulty concentrating Rash on the cheeks or arms that gets worse in the sun Redness, blistering, peeling, or loosening of the skin, including inside the mouth Severe diarrhea, fever Unusual bleeding or bruising Side effects that usually do not require  medical attention (report to your care team if they continue or are bothersome): Gas Headache Nausea Stomach pain Vomiting This list may not describe all possible side effects. Call your doctor for medical advice about side effects. You may report side effects to FDA at 1-800-FDA-1088. Where should I keep my medication? Keep out of the reach of children and pets. Store at room temperature between 20 and 25  degrees C (68 and 77 degrees F). Protect from light and moisture. Get rid of any unused medication after the expiration date. To get rid of medications that are no longer needed or expired: Take the medication to a medication take-back program. Check with your pharmacy or law enforcement to find a location. If you cannot return the medication, check the label or package insert to see if the medication should be thrown out in the garbage or flushed down the toilet. If you are not sure, ask your care team. If it is safe to put in the trash, empty the medication out of the container. Mix the medication with cat litter, dirt, coffee grounds, or other unwanted substance. Seal the mixture in a bag or container. Put it in the trash. NOTE: This sheet is a summary. It may not cover all possible information. If you have questions about this medicine, talk to your doctor, pharmacist, or health care provider.  2022 Elsevier/Gold Standard (2020-11-27 00:00:00)

## 2021-08-07 ENCOUNTER — Telehealth: Payer: Self-pay

## 2021-08-07 NOTE — Telephone Encounter (Signed)
LMTCB in regards to lab results.  

## 2021-08-12 ENCOUNTER — Telehealth: Payer: Self-pay | Admitting: Family

## 2021-08-12 NOTE — Telephone Encounter (Signed)
Lft pt vm to call ofc regarding needing a copy of the ins card. Thanks

## 2021-08-13 ENCOUNTER — Telehealth: Payer: Self-pay | Admitting: Family

## 2021-08-13 NOTE — Telephone Encounter (Signed)
Lft pt vm to call ofc about [employer] ins card to be uploaded to start auth. thanks

## 2021-08-16 ENCOUNTER — Telehealth: Payer: Self-pay | Admitting: Family

## 2021-08-16 NOTE — Telephone Encounter (Signed)
Lft pt vm to call ofc regarding pt ins card needing to be uploaded or scanned in at the ofc to be able to start auth for pt CT. thanks

## 2021-08-23 ENCOUNTER — Encounter: Payer: Self-pay | Admitting: Oncology

## 2021-08-30 ENCOUNTER — Telehealth: Payer: Self-pay

## 2021-08-30 NOTE — Telephone Encounter (Signed)
LMTCB for lab results.  

## 2021-09-02 ENCOUNTER — Ambulatory Visit
Admission: RE | Admit: 2021-09-02 | Discharge: 2021-09-02 | Disposition: A | Discharge status: Home / Self Care | Payer: PRIVATE HEALTH INSURANCE | Source: Ambulatory Visit | Attending: Adult Health | Admitting: Adult Health

## 2021-09-02 ENCOUNTER — Other Ambulatory Visit: Payer: Self-pay | Admitting: Adult Health

## 2021-09-02 ENCOUNTER — Encounter: Payer: Self-pay | Admitting: Oncology

## 2021-09-02 DIAGNOSIS — R1013 Epigastric pain: Secondary | ICD-10-CM

## 2021-09-02 DIAGNOSIS — R1011 Right upper quadrant pain: Secondary | ICD-10-CM

## 2021-09-02 DIAGNOSIS — Z9884 Bariatric surgery status: Secondary | ICD-10-CM | POA: Insufficient documentation

## 2021-09-02 DIAGNOSIS — R110 Nausea: Secondary | ICD-10-CM

## 2021-09-02 NOTE — Progress Notes (Signed)
Normal RUQ ultrasound..  History of gallbladder removal.  Liver appeared within normal.  Please follow up with Dr. Servando Snare gastroenterologist as referred. Given history of bariactric surgery. She should have heard from GI by now, if not can give her the office number for [facility] to schedule. Follow up if any symptoms persisting or worsening.

## 2021-09-03 ENCOUNTER — Telehealth: Payer: Self-pay

## 2021-09-03 NOTE — Telephone Encounter (Signed)
[name],   Patient just informed us she has not heard to schedule CT scan ordered on 08/05/21  Thank you,  Marvell Fuller MSN, AGNP-C, FNP-C  Family Nurse Practitioner  Adult Geriatric Nurse Practitioner

## 2021-09-03 NOTE — Telephone Encounter (Signed)
Patient stated she has not heard anything in regards to her CT.

## 2021-09-04 NOTE — Telephone Encounter (Signed)
Patient has been informed.

## 2021-09-05 NOTE — Telephone Encounter (Signed)
Noted, [name] please let patient know we are still waiting on her insurance approval if any symptoms have worsened advise follow up this coming week. If any emergent or severe symptoms advise ER now.

## 2021-09-09 ENCOUNTER — Encounter: Payer: Self-pay | Admitting: Oncology

## 2021-09-22 NOTE — Progress Notes (Signed)
Primary Care Physician: Burnard Hawthorne, FNP  Primary Gastroenterologist:  Dr. Lucilla Lame  Chief Complaint  Patient presents with   Abdominal Pain   Diarrhea    HPI: [name] is a 56 y.o. female here with a history of seeing me for iron deficiency anemia and abnormal liver enzymes.  The patient was found to have fatty liver and both her CBC and her CMP were both normal recently.  The anemia and the abnormal liver enzymes have resolved.  The patient had reported that she was having abdominal pain and was set up for a CT scan of the abdomen by her primary care provider that was supposed to be done the morning of this visit today. The patient is status post gastric bypass surgery. Her amylase and lipase were both normal in November when she was having abdominal pain.  The patient's her primary care provider back in November and at that time reported her symptoms included weight loss abdominal pain and nausea. She had reported that the symptoms had been present one to 2 months prior to that visit and has gradually worsened.  She had reported to be in the epigastric region she also stated that the pain was worse with eating and was not relieved by anything in particular.  She was reporting that food felt like it was getting stuck in her stomach and she was not having any change in bowel habits. The patient reports that she also has diarrhea and her partner who comes with her today states that she has soiled the bed and has to sleep in a diaper.  The patient's last colonoscopy was in 2018 and was recommended to have a repeat colonoscopy in 5 years due to a inadequate prep.  Past Medical History:  Diagnosis Date   Anemia    Anxiety and depression    Chronic back pain    DM2 (diabetes mellitus, type 2) (HCC)    improved since gastric bypass   Headache    stress/sinus - 2-3x/wk   History of left shoulder fracture    HLD (hyperlipidemia)    HTN (hypertension)    improved since  gastric bypass   Hypothyroid    Left leg weakness    s/p back surgery   Migraines    Obesity    Pernicious anemia    S/P insertion of spinal cord stimulator    Seizure ([place])    told she had a seizure as young child, not sure of context; mother states she didnt witness   Wears contact lenses     Current Outpatient Medications  Medication Sig Dispense Refill   cyanocobalamin (,VITAMIN B-12,) 1000 MCG/ML injection Inject 1 mL (1,000 mcg total) into the muscle every 30 (thirty) days. 1 mL 5   Estradiol-Norethindrone Acet 0.5-0.1 MG tablet Take 1 tablet by mouth daily. 28 tablet 11   fentaNYL (DURAGESIC) 25 MCG/HR 1 patch every 3 (three) days.     NEEDLE, DISP, 25 G (B-D DISP NEEDLE 25GX1") 25G X 1" MISC Used to give monlthly B-12 injections. 1 each 5   promethazine (PHENERGAN) 12.5 MG tablet TAKE 1 TABLET (12.5 MG TOTAL) BY MOUTH EVERY 8 (EIGHT) HOURS AS NEEDED FOR NAUSEA OR VOMITING. 20 tablet 0   rizatriptan (MAXALT-MLT) 10 MG disintegrating tablet Take 1 tablet earliest onset of migraine.  May repeat in 2 hours if needed.  Maximum 2 tablets in 24 hours. 10 tablet 5   SYRINGE-NEEDLE, DISP, 3 ML 25G X 1" 3  ML MISC Used to give monthly B-12 injections. 1 each 5   topiramate (TOPAMAX) 25 MG tablet Start topiramate 33m at bedtime for one week, then increase to 527mat bedtime. 60 tablet 3   traZODone (DESYREL) 100 MG tablet TAKE 1 TABLET BY MOUTH EVERYDAY AT BEDTIME 90 tablet 3   Na Sulfate-K Sulfate-Mg Sulf 17.5-3.13-1.6 GM/177ML SOLN Take 1 kit by mouth once for 1 dose. 354 mL 0   No current facility-administered medications for this visit.    Allergies as of 09/23/2021 - Review Complete 09/23/2021  Allergen Reaction Noted   Latex Rash 03/31/2018   Penicillins Itching and Swelling 12/28/2007    ROS:  General: Negative for anorexia, weight loss, fever, chills, fatigue, weakness. ENT: Negative for hoarseness, difficulty swallowing , nasal congestion. CV: Negative for chest pain,  angina, palpitations, dyspnea on exertion, peripheral edema.  Respiratory: Negative for dyspnea at rest, dyspnea on exertion, cough, sputum, wheezing.  GI: See history of present illness. GU:  Negative for dysuria, hematuria, urinary incontinence, urinary frequency, nocturnal urination.  Endo: Negative for unusual weight change.    Physical Examination:   BP (!) 149/75    Pulse (!) 47    Temp 98.1 F (36.7 C) (Oral)    Ht 5' 5.5" (1.664 m)    Wt 200 lb (90.7 kg)    LMP 11/28/2019 (Approximate)    BMI 32.78 kg/m   General: Well-nourished, well-developed in no acute distress.  Eyes: No icterus. Conjunctivae pink. Lungs: Clear to auscultation bilaterally. Non-labored. Heart: Regular rate and rhythm, no murmurs rubs or gallops.  Abdomen: Bowel sounds are normal, nontender, nondistended, no hepatosplenomegaly or masses, no abdominal bruits or hernia , no rebound or guarding.   Extremities: No lower extremity edema. No clubbing or deformities. Neuro: Alert and oriented x 3.  Grossly intact. Skin: Warm and dry, no jaundice.   Psych: Alert and cooperative, normal mood and affect.  Labs:    Imaging Studies: CT Abdomen Pelvis W Contrast  Result Date: 09/23/2021 CLINICAL DATA:  Acute epigastric abdominal pain. EXAM: CT ABDOMEN AND PELVIS WITH CONTRAST TECHNIQUE: Multidetector CT imaging of the abdomen and pelvis was performed using the standard protocol following bolus administration of intravenous contrast. CONTRAST:  1008mMNIPAQUE IOHEXOL 300 MG/ML  SOLN COMPARISON:  November 01, 2018. FINDINGS: Lower chest: No acute abnormality. Hepatobiliary: No focal liver abnormality is seen. Status post cholecystectomy. No biliary dilatation. Pancreas: Unremarkable. No pancreatic ductal dilatation or surrounding inflammatory changes. Spleen: Normal in size without focal abnormality. Adrenals/Urinary Tract: Adrenal glands are unremarkable. Kidneys are normal, without renal calculi, focal lesion, or  hydronephrosis. Bladder is unremarkable. Stomach/Bowel: Status post gastric bypass. The appendix appears normal. There is no evidence of bowel obstruction or inflammation. Vascular/Lymphatic: No significant vascular findings are present. No enlarged abdominal or pelvic lymph nodes. Reproductive: Uterus and bilateral adnexa are unremarkable. Other: No abdominal wall hernia or abnormality. No abdominopelvic ascites. Musculoskeletal: No acute or significant osseous findings. IMPRESSION: No acute abnormality seen in the abdomen or pelvis. Electronically Signed   By: JamMarijo ConceptionD.   On: 09/23/2021 10:24   [place] [employer] RUQ (LIVER/GB)  Result Date: 09/02/2021 CLINICAL DATA:  Right upper quadrant and epigastric pain, nausea, history of bariatric surgery EXAM: ULTRASOUND ABDOMEN LIMITED RIGHT UPPER QUADRANT COMPARISON:  04/06/2019 FINDINGS: Gallbladder: Surgically absent Common bile duct: Diameter: 4 mm Liver: No focal lesion identified. Within normal limits in parenchymal echogenicity. Portal vein is patent on color Doppler imaging with normal direction of blood flow  towards the liver. Other: None. IMPRESSION: 1. Unremarkable right upper quadrant ultrasound in a patient status post cholecystectomy. Electronically Signed   By: Randa Ngo M.D.   On: 09/02/2021 09:28    Assessment and Plan:   Kyanne Rials is a 56 y.o. y/o female who comes in today with a history of gastric bypass surgery with abdominal pain and feeling that food is sticking after she eats.  The patient has lost 20 pounds without trying.  The patient also has diarrhea.  Patient will be set up for an EGD and colonoscopy to rule out any anastomotic ulcers or strictures as the cause of her upper GI symptoms and a colonoscopy because of her inadequate prep at her last colonoscopy and because of her diarrhea.  The patient has been explained the plan and agrees with it.     Lucilla Lame, MD. Marval Regal    Note: This dictation was  prepared with Dragon dictation along with smaller phrase technology. Any transcriptional errors that result from this process are unintentional.

## 2021-09-23 ENCOUNTER — Ambulatory Visit (INDEPENDENT_AMBULATORY_CARE_PROVIDER_SITE_OTHER): Payer: PRIVATE HEALTH INSURANCE | Admitting: Gastroenterology

## 2021-09-23 ENCOUNTER — Encounter: Payer: Self-pay | Admitting: Gastroenterology

## 2021-09-23 ENCOUNTER — Ambulatory Visit
Admission: RE | Admit: 2021-09-23 | Discharge: 2021-09-23 | Disposition: A | Discharge status: Home / Self Care | Payer: PRIVATE HEALTH INSURANCE | Source: Ambulatory Visit | Attending: Adult Health | Admitting: Adult Health

## 2021-09-23 ENCOUNTER — Other Ambulatory Visit: Payer: Self-pay

## 2021-09-23 VITALS — BP 149/75 | HR 47 | Temp 98.1°F | Ht 65.5 in | Wt 200.0 lb

## 2021-09-23 DIAGNOSIS — R1011 Right upper quadrant pain: Secondary | ICD-10-CM | POA: Diagnosis present

## 2021-09-23 DIAGNOSIS — R197 Diarrhea, unspecified: Secondary | ICD-10-CM

## 2021-09-23 DIAGNOSIS — R634 Abnormal weight loss: Secondary | ICD-10-CM | POA: Diagnosis not present

## 2021-09-23 DIAGNOSIS — G8929 Other chronic pain: Secondary | ICD-10-CM | POA: Diagnosis not present

## 2021-09-23 DIAGNOSIS — R1013 Epigastric pain: Secondary | ICD-10-CM | POA: Diagnosis present

## 2021-09-23 DIAGNOSIS — R110 Nausea: Secondary | ICD-10-CM | POA: Diagnosis present

## 2021-09-23 DIAGNOSIS — Z9884 Bariatric surgery status: Secondary | ICD-10-CM

## 2021-09-23 LAB — POCT I-STAT CREATININE: Creatinine, Ser: 1.1 mg/dL — ABNORMAL HIGH (ref 0.44–1.00)

## 2021-09-23 MED ORDER — NA SULFATE-K SULFATE-MG SULF 17.5-3.13-1.6 GM/177ML PO SOLN: 1.0000 | Freq: Once | ORAL | 0 refills | Status: AC

## 2021-09-23 MED ORDER — IOHEXOL 300 MG/ML  SOLN
100.0000 mL | Freq: Once | INTRAMUSCULAR | Status: AC | PRN
  Administered 2021-09-23: 100 mL via INTRAVENOUS

## 2021-09-23 NOTE — Progress Notes (Signed)
CT scan within normal limits. No abnormality seen. .please have her keep appointment with gastroenterology and also need to recheck CMP in 2 weeks. Creatine was elevated prior to CT. Please add lab and schedule.

## 2021-09-25 ENCOUNTER — Encounter: Payer: Self-pay | Admitting: Oncology

## 2021-09-26 ENCOUNTER — Telehealth: Payer: Self-pay

## 2021-09-26 DIAGNOSIS — R7989 Other specified abnormal findings of blood chemistry: Secondary | ICD-10-CM

## 2021-09-26 NOTE — Telephone Encounter (Signed)
Placed orders for CMP for future labs 

## 2021-09-26 NOTE — Telephone Encounter (Signed)
LMTCB for lab results.  

## 2021-10-01 ENCOUNTER — Other Ambulatory Visit: Payer: Self-pay | Admitting: Family

## 2021-10-01 DIAGNOSIS — R519 Headache, unspecified: Secondary | ICD-10-CM

## 2021-10-04 NOTE — Progress Notes (Signed)
Agree she should schedule follow up for hypertension.

## 2021-10-14 ENCOUNTER — Telehealth: Payer: Self-pay

## 2021-10-14 ENCOUNTER — Emergency Department
Admission: EM | Admit: 2021-10-14 | Discharge: 2021-10-14 | Disposition: A | Discharge status: Home / Self Care | Payer: PRIVATE HEALTH INSURANCE | Attending: Emergency Medicine | Admitting: Emergency Medicine

## 2021-10-14 ENCOUNTER — Emergency Department: Payer: PRIVATE HEALTH INSURANCE

## 2021-10-14 ENCOUNTER — Other Ambulatory Visit: Payer: Self-pay

## 2021-10-14 DIAGNOSIS — E039 Hypothyroidism, unspecified: Secondary | ICD-10-CM | POA: Diagnosis not present

## 2021-10-14 DIAGNOSIS — R1032 Left lower quadrant pain: Secondary | ICD-10-CM | POA: Diagnosis present

## 2021-10-14 DIAGNOSIS — N201 Calculus of ureter: Secondary | ICD-10-CM | POA: Insufficient documentation

## 2021-10-14 DIAGNOSIS — R110 Nausea: Secondary | ICD-10-CM | POA: Diagnosis not present

## 2021-10-14 DIAGNOSIS — R109 Unspecified abdominal pain: Secondary | ICD-10-CM

## 2021-10-14 DIAGNOSIS — E119 Type 2 diabetes mellitus without complications: Secondary | ICD-10-CM | POA: Diagnosis not present

## 2021-10-14 LAB — URINALYSIS, ROUTINE W REFLEX MICROSCOPIC
Bacteria, UA: NONE SEEN
Glucose, UA: NEGATIVE mg/dL
Nitrite: NEGATIVE
Protein, ur: 100 mg/dL — AB
RBC / HPF: >50 RBC/hpf — ABNORMAL HIGH (ref 0–5)
Specific Gravity, Urine: >1.03 — ABNORMAL HIGH (ref 1.005–1.030)
WBC, UA: >50 WBC/hpf — ABNORMAL HIGH (ref 0–5)
pH: 5 (ref 5.0–8.0)

## 2021-10-14 LAB — BASIC METABOLIC PANEL
Anion gap: 8 (ref 5–15)
BUN: 14 mg/dL (ref 6–20)
CO2: 28 mmol/L (ref 22–32)
Calcium: 8.8 mg/dL — ABNORMAL LOW (ref 8.9–10.3)
Chloride: 107 mmol/L (ref 98–111)
Creatinine, Ser: 0.96 mg/dL (ref 0.44–1.00)
GFR, Estimated: >60 mL/min (ref >60)
Glucose, Bld: 120 mg/dL — ABNORMAL HIGH (ref 70–99)
Potassium: 3.9 mmol/L (ref 3.5–5.1)
Sodium: 143 mmol/L (ref 135–145)

## 2021-10-14 LAB — CBC
HCT: 40.5 % (ref 36.0–46.0)
Hemoglobin: 12.9 g/dL (ref 12.0–15.0)
MCH: 31.2 pg (ref 26.0–34.0)
MCHC: 31.9 g/dL (ref 30.0–36.0)
MCV: 98.1 fL (ref 80.0–100.0)
Platelets: 180 10*3/uL (ref 150–400)
RBC: 4.13 MIL/uL (ref 3.87–5.11)
RDW: 13 % (ref 11.5–15.5)
WBC: 6.7 10*3/uL (ref 4.0–10.5)
nRBC: 0 % (ref 0.0–0.2)

## 2021-10-14 LAB — HEPATIC FUNCTION PANEL
ALT: 36 U/L (ref 0–44)
AST: 41 U/L (ref 15–41)
Albumin: 3.9 g/dL (ref 3.5–5.0)
Alkaline Phosphatase: 106 U/L (ref 38–126)
Bilirubin, Direct: <0.1 mg/dL (ref 0.0–0.2)
Total Bilirubin: 0.3 mg/dL (ref 0.3–1.2)
Total Protein: 7.1 g/dL (ref 6.5–8.1)

## 2021-10-14 LAB — LIPASE, BLOOD: Lipase: 32 U/L (ref 11–51)

## 2021-10-14 MED ORDER — ONDANSETRON HCL 4 MG/2ML IJ SOLN
4.0000 mg | Freq: Once | INTRAMUSCULAR | Status: AC
  Administered 2021-10-14: 4 mg via INTRAVENOUS
  Filled 2021-10-14: qty 2

## 2021-10-14 MED ORDER — MORPHINE SULFATE (PF) 4 MG/ML IV SOLN
4.0000 mg | Freq: Once | INTRAVENOUS | Status: AC
  Administered 2021-10-14: 4 mg via INTRAVENOUS
  Filled 2021-10-14: qty 1

## 2021-10-14 MED ORDER — OXYCODONE-ACETAMINOPHEN 5-325 MG PO TABS: 1.0000 | ORAL_TABLET | ORAL | 0 refills | Status: DC | PRN

## 2021-10-14 MED ORDER — LACTATED RINGERS IV BOLUS
1000.0000 mL | Freq: Once | INTRAVENOUS | Status: AC
  Administered 2021-10-14: 1000 mL via INTRAVENOUS

## 2021-10-14 MED ORDER — ONDANSETRON 4 MG PO TBDP: 4.0000 mg | ORAL_TABLET | Freq: Three times a day (TID) | ORAL | 0 refills | Status: DC | PRN

## 2021-10-14 NOTE — ED Triage Notes (Signed)
Pt c/o sudden onset left flank pain with nausea that started about PTA, pt denies having a hx of kidney stones

## 2021-10-14 NOTE — ED Provider Notes (Signed)
[facility] Provider Note    Event Date/Time   First MD Initiated Contact with Patient 10/14/21 1603     (approximate)   History   Chief Complaint Flank Pain   HPI  [name] is a 56 y.o. female with past medical history of hyperlipidemia, diabetes, anemia, hypothyroidism, and chronic pain syndrome who presents to the ED complaining of flank pain.  Patient reports that a couple hours prior to arrival she had sudden onset of pain in her left flank that radiates around to the left lower quadrant of her abdomen.  Pain has been constant since then, waxes and wanes in severity and seems to worsen with certain positions.  She has been feeling nauseous but has not vomited and denies any changes in her bowel movements.  She denies any fevers, dysuria, or hematuria.  She has never had similar symptoms in the past and denies any history of kidney stones.     Physical Exam   Triage Vital Signs: ED Triage Vitals  Enc Vitals Group     BP 10/14/21 1311 (!) 189/71     Pulse Rate 10/14/21 1311 65     Resp 10/14/21 1311 18     Temp 10/14/21 1311 98.9 F (37.2 C)     Temp Source 10/14/21 1311 Oral     SpO2 10/14/21 1311 99 %     Weight 10/14/21 1607 199 lb 15.3 oz (90.7 kg)     Height 10/14/21 1607 5' 5.5" (1.664 m)     Head Circumference --      Peak Flow --      Pain Score --      Pain Loc --      Pain Edu? --      Excl. in [place]? --     Most recent vital signs: Vitals:   10/14/21 1311 10/14/21 1833  BP: (!) 189/71 (!) 158/78  Pulse: 65 70  Resp: 18 18  Temp: 98.9 F (37.2 C)   SpO2: 99% 98%    Constitutional: Alert and oriented. Eyes: Conjunctivae are normal. Head: Atraumatic. Nose: No congestion/rhinnorhea. Mouth/Throat: Mucous membranes are moist.  Cardiovascular: Normal rate, regular rhythm. Grossly normal heart sounds.  2+ radial pulses bilaterally. Respiratory: Normal respiratory effort.  No retractions. Lungs CTAB. Gastrointestinal:  Soft and tender to palpation in the left lower quadrant with no rebound or guarding.  CVA tenderness noted on left. No distention. Musculoskeletal: No lower extremity tenderness nor edema.  Neurologic:  Normal speech and language. No gross focal neurologic deficits are appreciated.    ED Results / Procedures / Treatments   Labs (all labs ordered are listed, but only abnormal results are displayed) Labs Reviewed  URINALYSIS, ROUTINE W REFLEX MICROSCOPIC - Abnormal; Notable for the following components:      Result Value   APPearance CLOUDY (*)    Specific Gravity, Urine >1.030 (*)    Hgb urine dipstick LARGE (*)    Bilirubin Urine SMALL (*)    Ketones, ur TRACE (*)    Protein, ur 100 (*)    Leukocytes,Ua SMALL (*)    RBC / HPF >50 (*)    WBC, UA >50 (*)    All other components within normal limits  BASIC METABOLIC PANEL - Abnormal; Notable for the following components:   Glucose, Bld 120 (*)    Calcium 8.8 (*)    All other components within normal limits  URINE CULTURE  CBC  LIPASE, BLOOD  HEPATIC FUNCTION PANEL  RADIOLOGY CT renal protocol reviewed by me with possible left distal ureteral stone.  PROCEDURES:  Critical Care performed: No  Procedures   MEDICATIONS ORDERED IN ED: Medications  morphine 4 MG/ML injection 4 mg (4 mg Intravenous Given 10/14/21 1717)  ondansetron (ZOFRAN) injection 4 mg (4 mg Intravenous Given 10/14/21 1718)  lactated ringers bolus 1,000 mL (0 mLs Intravenous Stopped 10/14/21 1833)     IMPRESSION / MDM / ASSESSMENT AND PLAN / ED COURSE  I reviewed the triage vital signs and the nursing notes.                              56 y.o. female with past medical history of hyperlipidemia, diabetes, anemia, hypothyroidism, and chronic pain syndrome who presents to the ED with acute onset left flank pain radiating towards the left lower quadrant of her abdomen earlier today associated with nausea.  Differential diagnosis includes, but is not  limited to, ureterolithiasis, pyelonephritis, lumbar strain, lumbar radiculopathy, diverticulitis.  Patient nontoxic-appearing and in no acute distress, vital signs are reassuring.  Pain is reproducible with palpation of both with her costovertebral area in the left lower quadrant of her abdomen.  Symptoms seem most consistent with a kidney stone and we will further assess with CT renal protocol, treat symptomatically with IV morphine and Zofran.  Labs thus far are reassuring, CBC shows no leukocytosis and BMP with no electrolyte abnormality.  UA appears to be a contaminated sample but low suspicion for UTI given negative nitrites, small leuks, and no bacteria noted.  CT scan shows likely 2 mm stone at the left distal ureter with associated mild hydronephrosis, consistent with patient's symptoms.  There is also questionable inflammation in the area of her pancreas, however lipase is within normal limits and she has no epigastric tenderness, doubt acute pancreatitis.  On reassessment, patient is feeling much better following morphine and Zofran, she is appropriate for outpatient management.  She was counseled to follow-up with urology as needed, will be prescribed nausea and short course of pain medicine.  She was counseled to discuss pain medication with her pain management doctor before filling the prescription.  She was counseled to return to the ED for new worsening symptoms, patient agrees with plan.       FINAL CLINICAL IMPRESSION(S) / ED DIAGNOSES   Final diagnoses:  Ureterolithiasis  Left flank pain     Rx / DC Orders   ED Discharge Orders          Ordered    ondansetron (ZOFRAN-ODT) 4 MG disintegrating tablet  Every 8 hours PRN        10/14/21 1836    oxyCODONE-acetaminophen (PERCOCET) 5-325 MG tablet  Every 4 hours PRN        10/14/21 1836             Note:  This document was prepared using Dragon voice recognition software and may include unintentional dictation errors.    Blake Divine, MD 10/14/21 Bosie Helper

## 2021-10-14 NOTE — Telephone Encounter (Signed)
Pt is currently in ED for possible treatment of kidney stones, will call tomorrow to reschedule

## 2021-10-15 ENCOUNTER — Ambulatory Visit: Admit: 2021-10-15 | Payer: No Typology Code available for payment source | Admitting: Gastroenterology

## 2021-10-15 SURGERY — COLONOSCOPY WITH PROPOFOL
Anesthesia: General

## 2021-10-16 LAB — URINE CULTURE

## 2021-10-30 ENCOUNTER — Other Ambulatory Visit: Payer: Self-pay | Admitting: Family

## 2021-10-30 DIAGNOSIS — F418 Other specified anxiety disorders: Secondary | ICD-10-CM

## 2021-10-30 DIAGNOSIS — G4700 Insomnia, unspecified: Secondary | ICD-10-CM

## 2021-11-06 ENCOUNTER — Telehealth: Payer: Self-pay | Admitting: *Deleted

## 2021-11-06 ENCOUNTER — Other Ambulatory Visit: Payer: Self-pay | Admitting: Neurology

## 2021-11-06 NOTE — Telephone Encounter (Signed)
CT scan within normal limits. No abnormality seen. .please have her keep appointment with gastroenterology and also need to recheck CMP in 2 weeks. Creatine was elevated prior to CT. Please add lab and schedule. Agree she should schedule follow up for hypertension.  Please schedule follow up for hypertension with your primary care provider. Please call the office and schedule Follow up for your Blood pressure.

## 2021-11-06 NOTE — Telephone Encounter (Signed)
Patient has appt with [facility] April 19,2023

## 2021-11-15 ENCOUNTER — Encounter: Payer: Self-pay | Admitting: Oncology

## 2021-11-22 ENCOUNTER — Other Ambulatory Visit: Payer: Self-pay | Admitting: Neurology

## 2021-11-26 ENCOUNTER — Other Ambulatory Visit: Payer: Self-pay | Admitting: Family

## 2021-11-26 DIAGNOSIS — R519 Headache, unspecified: Secondary | ICD-10-CM

## 2022-01-01 ENCOUNTER — Encounter: Payer: Self-pay | Admitting: Oncology

## 2022-01-01 ENCOUNTER — Ambulatory Visit: Payer: No Typology Code available for payment source | Admitting: Neurology

## 2022-01-06 ENCOUNTER — Other Ambulatory Visit: Payer: Self-pay | Admitting: Nurse Practitioner

## 2022-01-08 ENCOUNTER — Inpatient Hospital Stay: Payer: No Typology Code available for payment source

## 2022-01-08 NOTE — Progress Notes (Signed)
? ?NEUROLOGY FOLLOW UP OFFICE NOTE ? ?[name] ?[id number] ? ?Assessment/Plan:  ? ?Migraine without aura, without status migrainosus, not intractable ?  ?Migraine prevention:  start taking topiramate 50mg  every night. ?Migraine rescue:  rizatriptan 10mg .  ?Limit use of pain relievers to no more than 2 days out of week to prevent risk of rebound or medication-overuse headache. ?Keep headache diary ?Follow up 9 months. ? ?Subjective:  ?[name] is a 56 year old right-handed Caucasian female with type 2 diabetes, chronic back pain, depression and anxiety and history of gastric bypass surgery who follows up for migraine. ?  ?UPDATE: ?Plan was to start [name] but insurance wouldn't cover it.  Instead, she was started on topiramate.  However, she only has been taking it as needed such as if it is a stressful week.   ?Intensity:  usually moderate, sometimes severe ?Duration:  1 to 2 hours with rizatriptan ?Frequency:  4 days a month ?However, she continues to have daily dull headache.   ?Current NSAIDS:  none ?Current analgesics:  Fentanyl patch (chronic back pain) ?Current triptans:  rizatriptan 10mg  ?Current ergotamine:  none ?Current anti-emetic:  promethazine 12.5mg  ?Current muscle relaxants: none ?Current anti-anxiolytic:  none ?Current sleep aide:  trazodone ?Current Antihypertensive medications:  none ?Current Antidepressant medications:  venlafaxine XR 37.5mg  daily, trazodone ?Current Anticonvulsant medications:  Topiramate 50mg  at bedtime ?Current anti-CGRP:none ?Current Vitamins/Herbal/Supplements: none ?Current Antihistamines/Decongestants:  none ?Other therapy:  none ?Hormone/birth control:  none ?  ?Caffeine:  Rarely drinks coffee.  No caffeinated soda. ?Diet:  Drinks a lot of water.  Drinks protein drink for breakfast and lunch.  Sometimes has decreased appetite since gastric bypass in 2013. ?Exercise:  Not routine (limited due to back pain) ?Depression: yes; Anxiety:  no ?Other pain:   Chronic back pain.  Sometimes neck pain. ?Sleep:  Good (due to medications) ?  ?HISTORY:  ?Onset:   After a fall in April 2020, hitting her head.  ?Location:  Often starts in the temples and may move across forehead or back of head ?Quality:  Starts as pressure and progresses to a sharp pain ?Initial Intensity:  Usually moderate, sometimes severe ?Aura:  none ?Premonitory Phase:  none ?Postdrome:  feels like she is in a fog ?Associated symptoms:  Occasional nausea, photophobia, phonophobia.  She denies associated visual disturbance, unilateral numbness or weakness. ?Initial Duration:  30 to 45 minutes ?Initial Frequency:  Daily, sometimes more than once a day ?Initial Frequency of abortive medication: 5 out of 7 days a week.  Excedrin at least twice a week, otherwise may take Tylenol ?Triggers:  no ?Relieving factors:  Excedrin ?Activity:  Aggravates ?  ?Previous history of different headaches, migraines that would put her in bed, with nausea, photophobia, phonophobia ?  ?  ?Past NSAIDS:  naproxen, Excedrin, Tylenol, Percocet (back pain) ?Past analgesics:  none ?Past abortive triptans:  none ?Past abortive ergotamine:  none ?Past muscle relaxants:  none ?Past anti-emetic:  Zofran ODT 4mg  ?Past antihypertensive medications:  propranolol ?Past antidepressant medications:  Cymbalta; citalopram, fluoxetine, Wellbutrin ?Past anticonvulsant medications:  Lyrica, gabapentin ?Past anti-CGRP:  Aimovig 70mg  (effective), Qulipta 60mg  ?Past vitamins/Herbal/Supplements:  magnesium citrate ?Past antihistamines/decongestants:  meclizine ?Other past therapies:  none ?  ?  ?Family history of headache:  No ?  ?Imaging (personally reviewed): ?01/02/2019 CT HEAD WO:  Stable right frontal encephalomalacia but no acute intracranial abnormality. ?09/21/2018 CTA HEAD & NECK:  Negative ? ?PAST MEDICAL HISTORY: ?Past Medical History:  ?Diagnosis Date  ? Anemia   ?  Anxiety and depression   ? Chronic back pain   ? DM2 (diabetes mellitus, type  2) (HCC)   ? improved since gastric bypass  ? Headache   ? stress/sinus - 2-3x/wk  ? History of left shoulder fracture   ? HLD (hyperlipidemia)   ? HTN (hypertension)   ? improved since gastric bypass  ? Hypothyroid   ? Left leg weakness   ? s/p back surgery  ? Migraines   ? Obesity   ? Pernicious anemia   ? S/P insertion of spinal cord stimulator   ? Seizure (HCC)   ? told she had a seizure as young child, not sure of context; mother states she didnt witness  ? Wears contact lenses   ? ? ?MEDICATIONS: ?Current Outpatient Medications on File Prior to Visit  ?Medication Sig Dispense Refill  ? cyanocobalamin (,VITAMIN B-12,) 1000 MCG/ML injection Inject 1 mL (1,000 mcg total) into the muscle every 30 (thirty) days. 1 mL 5  ? Estradiol-Norethindrone Acet 0.5-0.1 MG tablet Take 1 tablet by mouth daily. 28 tablet 11  ? fentaNYL (DURAGESIC) 25 MCG/HR 1 patch every 3 (three) days.    ? NEEDLE, DISP, 25 G (B-D DISP NEEDLE 25GX1") 25G X 1" MISC Used to give monlthly B-12 injections. 1 each 5  ? ondansetron (ZOFRAN-ODT) 4 MG disintegrating tablet Take 1 tablet (4 mg total) by mouth every 8 (eight) hours as needed for nausea or vomiting. 12 tablet 0  ? oxyCODONE-acetaminophen (PERCOCET) 5-325 MG tablet Take 1 tablet by mouth every 4 (four) hours as needed for severe pain. 12 tablet 0  ? promethazine (PHENERGAN) 12.5 MG tablet TAKE 1 TABLET (12.5 MG TOTAL) BY MOUTH EVERY 8 (EIGHT) HOURS AS NEEDED FOR NAUSEA OR VOMITING. 20 tablet 0  ? rizatriptan (MAXALT-MLT) 10 MG disintegrating tablet Take 1 tablet earliest onset of migraine.  May repeat in 2 hours if needed.  Maximum 2 tablets in 24 hours. 10 tablet 5  ? SYRINGE-NEEDLE, DISP, 3 ML 25G X 1" 3 ML MISC Used to give monthly B-12 injections. 1 each 5  ? topiramate (TOPAMAX) 25 MG tablet START TOPIRAMATE 25MG  AT BEDTIME FOR ONE WEEK, THEN INCREASE TO 50MG  AT BEDTIME. 60 tablet 0  ? traZODone (DESYREL) 100 MG tablet TAKE 1 TABLET BY MOUTH EVERYDAY AT BEDTIME 90 tablet 1  ?  venlafaxine XR (EFFEXOR-XR) 37.5 MG 24 hr capsule TAKE 1 CAPSULE BY MOUTH DAILY WITH BREAKFAST. 30 capsule 5  ? ?No current facility-administered medications on file prior to visit.  ? ? ?ALLERGIES: ?Allergies  ?Allergen Reactions  ? Latex Rash  ? Penicillins Itching and Swelling  ?  Has patient had a PCN reaction causing immediate rash, facial/tongue/throat swelling, SOB or lightheadedness with hypotension: yes ?Has patient had a PCN reaction causing severe rash involving mucus membranes or skin necrosis: no ?Has patient had a PCN reaction that required hospitalization : yes ed visit ?Has patient had a PCN reaction occurring within the last 10 years: yes ?If all of the above answers are "NO", then may proceed with Cephalosporin use. ?  ? ? ?FAMILY HISTORY: ?Family History  ?Problem Relation Age of Onset  ? Lung cancer Father   ? Diabetes Mother   ? Heart failure Mother   ? Diabetes Sister   ? Fibromyalgia Sister   ? Colon cancer Maternal Aunt   ? Skin cancer Paternal Aunt   ? Skin cancer Paternal Uncle   ? ? ?  ?Objective:  ?Blood pressure 121/68, pulse 62, height 5'  5" (1.651 m), weight 194 lb 12.8 oz (88.4 kg), last menstrual period 11/28/2019, SpO2 98 %. ?General: No acute distress.  Patient appears well-groomed.   ?Head:  Normocephalic/atraumatic ?Eyes:  Fundi examined but not visualized ?Heart:  Regular rate and rhythm ?Neurological Exam: alert and oriented to person, place, and time.  Speech fluent and not dysarthric, language intact.  CN II-XII intact. Bulk and tone normal, muscle strength 5/5 throughout.  Sensation to light touch intact.  Deep tendon reflexes 2+ throughout, toes downgoing.  Finger to nose testing intact.  Gait normal, Romberg negative. ? ? ?Shon MilletAdam Daemyn Gariepy, DO ? ?CC: Rennie PlowmanMargaret Arnett, FNP ? ? ? ? ? ? ?

## 2022-01-09 ENCOUNTER — Encounter: Payer: Self-pay | Admitting: Oncology

## 2022-01-10 ENCOUNTER — Inpatient Hospital Stay: Payer: No Typology Code available for payment source | Admitting: Oncology

## 2022-01-10 ENCOUNTER — Ambulatory Visit (INDEPENDENT_AMBULATORY_CARE_PROVIDER_SITE_OTHER): Payer: PRIVATE HEALTH INSURANCE | Admitting: Neurology

## 2022-01-10 ENCOUNTER — Encounter: Payer: Self-pay | Admitting: Neurology

## 2022-01-10 VITALS — BP 121/68 | HR 62 | Ht 65.0 in | Wt 194.8 lb

## 2022-01-10 DIAGNOSIS — G43009 Migraine without aura, not intractable, without status migrainosus: Secondary | ICD-10-CM | POA: Diagnosis not present

## 2022-01-10 NOTE — Patient Instructions (Signed)
?  Topiramate 50mg at bedtime Take rizatriptan 10mg at earliest onset of headache.  May repeat dose once in 2 hours if needed.  Maximum 2 tablets in 24 hours. Limit use of pain relievers to no more than 2 days out of the week.  These medications include acetaminophen, NSAIDs (ibuprofen/Advil/Motrin, naproxen/Aleve, triptans (Imitrex/sumatriptan), Excedrin, and narcotics.  This will help reduce risk of rebound headaches. Be aware of common food triggers:  - Caffeine:  coffee, black tea, cola, Mt. Dew  - Chocolate  - Dairy:  aged cheeses (brie, blue, cheddar, gouda, Parmasan, provolone, romano, Swiss, etc), chocolate milk, buttermilk, sour cream, limit eggs and yogurt  - Nuts, peanut butter  - Alcohol  - Cereals/grains:  FRESH breads (fresh bagels, sourdough, doughnuts), yeast productions  - Processed/canned/aged/cured meats (pre-packaged deli meats, hotdogs)  - MSG/glutamate:  soy sauce, flavor enhancer, pickled/preserved/marinated foods  - Sweeteners:  aspartame (Equal, Nutrasweet).  Sugar and Splenda are okay  - Vegetables:  legumes (lima beans, lentils, snow peas, fava beans, pinto peans, peas, garbanzo beans), sauerkraut, onions, olives, pickles  - Fruit:  avocados, bananas, citrus fruit (orange, lemon, grapefruit), mango  - Other:  Frozen meals, macaroni and cheese Routine exercise Stay adequately hydrated (aim for 64 oz water daily) Keep headache diary Maintain proper stress management Maintain proper sleep hygiene Do not skip meals Consider supplements:  magnesium citrate 400mg daily, riboflavin 400mg daily, coenzyme Q10 100mg three times daily.  

## 2022-01-22 ENCOUNTER — Inpatient Hospital Stay: Payer: No Typology Code available for payment source

## 2022-01-24 ENCOUNTER — Inpatient Hospital Stay: Payer: No Typology Code available for payment source | Admitting: Oncology

## 2022-01-31 ENCOUNTER — Other Ambulatory Visit (HOSPITAL_COMMUNITY): Payer: Self-pay | Admitting: Neurosurgery

## 2022-01-31 ENCOUNTER — Other Ambulatory Visit: Payer: Self-pay | Admitting: Neurosurgery

## 2022-01-31 DIAGNOSIS — M5126 Other intervertebral disc displacement, lumbar region: Secondary | ICD-10-CM

## 2022-02-04 ENCOUNTER — Telehealth: Payer: Self-pay | Admitting: Neurology

## 2022-02-04 NOTE — Telephone Encounter (Signed)
Patient is on fentanyl  patches with the pain management  clinic. They are weaning her off of it because they think she is having rebound HA from that. They told her to call jaffe and see about getting on botox shots.

## 2022-02-04 NOTE — Telephone Encounter (Signed)
Per patient the topiramate helping but she having Rebound headaches from the patches.  Patient advised Dr.Jaffe is out of the office. I will call her back to let her know what he decide.  If they haven't discuss this in her past appts she may have to come in and discuss then we go from there.

## 2022-02-08 ENCOUNTER — Other Ambulatory Visit: Payer: Self-pay | Admitting: Family

## 2022-02-08 DIAGNOSIS — G4700 Insomnia, unspecified: Secondary | ICD-10-CM

## 2022-02-08 DIAGNOSIS — F418 Other specified anxiety disorders: Secondary | ICD-10-CM

## 2022-02-11 NOTE — Telephone Encounter (Signed)
Patient advised, She does not meet criteria for Botox at this time.  We can increase topiramate to 100mg  at bedtime.   Patient just refilled her bottle, Will call when she runs out.

## 2022-02-12 ENCOUNTER — Inpatient Hospital Stay: Payer: No Typology Code available for payment source | Attending: Oncology | Admitting: Oncology

## 2022-02-28 ENCOUNTER — Ambulatory Visit
Admission: RE | Admit: 2022-02-28 | Discharge: 2022-02-28 | Disposition: A | Discharge status: Home / Self Care | Payer: PRIVATE HEALTH INSURANCE | Source: Ambulatory Visit | Attending: Neurosurgery | Admitting: Neurosurgery

## 2022-02-28 DIAGNOSIS — M5126 Other intervertebral disc displacement, lumbar region: Secondary | ICD-10-CM | POA: Insufficient documentation

## 2022-03-10 ENCOUNTER — Encounter: Payer: Self-pay | Admitting: Oncology

## 2022-03-26 ENCOUNTER — Other Ambulatory Visit: Payer: Self-pay

## 2022-03-26 DIAGNOSIS — R519 Headache, unspecified: Secondary | ICD-10-CM

## 2022-03-26 MED ORDER — VENLAFAXINE HCL ER 37.5 MG PO CP24: 37.5000 mg | ORAL_CAPSULE | Freq: Every day | ORAL | 5 refills | Status: DC

## 2022-03-28 ENCOUNTER — Telehealth: Payer: Self-pay | Admitting: Family

## 2022-03-28 NOTE — Telephone Encounter (Signed)
Call pt  Due for f/u appt Last appt 03/2021 She needs an appt for refills

## 2022-03-28 NOTE — Telephone Encounter (Signed)
Spoke to patient and scheduled appt for f/u and refills on 05/14/22

## 2022-04-11 ENCOUNTER — Other Ambulatory Visit: Payer: Self-pay

## 2022-04-11 DIAGNOSIS — R519 Headache, unspecified: Secondary | ICD-10-CM

## 2022-04-11 MED ORDER — VENLAFAXINE HCL ER 37.5 MG PO CP24: 37.5000 mg | ORAL_CAPSULE | Freq: Every day | ORAL | 5 refills | Status: DC

## 2022-04-21 ENCOUNTER — Encounter: Payer: Self-pay | Admitting: Oncology

## 2022-05-02 ENCOUNTER — Other Ambulatory Visit: Payer: Self-pay | Admitting: Certified Nurse Midwife

## 2022-05-05 ENCOUNTER — Encounter: Payer: Self-pay | Admitting: Certified Nurse Midwife

## 2022-05-09 ENCOUNTER — Other Ambulatory Visit: Payer: Self-pay | Admitting: Neurology

## 2022-05-09 NOTE — Telephone Encounter (Signed)
Per note 01/2022, She does not meet criteria for Botox at this time.  We can increase topiramate to 100mg  at bedtime.  Patient just refilled her bottle, Will call when she runs out.  Topiramate 100 mg at bedtime sent into the pharmacy until her appt 09/2022.

## 2022-05-14 ENCOUNTER — Encounter: Payer: Self-pay | Admitting: Family

## 2022-05-14 ENCOUNTER — Telehealth: Payer: Self-pay | Admitting: Family

## 2022-05-14 ENCOUNTER — Ambulatory Visit (INDEPENDENT_AMBULATORY_CARE_PROVIDER_SITE_OTHER): Payer: PRIVATE HEALTH INSURANCE | Admitting: Family

## 2022-05-14 VITALS — BP 118/78 | HR 55 | Temp 98.0°F | Ht 66.0 in | Wt 189.0 lb

## 2022-05-14 DIAGNOSIS — R519 Headache, unspecified: Secondary | ICD-10-CM

## 2022-05-14 DIAGNOSIS — D509 Iron deficiency anemia, unspecified: Secondary | ICD-10-CM

## 2022-05-14 DIAGNOSIS — F418 Other specified anxiety disorders: Secondary | ICD-10-CM | POA: Diagnosis not present

## 2022-05-14 DIAGNOSIS — E538 Deficiency of other specified B group vitamins: Secondary | ICD-10-CM

## 2022-05-14 DIAGNOSIS — G8929 Other chronic pain: Secondary | ICD-10-CM | POA: Diagnosis not present

## 2022-05-14 LAB — B12 AND FOLATE PANEL
Folate: 7.2 ng/mL (ref >5.9)
Vitamin B-12: 192 pg/mL — ABNORMAL LOW (ref 211–911)

## 2022-05-14 LAB — IBC + FERRITIN
Ferritin: 36.9 ng/mL (ref 10.0–291.0)
Iron: 129 ug/dL (ref 42–145)
Saturation Ratios: 37.8 % (ref 20.0–50.0)
TIBC: 341.6 ug/dL (ref 250.0–450.0)
Transferrin: 244 mg/dL (ref 212.0–360.0)

## 2022-05-14 LAB — COMPREHENSIVE METABOLIC PANEL
ALT: 28 U/L (ref 0–35)
AST: 31 U/L (ref 0–37)
Albumin: 3.6 g/dL (ref 3.5–5.2)
Alkaline Phosphatase: 104 U/L (ref 39–117)
BUN: 16 mg/dL (ref 6–23)
CO2: 25 mEq/L (ref 19–32)
Calcium: 8.5 mg/dL (ref 8.4–10.5)
Chloride: 111 mEq/L (ref 96–112)
Creatinine, Ser: 0.94 mg/dL (ref 0.40–1.20)
GFR: 68.08 mL/min (ref >60.00)
Glucose, Bld: 73 mg/dL (ref 70–99)
Potassium: 3.5 mEq/L (ref 3.5–5.1)
Sodium: 143 mEq/L (ref 135–145)
Total Bilirubin: 0.4 mg/dL (ref 0.2–1.2)
Total Protein: 6.3 g/dL (ref 6.0–8.3)

## 2022-05-14 LAB — CBC WITH DIFFERENTIAL/PLATELET
Basophils Absolute: 0 10*3/uL (ref 0.0–0.1)
Basophils Relative: 0.6 % (ref 0.0–3.0)
Eosinophils Absolute: 0.1 10*3/uL (ref 0.0–0.7)
Eosinophils Relative: 2.8 % (ref 0.0–5.0)
HCT: 36.5 % (ref 36.0–46.0)
Hemoglobin: 11.9 g/dL — ABNORMAL LOW (ref 12.0–15.0)
Lymphocytes Relative: 29.8 % (ref 12.0–46.0)
Lymphs Abs: 1.4 10*3/uL (ref 0.7–4.0)
MCHC: 32.7 g/dL (ref 30.0–36.0)
MCV: 94.2 fl (ref 78.0–100.0)
Monocytes Absolute: 0.3 10*3/uL (ref 0.1–1.0)
Monocytes Relative: 5.3 % (ref 3.0–12.0)
Neutro Abs: 3 10*3/uL (ref 1.4–7.7)
Neutrophils Relative %: 61.5 % (ref 43.0–77.0)
Platelets: 149 10*3/uL — ABNORMAL LOW (ref 150.0–400.0)
RBC: 3.87 Mil/uL (ref 3.87–5.11)
RDW: 13.4 % (ref 11.5–15.5)
WBC: 4.9 10*3/uL (ref 4.0–10.5)

## 2022-05-14 LAB — TSH: TSH: 3.13 u[IU]/mL (ref 0.35–5.50)

## 2022-05-14 LAB — LIPID PANEL
Cholesterol: 106 mg/dL (ref 0–200)
HDL: 46.5 mg/dL (ref >39.00)
LDL Cholesterol: 50 mg/dL (ref 0–99)
NonHDL: 59.09
Total CHOL/HDL Ratio: 2
Triglycerides: 44 mg/dL (ref 0.0–149.0)
VLDL: 8.8 mg/dL (ref 0.0–40.0)

## 2022-05-14 LAB — HEMOGLOBIN A1C: Hgb A1c MFr Bld: 5.7 % (ref 4.6–6.5)

## 2022-05-14 LAB — VITAMIN D 25 HYDROXY (VIT D DEFICIENCY, FRACTURES): VITD: 23.06 ng/mL — ABNORMAL LOW (ref 30.00–100.00)

## 2022-05-14 MED ORDER — CYANOCOBALAMIN 1000 MCG/ML IJ SOLN: INTRAMUSCULAR | 3 refills | Status: DC

## 2022-05-14 MED ORDER — VENLAFAXINE HCL ER 75 MG PO CP24: 75.0000 mg | ORAL_CAPSULE | Freq: Every day | ORAL | 1 refills | Status: DC

## 2022-05-14 NOTE — Assessment & Plan Note (Signed)
Chronic, overall stable.  She will continue close follow-up with [facility], Rulon Abide, NP  whom prescribes fentanyl patch 12.5mg ,  gabapentin 100mg  qhs  and oxycodone 10mg 

## 2022-05-14 NOTE — Patient Instructions (Addendum)
Please ensure that you call gastroenterology, Dr. Annabell Sabal office, to reschedule [phone]  Increase Effexor to 75 mg.  Let me know how you are doing

## 2022-05-14 NOTE — Progress Notes (Signed)
Subjective:    Patient ID: [name], female    DOB: October 08, 1965, 56 y.o.   MRN: [id number]  CC: [name] is a 56 y.o. female who presents today for follow up.   HPI: She complains of increased irritability.  Feels 'on edge'. Stress at work. Caring for mother who cannot stay alone and recently hospitalized.  She has a very supportive partner, [name]. Endorses trouble sleeping.  More anxiety than depression.   No si/hi       A year ago weaned off of Prozac and started Effexor 37.5 mg.  She remains compliant with trazodone 100 mg   she has previously tried Wellbutrin, Cymbalta, Effexor, trazodone, Prozac   colonoscopy to be rescheduled  Anemia resolved, hemoglobin 12.9 on  October 14, 2021  She is no longer on iron, b12 .  She would like to resume B12 at this is previously been helpful for fatigue.   Chronic low back pain-stable at this time.  She is previously failed injections.  She does not want to undergo surgery again .following with Rulon Abide at [facility] for pain management whom prescribes fentanyl patch 12.5mg ,  gabapentin 100mg  qhs  and oxycodone 10mg    She is taking topomax 100mg  qhs as prescribed by Dr  She has CPE with for pap.    HISTORY:  Past Medical History:  Diagnosis Date   Anemia    Anxiety and depression    Chronic back pain    DM2 (diabetes mellitus, type 2) (HCC)    improved since gastric bypass   Headache    stress/sinus - 2-3x/wk   History of left shoulder fracture    HLD (hyperlipidemia)    HTN (hypertension)    improved since gastric bypass   Hypothyroid    Left leg weakness    s/p back surgery   Migraines    Obesity    Pernicious anemia    S/P insertion of spinal cord stimulator    Seizure (HCC)    told she had a seizure as young child, not sure of context; mother states she didnt witness   Wears contact lenses    Past Surgical History:  Procedure Laterality Date   Abd  - gallstones  5/03   BACK SURGERY  2009   decompressive laminectomy   BARIATRIC SURGERY  03/02/2012   gastric bypass; Dr 7/03   CHOLECYSTECTOMY  6/03   COLONOSCOPY WITH PROPOFOL N/A 05/01/2017   Procedure: COLONOSCOPY WITH PROPOFOL;  Surgeon: Smitty Cords, MD;  Location: [facility];  Service: Gastroenterology;  Laterality: N/A;   DILATION AND CURETTAGE OF UTERUS  1/05   ESOPHAGOGASTRODUODENOSCOPY (EGD) WITH PROPOFOL N/A 05/01/2017   Procedure: ESOPHAGOGASTRODUODENOSCOPY (EGD) WITH PROPOFOL;  Surgeon: [facility], MD;  Location: [facility];  Service: Gastroenterology;  Laterality: N/A;   KNEE SURGERY     L - arthroscopic   LIVER BIOPSY  6/03   fatty liver   LUMBAR SPINE SURGERY  2015   Dr. [facility]   NASAL SINUS SURGERY     PANNICULECTOMY  08/2013   [facility], Dr. 2016   SHOULDER ARTHROSCOPY Left 10/06/2019   sleep study - apnea  04/2001   improved since gastric bypass   Family History  Problem Relation Age of Onset   Lung cancer Father    Diabetes Mother    Heart failure Mother    Diabetes Sister    Fibromyalgia Sister    Colon cancer Maternal Aunt  Skin cancer Paternal Aunt    Skin cancer Paternal Uncle     Allergies: Latex and Penicillins Current Outpatient Medications on File Prior to Visit  Medication Sig Dispense Refill   Estradiol-Norethindrone Acet 0.5-0.1 MG tablet TAKE 1 TABLET BY MOUTH EVERY DAY 84 tablet 3   fentaNYL (DURAGESIC) 25 MCG/HR 1 patch every 3 (three) days. Patient is taking 12mg  instead of 25 mg     gabapentin (NEURONTIN) 100 MG capsule Take 100 mg by mouth at bedtime.     ondansetron (ZOFRAN-ODT) 4 MG disintegrating tablet Take 1 tablet (4 mg total) by mouth every 8 (eight) hours as needed for nausea or vomiting. 12 tablet 0   Oxycodone HCl 10 MG TABS SMARTSIG:1 Tablet(s) By Mouth Every 8-12 Hours PRN     promethazine (PHENERGAN) 12.5 MG tablet TAKE 1 TABLET (12.5 MG TOTAL) BY MOUTH EVERY 8 (EIGHT) HOURS AS NEEDED FOR NAUSEA OR  VOMITING. 20 tablet 0   rizatriptan (MAXALT-MLT) 10 MG disintegrating tablet Take 1 tablet earliest onset of migraine.  May repeat in 2 hours if needed.  Maximum 2 tablets in 24 hours. 10 tablet 5   topiramate (TOPAMAX) 100 MG tablet Take 1 tablet (100 mg total) by mouth daily. 30 tablet 5   traZODone (DESYREL) 100 MG tablet TAKE 1 TABLET BY MOUTH EVERYDAY AT BEDTIME 90 tablet 1   No current facility-administered medications on file prior to visit.    Social History   Tobacco Use   Smoking status: Never   Smokeless tobacco: Never   Tobacco comments:    tobacco use - no  Vaping Use   Vaping Use: Never used  Substance Use Topics   Alcohol use: No   Drug use: No    Review of Systems  Constitutional:  Negative for chills and fever.  Respiratory:  Negative for cough.   Cardiovascular:  Negative for chest pain and palpitations.  Gastrointestinal:  Negative for nausea and vomiting.  Musculoskeletal:  Positive for back pain.  Psychiatric/Behavioral:  Positive for sleep disturbance. Negative for suicidal ideas. The patient is nervous/anxious.       Objective:    BP 118/78 (BP Location: Left Arm, Patient Position: Sitting, Cuff Size: Normal)   Pulse (!) 55   Temp 98 F (36.7 C) (Oral)   Ht 5\' 6"  (1.676 m)   Wt 189 lb (85.7 kg)   LMP 11/28/2019 (Approximate)   SpO2 99%   BMI 30.51 kg/m  BP Readings from Last 3 Encounters:  05/14/22 118/78  01/10/22 121/68  10/14/21 (!) 158/78   Wt Readings from Last 3 Encounters:  05/14/22 189 lb (85.7 kg)  01/10/22 194 lb 12.8 oz (88.4 kg)  10/14/21 199 lb 15.3 oz (90.7 kg)    Physical Exam Vitals reviewed.  Constitutional:      Appearance: She is well-developed.  Eyes:     Conjunctiva/sclera: Conjunctivae normal.  Cardiovascular:     Rate and Rhythm: Normal rate and regular rhythm.     Pulses: Normal pulses.     Heart sounds: Normal heart sounds.  Pulmonary:     Effort: Pulmonary effort is normal.     Breath sounds: Normal  breath sounds. No wheezing, rhonchi or rales.  Skin:    General: Skin is warm and dry.  Neurological:     Mental Status: She is alert.  Psychiatric:        Speech: Speech normal.        Behavior: Behavior normal.  Thought Content: Thought content normal.        Assessment & Plan:   Problem List Items Addressed This Visit       Other   B12 deficiency    History of B12 deficiency.  Pending B12 studies today.  Advised patient may resume B12 injections monthly at home his fatigue had improved when she was on B12.  Will monitor      Chronic pain    Chronic, overall stable.  She will continue close follow-up with [facility], Rulon Abide, NP  whom prescribes fentanyl patch 12.5mg ,  gabapentin 100mg  qhs  and oxycodone 10mg        Relevant Medications   Oxycodone HCl 10 MG TABS   gabapentin (NEURONTIN) 100 MG capsule   venlafaxine XR (EFFEXOR-XR) 75 MG 24 hr capsule   Depression with anxiety - Primary    Uncontrolled.  Work and stress as caregiver.  We agreed to increase Effexor to 75 mg.  Continue trazodone 100 mg.  Close follow-up      Relevant Medications   venlafaxine XR (EFFEXOR-XR) 75 MG 24 hr capsule   Other Relevant Orders   TSH   CBC with Differential/Platelet   Comprehensive metabolic panel   Hemoglobin A1c   Lipid panel   VITAMIN D 25 Hydroxy (Vit-D Deficiency, Fractures)   B12 and Folate Panel   IBC + Ferritin   Headache   Relevant Medications   Oxycodone HCl 10 MG TABS   gabapentin (NEURONTIN) 100 MG capsule   venlafaxine XR (EFFEXOR-XR) 75 MG 24 hr capsule   Iron deficiency anemia    Lab Results  Component Value Date   HGB 12.9 10/14/2021  Resolved.  Pending ferritin, CBC to monitor.  Patient understands the importance of calling [facility] to reschedule colonoscopy        I have discontinued Mariena C. Givan's B-D DISP NEEDLE 25GX1", cyanocobalamin, and SYRINGE-NEEDLE (DISP) 3 ML. I have also changed her venlafaxine XR. Additionally, I am  having her maintain her fentaNYL, rizatriptan, promethazine, ondansetron, traZODone, Estradiol-Norethindrone Acet, topiramate, Oxycodone HCl, and gabapentin.   Meds ordered this encounter  Medications   venlafaxine XR (EFFEXOR-XR) 75 MG 24 hr capsule    Sig: Take 1 capsule (75 mg total) by mouth daily with breakfast.    Dispense:  90 capsule    Refill:  1    Order Specific Question:   Supervising Provider    Answer:   [[id number]]    Return precautions given.   Risks, benefits, and alternatives of the medications and treatment plan prescribed today were discussed, and patient expressed understanding.   Education regarding symptom management and diagnosis given to patient on AVS.  Continue to follow with 10/16/2021, FNP for routine health maintenance.   Sherlene Shams and I agreed with plan.   Allegra Grana, FNP

## 2022-05-14 NOTE — Assessment & Plan Note (Signed)
History of B12 deficiency.  Pending B12 studies today.  Advised patient may resume B12 injections monthly at home his fatigue had improved when she was on B12.  Will monitor

## 2022-05-14 NOTE — Assessment & Plan Note (Signed)
Lab Results  Component Value Date   HGB 12.9 10/14/2021   Resolved.  Pending ferritin, CBC to monitor.  Patient understands the importance of calling [facility] to reschedule colonoscopy

## 2022-05-14 NOTE — Assessment & Plan Note (Addendum)
Uncontrolled.  Work and stress as caregiver.  We agreed to increase Effexor to 75 mg.  Continue trazodone 100 mg.  Close follow-up

## 2022-05-14 NOTE — Telephone Encounter (Signed)
I had meant to ask you before patient had left to reorder B12 needles for pt. Not sure which needles she needs for IM injection at home. I refilled b12.

## 2022-05-15 MED ORDER — EASY TOUCH FLIPLOCK NEEDLES 25G X 1" MISC
0 refills | Status: AC
Start: 2022-05-15 — End: ?

## 2022-05-15 MED ORDER — SYRINGE 25G X 1" 3 ML MISC
0 refills | Status: AC
Start: 2022-05-15 — End: ?

## 2022-05-15 NOTE — Telephone Encounter (Signed)
Done patient has been notified

## 2022-05-28 ENCOUNTER — Ambulatory Visit (INDEPENDENT_AMBULATORY_CARE_PROVIDER_SITE_OTHER): Payer: Self-pay | Admitting: Certified Nurse Midwife

## 2022-05-28 ENCOUNTER — Encounter: Payer: Self-pay | Admitting: Certified Nurse Midwife

## 2022-05-28 ENCOUNTER — Telehealth: Payer: Self-pay

## 2022-05-28 VITALS — BP 116/71 | HR 77 | Ht 65.5 in | Wt 199.6 lb

## 2022-05-28 DIAGNOSIS — Z1231 Encounter for screening mammogram for malignant neoplasm of breast: Secondary | ICD-10-CM

## 2022-05-28 DIAGNOSIS — Z01419 Encounter for gynecological examination (general) (routine) without abnormal findings: Secondary | ICD-10-CM

## 2022-05-28 MED ORDER — ESTRADIOL-NORETHINDRONE ACET 0.5-0.1 MG PO TABS: 1.0000 | ORAL_TABLET | Freq: Every day | ORAL | 3 refills | Status: DC

## 2022-05-28 NOTE — Telephone Encounter (Signed)
Patient returned office phone call. 

## 2022-05-28 NOTE — Telephone Encounter (Signed)
LVM to go over results

## 2022-05-28 NOTE — Progress Notes (Signed)
GYNECOLOGY ANNUAL PREVENTATIVE CARE ENCOUNTER NOTE  History:     [name] is a 56 y.o. G0P0000 female here for a routine annual gynecologic exam.  Current complaints: none.   Denies abnormal vaginal bleeding, discharge, pelvic pain, problems with intercourse or other gynecologic concerns.     Social Relationship: married ( female partner)  Living: with wife Work: Engineer, drilling  Exercise: walking dog /active at work  Smoke/Alcohol/drug use: denies use   Gynecologic History Patient's last menstrual period was 11/28/2019 (approximate). Contraception: post menopausal status Last Pap: 02/2019. Results were: normal with negative HPV Last mammogram: has not done .    Obstetric History OB History  Gravida Para Term Preterm AB Living  0 0 0 0 0 0  SAB IAB Ectopic Multiple Live Births  0 0 0 0 0    Past Medical History:  Diagnosis Date   Anemia    Anxiety and depression    Chronic back pain    DM2 (diabetes mellitus, type 2) (HCC)    improved since gastric bypass   Headache    stress/sinus - 2-3x/wk   History of left shoulder fracture    HLD (hyperlipidemia)    HTN (hypertension)    improved since gastric bypass   Hypothyroid    Left leg weakness    s/p back surgery   Migraines    Obesity    Pernicious anemia    S/P insertion of spinal cord stimulator    Seizure (HCC)    told she had a seizure as young child, not sure of context; mother states she didnt witness   Wears contact lenses     Past Surgical History:  Procedure Laterality Date   Abd [place] - gallstones  5/03   BACK SURGERY  2009   decompressive laminectomy   BARIATRIC SURGERY  03/02/2012   gastric bypass; Dr Smitty Cords   CHOLECYSTECTOMY  6/03   COLONOSCOPY WITH PROPOFOL N/A 05/01/2017   Procedure: COLONOSCOPY WITH PROPOFOL;  Surgeon: Midge Minium, MD;  Location: [facility];  Service: Gastroenterology;  Laterality: N/A;   DILATION AND CURETTAGE OF UTERUS  1/05    ESOPHAGOGASTRODUODENOSCOPY (EGD) WITH PROPOFOL N/A 05/01/2017   Procedure: ESOPHAGOGASTRODUODENOSCOPY (EGD) WITH PROPOFOL;  Surgeon: Midge Minium, MD;  Location: [facility];  Service: Gastroenterology;  Laterality: N/A;   KNEE SURGERY     L - arthroscopic   LIVER BIOPSY  6/03   fatty liver   LUMBAR SPINE SURGERY  2015   Dr. Amalia Greenhouse   NASAL SINUS SURGERY     PANNICULECTOMY  08/2013   [facility], Dr. Diamond Nickel   SHOULDER ARTHROSCOPY Left 10/06/2019   sleep study - apnea  04/2001   improved since gastric bypass    Current Outpatient Medications on File Prior to Visit  Medication Sig Dispense Refill   cyanocobalamin (VITAMIN B12) 1000 MCG/ML injection 1000 mcg (1 mL) intramuscular injection in the thigh ( vastus lateralis) once per month. 4 mL 3   Estradiol-Norethindrone Acet 0.5-0.1 MG tablet TAKE 1 TABLET BY MOUTH EVERY DAY 84 tablet 3   fentaNYL (DURAGESIC) 25 MCG/HR 1 patch every 3 (three) days. Patient is taking 12mg  instead of 25 mg     gabapentin (NEURONTIN) 100 MG capsule Take 100 mg by mouth at bedtime.     NEEDLE, DISP, 25 G (EASY TOUCH FLIPLOCK NEEDLES) 25G X 1" MISC Used to give b12 injections monthly 50 each 0   ondansetron (ZOFRAN-ODT) 4 MG disintegrating tablet Take 1 tablet (4 mg  total) by mouth every 8 (eight) hours as needed for nausea or vomiting. 12 tablet 0   Oxycodone HCl 10 MG TABS SMARTSIG:1 Tablet(s) By Mouth Every 8-12 Hours PRN     promethazine (PHENERGAN) 12.5 MG tablet TAKE 1 TABLET (12.5 MG TOTAL) BY MOUTH EVERY 8 (EIGHT) HOURS AS NEEDED FOR NAUSEA OR VOMITING. 20 tablet 0   rizatriptan (MAXALT-MLT) 10 MG disintegrating tablet Take 1 tablet earliest onset of migraine.  May repeat in 2 hours if needed.  Maximum 2 tablets in 24 hours. 10 tablet 5   Syringe/Needle, Disp, (SYRINGE 3CC/25GX1") 25G X 1" 3 ML MISC Use to give b12 injections monthly 50 each 0   topiramate (TOPAMAX) 100 MG tablet Take 1 tablet (100 mg total) by mouth daily. 30 tablet 5   traZODone  (DESYREL) 100 MG tablet TAKE 1 TABLET BY MOUTH EVERYDAY AT BEDTIME 90 tablet 1   venlafaxine XR (EFFEXOR-XR) 75 MG 24 hr capsule Take 1 capsule (75 mg total) by mouth daily with breakfast. 90 capsule 1   No current facility-administered medications on file prior to visit.    Allergies  Allergen Reactions   Latex Rash   Penicillins Itching and Swelling    Has patient had a PCN reaction causing immediate rash, facial/tongue/throat swelling, SOB or lightheadedness with hypotension: yes Has patient had a PCN reaction causing severe rash involving mucus membranes or skin necrosis: no Has patient had a PCN reaction that required hospitalization : yes ed visit Has patient had a PCN reaction occurring within the last 10 years: yes If all of the above answers are "NO", then may proceed with Cephalosporin use.     Social History:  reports that she has never smoked. She has never used smokeless tobacco. She reports that she does not drink alcohol and does not use drugs.  Family History  Problem Relation Age of Onset   Lung cancer Father    Diabetes Mother    Heart failure Mother    Diabetes Sister    Fibromyalgia Sister    Colon cancer Maternal Aunt    Skin cancer Paternal Aunt    Skin cancer Paternal Uncle     The following portions of the patient's history were reviewed and updated as appropriate: allergies, current medications, past family history, past medical history, past social history, past surgical history and problem list.  Review of Systems Pertinent items noted in HPI and remainder of comprehensive ROS otherwise negative.  Physical Exam:  BP 116/71   Pulse 77   Ht 5' 5.5" (1.664 m)   Wt 199 lb 9.6 oz (90.5 kg)   LMP 11/28/2019 (Approximate)   BMI 32.71 kg/m  CONSTITUTIONAL: Well-developed, well-nourished female in no acute distress.  HENT:  Normocephalic, atraumatic, External right and left ear normal. Oropharynx is clear and moist EYES: Conjunctivae and EOM are  normal. Pupils are equal, round, and reactive to light. No scleral icterus.  NECK: Normal range of motion, supple, no masses.  Normal thyroid.  SKIN: Skin is warm and dry. No rash noted. Not diaphoretic. No erythema. No pallor. MUSCULOSKELETAL: Normal range of motion. No tenderness.  No cyanosis, clubbing, or edema.  2+ distal pulses. NEUROLOGIC: Alert and oriented to person, place, and time. Normal reflexes, muscle tone coordination.  PSYCHIATRIC: Normal mood and affect. Normal behavior. Normal judgment and thought content. CARDIOVASCULAR: Normal heart rate noted, regular rhythm RESPIRATORY: Clear to auscultation bilaterally. Effort and breath sounds normal, no problems with respiration noted. BREASTS: Symmetric in size. No masses, tenderness,  skin changes, nipple drainage, or lymphadenopathy bilaterally.  ABDOMEN: Soft, no distention noted.  No tenderness, rebound or guarding.  PELVIC: Normal appearing external genitalia and urethral meatus; normal appearing vaginal mucosa and cervix.  No abnormal discharge noted.  Pap smear not due.  Normal uterine size, no other palpable masses, no uterine or adnexal tenderness.  .   Assessment and Plan:    1. Well woman exam with routine gynecological exam   Pap: not due  Mammogram : ordered, encourage pt to have done  Labs: done by PCP Refills:HRT, discussed taking every other day  Referral: none  Routine preventative health maintenance measures emphasized. Please refer to After Visit Summary for other counseling recommendations.      Doreene Burke, CNM [facility],  [facility]

## 2022-05-30 NOTE — Telephone Encounter (Signed)
Pt returning call and she stated to call on her cell phone

## 2022-05-30 NOTE — Telephone Encounter (Signed)
Called patient on her cellphone as instructed but was not able to leave message .

## 2022-06-17 NOTE — Telephone Encounter (Signed)
Spoke to patient about results and she has appt scheduled for 06/25/22

## 2022-06-25 ENCOUNTER — Encounter: Payer: Self-pay | Admitting: Family

## 2022-06-25 ENCOUNTER — Ambulatory Visit (INDEPENDENT_AMBULATORY_CARE_PROVIDER_SITE_OTHER): Payer: PRIVATE HEALTH INSURANCE | Admitting: Family

## 2022-06-25 VITALS — BP 118/78 | HR 55 | Temp 98.2°F | Ht 65.5 in | Wt 189.9 lb

## 2022-06-25 DIAGNOSIS — F418 Other specified anxiety disorders: Secondary | ICD-10-CM

## 2022-06-25 DIAGNOSIS — E538 Deficiency of other specified B group vitamins: Secondary | ICD-10-CM

## 2022-06-25 MED ORDER — BUSPIRONE HCL 5 MG PO TABS: 5.0000 mg | ORAL_TABLET | Freq: Two times a day (BID) | ORAL | 2 refills | Status: DC

## 2022-06-25 NOTE — Patient Instructions (Addendum)
For anxiety, start BuSpar 5 mg twice daily.  Please let me know how you are doing   My lab note:   Normal electrolytes and kidney function.  Slight anemia.  I question if low B12 is contributing.  Please resume B12 injection monthly and I have reordered today.  Your iron stores are also quite low.  I would recommend calling Dr. Elroy Channel office [phone]) to schedule follow-up  as you  may also benefit again from an iron infusion.   Vitamin D is slightly low. Please start cholecalciferol 800 units daily for next 3 to 4 months. You may find this over the counter in the drug store. Please call to make an appointment for 3 to 4 months out so we can recheck. Also, please ensure you are following a diet high in calcium -- research shows better outcomes with dietary sources including kale, yogurt, broccolii, cheese, okra, almonds- to name a few.   You remain prediabetic.  Cholesterol is acceptable  Your colonoscopy is due and is very important to follow up with [facility] to schedule this.  Please call [phone] to schedule.  Please let me know if any issues in doing so

## 2022-06-25 NOTE — Progress Notes (Unsigned)
Subjective:    Patient ID: [name], female    DOB: 19-Jan-1966, 56 y.o.   MRN: [id number]  CC: [name] is a 56 y.o. female who presents today for follow up.   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    B12 deficiency-patient has resumed B12 injections at home.  Depression with anxiety-increased Effexor 75 mg, compliant with trazodone 100mg  Colonoscopy is due HISTORY:  Past Medical History:  Diagnosis Date  . Anemia   . Anxiety and depression   . Chronic back pain   . DM2 (diabetes mellitus, type 2) ([place])    improved since gastric bypass  . Headache    stress/sinus - 2-3x/wk  . History of left shoulder fracture   . HLD (hyperlipidemia)   . HTN (hypertension)    improved since gastric bypass  . Hypothyroid   . Left leg weakness    s/p back surgery  . Migraines   . Obesity   . Pernicious anemia   . S/P insertion of spinal cord stimulator   . Seizure [facility])    told she had a seizure as young child, not sure of context; mother states she didnt witness  . Wears contact lenses    Past Surgical History:  Procedure Laterality Date  . Abd [place] - gallstones  5/03  . BACK SURGERY  2009   decompressive laminectomy  . BARIATRIC SURGERY  03/02/2012   gastric bypass; Dr Darnell Level  . CHOLECYSTECTOMY  6/03  . COLONOSCOPY WITH PROPOFOL N/A 05/01/2017   Procedure: COLONOSCOPY WITH PROPOFOL;  Surgeon: Lucilla Lame, MD;  Location: [place];  Service: Gastroenterology;  Laterality: N/A;  . DILATION AND CURETTAGE OF UTERUS  1/05  . ESOPHAGOGASTRODUODENOSCOPY (EGD) WITH PROPOFOL N/A 05/01/2017   Procedure: ESOPHAGOGASTRODUODENOSCOPY (EGD) WITH PROPOFOL;  Surgeon: Lucilla Lame, MD;  Location: [place];  Service: Gastroenterology;  Laterality: N/A;  . KNEE SURGERY     L - arthroscopic  . LIVER BIOPSY  6/03   fatty liver  . LUMBAR SPINE SURGERY  2015   Dr. Mick Sell  . NASAL SINUS SURGERY    . PANNICULECTOMY  08/2013   [facility], Dr. Chrisandra Carota  .  SHOULDER ARTHROSCOPY Left 10/06/2019  . sleep study - apnea  04/2001   improved since gastric bypass   Family History  Problem Relation Age of Onset  . Lung cancer Father   . Diabetes Mother   . Heart failure Mother   . Diabetes Sister   . Fibromyalgia Sister   . Colon cancer Maternal Aunt   . Skin cancer Paternal Aunt   . Skin cancer Paternal Uncle     Allergies: Latex and Penicillins Current Outpatient Medications on File Prior to Visit  Medication Sig Dispense Refill  . cyanocobalamin (VITAMIN B12) 1000 MCG/ML injection 1000 mcg (1 mL) intramuscular injection in the thigh ( vastus lateralis) once per month. 4 mL 3  . Estradiol-Norethindrone Acet 0.5-0.1 MG tablet Take 1 tablet by mouth daily. 84 tablet 3  . fentaNYL (DURAGESIC) 25 MCG/HR 1 patch every 3 (three) days. Patient is taking 12mg  instead of 25 mg    . gabapentin (NEURONTIN) 100 MG capsule Take 100 mg by mouth at bedtime.    Marland Kitchen NEEDLE, DISP, 25 G (EASY TOUCH FLIPLOCK NEEDLES) 25G X 1" MISC Used to give b12 injections monthly 50 each 0  . ondansetron (ZOFRAN-ODT) 4 MG disintegrating tablet Take 1 tablet (4 mg total) by mouth every 8 (eight) hours as needed for nausea  or vomiting. 12 tablet 0  . Oxycodone HCl 10 MG TABS SMARTSIG:1 Tablet(s) By Mouth Every 8-12 Hours PRN    . promethazine (PHENERGAN) 12.5 MG tablet TAKE 1 TABLET (12.5 MG TOTAL) BY MOUTH EVERY 8 (EIGHT) HOURS AS NEEDED FOR NAUSEA OR VOMITING. 20 tablet 0  . rizatriptan (MAXALT-MLT) 10 MG disintegrating tablet Take 1 tablet earliest onset of migraine.  May repeat in 2 hours if needed.  Maximum 2 tablets in 24 hours. 10 tablet 5  . Syringe/Needle, Disp, (SYRINGE 3CC/25GX1") 25G X 1" 3 ML MISC Use to give b12 injections monthly 50 each 0  . topiramate (TOPAMAX) 100 MG tablet Take 1 tablet (100 mg total) by mouth daily. 30 tablet 5  . traZODone (DESYREL) 100 MG tablet TAKE 1 TABLET BY MOUTH EVERYDAY AT BEDTIME 90 tablet 1  . venlafaxine XR (EFFEXOR-XR) 75 MG 24 hr  capsule Take 1 capsule (75 mg total) by mouth daily with breakfast. 90 capsule 1   No current facility-administered medications on file prior to visit.    Social History   Tobacco Use  . Smoking status: Never  . Smokeless tobacco: Never  . Tobacco comments:    tobacco use - no  Vaping Use  . Vaping Use: Never used  Substance Use Topics  . Alcohol use: No  . Drug use: No    Review of Systems    Objective:    LMP 11/28/2019 (Approximate)  BP Readings from Last 3 Encounters:  05/28/22 116/71  05/14/22 118/78  01/10/22 121/68   Wt Readings from Last 3 Encounters:  05/28/22 199 lb 9.6 oz (90.5 kg)  05/14/22 189 lb (85.7 kg)  01/10/22 194 lb 12.8 oz (88.4 kg)    Physical Exam     Assessment & Plan:   Problem List Items Addressed This Visit   None    I am having [name] maintain her fentaNYL, rizatriptan, promethazine, ondansetron, traZODone, topiramate, Oxycodone HCl, gabapentin, venlafaxine XR, cyanocobalamin, Easy Touch FlipLock Needles, SYRINGE 3CC/25GX1", and Estradiol-Norethindrone Acet.   No orders of the defined types were placed in this encounter.   Return precautions given.   Risks, benefits, and alternatives of the medications and treatment plan prescribed today were discussed, and patient expressed understanding.   Education regarding symptom management and diagnosis given to patient on AVS.  Continue to follow with Cheryl Grana, [name] for routine health maintenance.   [name] and I agreed with plan.   Cheryl Plowman, [name]

## 2022-07-01 ENCOUNTER — Encounter: Payer: Self-pay | Admitting: Family

## 2022-07-01 NOTE — Assessment & Plan Note (Signed)
Suboptimal control of anxiety.  Exacerbated by stress at work, caregiver.  We agreed to start BuSpar 5 mg twice daily.  Continue Effexor 75 mg, trazodone 100 mg.  She will let me know how she is doing

## 2022-07-01 NOTE — Assessment & Plan Note (Signed)
Ferritin 36 ( prior 83)  B12 192 ( prior 244)  Hemoglobin 11.9  Chronic, uncontrolled.  Patient has a history of normocytic anemia, gastric bypass.  Advised her to continue B12 injections monthly here in our office.  Advised her to follow with hematology as she may require iron infusion as iron stores have dropped significantly from prior.  She verbalized understanding and plans to call hematology.  Of note, further emphasized the importance of colonoscopy being up-to-date in the setting of anemia.  She verbalized understanding

## 2022-07-04 ENCOUNTER — Other Ambulatory Visit: Payer: Self-pay | Admitting: Family

## 2022-07-04 DIAGNOSIS — G4700 Insomnia, unspecified: Secondary | ICD-10-CM

## 2022-07-04 DIAGNOSIS — F418 Other specified anxiety disorders: Secondary | ICD-10-CM

## 2022-08-04 ENCOUNTER — Other Ambulatory Visit: Payer: Self-pay | Admitting: Neurology

## 2022-08-17 ENCOUNTER — Other Ambulatory Visit: Payer: Self-pay | Admitting: Family

## 2022-08-17 DIAGNOSIS — F418 Other specified anxiety disorders: Secondary | ICD-10-CM

## 2022-09-26 ENCOUNTER — Ambulatory Visit: Payer: PRIVATE HEALTH INSURANCE | Admitting: Family

## 2022-10-13 ENCOUNTER — Ambulatory Visit: Payer: No Typology Code available for payment source | Admitting: Neurology

## 2022-10-30 ENCOUNTER — Other Ambulatory Visit: Payer: Self-pay | Admitting: Neurology

## 2022-11-08 ENCOUNTER — Other Ambulatory Visit: Payer: Self-pay | Admitting: Family

## 2022-11-08 DIAGNOSIS — R519 Headache, unspecified: Secondary | ICD-10-CM

## 2022-11-28 ENCOUNTER — Other Ambulatory Visit: Payer: Self-pay | Admitting: Neurology

## 2023-02-16 ENCOUNTER — Other Ambulatory Visit: Payer: Self-pay | Admitting: Family

## 2023-02-16 DIAGNOSIS — G4700 Insomnia, unspecified: Secondary | ICD-10-CM

## 2023-02-16 DIAGNOSIS — F418 Other specified anxiety disorders: Secondary | ICD-10-CM

## 2023-03-11 NOTE — Progress Notes (Deleted)
NEUROLOGY FOLLOW UP OFFICE NOTE  [name] [id number]  Assessment/Plan:   Migraine without aura, without status migrainosus, not intractable   Migraine prevention:  start taking topiramate 50mg  every night. Migraine rescue:  rizatriptan 10mg .  Limit use of pain relievers to no more than 2 days out of week to prevent risk of rebound or medication-overuse headache. Keep headache diary Follow up 9 months.  Subjective:  [name] is a 57 year old right-handed Caucasian female with type 2 diabetes, chronic back pain, depression and anxiety and history of gastric bypass surgery who follows up for migraine.   UPDATE: Taking topiramate, increased to 100mg .   Intensity:  usually moderate, sometimes severe Duration:  1 to 2 hours with rizatriptan *** Frequency:  4 days a month *** However, she continues to have daily dull headache.   Current NSAIDS:  none Current analgesics:  Fentanyl patch (chronic back pain) Current triptans:  rizatriptan 10mg  Current ergotamine:  none Current anti-emetic:  promethazine 12.5mg  Current muscle relaxants: none Current anti-anxiolytic:  none Current sleep aide:  trazodone Current Antihypertensive medications:  none Current Antidepressant medications:  venlafaxine XR 37.5mg  daily, trazodone Current Anticonvulsant medications:  Topiramate 100mg  at bedtime Current anti-CGRP:none Current Vitamins/Herbal/Supplements: none Current Antihistamines/Decongestants:  none Other therapy:  none Hormone/birth control:  none   Caffeine:  Rarely drinks coffee.  No caffeinated soda. Diet:  Drinks a lot of water.  Drinks protein drink for breakfast and lunch.  Sometimes has decreased appetite since gastric bypass in 2013. Exercise:  Not routine (limited due to back pain) Depression: yes; Anxiety:  no Other pain:  Chronic back pain.  Sometimes neck pain. Sleep:  Good (due to medications)   HISTORY:  Onset:   After a fall in April 2020, hitting  her head.  Location:  Often starts in the temples and may move across forehead or back of head Quality:  Starts as pressure and progresses to a sharp pain Initial Intensity:  Usually moderate, sometimes severe Aura:  none Premonitory Phase:  none Postdrome:  feels like she is in a fog Associated symptoms:  Occasional nausea, photophobia, phonophobia.  She denies associated visual disturbance, unilateral numbness or weakness. Initial Duration:  30 to 45 minutes Initial Frequency:  Daily, sometimes more than once a day Initial Frequency of abortive medication: 5 out of 7 days a week.  Excedrin at least twice a week, otherwise may take Tylenol Triggers:  no Relieving factors:  Excedrin Activity:  Aggravates   Previous history of different headaches, migraines that would put her in bed, with nausea, photophobia, phonophobia     Past NSAIDS:  naproxen, Excedrin, Tylenol, Percocet (back pain) Past analgesics:  none Past abortive triptans:  none Past abortive ergotamine:  none Past muscle relaxants:  none Past anti-emetic:  Zofran ODT 4mg  Past antihypertensive medications:  propranolol Past antidepressant medications:  Cymbalta; citalopram, fluoxetine, Wellbutrin Past anticonvulsant medications:  Lyrica, gabapentin Past anti-CGRP:  Aimovig 70mg  (effective), Qulipta 60mg  Past vitamins/Herbal/Supplements:  magnesium citrate Past antihistamines/decongestants:  meclizine Other past therapies:  none     Family history of headache:  No   Imaging (personally reviewed): 01/02/2019 CT HEAD WO:  Stable right frontal encephalomalacia but no acute intracranial abnormality. 09/21/2018 CTA HEAD & NECK:  Negative  PAST MEDICAL HISTORY: Past Medical History:  Diagnosis Date   Anemia    Anxiety and depression    Chronic back pain    DM2 (diabetes mellitus, type 2) (HCC)    improved since gastric bypass  Headache    stress/sinus - 2-3x/wk   History of left shoulder fracture    HLD  (hyperlipidemia)    HTN (hypertension)    improved since gastric bypass   Hypothyroid    Left leg weakness    s/p back surgery   Migraines    Obesity    Pernicious anemia    S/P insertion of spinal cord stimulator    Seizure (HCC)    told she had a seizure as young child, not sure of context; mother states she didnt witness   Wears contact lenses     MEDICATIONS: Current Outpatient Medications on File Prior to Visit  Medication Sig Dispense Refill   busPIRone (BUSPAR) 5 MG tablet TAKE 1 TABLET BY MOUTH TWICE A DAY 180 tablet 2   cyanocobalamin (VITAMIN B12) 1000 MCG/ML injection 1000 mcg (1 mL) intramuscular injection in the thigh ( vastus lateralis) once per month. 4 mL 3   Estradiol-Norethindrone Acet 0.5-0.1 MG tablet Take 1 tablet by mouth daily. 84 tablet 3   fentaNYL (DURAGESIC) 25 MCG/HR 1 patch every 3 (three) days. Patient is taking 12mg  instead of 25 mg     gabapentin (NEURONTIN) 100 MG capsule Take 100 mg by mouth at bedtime.     NEEDLE, DISP, 25 G (EASY TOUCH FLIPLOCK NEEDLES) 25G X 1" MISC Used to give b12 injections monthly 50 each 0   ondansetron (ZOFRAN-ODT) 4 MG disintegrating tablet Take 1 tablet (4 mg total) by mouth every 8 (eight) hours as needed for nausea or vomiting. 12 tablet 0   Oxycodone HCl 10 MG TABS SMARTSIG:1 Tablet(s) By Mouth Every 8-12 Hours PRN     promethazine (PHENERGAN) 12.5 MG tablet TAKE 1 TABLET (12.5 MG TOTAL) BY MOUTH EVERY 8 (EIGHT) HOURS AS NEEDED FOR NAUSEA OR VOMITING. 20 tablet 0   rizatriptan (MAXALT-MLT) 10 MG disintegrating tablet Take 1 tablet earliest onset of migraine.  May repeat in 2 hours if needed.  Maximum 2 tablets in 24 hours. 10 tablet 5   Syringe/Needle, Disp, (SYRINGE 3CC/25GX1") 25G X 1" 3 ML MISC Use to give b12 injections monthly 50 each 0   topiramate (TOPAMAX) 100 MG tablet TAKE 1 TABLET BY MOUTH EVERY DAY 90 tablet 0   traZODone (DESYREL) 100 MG tablet TAKE 1 TABLET BY MOUTH EVERYDAY AT BEDTIME 90 tablet 1    venlafaxine XR (EFFEXOR-XR) 75 MG 24 hr capsule TAKE 1 CAPSULE BY MOUTH DAILY WITH BREAKFAST. 90 capsule 1   No current facility-administered medications on file prior to visit.    ALLERGIES: Allergies  Allergen Reactions   Latex Rash   Penicillins Itching and Swelling    Has patient had a PCN reaction causing immediate rash, facial/tongue/throat swelling, SOB or lightheadedness with hypotension: yes Has patient had a PCN reaction causing severe rash involving mucus membranes or skin necrosis: no Has patient had a PCN reaction that required hospitalization : yes ed visit Has patient had a PCN reaction occurring within the last 10 years: yes If all of the above answers are "NO", then may proceed with Cephalosporin use.     FAMILY HISTORY: Family History  Problem Relation Age of Onset   Lung cancer Father    Diabetes Mother    Heart failure Mother    Diabetes Sister    Fibromyalgia Sister    Colon cancer Maternal Aunt    Skin cancer Paternal Aunt    Skin cancer Paternal Uncle       Objective:  *** General: No acute  distress.  Patient appears well-groomed.   Head:  Normocephalic/atraumatic Eyes:  Fundi examined but not visualized Heart:  Regular rate and rhythm Neurological Exam: alert and oriented.  Speech fluent and not dysarthric, language intact.  CN II-XII intact. Bulk and tone normal, muscle strength 5/5 throughout.  Sensation to light touch intact.  Deep tendon reflexes 2+ throughout.  Finger to nose testing intact.  Gait normal, Romberg negative.   Shon Millet, DO  CC: Rennie Plowman, FNP

## 2023-03-13 ENCOUNTER — Ambulatory Visit: Payer: PRIVATE HEALTH INSURANCE | Admitting: Neurology

## 2023-05-08 ENCOUNTER — Other Ambulatory Visit: Payer: Self-pay | Admitting: Family

## 2023-05-08 DIAGNOSIS — F418 Other specified anxiety disorders: Secondary | ICD-10-CM

## 2023-05-20 ENCOUNTER — Other Ambulatory Visit: Payer: Self-pay | Admitting: Family

## 2023-05-20 DIAGNOSIS — R519 Headache, unspecified: Secondary | ICD-10-CM

## 2023-05-23 ENCOUNTER — Other Ambulatory Visit: Payer: Self-pay | Admitting: Family

## 2023-05-23 DIAGNOSIS — E538 Deficiency of other specified B group vitamins: Secondary | ICD-10-CM

## 2023-05-29 ENCOUNTER — Other Ambulatory Visit: Payer: Self-pay | Admitting: Certified Nurse Midwife

## 2023-06-01 ENCOUNTER — Other Ambulatory Visit: Payer: Self-pay

## 2023-06-01 DIAGNOSIS — Z3041 Encounter for surveillance of contraceptive pills: Secondary | ICD-10-CM

## 2023-06-01 DIAGNOSIS — Z01419 Encounter for gynecological examination (general) (routine) without abnormal findings: Secondary | ICD-10-CM

## 2023-06-01 MED ORDER — ESTRADIOL-NORETHINDRONE ACET 0.5-0.1 MG PO TABS
1.0000 | ORAL_TABLET | Freq: Every day | ORAL | 0 refills | Status: DC
Start: 2023-06-01 — End: 2023-09-21

## 2023-06-17 ENCOUNTER — Other Ambulatory Visit: Payer: Self-pay | Admitting: Family

## 2023-06-17 DIAGNOSIS — F418 Other specified anxiety disorders: Secondary | ICD-10-CM

## 2023-06-17 DIAGNOSIS — G4700 Insomnia, unspecified: Secondary | ICD-10-CM

## 2023-06-22 ENCOUNTER — Encounter: Payer: Self-pay | Admitting: Family

## 2023-06-22 ENCOUNTER — Ambulatory Visit (INDEPENDENT_AMBULATORY_CARE_PROVIDER_SITE_OTHER): Payer: Self-pay | Admitting: Family

## 2023-06-22 ENCOUNTER — Telehealth: Payer: Self-pay | Admitting: Family

## 2023-06-22 ENCOUNTER — Other Ambulatory Visit: Payer: Self-pay

## 2023-06-22 ENCOUNTER — Encounter: Payer: Self-pay | Admitting: Oncology

## 2023-06-22 VITALS — BP 130/76 | HR 62 | Temp 98.0°F | Ht 65.0 in | Wt 200.2 lb

## 2023-06-22 DIAGNOSIS — F418 Other specified anxiety disorders: Secondary | ICD-10-CM

## 2023-06-22 DIAGNOSIS — Z01419 Encounter for gynecological examination (general) (routine) without abnormal findings: Secondary | ICD-10-CM

## 2023-06-22 DIAGNOSIS — Z13220 Encounter for screening for lipoid disorders: Secondary | ICD-10-CM

## 2023-06-22 DIAGNOSIS — E039 Hypothyroidism, unspecified: Secondary | ICD-10-CM

## 2023-06-22 DIAGNOSIS — J3489 Other specified disorders of nose and nasal sinuses: Secondary | ICD-10-CM

## 2023-06-22 DIAGNOSIS — D509 Iron deficiency anemia, unspecified: Secondary | ICD-10-CM

## 2023-06-22 DIAGNOSIS — Z3041 Encounter for surveillance of contraceptive pills: Secondary | ICD-10-CM

## 2023-06-22 DIAGNOSIS — Z23 Encounter for immunization: Secondary | ICD-10-CM

## 2023-06-22 DIAGNOSIS — Z136 Encounter for screening for cardiovascular disorders: Secondary | ICD-10-CM

## 2023-06-22 LAB — CBC WITH DIFFERENTIAL/PLATELET
Basophils Absolute: 0 10*3/uL (ref 0.0–0.1)
Basophils Relative: 0.6 % (ref 0.0–3.0)
Eosinophils Absolute: 0.2 10*3/uL (ref 0.0–0.7)
Eosinophils Relative: 5.6 % — ABNORMAL HIGH (ref 0.0–5.0)
HCT: 35.3 % — ABNORMAL LOW (ref 36.0–46.0)
Hemoglobin: 11.3 g/dL — ABNORMAL LOW (ref 12.0–15.0)
Lymphocytes Relative: 26.1 % (ref 12.0–46.0)
Lymphs Abs: 1.1 10*3/uL (ref 0.7–4.0)
MCHC: 31.9 g/dL (ref 30.0–36.0)
MCV: 93.6 fL (ref 78.0–100.0)
Monocytes Absolute: 0.2 10*3/uL (ref 0.1–1.0)
Monocytes Relative: 6 % (ref 3.0–12.0)
Neutro Abs: 2.5 10*3/uL (ref 1.4–7.7)
Neutrophils Relative %: 61.7 % (ref 43.0–77.0)
Platelets: 169 10*3/uL (ref 150.0–400.0)
RBC: 3.77 Mil/uL — ABNORMAL LOW (ref 3.87–5.11)
RDW: 14.5 % (ref 11.5–15.5)
WBC: 4.1 10*3/uL (ref 4.0–10.5)

## 2023-06-22 LAB — COMPREHENSIVE METABOLIC PANEL
ALT: 32 U/L (ref 0–35)
AST: 39 U/L — ABNORMAL HIGH (ref 0–37)
Albumin: 3.3 g/dL — ABNORMAL LOW (ref 3.5–5.2)
Alkaline Phosphatase: 75 U/L (ref 39–117)
BUN: 13 mg/dL (ref 6–23)
CO2: 31 meq/L (ref 19–32)
Calcium: 8.6 mg/dL (ref 8.4–10.5)
Chloride: 106 meq/L (ref 96–112)
Creatinine, Ser: 0.77 mg/dL (ref 0.40–1.20)
GFR: 85.82 mL/min (ref >60.00)
Glucose, Bld: 81 mg/dL (ref 70–99)
Potassium: 4.4 meq/L (ref 3.5–5.1)
Sodium: 142 meq/L (ref 135–145)
Total Bilirubin: 0.3 mg/dL (ref 0.2–1.2)
Total Protein: 6 g/dL (ref 6.0–8.3)

## 2023-06-22 LAB — LIPID PANEL
Cholesterol: 93 mg/dL (ref 0–200)
HDL: 43.4 mg/dL (ref >39.00)
LDL Cholesterol: 42 mg/dL (ref 0–99)
NonHDL: 49.59
Total CHOL/HDL Ratio: 2
Triglycerides: 37 mg/dL (ref 0.0–149.0)
VLDL: 7.4 mg/dL (ref 0.0–40.0)

## 2023-06-22 LAB — HEMOGLOBIN A1C: Hgb A1c MFr Bld: 5.7 % (ref 4.6–6.5)

## 2023-06-22 LAB — TSH: TSH: 2.23 u[IU]/mL (ref 0.35–5.50)

## 2023-06-22 LAB — VITAMIN D 25 HYDROXY (VIT D DEFICIENCY, FRACTURES): VITD: 20.43 ng/mL — ABNORMAL LOW (ref 30.00–100.00)

## 2023-06-22 MED ORDER — MUPIROCIN 2 % EX OINT
1.0000 | TOPICAL_OINTMENT | Freq: Two times a day (BID) | CUTANEOUS | 0 refills | Status: DC
Start: 2023-06-22 — End: 2023-06-22

## 2023-06-22 MED ORDER — BUSPIRONE HCL 5 MG PO TABS: 5.0000 mg | ORAL_TABLET | Freq: Three times a day (TID) | ORAL | 2 refills | Status: DC

## 2023-06-22 MED ORDER — MUPIROCIN 2 % EX OINT
1.0000 | TOPICAL_OINTMENT | Freq: Two times a day (BID) | CUTANEOUS | 0 refills | Status: DC
  Filled 2023-06-22: qty 22, 11d supply, fill #0

## 2023-06-22 NOTE — Assessment & Plan Note (Addendum)
Suboptimal control .  Increase BuSpar to 5 mg 3 times daily.  Continue trazodone 100 mg nightly, Effexor 75 mg

## 2023-06-22 NOTE — Assessment & Plan Note (Signed)
No systemic features.  Nasal sores on exam.  Start mupirocin.  Advised cool-mist humidifier.  Advised to stop Mucinex max with phenylephrine as may be aggravating.  Advised just to use Mucinex DM for congestion and cough.  She will let me know how she is doing

## 2023-06-22 NOTE — Telephone Encounter (Signed)
Pt called stating the pharmacy told her it would take several weeks to get the mupirocin ointment in so pt need an alternative

## 2023-06-22 NOTE — Progress Notes (Signed)
Assessment & Plan:  Nasal lesion Assessment & Plan: No systemic features.  Nasal sores on exam.  Start mupirocin.  Advised cool-mist humidifier.  Advised to stop Mucinex max with phenylephrine as may be aggravating.  Advised just to use Mucinex DM for congestion and cough.  She will let me know how she is doing  Orders: -     Mupirocin; Apply 1 Application topically 2 (two) times daily.  Dispense: 22 g; Refill: 0  Hypothyroidism, unspecified type -     Hemoglobin A1c -     Comprehensive metabolic panel -     TSH  Iron deficiency anemia, unspecified iron deficiency anemia type -     CBC with Differential/Platelet  Depression with anxiety Assessment & Plan: Suboptimal control .  Increase BuSpar to 5 mg 3 times daily.  Continue trazodone 100 mg nightly, Effexor 75 mg  Orders: -     VITAMIN D 25 Hydroxy (Vit-D Deficiency, Fractures) -     busPIRone HCl; Take 1 tablet (5 mg total) by mouth 3 (three) times daily.  Dispense: 270 tablet; Refill: 2  Encounter for lipid screening for cardiovascular disease -     Lipid panel  Need for influenza vaccination -     Flu vaccine trivalent PF, 6mos and older(Flulaval,Afluria,Fluarix,Fluzone)     Return precautions given.   Risks, benefits, and alternatives of the medications and treatment plan prescribed today were discussed, and patient expressed understanding.   Education regarding symptom management and diagnosis given to patient on AVS either electronically or printed.  Return in about 1 year (around 06/21/2024).  Rennie Plowman, FNP  Subjective:    Patient ID: [name], female    DOB: [birth year], 57 y.o.   MRN: [id number]  CC: Ivette Castronova is a 57 y.o. female who presents today for follow up.   HPI: Complains of 'sores' in bilateral sides of nose x 5-7 days. Red spots bilateral nose.  Endorses green to white nasal congestion, productive cough   No fever,ear pain, sore throat  She is not using nasal  sprays. She is taking mucinex max with improvement of cough.   She is taking care of her mom with end stage lung cancer. Increased anxiety as mom is living with her and is foregoing chemotherapy.   Taking buspar 5 mg BID  Headaches have been well controlled.   Allergies: Latex and Penicillins Current Outpatient Medications on File Prior to Visit  Medication Sig Dispense Refill   cyanocobalamin (VITAMIN B12) 1000 MCG/ML injection 1000 MCG (1 ML) INTRAMUSCULAR INJECTION IN THE THIGH ( VASTUS LATERALIS) ONCE PER MONTH. 3 mL 5   Estradiol-Norethindrone Acet 0.5-0.1 MG tablet Take 1 tablet by mouth daily. 84 tablet 0   fentaNYL (DURAGESIC) 25 MCG/HR 1 patch every 3 (three) days. Patient is taking 12mg  instead of 25 mg     gabapentin (NEURONTIN) 100 MG capsule Take 600 mg by mouth 3 (three) times daily.     NEEDLE, DISP, 25 G (EASY TOUCH FLIPLOCK NEEDLES) 25G X 1" MISC Used to give b12 injections monthly 50 each 0   ondansetron (ZOFRAN-ODT) 4 MG disintegrating tablet Take 1 tablet (4 mg total) by mouth every 8 (eight) hours as needed for nausea or vomiting. 12 tablet 0   Oxycodone HCl 10 MG TABS SMARTSIG:1 Tablet(s) By Mouth Every 8-12 Hours PRN     promethazine (PHENERGAN) 12.5 MG tablet TAKE 1 TABLET (12.5 MG TOTAL) BY MOUTH EVERY 8 (EIGHT) HOURS AS NEEDED FOR NAUSEA OR VOMITING.  20 tablet 0   rizatriptan (MAXALT-MLT) 10 MG disintegrating tablet Take 1 tablet earliest onset of migraine.  May repeat in 2 hours if needed.  Maximum 2 tablets in 24 hours. 10 tablet 5   Syringe/Needle, Disp, (SYRINGE 3CC/25GX1") 25G X 1" 3 ML MISC Use to give b12 injections monthly 50 each 0   traZODone (DESYREL) 100 MG tablet TAKE 1 TABLET BY MOUTH EVERYDAY AT BEDTIME 90 tablet 3   venlafaxine XR (EFFEXOR-XR) 75 MG 24 hr capsule TAKE 1 CAPSULE BY MOUTH DAILY WITH BREAKFAST. 90 capsule 1   No current facility-administered medications on file prior to visit.    Review of Systems  Constitutional:  Negative for  chills and fever.  HENT:  Positive for congestion. Negative for nosebleeds, postnasal drip and sore throat.   Respiratory:  Positive for cough. Negative for shortness of breath and wheezing.   Cardiovascular:  Negative for chest pain and palpitations.  Gastrointestinal:  Negative for nausea and vomiting.  Psychiatric/Behavioral:  Negative for suicidal ideas. The patient is nervous/anxious.       Objective:    BP 130/76   Pulse 62   Temp 98 F (36.7 C) (Oral)   Ht 5\' 5"  (1.651 m)   Wt 200 lb 3.2 oz (90.8 kg)   LMP 11/28/2019 (Approximate)   SpO2 99%   BMI 33.32 kg/m  BP Readings from Last 3 Encounters:  06/22/23 130/76  06/25/22 118/78  05/28/22 116/71   Wt Readings from Last 3 Encounters:  06/22/23 200 lb 3.2 oz (90.8 kg)  06/25/22 189 lb 14.4 oz (86.1 kg)  05/28/22 199 lb 9.6 oz (90.5 kg)    Physical Exam Vitals reviewed.  Constitutional:      Appearance: She is well-developed.  HENT:     Nose:      Comments: Dryness and open macules present bilateral nose. No purulent discharge, bleeding. Eyes:     Conjunctiva/sclera: Conjunctivae normal.  Cardiovascular:     Rate and Rhythm: Normal rate and regular rhythm.     Pulses: Normal pulses.     Heart sounds: Normal heart sounds.  Pulmonary:     Effort: Pulmonary effort is normal.     Breath sounds: Normal breath sounds. No wheezing, rhonchi or rales.  Skin:    General: Skin is warm and dry.  Neurological:     Mental Status: She is alert.  Psychiatric:        Speech: Speech normal.        Behavior: Behavior normal.        Thought Content: Thought content normal.

## 2023-06-22 NOTE — Patient Instructions (Addendum)
I am thinking about you with your mom.  I would definitely recommend increasing BuSpar to 3 times daily.  I sent in a new prescription for you.    Stop mucinex MAX which has sudafed (Pseudoephedrine) which I suspect is drying and potentially making nasal lesions worse  I would instead use plain mucinex DM.   Trial of mupirocin for nose lesions; if does not completely resolve, please let me know  I would also recommend a coolmist humidifier at the bedside to keep inside of your nose moist.

## 2023-06-22 NOTE — Telephone Encounter (Signed)
[facility] pharmacy they do have it in stock sent rx there pt has been notified

## 2023-06-22 NOTE — Addendum Note (Signed)
Addended by: [place], Nanako Stopher on: 06/22/2023 02:48 PM   Modules accepted: Orders

## 2023-06-22 NOTE — Addendum Note (Signed)
Addended by: [place], Jenaye Rickert on: 06/22/2023 02:53 PM   Modules accepted: Orders

## 2023-06-23 ENCOUNTER — Other Ambulatory Visit: Payer: Self-pay | Admitting: Family

## 2023-06-23 DIAGNOSIS — Z8639 Personal history of other endocrine, nutritional and metabolic disease: Secondary | ICD-10-CM

## 2023-06-23 DIAGNOSIS — D649 Anemia, unspecified: Secondary | ICD-10-CM

## 2023-06-23 MED ORDER — CHOLECALCIFEROL 1.25 MG (50000 UT) PO TABS
ORAL_TABLET | ORAL | 0 refills | Status: DC
Start: 2023-06-23 — End: 2023-07-16

## 2023-06-30 ENCOUNTER — Telehealth: Payer: Self-pay

## 2023-06-30 NOTE — Telephone Encounter (Signed)
LVM to call back to go over results 

## 2023-07-02 ENCOUNTER — Other Ambulatory Visit: Payer: Self-pay

## 2023-07-07 ENCOUNTER — Other Ambulatory Visit: Payer: Self-pay | Admitting: Family

## 2023-07-07 ENCOUNTER — Encounter: Payer: Self-pay | Admitting: Family

## 2023-07-07 DIAGNOSIS — D509 Iron deficiency anemia, unspecified: Secondary | ICD-10-CM

## 2023-07-15 ENCOUNTER — Ambulatory Visit: Payer: PRIVATE HEALTH INSURANCE | Admitting: Certified Nurse Midwife

## 2023-07-15 ENCOUNTER — Other Ambulatory Visit: Payer: Self-pay | Admitting: Family

## 2023-07-15 DIAGNOSIS — Z8639 Personal history of other endocrine, nutritional and metabolic disease: Secondary | ICD-10-CM

## 2023-07-16 ENCOUNTER — Other Ambulatory Visit: Payer: Self-pay | Admitting: Family

## 2023-07-16 DIAGNOSIS — M5126 Other intervertebral disc displacement, lumbar region: Secondary | ICD-10-CM | POA: Diagnosis not present

## 2023-07-16 DIAGNOSIS — M533 Sacrococcygeal disorders, not elsewhere classified: Secondary | ICD-10-CM | POA: Diagnosis not present

## 2023-07-16 DIAGNOSIS — Z8639 Personal history of other endocrine, nutritional and metabolic disease: Secondary | ICD-10-CM

## 2023-07-16 DIAGNOSIS — Z79899 Other long term (current) drug therapy: Secondary | ICD-10-CM | POA: Diagnosis not present

## 2023-07-16 DIAGNOSIS — G8929 Other chronic pain: Secondary | ICD-10-CM | POA: Diagnosis not present

## 2023-07-16 DIAGNOSIS — Z5181 Encounter for therapeutic drug level monitoring: Secondary | ICD-10-CM | POA: Diagnosis not present

## 2023-07-16 MED ORDER — CHOLECALCIFEROL 1.25 MG (50000 UT) PO TABS: ORAL_TABLET | ORAL | 0 refills | Status: DC

## 2023-07-16 NOTE — Telephone Encounter (Signed)
LVM to call back to discuss note below regarding Vit D

## 2023-07-16 NOTE — Telephone Encounter (Signed)
LVM to inform pt rx was sent in to the pharmacy

## 2023-08-06 ENCOUNTER — Telehealth: Payer: Self-pay | Admitting: Family

## 2023-08-06 DIAGNOSIS — F418 Other specified anxiety disorders: Secondary | ICD-10-CM

## 2023-08-06 DIAGNOSIS — G4700 Insomnia, unspecified: Secondary | ICD-10-CM

## 2023-08-06 MED ORDER — TRAZODONE HCL 100 MG PO TABS: ORAL_TABLET | ORAL | 3 refills | Status: DC

## 2023-08-06 NOTE — Telephone Encounter (Signed)
Patient called office back and note was read.

## 2023-08-06 NOTE — Telephone Encounter (Signed)
LVM to inform pt that refill has been sent in to the pharmacy

## 2023-08-06 NOTE — Telephone Encounter (Signed)
Pt called stating her medication trazodone accidentally got thrown away so she would like to get another refill

## 2023-08-18 ENCOUNTER — Other Ambulatory Visit: Payer: Self-pay | Admitting: Family

## 2023-08-18 DIAGNOSIS — R519 Headache, unspecified: Secondary | ICD-10-CM

## 2023-08-19 ENCOUNTER — Other Ambulatory Visit (HOSPITAL_COMMUNITY): Payer: Self-pay

## 2023-09-01 DIAGNOSIS — H02882 Meibomian gland dysfunction right lower eyelid: Secondary | ICD-10-CM | POA: Diagnosis not present

## 2023-09-01 DIAGNOSIS — H02885 Meibomian gland dysfunction left lower eyelid: Secondary | ICD-10-CM | POA: Diagnosis not present

## 2023-09-01 DIAGNOSIS — H1045 Other chronic allergic conjunctivitis: Secondary | ICD-10-CM | POA: Diagnosis not present

## 2023-09-01 DIAGNOSIS — D3131 Benign neoplasm of right choroid: Secondary | ICD-10-CM | POA: Diagnosis not present

## 2023-09-07 ENCOUNTER — Other Ambulatory Visit: Payer: Self-pay | Admitting: Family

## 2023-09-07 DIAGNOSIS — Z8639 Personal history of other endocrine, nutritional and metabolic disease: Secondary | ICD-10-CM

## 2023-09-17 ENCOUNTER — Telehealth: Payer: Self-pay | Admitting: Gastroenterology

## 2023-09-17 DIAGNOSIS — G8929 Other chronic pain: Secondary | ICD-10-CM | POA: Diagnosis not present

## 2023-09-17 DIAGNOSIS — M51372 Other intervertebral disc degeneration, lumbosacral region with discogenic back pain and lower extremity pain: Secondary | ICD-10-CM | POA: Diagnosis not present

## 2023-09-17 DIAGNOSIS — M5126 Other intervertebral disc displacement, lumbar region: Secondary | ICD-10-CM | POA: Diagnosis not present

## 2023-09-17 DIAGNOSIS — M533 Sacrococcygeal disorders, not elsewhere classified: Secondary | ICD-10-CM | POA: Diagnosis not present

## 2023-09-17 NOTE — Telephone Encounter (Signed)
 The patient called in and left a voicemail requesting for [name] to call her back. She wants to know her appointment time. I called the patient back to let her know we got her message and no answer I left a voicemail for her to call [name] back.

## 2023-09-18 ENCOUNTER — Telehealth: Payer: Self-pay | Admitting: Gastroenterology

## 2023-09-18 ENCOUNTER — Other Ambulatory Visit: Payer: Self-pay | Admitting: Pain Medicine

## 2023-09-18 DIAGNOSIS — M5416 Radiculopathy, lumbar region: Secondary | ICD-10-CM

## 2023-09-18 NOTE — Telephone Encounter (Signed)
 The patient called in and left a voicemail requesting for [place] to call her back. To give her the time for appointment. I called the patient back and there was no answer I left a voicemail with the time and date.

## 2023-09-21 ENCOUNTER — Other Ambulatory Visit: Payer: Self-pay | Admitting: Certified Nurse Midwife

## 2023-09-21 ENCOUNTER — Telehealth: Payer: Self-pay

## 2023-09-21 DIAGNOSIS — Z3041 Encounter for surveillance of contraceptive pills: Secondary | ICD-10-CM

## 2023-09-21 DIAGNOSIS — Z01419 Encounter for gynecological examination (general) (routine) without abnormal findings: Secondary | ICD-10-CM

## 2023-09-21 MED ORDER — ESTRADIOL-NORETHINDRONE ACET 0.5-0.1 MG PO TABS: 1.0000 | ORAL_TABLET | Freq: Every day | ORAL | 1 refills | Status: DC

## 2023-09-21 NOTE — Telephone Encounter (Signed)
 Spoke w/patient. Advised 1 pack with 1 refill sent to get to 10/2023 annual

## 2023-09-21 NOTE — Telephone Encounter (Signed)
 TRIAGE VOICEMAIL: Patient requesting refill of Estradiol-Norethindrone Acet 0.5-0.1 MG tablet

## 2023-09-22 ENCOUNTER — Encounter: Payer: Self-pay | Admitting: Oncology

## 2023-09-25 ENCOUNTER — Ambulatory Visit: Admission: RE | Admit: 2023-09-25 | Payer: Self-pay | Source: Ambulatory Visit

## 2023-09-28 ENCOUNTER — Other Ambulatory Visit: Payer: Self-pay

## 2023-09-29 NOTE — Progress Notes (Signed)
 Brigitte Canard, PA-C [address]  [address]  [place], [place] [address]  Main: [phone]  Fax: [phone]   Primary Care Physician: Calista Catching, FNP  Primary Gastroenterologist:  Brigitte Canard, PA-C / Dr. Marnee Sink    CC: F/U Abd Pain, Diarrhea, Wt. Loss, Anemia  HPI: [name] is a 58 y.o. female returns for follow-up.  She last saw Dr. Ole Berkeley 09/23/2021 for chronic epigastric abdominal pain, diarrhea, and weight loss.  Previous history of iron  deficiency anemia and abnormal liver enzymes.  Found to have fatty liver disease.  Anemia and LFTs normalized.  History of gastric bypass surgery.  04/2017 EGD by Dr. Ole Berkeley (for iron  deficiency anemia): Previous gastric bypass with a staple found at the anastomosis which was removed.  1 nonbleeding 10 mm cratered ulcer with no bleeding distal to the gastrojejunal anastomosis.  Treated with PPI.  04/2017 colonoscopy: Poor prep.  No specimens.  Repeat in 5 years with extra prep.  She is overdue for repeat colonoscopy.  06/2023 labs: Hemoglobin 11.3, MCV 93, low vitamin D  20.4, A1c 5.7, slightly elevated AST 39.  All other LFTs normal.  GFR 85.  Normal TSH.  Current symptoms: She continues to have episodes of diarrhea alternating with constipation.  Has associated lower abdominal cramping.  She denies heartburn, dysphagia, melena, hematochezia.  She cannot tolerate oral iron .  She has received IV iron  infusions from Dr. Randy Buttery in the past.   Current Outpatient Medications  Medication Sig Dispense Refill   busPIRone  (BUSPAR ) 5 MG tablet Take 1 tablet (5 mg total) by mouth 3 (three) times daily. 270 tablet 2   Cholecalciferol  1.25 MG (50000 UT) TABS 50,000 units PO qwk for 4 weeks. 4 tablet 0   cyanocobalamin  (VITAMIN B12) 1000 MCG/ML injection 1000 MCG (1 ML) INTRAMUSCULAR INJECTION IN THE THIGH ( VASTUS LATERALIS) ONCE PER MONTH. 3 mL 5   cyclobenzaprine (FLEXERIL) 10 MG tablet Take 10 mg by mouth daily as needed.      Estradiol -Norethindrone  Acet 0.5-0.1 MG tablet TAKE 1 TABLET BY MOUTH EVERY DAY 84 tablet 0   fentaNYL (DURAGESIC) 25 MCG/HR 1 patch every 3 (three) days. Patient is taking 12mg  instead of 25 mg     gabapentin  (NEURONTIN ) 100 MG capsule Take 600 mg by mouth 3 (three) times daily.     methylPREDNISolone (MEDROL DOSEPAK) 4 MG TBPK tablet Take by mouth as directed.     mupirocin  ointment (BACTROBAN ) 2 % Apply 1 application topically to the affected area(s) 2 (two) times daily. 22 g 0   NEEDLE, DISP, 25 G (EASY TOUCH FLIPLOCK NEEDLES) 25G X 1" MISC Used to give b12 injections monthly 50 each 0   ondansetron  (ZOFRAN -ODT) 4 MG disintegrating tablet Take 1 tablet (4 mg total) by mouth every 8 (eight) hours as needed for nausea or vomiting. 12 tablet 0   Oxycodone  HCl 10 MG TABS SMARTSIG:1 Tablet(s) By Mouth Every 8-12 Hours PRN     polyethylene glycol-electrolytes (NULYTELY) 420 g solution Take 8,000 mLs by mouth once for 1 dose. Use as directed for your colonoscopy preparation 4000 mL 0   promethazine  (PHENERGAN ) 12.5 MG tablet TAKE 1 TABLET (12.5 MG TOTAL) BY MOUTH EVERY 8 (EIGHT) HOURS AS NEEDED FOR NAUSEA OR VOMITING. 20 tablet 0   rizatriptan  (MAXALT -MLT) 10 MG disintegrating tablet Take 1 tablet earliest onset of migraine.  May repeat in 2 hours if needed.  Maximum 2 tablets in 24 hours. 10 tablet 5   Syringe/Needle, Disp, (SYRINGE  3CC/25GX1") 25G X 1" 3 ML MISC Use to give b12 injections monthly 50 each 0   traZODone  (DESYREL ) 100 MG tablet Take 1 tablet by mouth everyday at bedtime 90 tablet 3   venlafaxine  XR (EFFEXOR -XR) 75 MG 24 hr capsule TAKE 1 CAPSULE BY MOUTH DAILY WITH BREAKFAST. 90 capsule 1   No current facility-administered medications for this visit.    Allergies as of 09/30/2023 - Review Complete 09/30/2023  Allergen Reaction Noted   Latex Rash 03/31/2018   Penicillins Itching and Swelling 12/28/2007    Past Medical History:  Diagnosis Date   Anemia    Anxiety and depression     Chronic back pain    DM2 (diabetes mellitus, type 2) (HCC)    improved since gastric bypass   Headache    stress/sinus - 2-3x/wk   History of left shoulder fracture    HLD (hyperlipidemia)    HTN (hypertension)    improved since gastric bypass   Hypothyroid    Left leg weakness    s/p back surgery   Migraines    Obesity    Pernicious anemia    S/P insertion of spinal cord stimulator    Seizure (HCC)    told she had a seizure as young child, not sure of context; mother states she didnt witness   Wears contact lenses     Past Surgical History:  Procedure Laterality Date   Abd US  - gallstones  5/03   BACK SURGERY  2009   decompressive laminectomy   BARIATRIC SURGERY  03/02/2012   gastric bypass; Dr Therese Flash   CHOLECYSTECTOMY  6/03   COLONOSCOPY WITH PROPOFOL  N/A 05/01/2017   Procedure: COLONOSCOPY WITH PROPOFOL ;  Surgeon: Marnee Sink, MD;  Location: [facility];  Service: Gastroenterology;  Laterality: N/A;   DILATION AND CURETTAGE OF UTERUS  1/05   ESOPHAGOGASTRODUODENOSCOPY (EGD) WITH PROPOFOL  N/A 05/01/2017   Procedure: ESOPHAGOGASTRODUODENOSCOPY (EGD) WITH PROPOFOL ;  Surgeon: Marnee Sink, MD;  Location: [facility];  Service: Gastroenterology;  Laterality: N/A;   KNEE SURGERY     L - arthroscopic   LIVER BIOPSY  6/03   fatty liver   LUMBAR SPINE SURGERY  2015   Dr. Jimmie Moulder   NASAL SINUS SURGERY     PANNICULECTOMY  08/2013   [facility], Dr. Ellan Gunner   SHOULDER ARTHROSCOPY Left 10/06/2019   sleep study - apnea  04/2001   improved since gastric bypass    Review of Systems:    All systems reviewed and negative except where noted in HPI.   Physical Examination:   BP 121/70   Pulse 73   Temp 97.6 F (36.4 C)   Ht 5\' 5"  (1.651 m)   Wt 198 lb 9.6 oz (90.1 kg)   LMP 11/28/2019 (Approximate)   BMI 33.05 kg/m   General: Well-nourished, well-developed in no acute distress.  Lungs: Clear to auscultation bilaterally. Non-labored. Heart: Regular rate and  rhythm, no murmurs rubs or gallops.  Abdomen: Bowel sounds are normal; Abdomen is Soft; No hepatosplenomegaly, masses or hernias;  No Abdominal Tenderness; No guarding or rebound tenderness. Neuro: Alert and oriented x 3.  Grossly intact.  Psych: Alert and cooperative, normal mood and affect.   Imaging Studies: No results found.  Assessment and Plan:   Montinique Czarny is a 58 y.o. y/o female returns for follow-up of:  1.  History of gastric bypass  2.  History of gastric ulcer  Scheduling EGD I discussed risks of EGD with patient to include risk  of bleeding, perforation, and risk of sedation.  Patient expressed understanding and agrees to proceed with EGD.   Avoid NSAIDs  3.  Chronic iron  deficiency anemia  Labs: CBC, iron  panel, ferritin  If iron  is low, then refer back to Dr. Jerome Moores for IV iron  infusion.  She cannot tolerate oral iron .  4.  Constipation and diarrhea  Start Benefiber powder, mix 1 or 2 tablespoons in a drink once daily.   5.  Colon cancer screening  Scheduling Colonoscopy I discussed risks of colonoscopy with patient to include risk of bleeding, colon perforation, and risk of sedation.  Patient expressed understanding and agrees to proceed with colonoscopy.   2-day prep    Brigitte Canard, PA-C  Follow up based on EGD, colonoscopy, and GI symptoms.

## 2023-09-30 ENCOUNTER — Other Ambulatory Visit: Payer: Self-pay | Admitting: Physician Assistant

## 2023-09-30 ENCOUNTER — Ambulatory Visit (INDEPENDENT_AMBULATORY_CARE_PROVIDER_SITE_OTHER): Payer: BC Managed Care – PPO | Admitting: Physician Assistant

## 2023-09-30 ENCOUNTER — Encounter: Payer: Self-pay | Admitting: Physician Assistant

## 2023-09-30 ENCOUNTER — Encounter: Payer: Self-pay | Admitting: Oncology

## 2023-09-30 VITALS — BP 121/70 | HR 73 | Temp 97.6°F | Ht 65.0 in | Wt 198.6 lb

## 2023-09-30 DIAGNOSIS — R197 Diarrhea, unspecified: Secondary | ICD-10-CM | POA: Diagnosis not present

## 2023-09-30 DIAGNOSIS — D509 Iron deficiency anemia, unspecified: Secondary | ICD-10-CM

## 2023-09-30 DIAGNOSIS — Z8711 Personal history of peptic ulcer disease: Secondary | ICD-10-CM

## 2023-09-30 DIAGNOSIS — Z9884 Bariatric surgery status: Secondary | ICD-10-CM

## 2023-09-30 DIAGNOSIS — K5900 Constipation, unspecified: Secondary | ICD-10-CM

## 2023-09-30 DIAGNOSIS — Z1211 Encounter for screening for malignant neoplasm of colon: Secondary | ICD-10-CM

## 2023-09-30 DIAGNOSIS — R198 Other specified symptoms and signs involving the digestive system and abdomen: Secondary | ICD-10-CM

## 2023-09-30 MED ORDER — PEG 3350-KCL-NA BICARB-NACL 420 G PO SOLR: 8000.0000 mL | Freq: Once | ORAL | 0 refills | Status: DC

## 2023-09-30 NOTE — Patient Instructions (Addendum)
   Start OTC Benefiber Powder. Mix 1 - 2 Tablespoons in 6 - 8 ounces of a Drink Once Daily. Drink 64 ounces of water / fluids Daily.

## 2023-10-01 ENCOUNTER — Encounter: Payer: Self-pay | Admitting: Oncology

## 2023-10-05 ENCOUNTER — Ambulatory Visit
Admission: RE | Admit: 2023-10-05 | Discharge: 2023-10-05 | Disposition: A | Discharge status: Home / Self Care | Payer: BC Managed Care – PPO | Source: Ambulatory Visit | Attending: Pain Medicine | Admitting: Pain Medicine

## 2023-10-05 DIAGNOSIS — M5117 Intervertebral disc disorders with radiculopathy, lumbosacral region: Secondary | ICD-10-CM | POA: Diagnosis not present

## 2023-10-05 DIAGNOSIS — M4807 Spinal stenosis, lumbosacral region: Secondary | ICD-10-CM | POA: Diagnosis not present

## 2023-10-05 DIAGNOSIS — M5416 Radiculopathy, lumbar region: Secondary | ICD-10-CM | POA: Insufficient documentation

## 2023-10-05 DIAGNOSIS — M5116 Intervertebral disc disorders with radiculopathy, lumbar region: Secondary | ICD-10-CM | POA: Diagnosis not present

## 2023-10-05 DIAGNOSIS — M9963 Osseous and subluxation stenosis of intervertebral foramina of lumbar region: Secondary | ICD-10-CM | POA: Diagnosis not present

## 2023-10-27 ENCOUNTER — Ambulatory Visit: Payer: BC Managed Care – PPO | Admitting: Certified Registered Nurse Anesthetist

## 2023-10-27 ENCOUNTER — Other Ambulatory Visit: Payer: Self-pay

## 2023-10-27 ENCOUNTER — Encounter
Admission: RE | Disposition: A | Discharge status: Home / Self Care | Payer: Self-pay | Source: Home / Self Care | Attending: Gastroenterology

## 2023-10-27 ENCOUNTER — Ambulatory Visit
Admission: RE | Admit: 2023-10-27 | Discharge: 2023-10-27 | Disposition: A | Discharge status: Home / Self Care | Payer: BC Managed Care – PPO | Attending: Gastroenterology | Admitting: Gastroenterology

## 2023-10-27 ENCOUNTER — Encounter: Payer: Self-pay | Admitting: Gastroenterology

## 2023-10-27 DIAGNOSIS — R569 Unspecified convulsions: Secondary | ICD-10-CM | POA: Diagnosis not present

## 2023-10-27 DIAGNOSIS — G43909 Migraine, unspecified, not intractable, without status migrainosus: Secondary | ICD-10-CM | POA: Insufficient documentation

## 2023-10-27 DIAGNOSIS — D509 Iron deficiency anemia, unspecified: Secondary | ICD-10-CM | POA: Insufficient documentation

## 2023-10-27 DIAGNOSIS — Z1211 Encounter for screening for malignant neoplasm of colon: Secondary | ICD-10-CM | POA: Diagnosis not present

## 2023-10-27 DIAGNOSIS — Z8673 Personal history of transient ischemic attack (TIA), and cerebral infarction without residual deficits: Secondary | ICD-10-CM | POA: Insufficient documentation

## 2023-10-27 DIAGNOSIS — I10 Essential (primary) hypertension: Secondary | ICD-10-CM | POA: Diagnosis not present

## 2023-10-27 DIAGNOSIS — Z9884 Bariatric surgery status: Secondary | ICD-10-CM | POA: Insufficient documentation

## 2023-10-27 DIAGNOSIS — E119 Type 2 diabetes mellitus without complications: Secondary | ICD-10-CM | POA: Insufficient documentation

## 2023-10-27 HISTORY — PX: ESOPHAGOGASTRODUODENOSCOPY (EGD) WITH PROPOFOL: SHX5813

## 2023-10-27 HISTORY — PX: COLONOSCOPY WITH PROPOFOL: SHX5780

## 2023-10-27 SURGERY — ESOPHAGOGASTRODUODENOSCOPY (EGD) WITH PROPOFOL
Anesthesia: General

## 2023-10-27 MED ORDER — SODIUM CHLORIDE 0.9 % IV SOLN: INTRAVENOUS | Status: DC

## 2023-10-27 MED ORDER — LIDOCAINE HCL (CARDIAC) PF 100 MG/5ML IV SOSY
PREFILLED_SYRINGE | INTRAVENOUS | Status: DC | PRN
  Administered 2023-10-27: 100 mg via INTRAVENOUS

## 2023-10-27 MED ORDER — PROPOFOL 500 MG/50ML IV EMUL
INTRAVENOUS | Status: DC | PRN
  Administered 2023-10-27: 150 ug/kg/min via INTRAVENOUS

## 2023-10-27 MED ORDER — PROPOFOL 10 MG/ML IV BOLUS
INTRAVENOUS | Status: DC | PRN
  Administered 2023-10-27: 60 mg via INTRAVENOUS

## 2023-10-27 NOTE — Op Note (Signed)
[facility] Gastroenterology Patient Name: [name] Procedure Date: 10/27/2023 10:52 AM MRN: [id number] Account #: [id number] Date of Birth: 03/22/1966 Admit Type: Outpatient Age: 58 Room: [facility] ENDO ROOM 2 Gender: Female Note Status: Finalized Instrument Name: Prentice Docker [id number] Procedure:             Colonoscopy Indications:           Iron deficiency anemia Providers:             Wyline Mood MD, MD Medicines:             Monitored Anesthesia Care Complications:         No immediate complications. Procedure:             Pre-Anesthesia Assessment:                        - Prior to the procedure, a History and Physical was                         performed, and patient medications, allergies and                         sensitivities were reviewed. The patient's tolerance                         of previous anesthesia was reviewed.                        - The risks and benefits of the procedure and the                         sedation options and risks were discussed with the                         patient. All questions were answered and informed                         consent was obtained.                        - ASA Grade Assessment: II - A patient with mild                         systemic disease.                        After obtaining informed consent, the colonoscope was                         passed under direct vision. Throughout the procedure,                         the patient's blood pressure, pulse, and oxygen                         saturations were monitored continuously. The                         Colonoscope was introduced through the anus with the  intention of advancing to the cecum. The scope was                         advanced to the sigmoid colon before the procedure was                         aborted. Medications were given. The colonoscopy was                         performed with ease. The patient  tolerated the                         procedure well. The quality of the bowel preparation                         was inadequate. Findings:      The perianal and digital rectal examinations were normal.      A large amount of semi-liquid stool was found in the recto-sigmoid       colon, interfering with visualization. Impression:            - Preparation of the colon was inadequate.                        - Stool in the recto-sigmoid colon.                        - No specimens collected. Recommendation:        - Discharge patient to home [Means].                        - Discharge patient to home (with escort).                        - Resume previous diet.                        - Continue present medications.                        - Repeat colonoscopy in 2 weeks because the bowel                         preparation was suboptimal. Procedure Code(s):     --- Professional ---                        [phone], 53, Colonoscopy, flexible; diagnostic,                         including collection of specimen(s) by brushing or                         washing, when performed (separate procedure) Diagnosis Code(s):     --- Professional ---                        D50.9, Iron deficiency anemia, unspecified CPT copyright 2022 American Medical Association. All rights reserved. The codes documented in this report are preliminary and upon coder review may  be revised to meet current compliance requirements. Sharlet Salina  Tobi Bastos, MD Wyline Mood MD, MD 10/27/2023 11:27:07 AM This report has been signed electronically. Number of Addenda: 0 Note Initiated On: 10/27/2023 10:52 AM Total Procedure Duration: 0 hours 0 minutes 33 seconds  Estimated Blood Loss:  Estimated blood loss: none. Estimated blood loss: none.      [facility]

## 2023-10-27 NOTE — Anesthesia Postprocedure Evaluation (Signed)
Anesthesia Post Note  Patient: [name]  Procedure(s) Performed: ESOPHAGOGASTRODUODENOSCOPY (EGD) WITH PROPOFOL COLONOSCOPY WITH PROPOFOL  Patient location during evaluation: Endoscopy Anesthesia Type: General Level of consciousness: awake and alert Pain management: pain level controlled Vital Signs Assessment: post-procedure vital signs reviewed and stable Respiratory status: spontaneous breathing, nonlabored ventilation, respiratory function stable and patient connected to nasal cannula oxygen Cardiovascular status: blood pressure returned to baseline and stable Postop Assessment: no apparent nausea or vomiting Anesthetic complications: no   No notable events documented.   Last Vitals:  Vitals:   10/27/23 1140 10/27/23 1146  BP:  (!) 141/81  Pulse: (!) 49 (!) 51  Resp:    Temp:    SpO2: 96% 100%    Last Pain:  Vitals:   10/27/23 1120  TempSrc: Temporal  PainSc:                  Cleda Mccreedy Griselda Bramblett

## 2023-10-27 NOTE — Op Note (Signed)
[facility] Gastroenterology Patient Name: [name] Procedure Date: 10/27/2023 10:52 AM MRN: [id number] Account #: [id number] Date of Birth: 02-19-1966 Admit Type: Outpatient Age: 58 Room: [facility] ENDO ROOM 2 Gender: Female Note Status: Finalized Instrument Name: Patton Salles Endoscope [id number] Procedure:             Upper GI endoscopy Indications:           Iron deficiency anemia Providers:             Wyline Mood MD, MD Medicines:             Monitored Anesthesia Care Complications:         No immediate complications. Procedure:             Pre-Anesthesia Assessment:                        - Prior to the procedure, a History and Physical was                         performed, and patient medications, allergies and                         sensitivities were reviewed. The patient's tolerance                         of previous anesthesia was reviewed.                        - The risks and benefits of the procedure and the                         sedation options and risks were discussed with the                         patient. All questions were answered and informed                         consent was obtained.                        - ASA Grade Assessment: II - A patient with mild                         systemic disease.                        After obtaining informed consent, the endoscope was                         passed under direct vision. Throughout the procedure,                         the patient's blood pressure, pulse, and oxygen                         saturations were monitored continuously. The Endoscope                         was introduced through the mouth, and advanced to the  jejunum. The upper GI endoscopy was accomplished with                         ease. The patient tolerated the procedure well. Findings:      The esophagus was normal.      Evidence of a gastric bypass was found. A gastric pouch with a small        size was found. The staple line appeared intact. The gastrojejunal       anastomosis was characterized by healthy appearing mucosa. This was       traversed. The pouch-to-jejunum limb was characterized by healthy       appearing mucosa. The jejunojejunal anastomosis was characterized by       healthy appearing mucosa. The excluded stomach was not examined as it       could not be found.      The examined jejunum was normal. Impression:            - Normal esophagus.                        - Gastric bypass with a small-sized pouch and intact                         staple line. Gastrojejunal anastomosis characterized                         by healthy appearing mucosa.                        - Normal examined jejunum.                        - No specimens collected. Recommendation:        - Perform a colonoscopy today. Procedure Code(s):     --- Professional ---                        [phone], Esophagogastroduodenoscopy, flexible,                         transoral; diagnostic, including collection of                         specimen(s) by brushing or washing, when performed                         (separate procedure) Diagnosis Code(s):     --- Professional ---                        U04.54, Bariatric surgery status                        D50.9, Iron deficiency anemia, unspecified CPT copyright 2022 American Medical Association. All rights reserved. The codes documented in this report are preliminary and upon coder review may  be revised to meet current compliance requirements. Wyline Mood, MD Wyline Mood MD, MD 10/27/2023 11:17:41 AM This report has been signed electronically. Number of Addenda: 0 Note Initiated On: 10/27/2023 10:52 AM Estimated Blood Loss:  Estimated blood loss: none.      [facility]

## 2023-10-27 NOTE — H&P (Signed)
Wyline Mood, MD [address], [address], [place], [place], [address] [address], [place], [place], [address] Phone: [phone]  Fax: [phone]  Primary Care Physician:  Allegra Grana, FNP   Pre-Procedure History & Physical: HPI:  [name] is a 58 y.o. female is here for an endoscopy and colonoscopy    Past Medical History:  Diagnosis Date   Anemia    Anxiety and depression    Chronic back pain    DM2 (diabetes mellitus, type 2) (HCC)    improved since gastric bypass   Headache    stress/sinus - 2-3x/wk   History of left shoulder fracture    HLD (hyperlipidemia)    HTN (hypertension)    improved since gastric bypass   Hypothyroid    Left leg weakness    s/p back surgery   Migraines    Obesity    Pernicious anemia    S/P insertion of spinal cord stimulator    Seizure (HCC)    told she had a seizure as young child, not sure of context; mother states she didnt witness   Wears contact lenses     Past Surgical History:  Procedure Laterality Date   Abd [place] - gallstones  5/03   BACK SURGERY  2009   decompressive laminectomy   BARIATRIC SURGERY  03/02/2012   gastric bypass; Dr Smitty Cords   CHOLECYSTECTOMY  6/03   COLONOSCOPY WITH PROPOFOL N/A 05/01/2017   Procedure: COLONOSCOPY WITH PROPOFOL;  Surgeon: Midge Minium, MD;  Location: [facility];  Service: Gastroenterology;  Laterality: N/A;   DILATION AND CURETTAGE OF UTERUS  1/05   ESOPHAGOGASTRODUODENOSCOPY (EGD) WITH PROPOFOL N/A 05/01/2017   Procedure: ESOPHAGOGASTRODUODENOSCOPY (EGD) WITH PROPOFOL;  Surgeon: Midge Minium, MD;  Location: [facility];  Service: Gastroenterology;  Laterality: N/A;   KNEE SURGERY     L - arthroscopic   LIVER BIOPSY  6/03   fatty liver   LUMBAR SPINE SURGERY  2015   Dr. Amalia Greenhouse   NASAL SINUS SURGERY     PANNICULECTOMY  08/2013   [facility], Dr. Diamond Nickel   SHOULDER ARTHROSCOPY Left 10/06/2019   sleep study - apnea  04/2001   improved since  gastric bypass    Prior to Admission medications   Medication Sig Start Date End Date Taking? Authorizing Provider  busPIRone (BUSPAR) 5 MG tablet Take 1 tablet (5 mg total) by mouth 3 (three) times daily. 06/22/23   Allegra Grana, FNP  Cholecalciferol 1.25 MG (50000 UT) TABS 50,000 units PO qwk for 4 weeks. 07/16/23   Allegra Grana, FNP  cyanocobalamin (VITAMIN B12) 1000 MCG/ML injection 1000 MCG (1 ML) INTRAMUSCULAR INJECTION IN THE THIGH ( VASTUS LATERALIS) ONCE PER MONTH. 05/25/23   Allegra Grana, FNP  cyclobenzaprine (FLEXERIL) 10 MG tablet Take 10 mg by mouth daily as needed. 09/14/23   [provider]  Estradiol-Norethindrone Acet 0.5-0.1 MG tablet TAKE 1 TABLET BY MOUTH EVERY DAY 09/21/23   Doreene Burke, CNM  fentaNYL (DURAGESIC) 25 MCG/HR 1 patch every 3 (three) days. Patient is taking 12mg  instead of 25 mg 03/19/21   [provider]  gabapentin (NEURONTIN) 100 MG capsule Take 600 mg by mouth 3 (three) times daily.    [provider]  methylPREDNISolone (MEDROL DOSEPAK) 4 MG TBPK tablet Take by mouth as directed. 09/07/23   [provider]  mupirocin ointment (BACTROBAN) 2 % Apply 1 application topically to the affected area(s) 2 (two) times daily. 06/22/23  Allegra Grana, FNP  NEEDLE, DISP, 25 G (EASY TOUCH FLIPLOCK NEEDLES) 25G X 1" MISC Used to give b12 injections monthly 05/15/22   Allegra Grana, FNP  ondansetron (ZOFRAN-ODT) 4 MG disintegrating tablet Take 1 tablet (4 mg total) by mouth every 8 (eight) hours as needed for nausea or vomiting. 10/14/21   Chesley Noon, MD  Oxycodone HCl 10 MG TABS SMARTSIG:1 Tablet(s) By Mouth Every 8-12 Hours PRN 04/29/22   [provider]  promethazine (PHENERGAN) 12.5 MG tablet TAKE 1 TABLET (12.5 MG TOTAL) BY MOUTH EVERY 8 (EIGHT) HOURS AS NEEDED FOR NAUSEA OR VOMITING. 06/27/21   Allegra Grana, FNP  rizatriptan (MAXALT-MLT) 10 MG disintegrating tablet Take 1 tablet earliest onset  of migraine.  May repeat in 2 hours if needed.  Maximum 2 tablets in 24 hours. 06/19/21   Drema [place], DO  Syringe/Needle, Disp, (SYRINGE 3CC/25GX1") 25G X 1" 3 ML MISC Use to give b12 injections monthly 05/15/22   Allegra Grana, FNP  traZODone (DESYREL) 100 MG tablet Take 1 tablet by mouth everyday at bedtime 08/06/23   Allegra Grana, FNP  venlafaxine XR (EFFEXOR-XR) 75 MG 24 hr capsule TAKE 1 CAPSULE BY MOUTH DAILY WITH BREAKFAST. 08/18/23   Allegra Grana, FNP    Allergies as of 09/30/2023 - Review Complete 09/30/2023  Allergen Reaction Noted   Latex Rash 03/31/2018   Penicillins Itching and Swelling 12/28/2007    Family History  Problem Relation Age of Onset   Lung cancer Mother    Diabetes Mother    Heart failure Mother    Lung cancer Father    Diabetes Sister    Fibromyalgia Sister    Colon cancer Maternal Aunt    Skin cancer Paternal Aunt    Skin cancer Paternal Uncle     Social History   Socioeconomic History   Marital status: Married    Spouse name: Not on file   Number of children: Not on file   Years of education: Not on file   Highest education level: Not on file  Occupational History   Not on file  Tobacco Use   Smoking status: Never   Smokeless tobacco: Never   Tobacco comments:    tobacco use - no  Vaping Use   Vaping status: Never Used  Substance and Sexual Activity   Alcohol use: No   Drug use: No   Sexual activity: Not Currently    Birth control/protection: None  Other Topics Concern   Not on file  Social History Narrative   Partner- [name], no children; does not get regular exercise.    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 [employer]      Mom moved 05/2023 due to lung cancer         Social Drivers of Health   Financial Resource Strain: Low Risk  (04/03/2023)   Received from [facility]   Overall Financial Resource Strain (CARDIA)    Difficulty of Paying Living  Expenses: Not hard at all  Food Insecurity: No Food Insecurity (04/03/2023)   [facility] from [facility]   Hunger Vital Sign    Worried About Running Out of Food in the Last Year: Never true    Ran Out of Food in the Last Year: Never true  Transportation Needs: No Transportation Needs (04/03/2023)   Received from [facility]  PRAPARE - Transportation    In the past 12 months, has lack of transportation kept you from medical appointments or from getting medications?: No    Lack of Transportation (Non-Medical): No  Physical Activity: Not on file  Stress: Not on file  Social Connections: Not on file  Intimate Partner Violence: Not on file    Review of Systems: See HPI, otherwise negative ROS  Physical Exam: LMP 11/28/2019 (Approximate)  General:   Alert,  pleasant and cooperative in NAD Head:  Normocephalic and atraumatic. Neck:  Supple; no masses or thyromegaly. Lungs:  Clear throughout to auscultation, normal respiratory effort.    Heart:  +S1, +S2, Regular rate and rhythm, No edema. Abdomen:  Soft, nontender and nondistended. Normal bowel sounds, without guarding, and without rebound.   Neurologic:  Alert and  oriented x4;  grossly normal neurologically.  Impression/Plan: [name] is here for an endoscopy and colonoscopy  to be performed for  evaluation of iron deficiency anemia    Risks, benefits, limitations, and alternatives regarding endoscopy have been reviewed with the patient.  Questions have been answered.  All parties agreeable.   Wyline Mood, MD  10/27/2023, 10:20 AM

## 2023-10-27 NOTE — Anesthesia Procedure Notes (Signed)
Date/Time: 10/27/2023 11:11 AM  Performed by: Malva Cogan, CRNAPre-anesthesia Checklist: Patient identified, Emergency Drugs available, Suction available, Patient being monitored and Timeout performed Patient Re-evaluated:Patient Re-evaluated prior to induction Oxygen Delivery Method: Nasal cannula Induction Type: IV induction Airway Equipment and Method: Bite block Placement Confirmation: CO2 detector and positive ETCO2

## 2023-10-27 NOTE — Anesthesia Preprocedure Evaluation (Addendum)
Anesthesia Evaluation  Patient identified by MRN, date of birth, ID band Patient awake    Reviewed: Allergy & Precautions, NPO status , Patient's Chart, lab work & pertinent test results  History of Anesthesia Complications Negative for: history of anesthetic complications  Airway Mallampati: III  TM Distance: <3 FB Neck ROM: full    Dental  (+) Chipped, Poor Dentition   Pulmonary neg pulmonary ROS, neg shortness of breath   Pulmonary exam normal        Cardiovascular Exercise Tolerance: Good hypertension, (-) angina Normal cardiovascular exam     Neuro/Psych  Headaches, Seizures -, Well Controlled,  PSYCHIATRIC DISORDERS      TIA   GI/Hepatic Neg liver ROS, PUD,GERD  Controlled,,  Endo/Other  diabetes, Type 2Hypothyroidism    Renal/GU negative Renal ROS  negative genitourinary   Musculoskeletal   Abdominal   Peds  Hematology negative hematology ROS (+)   Anesthesia Other Findings Past Medical History: No date: Anemia No date: Anxiety and depression No date: Chronic back pain No date: DM2 (diabetes mellitus, type 2) (HCC)     Comment:  improved since gastric bypass No date: Headache     Comment:  stress/sinus - 2-3x/wk No date: History of left shoulder fracture No date: HLD (hyperlipidemia) No date: HTN (hypertension)     Comment:  improved since gastric bypass No date: Hypothyroid No date: Left leg weakness     Comment:  s/p back surgery No date: Migraines No date: Obesity No date: Pernicious anemia No date: S/P insertion of spinal cord stimulator No date: Seizure [facility])     Comment:  told she had a seizure as young child, not sure of               context; mother states she didnt witness No date: Wears contact lenses  Past Surgical History: 5/03: Abd [place] - gallstones 2009: BACK SURGERY     Comment:  decompressive laminectomy 03/02/2012: BARIATRIC SURGERY     Comment:  gastric bypass; Dr  Smitty Cords 6/03: CHOLECYSTECTOMY 05/01/2017: COLONOSCOPY WITH PROPOFOL; N/A     Comment:  Procedure: COLONOSCOPY WITH PROPOFOL;  Surgeon: Midge Minium, MD;  Location: [facility];  Service:               Gastroenterology;  Laterality: N/A; 1/05: DILATION AND CURETTAGE OF UTERUS 05/01/2017: ESOPHAGOGASTRODUODENOSCOPY (EGD) WITH PROPOFOL; N/A     Comment:  Procedure: ESOPHAGOGASTRODUODENOSCOPY (EGD) WITH               PROPOFOL;  Surgeon: Midge Minium, MD;  Location: [facility];  Service: Gastroenterology;  Laterality:               N/A; No date: KNEE SURGERY     Comment:  L - arthroscopic 6/03: LIVER BIOPSY     Comment:  fatty liver 2015: LUMBAR SPINE SURGERY     Comment:  Dr. Amalia Greenhouse No date: NASAL SINUS SURGERY 08/2013: PANNICULECTOMY     Comment:  [facility], Dr. Diamond Nickel 10/06/2019: SHOULDER ARTHROSCOPY; Left 04/2001: sleep study - apnea     Comment:  improved since gastric bypass  BMI    Body Mass Index: 34.28 kg/m      Reproductive/Obstetrics negative OB ROS  Anesthesia Physical Anesthesia Plan  ASA: 3  Anesthesia Plan: General   Post-op Pain Management:    Induction: Intravenous  PONV Risk Score and Plan: Propofol infusion and TIVA  Airway Management Planned: Natural Airway and Nasal Cannula  Additional Equipment:   Intra-op Plan:   Post-operative Plan:   Informed Consent: I have reviewed the patients History and Physical, chart, labs and discussed the procedure including the risks, benefits and alternatives for the proposed anesthesia with the patient or authorized representative who has indicated his/her understanding and acceptance.     Dental Advisory Given  Plan Discussed with: Anesthesiologist, CRNA and Surgeon  Anesthesia Plan Comments: (Patient consented for risks of anesthesia including but not limited to:  - adverse reactions to medications - risk of airway  placement if required - damage to eyes, teeth, lips or other oral mucosa - nerve damage due to positioning  - sore throat or hoarseness - Damage to heart, brain, nerves, lungs, other parts of body or loss of life  Patient voiced understanding and assent.)       Anesthesia Quick Evaluation

## 2023-10-27 NOTE — Transfer of Care (Signed)
Immediate Anesthesia Transfer of Care Note  Patient: [name]  Procedure(s) Performed: ESOPHAGOGASTRODUODENOSCOPY (EGD) WITH PROPOFOL COLONOSCOPY WITH PROPOFOL  Patient Location: PACU  Anesthesia Type:General  Level of Consciousness: drowsy  Airway & Oxygen Therapy: Patient Spontanous Breathing  Post-op Assessment: Report given to RN and Post -op Vital signs reviewed and stable  Post vital signs: Reviewed and stable  Last Vitals:  Vitals Value Taken Time  BP 109/69 10/27/23 1122  Temp    Pulse 54 10/27/23 1123  Resp    SpO2 97 % 10/27/23 1123  Vitals shown include unfiled device data.  Last Pain:  Vitals:   10/27/23 1102  TempSrc: Temporal  PainSc: 0-No pain         Complications: No notable events documented.

## 2023-10-28 ENCOUNTER — Encounter: Payer: Self-pay | Admitting: Gastroenterology

## 2023-10-28 ENCOUNTER — Telehealth: Payer: Self-pay

## 2023-10-28 NOTE — Telephone Encounter (Signed)
Called patient but had to leave her a detailed message letting her know that Dr. Tobi Bastos wants her to repeat her colonoscopy in 2 weeks since the bowel preparation was suboptimal.

## 2023-11-04 NOTE — Telephone Encounter (Signed)
 Called patient again and left another detailed message letting her know that Dr. Tobi Bastos wanted her to repeat her colonoscopy. I will also send her a letter to reach out to [place] to reschedule her colonoscopy so to please call us back.

## 2023-11-11 ENCOUNTER — Encounter: Payer: Self-pay | Admitting: Certified Nurse Midwife

## 2023-11-11 ENCOUNTER — Ambulatory Visit (INDEPENDENT_AMBULATORY_CARE_PROVIDER_SITE_OTHER): Payer: BC Managed Care – PPO | Admitting: Certified Nurse Midwife

## 2023-11-11 VITALS — BP 142/85 | HR 76 | Ht 65.5 in | Wt 212.2 lb

## 2023-11-11 DIAGNOSIS — E669 Obesity, unspecified: Secondary | ICD-10-CM | POA: Diagnosis not present

## 2023-11-11 DIAGNOSIS — Z7689 Persons encountering health services in other specified circumstances: Secondary | ICD-10-CM | POA: Diagnosis not present

## 2023-11-11 DIAGNOSIS — Z79899 Other long term (current) drug therapy: Secondary | ICD-10-CM | POA: Diagnosis not present

## 2023-11-11 MED ORDER — ESTRADIOL-NORETHINDRONE ACET 0.5-0.1 MG PO TABS: 1.0000 | ORAL_TABLET | Freq: Every day | ORAL | 3 refills | Status: AC

## 2023-11-11 NOTE — Addendum Note (Signed)
 Addended by: Mechele Claude on: 11/11/2023 03:22 PM   Modules accepted: Level of Service

## 2023-11-11 NOTE — Progress Notes (Signed)
 Subjective:  [name] is a 59 y.o. G0P0000 at [facility] being seen today for weight loss management- initial visit., and medication refill.    Patient reports General ROS: negative and reports previous weight loss attempts Management changes made at the last visit include:  adding medication, stopping medication. She had used phentermine but had a change in her blood pressure that did not allow her to continue medicaiton use. She is interested in the use of GLP!- Ozempic.  Onset was gradual due to covid along with difficulty exercisine due to chronic back issues.   Associated symptoms include: fatigue, depression, anxiety, change in clothing fit and menstrual changes. Previous/Current treatment includes: small frequent feedings, nutritional supplement, vitamin supplement,, antidepressant, vitamin B-12 injections and appetite stimulant.  Pertinent medical history includes: history of gastric bypass, type 2 diabetes,  anxiety and psychiatric illness.  Risk factors include: poor exercise due to back problems.  The patient has a surgical history of: bariatric surgery, cholecystectomy.  Pertinent social history includes: none  Past evaluation has included: Comprehensive metabolic profile, , thyroid panel  . Pt has history of hypothyroid , but it has resolved. She has not been on medication since 2021  Past treatment has included: small frequent feedings, nutritional supplement, vitamin supplement, antidepressant, vitamin B-12 injections, appetite stimulant, exercise management.  The following portions of the patient's history were reviewed and updated as appropriate: allergies, current medications, past family history, past medical history, past social history, past surgical history and problem list.   Objective:   Vitals:   11/11/23 1411  BP: (!) 142/85  Pulse: 76  Weight: 212 lb 3.2 oz (96.3 kg)  Height: 5' 5.5" (1.664 m)    General:  Alert, oriented and cooperative. Patient is in  no acute distress.  PE: Well groomed female in no current distress,   Mental Status: Normal mood and affect. Normal behavior. Normal judgment and thought content.   Current BMI: Body mass index is 34.77 kg/m.   Assessment and Plan:  Obesity  1. Medication management: HRT, pt has been on this medication . She denies any issues with use and would like to continue with use. Risks and benefits reviewed.  - Estradiol-Norethindrone Acet 0.5-0.1 MG tablet; Take 1 tablet by mouth daily.  Dispense: 84 tablet; Refill: 3 .   2. Encounter for weight management (Primary) - Thyroid Panel With TSH - Comprehensive metabolic panel   Plan: low carb, High protein diet Discussed starting on GLP- 1 given history of hypothroid. She is aware of risk and benfits of use. Will review current labs , it in normal range will start trial of ozempic.  Reviewed side-effects common to both medications and expected outcomes. Increase daily water intake to at least 8 bottle a day, every day.  RTC in 4 weeks after starting injection- visit to check weight & BP , dose change if needed.     Please refer to After Visit Summary for other counseling recommendations.    Doreene Burke, CNM      Consider the Low Glycemic Index Diet and 6 smaller meals daily .  This boosts your metabolism and regulates your sugars:   Use the protein bar by Atkins because they have lots of fiber in them  Find the low carb flatbreads, tortillas and pita breads for sandwiches:  Joseph's makes a pita bread and a flat bread , available at [facility] and [employer]; Toufayah makes a low carb flatbread available at [employer] and [employer] that is 9 net carbs  and 100 cal [place] makes a low carb whole wheat tortilla available at [employer] stores with 6 net carbs and 210 cal  [place] yogurt can still have a lot of carbs .  Dannon Light N fit has 80 cal and 8 carbs

## 2023-11-11 NOTE — Patient Instructions (Signed)
 Preventive Care 16-58 Years Old, Female  Preventive care refers to lifestyle choices and visits with your health care provider that can promote health and wellness. Preventive care visits are also called wellness exams.  What can I expect for my preventive care visit?  Counseling  Your health care provider may ask you questions about your:  Medical history, including:  Past medical problems.  Family medical history.  Pregnancy history.  Current health, including:  Menstrual cycle.  Method of birth control.  Emotional well-being.  Home life and relationship well-being.  Sexual activity and sexual health.  Lifestyle, including:  Alcohol, nicotine or tobacco, and drug use.  Access to firearms.  Diet, exercise, and sleep habits.  Work and work Astronomer.  Sunscreen use.  Safety issues such as seatbelt and bike helmet use.  Physical exam  Your health care provider will check your:  Height and weight. These may be used to calculate your BMI (body mass index). BMI is a measurement that tells if you are at a healthy weight.  Waist circumference. This measures the distance around your waistline. This measurement also tells if you are at a healthy weight and may help predict your risk of certain diseases, such as type 2 diabetes and high blood pressure.  Heart rate and blood pressure.  Body temperature.  Skin for abnormal spots.  What immunizations do I need?    Vaccines are usually given at various ages, according to a schedule. Your health care provider will recommend vaccines for you based on your age, medical history, and lifestyle or other factors, such as travel or where you work.  What tests do I need?  Screening  Your health care provider may recommend screening tests for certain conditions. This may include:  Lipid and cholesterol levels.  Diabetes screening. This is done by checking your blood sugar (glucose) after you have not eaten for a while (fasting).  Pelvic exam and Pap test.  Hepatitis B test.  Hepatitis C  test.  HIV (human immunodeficiency virus) test.  STI (sexually transmitted infection) testing, if you are at risk.  Lung cancer screening.  Colorectal cancer screening.  Mammogram. Talk with your health care provider about when you should start having regular mammograms. This may depend on whether you have a family history of breast cancer.  BRCA-related cancer screening. This may be done if you have a family history of breast, ovarian, tubal, or peritoneal cancers.  Bone density scan. This is done to screen for osteoporosis.  Talk with your health care provider about your test results, treatment options, and if necessary, the need for more tests.  Follow these instructions at home:  Eating and drinking    Eat a diet that includes fresh fruits and vegetables, whole grains, lean protein, and low-fat dairy products.  Take vitamin and mineral supplements as recommended by your health care provider.  Do not drink alcohol if:  Your health care provider tells you not to drink.  You are pregnant, may be pregnant, or are planning to become pregnant.  If you drink alcohol:  Limit how much you have to 0-1 drink a day.  Know how much alcohol is in your drink. In the [place] one drink equals one 12 oz bottle of beer (355 mL), one 5 oz glass of wine (148 mL), or one 1 oz glass of hard liquor (44 mL).  Lifestyle  Brush your teeth every morning and night with fluoride toothpaste. Floss one time each day.  Exercise for at least  30 minutes 5 or more days each week.  Do not use any products that contain nicotine or tobacco. These products include cigarettes, chewing tobacco, and vaping devices, such as e-cigarettes. If you need help quitting, ask your health care provider.  Do not use drugs.  If you are sexually active, practice safe sex. Use a condom or other form of protection to prevent STIs.  If you do not wish to become pregnant, use a form of birth control. If you plan to become pregnant, see your health care provider for a  prepregnancy visit.  Take aspirin only as told by your health care provider. Make sure that you understand how much to take and what form to take. Work with your health care provider to find out whether it is safe and beneficial for you to take aspirin daily.  Find healthy ways to manage stress, such as:  Meditation, yoga, or listening to music.  Journaling.  Talking to a trusted person.  Spending time with friends and family.  Minimize exposure to UV radiation to reduce your risk of skin cancer.  Safety  Always wear your seat belt while driving or riding in a vehicle.  Do not drive:  If you have been drinking alcohol. Do not ride with someone who has been drinking.  When you are tired or distracted.  While texting.  If you have been using any mind-altering substances or drugs.  Wear a helmet and other protective equipment during sports activities.  If you have firearms in your house, make sure you follow all gun safety procedures.  Seek help if you have been physically or sexually abused.  What's next?  Visit your health care provider once a year for an annual wellness visit.  Ask your health care provider how often you should have your eyes and teeth checked.  Stay up to date on all vaccines.  This information is not intended to replace advice given to you by your health care provider. Make sure you discuss any questions you have with your health care provider.  Document Revised: 02/27/2021 Document Reviewed: 02/27/2021  Elsevier Patient Education  2024 [employer].

## 2023-11-12 LAB — COMPREHENSIVE METABOLIC PANEL WITH GFR
ALT: 75 [IU]/L — ABNORMAL HIGH (ref 0–32)
AST: 80 [IU]/L — ABNORMAL HIGH (ref 0–40)
Albumin: 3.8 g/dL (ref 3.8–4.9)
Alkaline Phosphatase: 107 [IU]/L (ref 44–121)
BUN/Creatinine Ratio: 30 — ABNORMAL HIGH (ref 9–23)
BUN: 24 mg/dL (ref 6–24)
Bilirubin Total: 0.2 mg/dL (ref 0.0–1.2)
CO2: 25 mmol/L (ref 20–29)
Calcium: 9 mg/dL (ref 8.7–10.2)
Chloride: 104 mmol/L (ref 96–106)
Creatinine, Ser: 0.8 mg/dL (ref 0.57–1.00)
Globulin, Total: 2.4 g/dL (ref 1.5–4.5)
Glucose: 73 mg/dL (ref 70–99)
Potassium: 5 mmol/L (ref 3.5–5.2)
Sodium: 143 mmol/L (ref 134–144)
Total Protein: 6.2 g/dL (ref 6.0–8.5)
eGFR: 86 mL/min/{1.73_m2}

## 2023-11-12 LAB — THYROID PANEL WITH TSH
Free Thyroxine Index: 1.7 (ref 1.2–4.9)
T3 Uptake Ratio: 25 % (ref 24–39)
T4, Total: 6.8 ug/dL (ref 4.5–12.0)
TSH: 5.31 u[IU]/mL — ABNORMAL HIGH (ref 0.450–4.500)

## 2023-11-15 ENCOUNTER — Telehealth: Payer: Self-pay | Admitting: Family

## 2023-11-15 NOTE — Telephone Encounter (Signed)
 Call pt  [name], CNM shared labs with me from recent follow up  Please sch f/u to discuss and repeat liver enzymes and thyroid studies as abnormal

## 2023-11-16 ENCOUNTER — Encounter: Payer: Self-pay | Admitting: Certified Nurse Midwife

## 2023-11-16 NOTE — Telephone Encounter (Signed)
 Pt has been scheduled to see you on 11/19/23

## 2023-11-19 ENCOUNTER — Ambulatory Visit: Payer: BC Managed Care – PPO | Admitting: Family

## 2023-11-19 ENCOUNTER — Encounter: Payer: Self-pay | Admitting: Family

## 2023-11-19 ENCOUNTER — Other Ambulatory Visit: Payer: Self-pay | Admitting: Family

## 2023-11-19 VITALS — BP 122/60 | HR 72 | Temp 98.2°F | Resp 20 | Ht 65.5 in | Wt 206.2 lb

## 2023-11-19 DIAGNOSIS — R7303 Prediabetes: Secondary | ICD-10-CM

## 2023-11-19 DIAGNOSIS — R899 Unspecified abnormal finding in specimens from other organs, systems and tissues: Secondary | ICD-10-CM | POA: Diagnosis not present

## 2023-11-19 DIAGNOSIS — G4700 Insomnia, unspecified: Secondary | ICD-10-CM | POA: Diagnosis not present

## 2023-11-19 DIAGNOSIS — M8580 Other specified disorders of bone density and structure, unspecified site: Secondary | ICD-10-CM

## 2023-11-19 DIAGNOSIS — F418 Other specified anxiety disorders: Secondary | ICD-10-CM

## 2023-11-19 DIAGNOSIS — Z780 Asymptomatic menopausal state: Secondary | ICD-10-CM

## 2023-11-19 DIAGNOSIS — G8929 Other chronic pain: Secondary | ICD-10-CM | POA: Diagnosis not present

## 2023-11-19 DIAGNOSIS — E038 Other specified hypothyroidism: Secondary | ICD-10-CM

## 2023-11-19 DIAGNOSIS — Z1231 Encounter for screening mammogram for malignant neoplasm of breast: Secondary | ICD-10-CM

## 2023-11-19 DIAGNOSIS — R635 Abnormal weight gain: Secondary | ICD-10-CM

## 2023-11-19 MED ORDER — PREGABALIN 25 MG PO CAPS: 25.0000 mg | ORAL_CAPSULE | Freq: Three times a day (TID) | ORAL | 2 refills | Status: DC

## 2023-11-19 MED ORDER — PREGABALIN 100 MG PO CAPS: 100.0000 mg | ORAL_CAPSULE | Freq: Three times a day (TID) | ORAL | 2 refills | Status: DC

## 2023-11-19 MED ORDER — WEGOVY 0.25 MG/0.5ML ~~LOC~~ SOAJ
0.2500 mg | SUBCUTANEOUS | 3 refills | Status: DC
Start: 2023-11-19 — End: 2023-11-20

## 2023-11-19 MED ORDER — TRAZODONE HCL 50 MG PO TABS
25.0000 mg | ORAL_TABLET | Freq: Every evening | ORAL | 3 refills | Status: DC | PRN
Start: 2023-11-19 — End: 2023-12-14

## 2023-11-19 NOTE — Patient Instructions (Addendum)
 Your lab work shows subclinical hypothyroidism.   We will not start medication at this time however I want to ensure we continue to follow your thyroid levels as you may convert to overt hypothyroidism.  I have placed a lab order and would like for you to come in lab in 3 months to follow thyroid. You may call to make this appointment.  If you have unintentional weight loss, palpitations, heat/cold intolerance ( these are few symptoms of hypothyroidism), please let me know.   Increase trazodone to 150mg    Increase Lyrica to 125mg  three times daily; please let Rulon Abide know.   Trial of wegovy ( review below)  As discussed, since restarting 3 new medications today.  Please start them 1 at a time that we can monitor for side effects.   We have discussed starting non insulin daily injectable medication called Wegovy  which is a glucagon like peptide (GLP 1) agonist and works by delaying gastric emptying and increasing insulin secretion.It is given once per week. Most patients see significant weight loss with this drug class.   You may NOT take either medication if you or your family has history of thyroid, parathyroid, OR adrenal cancer. Please confirm you and your family does NOT have this history as this drug class has black box warning on this medication for that reason.   Please follow  directions on prescription and slowly increase from 0.25mg  [place] once per week ;stay here for 4 weeks. You may then increase to 0.5mg  [place] once per week and stay there for 4 weeks.  We can slowly titrate further at follow up with goal of no more than 1-2 lbs weight loss per week.  Semaglutide [facility])  Dose (mg) Once Weekly Titration:    If a dose is not tolerated, consider delaying further dose increases for  another 4 weeks.  If you are actively losing weight on a dose, do not increase medication.   Weeks 1 through 4  0.25 mg once weekly  Weeks 5 through 8  0.5 mg once weekly  Weeks 9 though 12  1  mg once weekly  Weeks 13 through 16  1.7 mg once weekly   Semaglutide Injection (Weight Management) What is this medication? SEMAGLUTIDE (SEM a GLOO tide) promotes weight loss. It may also be used to maintain weight loss. It works by decreasing appetite. Changes to diet and exercise are often combined with this medication. This medicine may be used for other purposes; ask your health care provider or pharmacist if you have questions. COMMON BRAND NAME(S): ZOXWRU What should I tell my care team before I take this medication? They need to know if you have any of these conditions: Endocrine tumors (MEN 2) or if someone in your family had these tumors Eye disease, vision problems Gallbladder disease History of depression or mental health disease History of pancreatitis Kidney disease Stomach or intestine problems Suicidal thoughts, plans, or attempt; a previous suicide attempt by you or a family member Thyroid cancer or if someone in your family had thyroid cancer An unusual or allergic reaction to semaglutide, other medications, foods, dyes, or preservatives Pregnant or trying to get pregnant Breast-feeding How should I use this medication? This medication is injected under the skin. You will be taught how to prepare and give it. Take it as directed on the prescription label. It is given once every week (every 7 days). Keep taking it unless your care team tells you to stop. It is important that you  put your used needles and pens in a special sharps container. Do not put them in a trash can. If you do not have a sharps container, call your pharmacist or care team to get one. A special MedGuide will be given to you by the pharmacist with each prescription and refill. Be sure to read this information carefully each time. This medication comes with INSTRUCTIONS FOR USE. Ask your pharmacist for directions on how to use this medication. Read the information carefully. Talk to your pharmacist or  care team if you have questions. Talk to your care team about the use of this medication in children. While it may be prescribed for children as young as 12 years for selected conditions, precautions do apply. Overdosage: If you think you have taken too much of this medicine contact a poison control center or emergency room at once. NOTE: This medicine is only for you. Do not share this medicine with others. What if I miss a dose? If you miss a dose and the next scheduled dose is more than 2 days away, take the missed dose as soon as possible. If you miss a dose and the next scheduled dose is less than 2 days away, do not take the missed dose. Take the next dose at your regular time. Do not take double or extra doses. If you miss your dose for 2 weeks or more, take the next dose at your regular time or call your care team to talk about how to restart this medication. What may interact with this medication? Insulin and other medications for diabetes This list may not describe all possible interactions. Give your health care provider a list of all the medicines, herbs, non-prescription drugs, or dietary supplements you use. Also tell them if you smoke, drink alcohol, or use illegal drugs. Some items may interact with your medicine. What should I watch for while using this medication? Visit your care team for regular checks on your progress. It may be some time before you see the benefit from this medication. Drink plenty of fluids while taking this medication. Check with your care team if you have severe diarrhea, nausea, and vomiting, or if you sweat a lot. The loss of too much body fluid may make it dangerous for you to take this medication. This medication may affect blood sugar levels. Ask your care team if changes in diet or medications are needed if you have diabetes. If you or your family notice any changes in your behavior, such as new or worsening depression, thoughts of harming yourself,  anxiety, other unusual or disturbing thoughts, or memory loss, call your care team right away. Women should inform their care team if they wish to become pregnant or think they might be pregnant. Losing weight while pregnant is not advised and may cause harm to the unborn child. Talk to your care team for more information. What side effects may I notice from receiving this medication? Side effects that you should report to your care team as soon as possible: Allergic reactions--skin rash, itching, hives, swelling of the face, lips, tongue, or throat Change in vision Dehydration--increased thirst, dry mouth, feeling faint or lightheaded, headache, dark yellow or brown urine Gallbladder problems--severe stomach pain, nausea, vomiting, fever Heart palpitations--rapid, pounding, or irregular heartbeat Kidney injury--decrease in the amount of urine, swelling of the ankles, hands, or feet Pancreatitis--severe stomach pain that spreads to your back or gets worse after eating or when touched, fever, nausea, vomiting Thoughts of suicide or self-harm,  worsening mood, feelings of depression Thyroid cancer--new mass or lump in the neck, pain or trouble swallowing, trouble breathing, hoarseness Side effects that usually do not require medical attention (report to your care team if they continue or are bothersome): Diarrhea Loss of appetite Nausea Stomach pain Vomiting This list may not describe all possible side effects. Call your doctor for medical advice about side effects. You may report side effects to FDA at 1-800-FDA-1088. Where should I keep my medication? Keep out of the reach of children and pets. Refrigeration (preferred): Store in the refrigerator. Do not freeze. Keep this medication in the original container until you are ready to take it. Get rid of any unused medication after the expiration date. Room temperature: If needed, prior to cap removal, the pen can be stored at room temperature for  up to 28 days. Protect from light. If it is stored at room temperature, get rid of any unused medication after 28 days or after it expires, whichever is first. It is important to get rid of the medication as soon as you no longer need it or it is expired. You can do this in two ways: Take the medication to a medication take-back program. Check with your pharmacy or law enforcement to find a location. If you cannot return the medication, follow the directions in the MedGuide. NOTE: This sheet is a summary. It may not cover all possible information. If you have questions about this medicine, talk to your doctor, pharmacist, or health care provider.  2023 Elsevier/Gold Standard (2020-11-15 00:00:00)

## 2023-11-19 NOTE — Progress Notes (Signed)
 Assessment & Plan:  Other chronic pain Assessment & Plan: Chronic low back pain, suboptimal control.  She has been following with [name] at [facility] for pain management.  She is unsure if she will continue fentanyl patch.  Asked her to reach out to [place] to further discuss this.  I advised not may resume prescription for Lyrica.  Advised to increase Lyrica to 125mg  three times daily; please let [name] know.   Orders: -     Pregabalin; Take 1 capsule (25 mg total) by mouth 3 (three) times daily. Take with lyrica 100mg  TID  Dispense: 90 capsule; Refill: 2 -     Pregabalin; Take 1 capsule (100 mg total) by mouth 3 (three) times daily. Take with lyrica 25mg  TID.  Dispense: 90 capsule; Refill: 2  Insomnia, unspecified type -     traZODone HCl; Take 0.5-1 tablets (25-50 mg total) by mouth at bedtime as needed for sleep. Take with trazodone 100mg  at bedtime.  Dispense: 30 tablet; Refill: 3  Abnormal laboratory test -     Hepatic function panel; Future  Prediabetes  Asymptomatic postmenopausal state -     DG Bone Density; Future  Encounter for screening mammogram for malignant neoplasm of breast -     3D Screening Mammogram, Left and Right; Future  Weight gain Assessment & Plan:  Interested in weight loss medication.  Counseled on blackbox warning, titration and side effects of Wegovy.  She has previously tried metformin.   Osteopenia, unspecified location Assessment & Plan: Ordered DEXA for surveillance.    Subclinical hypothyroidism Assessment & Plan: Labs indicate subclinical hypothyroidism.  Discussed importance of surveillance as may progress to clinical hypothyroidism.  Previously patient had been on Synthroid.  Will monitor.   Depression with anxiety Assessment & Plan: Suboptimal control .  Increase trazodone 150 mg nightly, Continue buspar 5 mg TID, Effexor 75 mg. Close follow up.       Return precautions given.   Risks, benefits, and alternatives of  the medications and treatment plan prescribed today were discussed, and patient expressed understanding.   Education regarding symptom management and diagnosis given to patient on AVS either electronically or printed.  Return in about 3 months (around 02/19/2024).  Cheryl Plowman, FNP  Subjective:    Patient ID: [name], female    DOB: Aug 02, 1966, 58 y.o.   MRN: [id number]  CC: [name] is a 58 y.o. female who presents today for an acute visit.    HPI:  Complains of chronic low back pain, worsening Pain if walking or sitting for prolonged periods of time.      Numbness and pain radiating to the ankle.   No groin pain, saddle anesthesia, fecal or urinary incontinence  She is driving to [place] and worried about pain control.    She had been seeing pain management [name] whom prescribed fenatanyl  12 mcg and lyrica 100mg  TID. Takes flexeril 10mg  prn every day. She doesn't feel overly sedated. No h/o substance abuse.    She plans to wean off fentanyl patch as [facility] referred to neurosurgery.   No nsaids.   Ct lumbar spine 10/05/23 moderate lumbar scoliosis, mildly increased.  Mild osteopenia.  Lumbar postoperative changes.  Chronic thoracic spinal stimulator, severe right for minimal stenosis L3-L4.  Mild to moderate S1 lateral stenosis suspected.  No hardware loosening.  She has spinal stimulator , placed in 2020.   H/o lumbar decompression, fusion   She is compliant with effexor  75mg  every day, trazodone 100mg  , buspar 5mg  tid.    She is using excredrin.   She is interested in weight loss medication and frustrated by weight gain.  No personal or family thyroid cancer or MEN.  Allergies: Latex and Penicillins Current Outpatient Medications on File Prior to Visit  Medication Sig Dispense Refill   Aspirin-Acetaminophen-Caffeine (EXCEDRIN MIGRAINE PO) Take by mouth.     busPIRone (BUSPAR) 5 MG tablet Take 1 tablet (5 mg total) by  mouth 3 (three) times daily. 270 tablet 2   cyanocobalamin (VITAMIN B12) 1000 MCG/ML injection 1000 MCG (1 ML) INTRAMUSCULAR INJECTION IN THE THIGH ( VASTUS LATERALIS) ONCE PER MONTH. 3 mL 5   cyclobenzaprine (FLEXERIL) 10 MG tablet Take 10 mg by mouth daily as needed.     Estradiol-Norethindrone Acet 0.5-0.1 MG tablet Take 1 tablet by mouth daily. 84 tablet 3   fentaNYL (DURAGESIC) 12 MCG/HR 1 patch every 3 (three) days.     NEEDLE, DISP, 25 G (EASY TOUCH FLIPLOCK NEEDLES) 25G X 1" MISC Used to give b12 injections monthly 50 each 0   ondansetron (ZOFRAN-ODT) 4 MG disintegrating tablet Take 1 tablet (4 mg total) by mouth every 8 (eight) hours as needed for nausea or vomiting. 12 tablet 0   promethazine (PHENERGAN) 12.5 MG tablet TAKE 1 TABLET (12.5 MG TOTAL) BY MOUTH EVERY 8 (EIGHT) HOURS AS NEEDED FOR NAUSEA OR VOMITING. 20 tablet 0   Syringe/Needle, Disp, (SYRINGE 3CC/25GX1") 25G X 1" 3 ML MISC Use to give b12 injections monthly 50 each 0   traZODone (DESYREL) 100 MG tablet Take 1 tablet by mouth everyday at bedtime 90 tablet 3   venlafaxine XR (EFFEXOR-XR) 75 MG 24 hr capsule TAKE 1 CAPSULE BY MOUTH DAILY WITH BREAKFAST. 90 capsule 1   No current facility-administered medications on file prior to visit.    Review of Systems  Constitutional:  Negative for chills and fever.  Respiratory:  Negative for cough.   Cardiovascular:  Negative for chest pain and palpitations.  Gastrointestinal:  Negative for nausea and vomiting.  Musculoskeletal:  Positive for back pain.  Neurological:  Positive for numbness.      Objective:    BP 122/60   Pulse 72   Temp 98.2 F (36.8 C)   Resp 20   Ht 5' 5.5" (1.664 m)   Wt 206 lb 4 oz (93.6 kg)   LMP 11/28/2019 (Approximate)   SpO2 99%   BMI 33.80 kg/m   BP Readings from Last 3 Encounters:  11/19/23 122/60  11/11/23 (!) 142/85  10/27/23 (!) 141/81   Wt Readings from Last 3 Encounters:  11/19/23 206 lb 4 oz (93.6 kg)  11/11/23 212 lb 3.2 oz  (96.3 kg)  10/27/23 206 lb (93.4 kg)    Physical Exam Vitals reviewed.  Constitutional:      Appearance: She is well-developed.  Eyes:     Conjunctiva/sclera: Conjunctivae normal.  Cardiovascular:     Rate and Rhythm: Normal rate and regular rhythm.     Pulses: Normal pulses.     Heart sounds: Normal heart sounds.  Pulmonary:     Effort: Pulmonary effort is normal.     Breath sounds: Normal breath sounds. No wheezing, rhonchi or rales.  Skin:    General: Skin is warm and dry.  Neurological:     Mental Status: She is alert.  Psychiatric:        Speech: Speech normal.        Behavior: Behavior normal.  Thought Content: Thought content normal.

## 2023-11-20 ENCOUNTER — Telehealth: Payer: Self-pay

## 2023-11-20 ENCOUNTER — Other Ambulatory Visit: Payer: Self-pay | Admitting: Family

## 2023-11-20 DIAGNOSIS — R7303 Prediabetes: Secondary | ICD-10-CM

## 2023-11-20 MED ORDER — METFORMIN HCL ER 500 MG PO TB24: 500.0000 mg | ORAL_TABLET | Freq: Every evening | ORAL | 2 refills | Status: DC

## 2023-11-20 NOTE — Telephone Encounter (Signed)
 Spoke to pt she stated that she does NOT want to try Metformin again!

## 2023-11-20 NOTE — Telephone Encounter (Signed)
 DONE

## 2023-11-20 NOTE — Telephone Encounter (Signed)
 Spoke to pt she is awaiting to hear back from pharmacy about a discount card and she will let us know if she would like to maybe try an alternative then

## 2023-11-20 NOTE — Telephone Encounter (Signed)
 Copied from CRM [id number]. Topic: Clinical - Prescription Issue >> Nov 20, 2023 10:04 AM Eunice Blase wrote: Reason for CRM: Pt called regarding the [facility] still is $600.00 with discount, still needs assistance. Please call pt at [phone]

## 2023-11-20 NOTE — Telephone Encounter (Signed)
 Is there another alternative pt can try due to [facility] being to expensive. See previous refill note

## 2023-11-25 ENCOUNTER — Encounter: Payer: Self-pay | Admitting: Family

## 2023-11-25 ENCOUNTER — Other Ambulatory Visit: Payer: Self-pay

## 2023-11-25 NOTE — Assessment & Plan Note (Signed)
 Suboptimal control .  Increase trazodone 150 mg nightly, Continue buspar 5 mg TID, Effexor 75 mg. Close follow up.

## 2023-11-25 NOTE — Assessment & Plan Note (Addendum)
  Interested in weight loss medication.  Counseled on blackbox warning, titration and side effects of Wegovy.  She has previously tried metformin.

## 2023-11-25 NOTE — Assessment & Plan Note (Signed)
 Ordered DEXA for surveillance.

## 2023-11-25 NOTE — Assessment & Plan Note (Signed)
 Chronic low back pain, suboptimal control.  She has been following with Adrian Prince at [facility] for pain management.  She is unsure if she will continue fentanyl patch.  Asked her to reach out to [place] to further discuss this.  I advised not may resume prescription for Lyrica.  Advised to increase Lyrica to 125mg  three times daily; please let Rulon Abide know.

## 2023-11-25 NOTE — Assessment & Plan Note (Signed)
 Labs indicate subclinical hypothyroidism.  Discussed importance of surveillance as may progress to clinical hypothyroidism.  Previously patient had been on Synthroid.  Will monitor.

## 2023-11-26 ENCOUNTER — Ambulatory Visit: Payer: BC Managed Care – PPO | Admitting: Family

## 2023-11-26 NOTE — Progress Notes (Deleted)
 No Show

## 2023-11-27 ENCOUNTER — Ambulatory Visit (INDEPENDENT_AMBULATORY_CARE_PROVIDER_SITE_OTHER): Payer: Managed Care, Other (non HMO) | Admitting: Physician Assistant

## 2023-11-27 DIAGNOSIS — D509 Iron deficiency anemia, unspecified: Secondary | ICD-10-CM

## 2023-11-30 NOTE — Progress Notes (Signed)
 NO SHOW

## 2023-12-13 ENCOUNTER — Other Ambulatory Visit: Payer: Self-pay | Admitting: Family

## 2023-12-13 DIAGNOSIS — G4700 Insomnia, unspecified: Secondary | ICD-10-CM

## 2023-12-17 DIAGNOSIS — M5126 Other intervertebral disc displacement, lumbar region: Secondary | ICD-10-CM | POA: Diagnosis not present

## 2023-12-17 DIAGNOSIS — Z79899 Other long term (current) drug therapy: Secondary | ICD-10-CM | POA: Diagnosis not present

## 2023-12-17 DIAGNOSIS — M47896 Other spondylosis, lumbar region: Secondary | ICD-10-CM | POA: Diagnosis not present

## 2023-12-17 DIAGNOSIS — M51372 Other intervertebral disc degeneration, lumbosacral region with discogenic back pain and lower extremity pain: Secondary | ICD-10-CM | POA: Diagnosis not present

## 2023-12-24 ENCOUNTER — Encounter: Payer: Self-pay | Admitting: Oncology

## 2023-12-30 ENCOUNTER — Other Ambulatory Visit: Payer: Self-pay | Admitting: Family

## 2023-12-30 DIAGNOSIS — F418 Other specified anxiety disorders: Secondary | ICD-10-CM

## 2023-12-31 ENCOUNTER — Other Ambulatory Visit

## 2024-01-09 ENCOUNTER — Other Ambulatory Visit: Payer: Self-pay | Admitting: Family

## 2024-01-09 DIAGNOSIS — R519 Headache, unspecified: Secondary | ICD-10-CM

## 2024-02-01 ENCOUNTER — Ambulatory Visit: Payer: Self-pay

## 2024-02-01 NOTE — Telephone Encounter (Signed)
  Chief Complaint: Swollen hands and feet Symptoms: above Frequency: end of last week Pertinent Negatives: Patient denies pain, chest pain, SOB Disposition: [] ED /[] Urgent Care (no appt availability in office) / [x] Appointment(In office/virtual)/ []  [facility]/ [] Home Care/ [] Refused Recommended Disposition /[] [facility]/ []  Follow-up with PCP Additional Notes: Pt reports mild hand and feet swelling starting last week. No other s/s. Pt will call back is she develops other s/s. Scheduled for OV 02/03/2024.    Copied from CRM [id number]. Topic: Clinical - Red Word Triage >> Feb 01, 2024  9:34 AM Alethia Huxley E wrote: [facility] that prompted transfer to Nurse Triage: Swelling. Patient noticed towards the end of last week/ over the weekend that her legs, feet and hands started to swell up. Denied having pain. Reason for Disposition  MILD or MODERATE ankle swelling (e.g., can't move joint normally, can't do usual activities) (Exceptions: Itchy, localized swelling; swelling is chronic.)  Answer Assessment - Initial Assessment Questions 1. LOCATION: "Which ankle is swollen?" "Where is the swelling?"     Both hands and feet 2. ONSET: "When did the swelling start?"     End of last week 3. SWELLING: "How bad is the swelling?" Or, "How large is it?" (e.g., mild, moderate, severe; size of localized swelling)    - NONE: No joint swelling.   - LOCALIZED: Localized; small area of puffy or swollen skin (e.g., insect bite, skin irritation).   - MILD: Joint looks or feels mildly swollen or puffy.   - MODERATE: Swollen; interferes with normal activities (e.g., work or school); decreased range of movement; may be limping.   - SEVERE: Very swollen; can't move swollen joint at all; limping a lot or unable to walk.     Mild  4. PAIN: "Is there any pain?" If Yes, ask: "How bad is it?" (Scale 1-10; or mild, moderate, severe)   - NONE (0): no pain.   - MILD (1-3): doesn't interfere with normal  activities.    - MODERATE (4-7): interferes with normal activities (e.g., work or school) or awakens from sleep, limping.    - SEVERE (8-10): excruciating pain, unable to do any normal activities, unable to walk.      no 5. CAUSE: "What do you think caused the ankle swelling?"     unsure 6. OTHER SYMPTOMS: "Do you have any other symptoms?" (e.g., fever, chest pain, difficulty breathing, calf pain)     no  Protocols used: Ankle Swelling-A-AH

## 2024-02-03 ENCOUNTER — Ambulatory Visit

## 2024-02-03 ENCOUNTER — Ambulatory Visit: Admitting: Family

## 2024-02-03 VITALS — BP 140/78 | HR 77 | Temp 98.0°F | Ht 65.5 in | Wt 212.8 lb

## 2024-02-03 DIAGNOSIS — E038 Other specified hypothyroidism: Secondary | ICD-10-CM

## 2024-02-03 DIAGNOSIS — M25472 Effusion, left ankle: Secondary | ICD-10-CM | POA: Diagnosis not present

## 2024-02-03 DIAGNOSIS — M25471 Effusion, right ankle: Secondary | ICD-10-CM | POA: Diagnosis not present

## 2024-02-03 DIAGNOSIS — R7989 Other specified abnormal findings of blood chemistry: Secondary | ICD-10-CM | POA: Insufficient documentation

## 2024-02-03 DIAGNOSIS — R600 Localized edema: Secondary | ICD-10-CM | POA: Diagnosis not present

## 2024-02-03 NOTE — Patient Instructions (Addendum)
--   Recommend wearing compression stocking 10-15 mmHg pressure when you are up and about. You can get this over online or at any drug store. Raising the leg at heart level when you are resting. If swelling persist consider referral to vascular surgery department.   -- We are lab work. We will reach out if lab shows abnormality.   -- I also recommend 30 min of moderate intensity exercise for at least 5 days a week.  -- Focus on drinking water  and please cut down on drinking the Ginger ale to no more than 1/2 bottle if you must.

## 2024-02-03 NOTE — Assessment & Plan Note (Signed)
 Differential diagnosis includes hypothyroidism, lymphedema, venous insufficiency, electrolytes abnormality.  Recommend obtaining TSH, CBC, CMP.   Wear compression stocking 10-15 mmHg pressure when you are up and about. If lab results reassuring can consider referral to vascular surgery. I also recommend 30 min of moderate intensity exercise for at least 5 days a week. Also counseled on drinking water  and d/c high sugar beverages

## 2024-02-03 NOTE — Assessment & Plan Note (Signed)
 Plan per b/l ankle swellings.

## 2024-02-03 NOTE — Assessment & Plan Note (Signed)
 Repeat TSH.

## 2024-02-03 NOTE — Progress Notes (Signed)
   Acute Office Visit  Subjective:     Patient ID: [name], female    DOB: 07/18/1966, 58 y.o.   MRN: [id number]  Chief Complaint  Patient presents with   Joint Swelling   Ear Pain    Patient is in today for following acute concern:  HPI - Swelling of hands and feet:  For about 2 weeks. No pain associated with it. She reports shoes fits more righter for about the same time.  She denies chest pain, wheezing, orthopnea.  She drinks about 1 to 1.5 of ginger ale daily.  Does not exercise regularly.  Has a desk job. No recent fever chills.   Also endorses of intermittent ear pain, chronically.  Has a h/o mildly elevated TSH with normal T3 and T4 from 11/11/2023. Also has a history of elevated AST ALT from 11/11/2023.   ROS As per HPI    Objective:    BP (!) 140/78   Pulse 77   Temp 98 F (36.7 C) (Oral)   Ht 5' 5.5" (1.664 m)   Wt 212 lb 12.8 oz (96.5 kg)   LMP 11/28/2019 (Approximate)   SpO2 98%   BMI 34.87 kg/m    Physical Exam Constitutional:      Appearance: Normal appearance.  HENT:     Head: Normocephalic and atraumatic.     Right Ear: There is impacted cerumen.     Left Ear: Tympanic membrane normal.     Mouth/Throat:     Mouth: Mucous membranes are moist.  Neck:     Thyroid : No thyroid  mass or thyroid  tenderness.  Cardiovascular:     Rate and Rhythm: Normal rate and regular rhythm.  Pulmonary:     Effort: Pulmonary effort is normal.     Breath sounds: Normal breath sounds. No wheezing or rales.  Abdominal:     General: Bowel sounds are normal.     Palpations: Abdomen is soft.  Musculoskeletal:     Cervical back: Neck supple. No rigidity.     Comments: B/L lower extremities: Non-pitting edema of both lower extremities noted, extending from the feet to the mid-calf. No indentation with pressure. Skin is normal looking. No ulceration or tenderness present. No recent trauma or infection reported.  Skin:    General: Skin is warm.  Neurological:      Mental Status: She is alert and oriented to person, place, and time.     No results found for any visits on 02/03/24.     Assessment & Plan:  Swelling of both ankles Assessment & Plan: Differential diagnosis includes hypothyroidism, lymphedema, venous insufficiency, electrolytes abnormality.  Recommend obtaining TSH, CBC, CMP.   Wear compression stocking 10-15 mmHg pressure when you are up and about. If lab results reassuring can consider referral to vascular surgery. I also recommend 30 min of moderate intensity exercise for at least 5 days a week. Also counseled on drinking water  and d/c high sugar beverages    Orders: -     Comprehensive metabolic panel with GFR; Future -     CBC with Differential/Platelet; Future  Subclinical hypothyroidism Assessment & Plan: Repeat TSH  Orders: -     TSH; Future  Edema of both lower legs  - Discussed partially impacted right ear.  Can try OTC ear wax softeners like carbamide peroxide 6.5% BID for no more than 4 days and reevaluate. Return in about 2 weeks (around 02/17/2024) for in 2-3 weeks with PCP .  Jacklin Mascot, MD

## 2024-02-05 ENCOUNTER — Other Ambulatory Visit (INDEPENDENT_AMBULATORY_CARE_PROVIDER_SITE_OTHER)

## 2024-02-05 DIAGNOSIS — E038 Other specified hypothyroidism: Secondary | ICD-10-CM

## 2024-02-05 DIAGNOSIS — R899 Unspecified abnormal finding in specimens from other organs, systems and tissues: Secondary | ICD-10-CM

## 2024-02-05 DIAGNOSIS — M25471 Effusion, right ankle: Secondary | ICD-10-CM

## 2024-02-05 DIAGNOSIS — M25472 Effusion, left ankle: Secondary | ICD-10-CM | POA: Diagnosis not present

## 2024-02-05 NOTE — Addendum Note (Signed)
 Addended by: Lindle Rhea on: 02/05/2024 02:13 PM   Modules accepted: Orders

## 2024-02-06 LAB — CBC WITH DIFFERENTIAL/PLATELET
Absolute Lymphocytes: 1343 {cells}/uL (ref 850–3900)
Absolute Monocytes: 340 {cells}/uL (ref 200–950)
Basophils Absolute: 32 {cells}/uL (ref 0–200)
Basophils Relative: 0.7 %
Eosinophils Absolute: 143 {cells}/uL (ref 15–500)
Eosinophils Relative: 3.1 %
HCT: 33.7 % — ABNORMAL LOW (ref 35.0–45.0)
Hemoglobin: 10.6 g/dL — ABNORMAL LOW (ref 11.7–15.5)
MCH: 29.2 pg (ref 27.0–33.0)
MCHC: 31.5 g/dL — ABNORMAL LOW (ref 32.0–36.0)
MCV: 92.8 fL (ref 80.0–100.0)
MPV: 12.2 fL (ref 7.5–12.5)
Monocytes Relative: 7.4 %
Neutro Abs: 2742 {cells}/uL (ref 1500–7800)
Neutrophils Relative %: 59.6 %
Platelets: 157 10*3/uL (ref 140–400)
RBC: 3.63 10*6/uL — ABNORMAL LOW (ref 3.80–5.10)
RDW: 13.1 % (ref 11.0–15.0)
Total Lymphocyte: 29.2 %
WBC: 4.6 10*3/uL (ref 3.8–10.8)

## 2024-02-06 LAB — COMPREHENSIVE METABOLIC PANEL WITH GFR
AG Ratio: 1.4 (calc) (ref 1.0–2.5)
ALT: 48 U/L — ABNORMAL HIGH (ref 6–29)
AST: 40 U/L — ABNORMAL HIGH (ref 10–35)
Albumin: 3.4 g/dL — ABNORMAL LOW (ref 3.6–5.1)
Alkaline phosphatase (APISO): 79 U/L (ref 37–153)
BUN: 12 mg/dL (ref 7–25)
CO2: 28 mmol/L (ref 20–32)
Calcium: 8.2 mg/dL — ABNORMAL LOW (ref 8.6–10.4)
Chloride: 105 mmol/L (ref 98–110)
Creat: 0.75 mg/dL (ref 0.50–1.03)
Globulin: 2.5 g/dL (ref 1.9–3.7)
Glucose, Bld: 77 mg/dL (ref 65–99)
Potassium: 4.1 mmol/L (ref 3.5–5.3)
Sodium: 140 mmol/L (ref 135–146)
Total Bilirubin: 0.2 mg/dL (ref 0.2–1.2)
Total Protein: 5.9 g/dL — ABNORMAL LOW (ref 6.1–8.1)
eGFR: 93 mL/min/{1.73_m2} (ref >=60)

## 2024-02-06 LAB — HEPATIC FUNCTION PANEL
AG Ratio: 1.4 (calc) (ref 1.0–2.5)
ALT: 48 U/L — ABNORMAL HIGH (ref 6–29)
AST: 40 U/L — ABNORMAL HIGH (ref 10–35)
Albumin: 3.4 g/dL — ABNORMAL LOW (ref 3.6–5.1)
Alkaline phosphatase (APISO): 79 U/L (ref 37–153)
Bilirubin, Direct: 0.1 mg/dL (ref 0.0–0.2)
Globulin: 2.5 g/dL (ref 1.9–3.7)
Indirect Bilirubin: 0.1 mg/dL — ABNORMAL LOW (ref 0.2–1.2)
Total Bilirubin: 0.2 mg/dL (ref 0.2–1.2)
Total Protein: 5.9 g/dL — ABNORMAL LOW (ref 6.1–8.1)

## 2024-02-06 LAB — TSH: TSH: 2.28 m[IU]/L (ref 0.40–4.50)

## 2024-02-09 ENCOUNTER — Ambulatory Visit: Payer: Self-pay

## 2024-02-09 NOTE — Progress Notes (Signed)
 Please let the patient know I reviewed her recent lab results. Her electrolytes looks stable.   Lab shows mildly low serum calcium  this is falsely low due to low albumin. I recommend increasing dietary protein intake. Recommend incorporating lean meats (chicken, [place], and lean cuts of beef or pork), fish/seafood (like salmon, tuna, and shrimp, which are also rich in healthy omega-3 fatty acids), eggs, legumes, beans, nuts etc to improve protein. She has improving liver enzymes called AST, ALT which compared to 3 months ago.   She is mildly anemic which can definitely contribute to her feeling tired/fatigue. I would recommend checking for iron  panel, b12 level to see if she has iron  deficiency, b 12 deficiency. At the meantime if she is not taking a daily multivitamin I would start taking one. Her thyroid  hormone level is normal. She has appointment with her PCP Daivd Dub on 02/25/24. I recommend she keep that appointment as it is. If patient wants I can order B12, iron  panel ordered which she can get it done before appointment with her PCP if not she can discuss this during her office visit and decide on repeat labs.   Thank you,  Jacklin Mascot, MD

## 2024-02-11 ENCOUNTER — Ambulatory Visit: Payer: Self-pay | Admitting: Family

## 2024-02-11 DIAGNOSIS — D649 Anemia, unspecified: Secondary | ICD-10-CM

## 2024-02-15 ENCOUNTER — Telehealth: Payer: Self-pay | Admitting: Family

## 2024-02-15 NOTE — Telephone Encounter (Signed)
 Copied from CRM [id number]. Topic: Clinical - Lab/Test Results >> Feb 15, 2024 12:03 PM [name] wrote: Reason for CRM: patient  wants speak to Dr. Eber Becker, or Dr [name] nurse regarding test results

## 2024-02-16 NOTE — Telephone Encounter (Signed)
 Called Patient she stated to change her 02/25/24 appointment to the swelling due to that is what she saw Dr. Casimir Cleaver for and is still having the issue. I changed the appointment like the Patient requested.

## 2024-02-16 NOTE — Telephone Encounter (Signed)
 Called Patient and went over some of the recent lab results with her and Patient states someone was supposed to call in medication after she had the labs for the swelling in her feet and hands but never did. Patient states swelling is still present in her feet and hands and she needs something for it.

## 2024-02-17 NOTE — Telephone Encounter (Signed)
 noted

## 2024-02-19 ENCOUNTER — Other Ambulatory Visit: Payer: Self-pay

## 2024-02-19 DIAGNOSIS — D649 Anemia, unspecified: Secondary | ICD-10-CM

## 2024-02-22 ENCOUNTER — Other Ambulatory Visit (INDEPENDENT_AMBULATORY_CARE_PROVIDER_SITE_OTHER)

## 2024-02-22 DIAGNOSIS — D649 Anemia, unspecified: Secondary | ICD-10-CM

## 2024-02-22 NOTE — Addendum Note (Signed)
 Addended by: Gradie Lawless A on: 02/22/2024 03:58 PM   Modules accepted: Orders

## 2024-02-23 LAB — HEPATIC FUNCTION PANEL
ALT: 57 IU/L — ABNORMAL HIGH (ref 0–32)
AST: 57 IU/L — ABNORMAL HIGH (ref 0–40)
Albumin: 3.8 g/dL (ref 3.8–4.9)
Alkaline Phosphatase: 92 IU/L (ref 44–121)
Bilirubin Total: 0.2 mg/dL (ref 0.0–1.2)
Bilirubin, Direct: 0.1 mg/dL (ref 0.00–0.40)
Total Protein: 6.1 g/dL (ref 6.0–8.5)

## 2024-02-25 ENCOUNTER — Ambulatory Visit: Admitting: Family

## 2024-02-25 ENCOUNTER — Encounter: Payer: Self-pay | Admitting: Family

## 2024-02-25 VITALS — BP 138/90 | HR 65 | Temp 98.1°F | Ht 65.0 in | Wt 211.8 lb

## 2024-02-25 DIAGNOSIS — R110 Nausea: Secondary | ICD-10-CM

## 2024-02-25 DIAGNOSIS — R3915 Urgency of urination: Secondary | ICD-10-CM | POA: Diagnosis not present

## 2024-02-25 DIAGNOSIS — F418 Other specified anxiety disorders: Secondary | ICD-10-CM | POA: Diagnosis not present

## 2024-02-25 DIAGNOSIS — M25471 Effusion, right ankle: Secondary | ICD-10-CM

## 2024-02-25 DIAGNOSIS — D509 Iron deficiency anemia, unspecified: Secondary | ICD-10-CM

## 2024-02-25 DIAGNOSIS — M25472 Effusion, left ankle: Secondary | ICD-10-CM

## 2024-02-25 DIAGNOSIS — I10 Essential (primary) hypertension: Secondary | ICD-10-CM

## 2024-02-25 DIAGNOSIS — D649 Anemia, unspecified: Secondary | ICD-10-CM

## 2024-02-25 DIAGNOSIS — I159 Secondary hypertension, unspecified: Secondary | ICD-10-CM

## 2024-02-25 DIAGNOSIS — G4700 Insomnia, unspecified: Secondary | ICD-10-CM

## 2024-02-25 DIAGNOSIS — R600 Localized edema: Secondary | ICD-10-CM

## 2024-02-25 DIAGNOSIS — R079 Chest pain, unspecified: Secondary | ICD-10-CM | POA: Insufficient documentation

## 2024-02-25 DIAGNOSIS — R0789 Other chest pain: Secondary | ICD-10-CM

## 2024-02-25 DIAGNOSIS — N3281 Overactive bladder: Secondary | ICD-10-CM

## 2024-02-25 DIAGNOSIS — G8929 Other chronic pain: Secondary | ICD-10-CM

## 2024-02-25 MED ORDER — PREGABALIN 25 MG PO CAPS: 25.0000 mg | ORAL_CAPSULE | Freq: Three times a day (TID) | ORAL | 2 refills | Status: AC

## 2024-02-25 MED ORDER — BUSPIRONE HCL 5 MG PO TABS
5.0000 mg | ORAL_TABLET | Freq: Three times a day (TID) | ORAL | 1 refills | Status: DC
Start: 2024-02-25 — End: 2024-03-31

## 2024-02-25 MED ORDER — ONDANSETRON 4 MG PO TBDP: 4.0000 mg | ORAL_TABLET | Freq: Three times a day (TID) | ORAL | 0 refills | Status: AC | PRN

## 2024-02-25 MED ORDER — TRAZODONE HCL 50 MG PO TABS: ORAL_TABLET | ORAL | 2 refills | Status: AC

## 2024-02-25 MED ORDER — TRAZODONE HCL 100 MG PO TABS: ORAL_TABLET | ORAL | 3 refills | Status: AC

## 2024-02-25 MED ORDER — HYDROCHLOROTHIAZIDE 12.5 MG PO CAPS: 12.5000 mg | ORAL_CAPSULE | Freq: Every day | ORAL | 2 refills | Status: DC

## 2024-02-25 MED ORDER — PREGABALIN 100 MG PO CAPS
100.0000 mg | ORAL_CAPSULE | Freq: Three times a day (TID) | ORAL | 2 refills | Status: DC
Start: 2024-02-25 — End: 2024-07-19

## 2024-02-25 NOTE — Progress Notes (Signed)
 Assessment & Plan:  Atypical chest pain Assessment & Plan: No chest pain today visit. Nonexertional. Limited exercise due to chronic pain.  EKG obtained, sinus bradycardia, nonspecific T wave inversions.  No new T wave inversion when compared to previous 02/12/2017, 06/10/2017, 03/27/2021.  Discussed ASCVD risk.  Pending echo. Referral to cardiology.  Return precautions given  Orders: -     EKG 12-Lead -     DG Chest 2 View; Future -     Ambulatory referral to Cardiology  Anemia, unspecified type Assessment & Plan: Normocytic anemia.  History of bariatric surgery.  Colonoscopy is not up-to-date and reiterated the importance of calling gastroenterology to reschedule to evaluate for occult malignancy.  Patient verbalized understanding.  Pending urine study, ferritin and B12.  Referral to hematology.  Orders: -     Urinalysis w microscopic + reflex cultur -     IBC + Ferritin -     Ambulatory referral to Hematology / Oncology -     B12 and Folate Panel; Future  Depression with anxiety Assessment & Plan: Depression is well-controlled.  Poor control of anxiety.  Continue Effexor  75 mg daily, BuSpar  5 mg 3 times daily, trazodone  125-150mg  at bedtime.  Consider dose escalation of BuSpar , psychiatric referral at follow-up.  Orders: -     traZODone  HCl; Take 1 tablet by mouth everyday at bedtime  Dispense: 90 tablet; Refill: 3 -     busPIRone  HCl; Take 1 tablet (5 mg total) by mouth 3 (three) times daily.  Dispense: 270 tablet; Refill: 1  Insomnia, unspecified type -     traZODone  HCl; Take 1 tablet by mouth everyday at bedtime  Dispense: 90 tablet; Refill: 3 -     traZODone  HCl; TAKE HALF TO 1 TABLET BY MOUTH AT BEDTIME AS NEEDED FOR SLEEP. TAKE WITH TRAZODONE  100MG  AT BEDTIME.  Dispense: 90 tablet; Refill: 2  Other chronic pain Assessment & Plan: Chronic low back pain, suboptimal control. Presentation today c/w with aggravated trochanteric bursitis. Excercirses provided. Continue Lyrica   to 125mg  three times daily. Following with pain management as well, will follow.   Orders: -     DG HIPS BILAT W OR W/O PELVIS 3-4 VIEWS; Future -     Pregabalin ; Take 1 capsule (100 mg total) by mouth 3 (three) times daily. Take with lyrica  25mg  TID.  Dispense: 90 capsule; Refill: 2 -     Pregabalin ; Take 1 capsule (25 mg total) by mouth 3 (three) times daily. Take with lyrica  100mg  TID  Dispense: 90 capsule; Refill: 2  Iron  deficiency anemia, unspecified iron  deficiency anemia type -     Ambulatory referral to Hematology / Oncology  Swelling of both ankles -     ECHOCARDIOGRAM COMPLETE; Future -     Brain natriuretic peptide; Future  Nausea -     Ondansetron ; Take 1 tablet (4 mg total) by mouth every 8 (eight) hours as needed for nausea or vomiting.  Dispense: 12 tablet; Refill: 0  Urinary urgency -     Urine Culture  Secondary hypertension Assessment & Plan: Based on last prior blood pressure readings.  New diagnosis of hypertension.  In the setting of bilateral leg edema, opted to start hydrochlorothiazide   12.5 mg daily.  Orders: -     hydroCHLOROthiazide ; Take 1 capsule (12.5 mg total) by mouth daily.  Dispense: 30 capsule; Refill: 2  Primary hypertension Assessment & Plan: Based on last prior blood pressure readings.  New diagnosis of hypertension.  In the setting  of bilateral leg edema, opted to start hydrochlorothiazide   12.5 mg daily.   OAB (overactive bladder) Assessment & Plan: consistent with overactive bladder.  Discussed anticholinergics and side effects.  Pending urinalysis. We will decided to continue to discuss quality life r/t sxs and potential treatment with medication.   Edema of both lower legs Assessment & Plan: Presentation consistent with venous insufficiency.  No symptoms to suggest fluid volume overload at this time.  Advised compression stockings with zipper.  Start hydrochlorothiazide  12.5 mg as outlined for hypertension.   Other orders -      Urine Culture -     REFLEXIVE URINE CULTURE     Return precautions given.   Risks, benefits, and alternatives of the medications and treatment plan prescribed today were discussed, and patient expressed understanding.   Education regarding symptom management and diagnosis given to patient on AVS either electronically or printed.  Return in about 1 week (around 03/03/2024).  [name], FNP  Subjective:    Patient ID: [name], female    DOB: 15-Oct-1965, 58 y.o.   MRN: [id number]  CC: Sha Burling is a 58 y.o. female who presents today for follow up.   HPI: Complains of BL leg swelling for weeks, unchanged.  Denies orthopnea, sob, intermittent claudication      She also complains of 'sharp , stabbing pain'  center of chest, episodic Last episode over a week ago. No CP today. Occurs at rest, at random when at rest.  Last couple of minutes and then resolves on its own.  No associated syncope, palpitations,  N, jaw pain, diaphoresis No formal exercise.  She does light housework without chest pain  Chronic left arm pain after fracture.   Evaluated 02/03/24  for swelling of ankles. Discussed compression stocking, exercise.  Hgb 10.6 ( 11.3)  Elevated liver enzymes echocardiogram 06/11/2017 left ventricular ejection fraction 60 to 65%.  H/o bariatric surgery She is not taking any vitamins.   Consult GI 09/30/23   H/o gastric ulcer  Colonoscopy 10/26/21  incomplete due to suboptimal prep; she declined another colonoscopy   Complains of urinary urgency.    Denies flank pain, dysuria, stress incontinence.   Right lateral hip pain x  for months.  Pain to sleep on right hip.  Pain with walking  Denies urinary or fecal incontinence, saddle anesthesia.   She request refill of Lyrica .  She requests refill of Zofran  which she uses periodically Allergies: Latex and Penicillins Current Outpatient Medications on File Prior to Visit  Medication Sig  Dispense Refill   Aspirin -Acetaminophen -Caffeine (EXCEDRIN MIGRAINE PO) Take by mouth.     cyanocobalamin  (VITAMIN B12) 1000 MCG/ML injection 1000 MCG (1 ML) INTRAMUSCULAR INJECTION IN THE THIGH ( VASTUS LATERALIS) ONCE PER MONTH. 3 mL 5   cyclobenzaprine (FLEXERIL) 10 MG tablet Take 10 mg by mouth daily as needed.     Estradiol -Norethindrone  Acet 0.5-0.1 MG tablet Take 1 tablet by mouth daily. 84 tablet 3   fentaNYL (DURAGESIC) 12 MCG/HR 1 patch every 3 (three) days.     NEEDLE, DISP, 25 G (EASY TOUCH FLIPLOCK NEEDLES) 25G X 1 MISC Used to give b12 injections monthly 50 each 0   Syringe/Needle, Disp, (SYRINGE 3CC/25GX1) 25G X 1 3 ML MISC Use to give b12 injections monthly 50 each 0   venlafaxine  XR (EFFEXOR -XR) 75 MG 24 hr capsule TAKE 1 CAPSULE BY MOUTH DAILY WITH BREAKFAST. 90 capsule 1   No current facility-administered medications on file prior to visit.  Review of Systems  Constitutional:  Negative for chills and fever.  Respiratory:  Negative for cough and shortness of breath.   Cardiovascular:  Positive for chest pain and leg swelling. Negative for palpitations.  Gastrointestinal:  Negative for nausea and vomiting.  Genitourinary:  Positive for urgency. Negative for difficulty urinating and dysuria.  Musculoskeletal:  Positive for arthralgias.      Objective:    BP (!) 138/90   Pulse 65   Temp 98.1 F (36.7 C) (Oral)   Ht 5' 5 (1.651 m)   Wt 211 lb 12.8 oz (96.1 kg)   LMP 11/28/2019 (Approximate)   SpO2 95%   BMI 35.25 kg/m  BP Readings from Last 3 Encounters:  02/29/24 115/60  02/25/24 (!) 138/90  02/03/24 (!) 140/78   Wt Readings from Last 3 Encounters:  02/29/24 209 lb (94.8 kg)  02/25/24 211 lb 12.8 oz (96.1 kg)  02/03/24 212 lb 12.8 oz (96.5 kg)      02/25/2024    3:41 PM 02/03/2024    4:04 PM 11/19/2023    3:19 PM  Depression screen PHQ 2/9  Decreased Interest 0 3 2  Down, Depressed, Hopeless 0 2 2  PHQ - 2 Score 0 5 4  Altered sleeping 0 3 3   Tired, decreased energy 0 3 3  Change in appetite 0 3 2  Feeling bad or failure about yourself  0 3 2  Trouble concentrating 0 3 2  Moving slowly or fidgety/restless 0 2 0  Suicidal thoughts 0 0 1  PHQ-9 Score 0 22 17  Difficult doing work/chores Not difficult at all Extremely dIfficult Somewhat difficult      02/03/2024    4:04 PM 11/19/2023    3:19 PM 06/22/2023    8:11 AM  GAD 7 : Generalized Anxiety Score  Nervous, Anxious, on Edge 3 1 3   Control/stop worrying 3 2 3   Worry too much - different things 3 2 3   Trouble relaxing 3 2 3   Restless 2 2 2   Easily annoyed or irritable 3 2 2   Afraid - awful might happen 3 2 3   Total GAD 7 Score 20 13 19   Anxiety Difficulty Extremely difficult Somewhat difficult Very difficult     Physical Exam Vitals reviewed.  Constitutional:      Appearance: She is well-developed.   Eyes:     Conjunctiva/sclera: Conjunctivae normal.    Cardiovascular:     Rate and Rhythm: Normal rate and regular rhythm.     Pulses: Normal pulses.     Heart sounds: Normal heart sounds.     Comments: Trace nonpitting bilateral edema. No palpable cords or masses. No erythema or increased warmth. No asymmetry in calf size when compared bilaterally LE hair growth symmetric and present. No discoloration or varicosities noted. LE warm and palpable pedal pulses.  Pulmonary:     Effort: Pulmonary effort is normal.     Breath sounds: Normal breath sounds. No wheezing, rhonchi or rales.  Chest:     Chest wall: No tenderness.     Comments: No reproducible chest wall tenderness  Musculoskeletal:     Lumbar back: No swelling, edema, spasms, tenderness or bony tenderness. Normal range of motion.     Right lower leg: Edema present.     Left lower leg: Edema present.     Comments: Full range of motion with flexion, tension, lateral side bends. No bony tenderness. No pain, numbness, tingling elicited with single leg raise bilaterally.  Right Hip:  No limp or waddling  gait. Full ROM with flexion and hip rotation in flexion.   Pain of lateral hip with  (flexion-abduction-external rotation) test.   Pain with deep palpation of greater trochanter and right SI joint.      Skin:    General: Skin is warm and dry.   Neurological:     Mental Status: She is alert.     Sensory: No sensory deficit.     Deep Tendon Reflexes:     Reflex Scores:      Patellar reflexes are 2+ on the right side and 2+ on the left side.    Comments: Sensation and strength intact bilateral lower extremities.  Psychiatric:        Speech: Speech normal.        Behavior: Behavior normal.        Thought Content: Thought content normal.

## 2024-02-25 NOTE — Patient Instructions (Addendum)
 Start hydrochlorothiazide 12.5mg  which is a blood pressure medication and also diuretic.   Goal of BP < 120/80.  If you start to feel lightheaded, please stop taking hydrochlorothiazide.   Strongly suspect she has venous insufficiency and do not think hydrochlorothiazide will affect change.  Most important thing is exercise and compression.  you may also purchase zip up compression stockings.  please wear Ace wrap as provided during the daytime .  Referral to Cardiology.  I have also ordered an echocardiogram of your heart .  as discussed, if you have recurrence of chest pain, please take the chances and call 911 or report to nearest emergency room   nonspecific Chest Pain Chest pain can be caused by many different conditions. Some causes of chest pain can be life-threatening. These will require treatment right away. Serious causes of chest pain include: Heart attack. A tear in the body's main blood vessel. Redness and swelling (inflammation) around your heart. Blood clot in your lungs. Other causes of chest pain may not be so serious. These include: Heartburn. Anxiety or stress. Damage to bones or muscles in your chest. Lung infections. Chest pain can feel like: Pain or discomfort in your chest. Crushing, pressure, aching, or squeezing pain. Burning or tingling. Dull or sharp pain that is worse when you move, cough, or take a deep breath. Pain or discomfort that is also felt in your back, neck, jaw, shoulder, or arm, or pain that spreads to any of these areas. It is hard to know whether your pain is caused by something that is serious or something that is not so serious. So it is important to see your doctor right away if you have chest pain. Follow these instructions at home: Medicines Take over-the-counter and prescription medicines only as told by your doctor. If you were prescribed an antibiotic medicine, take it as told by your doctor. Do not stop taking the antibiotic even if  you start to feel better. Lifestyle  Rest as told by your doctor. Do not use any products that contain nicotine or tobacco, such as cigarettes, e-cigarettes, and chewing tobacco. If you need help quitting, ask your doctor. Do not drink alcohol. Make lifestyle changes as told by your doctor. These may include: Getting regular exercise. Ask your doctor what activities are safe for you. Eating a heart-healthy diet. A diet and nutrition specialist (dietitian) can help you to learn healthy eating options. Staying at a healthy weight. Treating diabetes or high blood pressure, if needed. Lowering your stress. Activities such as yoga and relaxation techniques can help. General instructions Pay attention to any changes in your symptoms. Tell your doctor about them or any new symptoms. Avoid any activities that cause chest pain. Keep all follow-up visits as told by your doctor. This is important. You may need more testing if your chest pain does not go away. Contact a doctor if: Your chest pain does not go away. You feel depressed. You have a fever. Get help right away if: Your chest pain is worse. You have a cough that gets worse, or you cough up blood. You have very bad (severe) pain in your belly (abdomen). You pass out (faint). You have either of these for no clear reason: Sudden chest discomfort. Sudden discomfort in your arms, back, neck, or jaw. You have shortness of breath at any time. You suddenly start to sweat, or your skin gets clammy. You feel sick to your stomach (nauseous). You throw up (vomit). You suddenly feel lightheaded or dizzy.  You feel very weak or tired. Your heart starts to beat fast, or it feels like it is skipping beats. These symptoms may be an emergency. Do not wait to see if the symptoms will go away. Get medical help right away. Call your local emergency services (911 in the [place] Do not drive yourself to the hospital. Summary Chest pain can be caused by  many different conditions. The cause may be serious and need treatment right away. If you have chest pain, see your doctor right away. Follow your doctor's instructions for taking medicines and making lifestyle changes. Keep all follow-up visits as told by your doctor. This includes visits for any further testing if your chest pain does not go away. Be sure to know the signs that show that your condition has become worse. Get help right away if you have these symptoms. This information is not intended to replace advice given to you by your health care provider. Make sure you discuss any questions you have with your health care provider. Document Revised: 07/17/2022 Document Reviewed: 07/17/2022 [employer] Patient Education  2024 [employer] Sacroiliac Joint Dysfunction: What to Know  Sacroiliac joint dysfunction causes swelling in one or both sides of the sacroiliac (SI) joint. The SI joint is where two bones in the pelvis meet. The two bones are the sacrum, which is the bone at the bottom of your spine, and the ilium, which is the big bone that makes up your hip. SI joint dysfunction can cause a deep ache or burning pain in your lower back. Sometimes, the pain spreads to your butt, hips, or thighs. What are the causes? SI joint dysfunction may be caused by: Pregnancy. When a person is pregnant, extra pressure is put on the SI joints because the pelvis gets wider. Injuries, such as: Car accidents Sports injuries. Work injuries. Having one leg that is shorter than the other. Conditions that affect the joints, such as: Rheumatoid arthritis. Gout. Psoriatic arthritis. Joint infection. Sometimes, the cause of SI joint dysfunction is not known. What are the signs or symptoms? Symptoms of this condition include: Aching or burning pain in the lower back. The pain may also spread to other areas, such as: Butt. Groin. Thighs. Muscle spasms in or around the painful areas. More pain when  standing, walking, running, stair climbing, bending, or lifting. How is this diagnosed? Your health care provider will check your medical history and do a physical exam. During the exam, your provider may move your legs to see if it hurts. They may also do tests like: Imaging tests to look for other causes of pain. These may include: MRI. CT scan. Bone scan. Diagnostic injection. Your provider puts numbing medicine into the SI joint. If your pain gets better after this, it may mean you have this condition. How is this treated? Treatment depends on what is causing the problem and how bad it is. Some options include: Ice or heat applied to the lower back area after an injury. This may help reduce pain and muscle spasms. Medicines to help with pain and swelling, or to relax the muscles. Wearing a back brace, called an SI brace, to support your back while it heals. Physical therapy to help the muscles around the joint be more strong and improve movement. You may also learn how to move your body in ways that are easier on the joint. Direct manipulation. A therapist may gently move the SI joint to help it work better. Using a device that provides electrical  stimulation to help reduce pain at the joint. Other treatments may include: Steroid injections. These can help reduce pain and swelling in the joint. Radiofrequency ablation. This uses heat to burn away nerves that are carrying pain messages from the joint. Surgery to put in screws and plates that limit or prevent joint motion. This is rare. Follow these instructions at home: Medicines Take your medicines only as told. You may need to take these steps to help prevent or treat trouble pooping (constipation): Take medicines to help you poop. Eat foods high in fiber, like beans, whole grains, and fresh fruits and vegetables. Drink more fluids as told. Ask your provider if it's safe to drive or use machines while taking your medicine. If you  have a brace: Wear the brace as told. Take it off only if your provider says you can. Keep the brace clean. If the brace is not waterproof: Do not let it get wet. Managing pain, stiffness, and swelling     Icing can help with pain and swelling. Heat may help with muscle tension or spasms. Ask your provider if you should use ice or heat. Use ice or an ice pack as told. Place a towel between your skin and the ice. Leave the ice on for 20 minutes, 2-3 times a day. Use heat as told. Use the heat source that your provider recommends, such as a moist heat pack or a heating pad. Do this as often as told. Place a towel between your skin and the heat source. Leave the heat on for 20-30 minutes. If your skin turns red, take off the ice or heat right away to prevent skin damage. The risk of damage is higher if you can't feel pain, heat, or cold. General instructions Rest as told. Ask what things are safe for you to do at home. Ask when you can go back to work or school. Exercise as told. Contact a health care provider if: Your pain is not controlled with medicine. You have a fever. Your pain is getting worse. Get help right away if: You have weakness, numbness, or tingling in your legs or feet. You lose control of your bladder or bowels. This information is not intended to replace advice given to you by your health care provider. Make sure you discuss any questions you have with your health care provider. Document Revised: 08/07/2023 Document Reviewed: 08/07/2023 Else[employer] Patient Education  2025 [employer] Iliotibial Band Syndrome Rehab Ask your health care provider which exercises are safe for you. Do exercises exactly as told by your provider and adjust them as told. It's normal to feel mild stretching, pulling, tightness, or discomfort as you do these exercises. Stop right away if you feel sudden pain or your pain gets a lot worse. Do not begin these exercises until told by your  provider. Stretching and range-of-motion exercises These exercises warm up your muscles and joints. They also improve the movement and flexibility of your hip and pelvis. Quadriceps stretch, prone  Lie face down (prone) on a firm surface like a bed or padded floor. Bend your left / right knee. Reach back to hold your ankle or pant leg. If you can't reach your ankle or pant leg, use a belt looped around your foot and grab the belt instead. Gently pull your heel toward your butt. Your knee should not slide out to the side. You should feel a stretch in the front of your thigh and knee, also called the quadriceps. Hold this position for  __________ seconds. Repeat __________ times. Complete this exercise __________ times a day. Iliotibial band stretch The iliotibial band is a strip of tissue that runs along the outside of your hip down to your knee. Lie on your side with your left / right leg on top. Bend both knees and grab your left / right ankle. Stretch out your bottom arm to help you balance. Slowly bring your top knee back so your thigh goes behind your back. Slowly lower your top leg toward the floor until you feel a gentle stretch on the outside of your left / right hip and thigh. If you don't feel a stretch and your knee won't go farther, place the heel of your other foot on top of your knee and pull your knee down toward the floor with your foot. Hold this position for __________ seconds. Repeat __________ times. Complete this exercise __________ times a day. Strengthening exercises These exercises build strength and endurance in your hip and pelvis. Endurance means your muscles can keep working even when they're tired. Straight leg raises, side-lying This exercise strengthens the muscles that rotate the leg at the hip and move it away from your body. These muscles are called hip abductors. Lie on your side with your left / right leg on top. Lie so your head, shoulder, hip, and knee line  up. You can bend your bottom knee to help you balance. Roll your hips slightly forward so they're stacked directly over each other. Your left / right knee should face forward. Tense the muscles in your outer thigh and hip. Lift your top leg 4-6 inches (10-15 cm) off the ground. Hold this position for __________ seconds. Slowly lower your leg back down to the starting position. Let your muscles fully relax before doing this exercise again. Repeat __________ times. Complete this exercise __________ times a day. Leg raises, prone This exercise strengthens the muscles that move the hips backward. These muscles are called hip extensors. Lie face down (prone) on your bed or a firm surface. You can put a pillow under your hips for comfort and to support your lower back. Bend your left / right knee so your foot points straight up toward the ceiling. Keep the other leg straight and behind you. Squeeze your butt muscles. Lift your left / right thigh off the firm surface. Do not let your back arch. Tense your thigh muscle as hard as you can without having more knee pain. Hold this position for __________ seconds. Slowly lower your leg to the starting position. Allow your leg to relax all the way. Repeat __________ times. Complete this exercise __________ times a day. Hip hike  Stand sideways on a bottom step. Place your feet so that your left / right leg is on the step, and the other foot is hanging off the side. If you need support for balance, hold onto a railing or wall. Keep your knees straight and your abdomen square, meaning your hips are level. Then, lift your left / right hip up toward the ceiling. Slowly let your leg that's hanging off the step lower towards the floor. Your foot should get closer to the ground. Do not lean or bend your knees during this movement. Repeat __________ times. Complete this exercise __________ times a day. This information is not intended to replace advice given to  you by your health care provider. Make sure you discuss any questions you have with your health care provider. Document Revised: 11/14/2022 Document Reviewed: 11/14/2022 Else[employer] Patient Education  2024 [employer]Hip Bursitis Rehab Ask your health care provider which exercises are safe for you. Do exercises exactly as told by your health care provider and adjust them as directed. It is normal to feel mild stretching, pulling, tightness, or discomfort as you do these exercises. Stop right away if you feel sudden pain or your pain gets worse. Do not begin these exercises until told by your health care provider. Stretching exercise This exercise warms up your muscles and joints and improves the movement and flexibility of your hip. This exercise also helps to relieve pain and stiffness. Iliotibial band stretch An iliotibial band is a strong band of muscle tissue that runs from the outer side of your hip to the outer side of your thigh and knee. Lie on your side with your left / right leg in the top position. Bend your left / right knee and grab your ankle. Stretch out your bottom arm to help you balance. Slowly bring your knee back so your thigh is slightly behind your body. Slowly lower your knee toward the floor until you feel a gentle stretch on the outside of your left / right thigh. If you do not feel a stretch and your knee will not lower more toward the floor, place the heel of your other foot on top of your knee and pull your knee down toward the floor with your foot. Hold this position for __________ seconds. Slowly return to the starting position. Repeat __________ times. Complete this exercise __________ times a day. Strengthening exercises These exercises build strength and endurance in your hip and pelvis. Endurance is the ability to use your muscles for a long time, even after they get tired. Bridge This exercise strengthens the muscles that move your thigh backward (hip  extensors). Lie on your back on a firm surface with your knees bent and your feet flat on the floor. Tighten your buttocks muscles and lift your buttocks off the floor until your trunk is level with your thighs. Do not arch your back. You should feel the muscles working in your buttocks and the back of your thighs. If you do not feel these muscles, slide your feet 1-2 inches (2.5-5 cm) farther away from your buttocks. If this exercise is too easy, try doing it with your arms crossed over your chest. Hold this position for __________ seconds. Slowly lower your hips to the starting position. Let your muscles relax completely after each repetition. Repeat __________ times. Complete this exercise __________ times a day. Squats This exercise strengthens the muscles in front of your thigh and knee (quadriceps). Stand in front of a table, with your feet and knees pointing straight ahead. You may rest your hands on the table for balance but not for support. Slowly bend your knees and lower your hips like you are going to sit in a chair. Keep your weight over your heels, not over your toes. Keep your lower legs upright so they are parallel with the table legs. Do not let your hips go lower than your knees. Do not bend lower than told by your health care provider. If your hip pain increases, do not bend as low. Hold the squat position for __________ seconds. Slowly push with your legs to return to standing. Do not use your hands to pull yourself to standing. Repeat __________ times. Complete this exercise __________ times a day. Hip hike  Stand sideways on a bottom step. Stand on your left / right leg with your other foot unsupported  next to the step. You can hold on to the railing or wall for balance if needed. Keep your knees straight and your torso square. Then lift your left / right hip up toward the ceiling. Hold this position for __________ seconds. Slowly let your left / right hip lower  toward the floor, past the starting position. Your foot should get closer to the floor. Do not lean or bend your knees. Repeat __________ times. Complete this exercise __________ times a day. Single leg stand This exercise increases your balance. Without shoes, stand near a railing or in a doorway. You may hold on to the railing or door frame as needed for balance. Squeeze your left / right buttock muscles, then lift up your other foot. Do not let your left / right hip push out to the side. It is helpful to stand in front of a mirror for this exercise so you can watch your hip. Hold this position for __________ seconds. Repeat __________ times. Complete this exercise __________ times a day. This information is not intended to replace advice given to you by your health care provider. Make sure you discuss any questions you have with your health care provider. Document Revised: 08/14/2021 Document Reviewed: 08/14/2021 [employer] Patient Education  2024 [employer].

## 2024-02-27 LAB — URINE CULTURE

## 2024-02-28 LAB — URINALYSIS W MICROSCOPIC + REFLEX CULTURE
Bacteria, UA: NONE SEEN /HPF
Bilirubin Urine: NEGATIVE
Glucose, UA: NEGATIVE
Hgb urine dipstick: NEGATIVE
Hyaline Cast: NONE SEEN /LPF
Ketones, ur: NEGATIVE
Nitrites, Initial: NEGATIVE
Protein, ur: NEGATIVE
RBC / HPF: NONE SEEN /HPF (ref 0–2)
Specific Gravity, Urine: 1.022 (ref 1.001–1.035)
Squamous Epithelial / HPF: NONE SEEN /HPF (ref <=5)
pH: 7 (ref 5.0–8.0)

## 2024-02-28 LAB — URINE CULTURE

## 2024-02-28 LAB — CULTURE INDICATED

## 2024-02-28 NOTE — Progress Notes (Unsigned)
 Cardiology Office Note  Date:  02/29/2024   ID:  Allysa Governale, DOB [birth year], MRN [id number]  PCP:  Calista Catching, FNP   Chief Complaint  Patient presents with   Follow-up   Establish Care    New pt has been doing well with has had complaints of chest pain, no chest pressure or SOB, medciation reviewed verbally with patient    HPI:  Ms. Tinna Kolker is a 58 year old woman with past medical history of Bariatric surgery Iron  deficiency anemia Prediabetes Depression/anxiety Chronic pain Ankle swelling Who presents by referral from Bascom Bossier for consultation of her atypical chest pain, leg swelling  complains of 'sharp , stabbing pain'  center of chest, episodic Last episode over a week ago. No CP today. Occurs at rest, random episodes.  Last couple of minutes and then resolves on its own.  No associated syncope, palpitations,  N, jaw pain, diaphoresis No formal exercise.   Last episode of chest discomfort 1 week ago, no radiation  Also reports having ankle swelling, 1 month Moderates fluid intake Some fast food/restaurants  Chronic left arm pain after fracture.   Anemia, HGB 10.6 Reports she is scheduled for iron  infusions  Echo pending, not completed yet  Family hx of heart problems  EKG personally reviewed by myself on todays visit EKG Interpretation Date/Time:  Monday February 29 2024 09:34:31 EDT Ventricular Rate:  63 PR Interval:  150 QRS Duration:  90 QT Interval:  420 QTC Calculation: 429 R Axis:   13  Text Interpretation: Sinus rhythm with frequent Premature ventricular complexes When compared with ECG of 27-Mar-2021 04:53, new PVCs Confirmed by Belva Boyden [phone]) on 02/29/2024 9:50:24 AM     PMH:   has a past medical history of Anemia, Anxiety and depression, Chronic back pain, DM2 (diabetes mellitus, type 2) (HCC), Headache, History of left shoulder fracture, HLD (hyperlipidemia), HTN (hypertension), Hypothyroid, Left leg  weakness, Migraines, Obesity, Pernicious anemia, S/P insertion of spinal cord stimulator, Seizure (HCC), and Wears contact lenses.  PSH:    Past Surgical History:  Procedure Laterality Date   Abd US  - gallstones  5/03   BACK SURGERY  2009   decompressive laminectomy   BARIATRIC SURGERY  03/02/2012   gastric bypass; Dr Therese Flash   CHOLECYSTECTOMY  6/03   COLONOSCOPY WITH PROPOFOL  N/A 05/01/2017   Procedure: COLONOSCOPY WITH PROPOFOL ;  Surgeon: Marnee Sink, MD;  Location: [facility];  Service: Gastroenterology;  Laterality: N/A;   COLONOSCOPY WITH PROPOFOL  N/A 10/27/2023   Procedure: COLONOSCOPY WITH PROPOFOL ;  Surgeon: Luke Salaam, MD;  Location: [facility];  Service: Gastroenterology;  Laterality: N/A;   DILATION AND CURETTAGE OF UTERUS  1/05   ESOPHAGOGASTRODUODENOSCOPY (EGD) WITH PROPOFOL  N/A 05/01/2017   Procedure: ESOPHAGOGASTRODUODENOSCOPY (EGD) WITH PROPOFOL ;  Surgeon: Marnee Sink, MD;  Location: [facility];  Service: Gastroenterology;  Laterality: N/A;   ESOPHAGOGASTRODUODENOSCOPY (EGD) WITH PROPOFOL  N/A 10/27/2023   Procedure: ESOPHAGOGASTRODUODENOSCOPY (EGD) WITH PROPOFOL ;  Surgeon: Luke Salaam, MD;  Location: [facility] ENDOSCOPY;  Service: Gastroenterology;  Laterality: N/A;   KNEE SURGERY     L - arthroscopic   LIVER BIOPSY  6/03   fatty liver   LUMBAR SPINE SURGERY  2015   Dr. Jimmie Moulder   NASAL SINUS SURGERY     PANNICULECTOMY  08/2013   [facility], Dr. Ellan Gunner   SHOULDER ARTHROSCOPY Left 10/06/2019   sleep study - apnea  04/2001   improved since gastric bypass    Current Outpatient Medications  Medication Sig Dispense Refill  Aspirin -Acetaminophen -Caffeine (EXCEDRIN MIGRAINE PO) Take by mouth.     busPIRone  (BUSPAR ) 5 MG tablet Take 1 tablet (5 mg total) by mouth 3 (three) times daily. 270 tablet 1   cyanocobalamin  (VITAMIN B12) 1000 MCG/ML injection 1000 MCG (1 ML) INTRAMUSCULAR INJECTION IN THE THIGH ( VASTUS LATERALIS) ONCE PER MONTH. 3 mL 5   cyclobenzaprine  (FLEXERIL) 10 MG tablet Take 10 mg by mouth daily as needed.     Estradiol -Norethindrone  Acet 0.5-0.1 MG tablet Take 1 tablet by mouth daily. 84 tablet 3   fentaNYL (DURAGESIC) 12 MCG/HR 1 patch every 3 (three) days.     hydrochlorothiazide  (MICROZIDE ) 12.5 MG capsule Take 1 capsule (12.5 mg total) by mouth daily. 30 capsule 2   NEEDLE, DISP, 25 G (EASY TOUCH FLIPLOCK NEEDLES) 25G X 1 MISC Used to give b12 injections monthly 50 each 0   ondansetron  (ZOFRAN -ODT) 4 MG disintegrating tablet Take 1 tablet (4 mg total) by mouth every 8 (eight) hours as needed for nausea or vomiting. 12 tablet 0   pregabalin  (LYRICA ) 100 MG capsule Take 1 capsule (100 mg total) by mouth 3 (three) times daily. Take with lyrica  25mg  TID. 90 capsule 2   pregabalin  (LYRICA ) 25 MG capsule Take 1 capsule (25 mg total) by mouth 3 (three) times daily. Take with lyrica  100mg  TID 90 capsule 2   Syringe/Needle, Disp, (SYRINGE 3CC/25GX1) 25G X 1 3 ML MISC Use to give b12 injections monthly 50 each 0   traZODone  (DESYREL ) 100 MG tablet Take 1 tablet by mouth everyday at bedtime 90 tablet 3   traZODone  (DESYREL ) 50 MG tablet TAKE HALF TO 1 TABLET BY MOUTH AT BEDTIME AS NEEDED FOR SLEEP. TAKE WITH TRAZODONE  100MG  AT BEDTIME. 90 tablet 2   venlafaxine  XR (EFFEXOR -XR) 75 MG 24 hr capsule TAKE 1 CAPSULE BY MOUTH DAILY WITH BREAKFAST. 90 capsule 1   No current facility-administered medications for this visit.    Allergies:   Latex and Penicillins   Social History:  The patient  reports that she has never smoked. She has never used smokeless tobacco. She reports that she does not drink alcohol and does not use drugs.   Family History:   family history includes Cancer in her mother; Colon cancer in her maternal aunt; Diabetes in her mother and sister; Fibromyalgia in her sister; Heart failure in her mother; Lung cancer in her father and mother; Skin cancer in her paternal aunt and paternal uncle.    Review of Systems: Review of  Systems  Constitutional: Negative.   HENT: Negative.    Respiratory: Negative.    Cardiovascular: Negative.   Gastrointestinal: Negative.   Musculoskeletal: Negative.   Neurological: Negative.   Psychiatric/Behavioral: Negative.    All other systems reviewed and are negative.    PHYSICAL EXAM: VS:  BP 115/60 (BP Location: Left Arm, Patient Position: Sitting, Cuff Size: Normal)   Pulse 63   Ht 5' 5 (1.651 m)   Wt 209 lb (94.8 kg)   LMP 11/28/2019 (Approximate)   SpO2 98%   BMI 34.78 kg/m  , BMI Body mass index is 34.78 kg/m. GEN: Well nourished, well developed, in no acute distress HEENT: normal Neck: no JVD, carotid bruits, or masses Cardiac: RRR; no murmurs, rubs, or gallops,no edema  Respiratory:  clear to auscultation bilaterally, normal work of breathing GI: soft, nontender, nondistended, + BS MS: no deformity or atrophy Skin: warm and dry, no rash Neuro:  Strength and sensation are intact Psych: euthymic mood, full affect  Recent Labs: 02/05/2024: BUN 12; Creat 0.75; Hemoglobin 10.6; Platelets 157; Potassium 4.1; Sodium 140; TSH 2.28 02/22/2024: ALT 57    Lipid Panel Lab Results  Component Value Date   CHOL 93 06/22/2023   HDL 43.40 06/22/2023   LDLCALC 42 06/22/2023   TRIG 37.0 06/22/2023      Wt Readings from Last 3 Encounters:  02/29/24 209 lb (94.8 kg)  02/25/24 211 lb 12.8 oz (96.1 kg)  02/03/24 212 lb 12.8 oz (96.5 kg)     ASSESSMENT AND PLAN:  Problem List Items Addressed This Visit       Cardiology Problems   HYPERCHOLESTEROLEMIA, PURE     Other   Chest pain of uncertain etiology - Primary   Relevant Orders   EKG 12-Lead (Completed)   Edema of both lower legs   Chronic pain   Prediabetes   Anemia   Atypical chest pain Most likely musculoskeletal in etiology Quick in nature, presenting at rest, less likely angina Echocardiogram pending  Leg swelling Worse over the past month In the setting of anemia hemoglobin 10.6 which  could be exacerbating her leg swelling Has not noticed much improvement with HCTZ 12.5 daily Recommend she take Lasix 20 mg 3 days a week, continue HCTZ daily Suspect her leg swelling may improve with better hemoglobin Echocardiogram pending to evaluate right heart pressures Avoid calcium  channel blockers  PVCs Noted on EKG today, asymptomatic Echocardiogram pending to rule out structural heart disease  Patient seen in consultation for Bascom Bossier and will be referred back to her office for ongoing care of the issues detailed above  Signed, Juanda Noon, M.D., Ph.D. [facility] [place], [place] [phone]

## 2024-02-29 ENCOUNTER — Ambulatory Visit: Payer: Self-pay | Admitting: Family

## 2024-02-29 ENCOUNTER — Ambulatory Visit: Attending: Cardiovascular Disease | Admitting: Cardiovascular Disease

## 2024-02-29 ENCOUNTER — Encounter: Payer: Self-pay | Admitting: Cardiovascular Disease

## 2024-02-29 VITALS — BP 115/60 | HR 63 | Ht 65.0 in | Wt 209.0 lb

## 2024-02-29 DIAGNOSIS — R079 Chest pain, unspecified: Secondary | ICD-10-CM

## 2024-02-29 DIAGNOSIS — D509 Iron deficiency anemia, unspecified: Secondary | ICD-10-CM

## 2024-02-29 DIAGNOSIS — R600 Localized edema: Secondary | ICD-10-CM

## 2024-02-29 DIAGNOSIS — G8929 Other chronic pain: Secondary | ICD-10-CM | POA: Diagnosis not present

## 2024-02-29 DIAGNOSIS — E7800 Pure hypercholesterolemia, unspecified: Secondary | ICD-10-CM

## 2024-02-29 DIAGNOSIS — I493 Ventricular premature depolarization: Secondary | ICD-10-CM

## 2024-02-29 DIAGNOSIS — R7303 Prediabetes: Secondary | ICD-10-CM

## 2024-02-29 DIAGNOSIS — M7989 Other specified soft tissue disorders: Secondary | ICD-10-CM

## 2024-02-29 MED ORDER — POTASSIUM CHLORIDE 20 MEQ PO PACK: 20.0000 meq | PACK | ORAL | 3 refills | Status: DC

## 2024-02-29 MED ORDER — FUROSEMIDE 20 MG PO TABS: 20.0000 mg | ORAL_TABLET | ORAL | 3 refills | Status: AC

## 2024-02-29 NOTE — Patient Instructions (Addendum)
 Medication Instructions:   Lasix/furosemide 20 mg daily 3x a week to start  Take with potassium powder 20 meq when you take lasix  If you need a refill on your cardiac medications before your next appointment, please call your pharmacy.   Lab work: No new labs needed  Testing/Procedures: No new testing needed  Follow-Up: At [facility], you and your health needs are our priority.  As part of our continuing mission to provide you with exceptional heart care, we have created designated Provider Care Teams.  These Care Teams include your primary Cardiologist (physician) and Advanced Practice Providers (APPs -  Physician Assistants and Nurse Practitioners) who all work together to provide you with the care you need, when you need it.  You will need a follow up appointment in 6 months  Providers on your designated Care Team:   Cheryl Pintos, NP Cheryl Gentleman, PA-C [name], [place]  COVID-19 Vaccine Information can be found at: [email or web address] For questions related to vaccine distribution or appointments, please email [email or web address] or call [phone].

## 2024-03-02 NOTE — Assessment & Plan Note (Signed)
 No chest pain today visit. EKG obtained, sinus bradycardia, nonspecific T wave inversions.  No new T wave inversion when compared to previous 02/12/2017, 06/10/2017, 03/27/2021.  Discussed ASCVD risk.  Referral to cardiology.  Return precautions given

## 2024-03-03 DIAGNOSIS — N3281 Overactive bladder: Secondary | ICD-10-CM | POA: Insufficient documentation

## 2024-03-03 NOTE — Assessment & Plan Note (Addendum)
 Depression is well-controlled.  Poor control of anxiety.  Continue Effexor  75 mg daily, BuSpar  5 mg 3 times daily, trazodone  125-150mg  at bedtime.  Consider dose escalation of BuSpar , psychiatric referral at follow-up.

## 2024-03-03 NOTE — Assessment & Plan Note (Addendum)
 Chronic low back pain, suboptimal control. Presentation today c/w with aggravated trochanteric bursitis. Excercirses provided. Continue Lyrica  to 125mg  three times daily. Following with pain management as well, will follow.

## 2024-03-03 NOTE — Assessment & Plan Note (Signed)
 consistent with overactive bladder.  Discussed anticholinergics and side effects.  Pending urinalysis. We will decided to continue to discuss quality life r/t sxs and potential treatment with medication.

## 2024-03-03 NOTE — Assessment & Plan Note (Addendum)
 Normocytic anemia.  History of bariatric surgery.  Colonoscopy is not up-to-date and reiterated the importance of calling gastroenterology to reschedule to evaluate for occult malignancy.  Patient verbalized understanding.  Pending urine study, ferritin and B12.  Referral to hematology.

## 2024-03-03 NOTE — Assessment & Plan Note (Signed)
 Based on last prior blood pressure readings.  New diagnosis of hypertension.  In the setting of bilateral leg edema, opted to start hydrochlorothiazide   12.5 mg daily.

## 2024-03-03 NOTE — Assessment & Plan Note (Signed)
 Presentation consistent with venous insufficiency.  No symptoms to suggest fluid volume overload at this time.  Advised compression stockings with zipper.  Start hydrochlorothiazide  12.5 mg as outlined for hypertension.

## 2024-03-07 ENCOUNTER — Other Ambulatory Visit: Payer: Self-pay | Admitting: Family

## 2024-03-07 DIAGNOSIS — K760 Fatty (change of) liver, not elsewhere classified: Secondary | ICD-10-CM

## 2024-03-11 ENCOUNTER — Telehealth: Payer: Self-pay

## 2024-03-11 NOTE — Telephone Encounter (Signed)
 Copied from CRM [id number]. Topic: General - Other >> Mar 11, 2024  2:20 PM Franky GRADE wrote: Reason for CRM: [facility] is calling due to patient being scheduled for an Echo on Monday 03/14/2024. They need a prior auth if not patient's appointment would need to be rescheduled. Best call back number is [phone].

## 2024-03-14 ENCOUNTER — Ambulatory Visit: Attending: Family

## 2024-03-15 ENCOUNTER — Ambulatory Visit (INDEPENDENT_AMBULATORY_CARE_PROVIDER_SITE_OTHER)

## 2024-03-15 ENCOUNTER — Other Ambulatory Visit (INDEPENDENT_AMBULATORY_CARE_PROVIDER_SITE_OTHER)

## 2024-03-15 DIAGNOSIS — D649 Anemia, unspecified: Secondary | ICD-10-CM | POA: Diagnosis not present

## 2024-03-15 DIAGNOSIS — M25472 Effusion, left ankle: Secondary | ICD-10-CM

## 2024-03-15 DIAGNOSIS — M25471 Effusion, right ankle: Secondary | ICD-10-CM

## 2024-03-15 DIAGNOSIS — R0789 Other chest pain: Secondary | ICD-10-CM | POA: Diagnosis not present

## 2024-03-15 DIAGNOSIS — I159 Secondary hypertension, unspecified: Secondary | ICD-10-CM

## 2024-03-15 DIAGNOSIS — R2240 Localized swelling, mass and lump, unspecified lower limb: Secondary | ICD-10-CM | POA: Diagnosis not present

## 2024-03-15 NOTE — Addendum Note (Signed)
 Addended by: TANDA HARVEY D on: 03/15/2024 04:00 PM   Modules accepted: Orders

## 2024-03-16 LAB — BASIC METABOLIC PANEL WITH GFR
BUN: 29 mg/dL — ABNORMAL HIGH (ref 6–23)
CO2: 29 meq/L (ref 19–32)
Calcium: 8.7 mg/dL (ref 8.4–10.5)
Chloride: 102 meq/L (ref 96–112)
Creatinine, Ser: 0.93 mg/dL (ref 0.40–1.20)
GFR: 68.07 mL/min (ref >60.00)
Glucose, Bld: 101 mg/dL — ABNORMAL HIGH (ref 70–99)
Potassium: 3.8 meq/L (ref 3.5–5.1)
Sodium: 139 meq/L (ref 135–145)

## 2024-03-16 LAB — IRON,TIBC AND FERRITIN PANEL
%SAT: 9 % — ABNORMAL LOW (ref 16–45)
Ferritin: 8 ng/mL — ABNORMAL LOW (ref 16–232)
Iron: 42 ug/dL — ABNORMAL LOW (ref 45–160)
TIBC: 484 ug/dL — ABNORMAL HIGH (ref 250–450)

## 2024-03-16 LAB — B12 AND FOLATE PANEL
Folate: 9.4 ng/mL (ref >5.9)
Vitamin B-12: 285 pg/mL (ref 211–911)

## 2024-03-16 LAB — BRAIN NATRIURETIC PEPTIDE: Brain Natriuretic Peptide: 6 pg/mL (ref <100)

## 2024-03-17 DIAGNOSIS — M51372 Other intervertebral disc degeneration, lumbosacral region with discogenic back pain and lower extremity pain: Secondary | ICD-10-CM | POA: Diagnosis not present

## 2024-03-17 DIAGNOSIS — M533 Sacrococcygeal disorders, not elsewhere classified: Secondary | ICD-10-CM | POA: Diagnosis not present

## 2024-03-17 DIAGNOSIS — M5126 Other intervertebral disc displacement, lumbar region: Secondary | ICD-10-CM | POA: Diagnosis not present

## 2024-03-17 DIAGNOSIS — G8929 Other chronic pain: Secondary | ICD-10-CM | POA: Diagnosis not present

## 2024-03-20 ENCOUNTER — Other Ambulatory Visit: Payer: Self-pay | Admitting: Family

## 2024-03-20 DIAGNOSIS — I159 Secondary hypertension, unspecified: Secondary | ICD-10-CM

## 2024-03-21 DIAGNOSIS — M75102 Unspecified rotator cuff tear or rupture of left shoulder, not specified as traumatic: Secondary | ICD-10-CM | POA: Diagnosis not present

## 2024-03-28 ENCOUNTER — Ambulatory Visit: Attending: Family

## 2024-03-28 DIAGNOSIS — M25472 Effusion, left ankle: Secondary | ICD-10-CM

## 2024-03-28 DIAGNOSIS — M25471 Effusion, right ankle: Secondary | ICD-10-CM

## 2024-03-28 DIAGNOSIS — R600 Localized edema: Secondary | ICD-10-CM

## 2024-03-28 LAB — ECHOCARDIOGRAM COMPLETE
AR max vel: 1.76 cm2
AV Area VTI: 1.8 cm2
AV Area mean vel: 1.76 cm2
AV Mean grad: 7 mmHg
AV Peak grad: 13.2 mmHg
Ao pk vel: 1.82 m/s
Area-P 1/2: 3.6 cm2
S' Lateral: 2.59 cm

## 2024-03-30 ENCOUNTER — Other Ambulatory Visit

## 2024-03-30 ENCOUNTER — Ambulatory Visit: Admitting: Oncology

## 2024-03-31 ENCOUNTER — Encounter: Payer: Self-pay | Admitting: Family

## 2024-03-31 ENCOUNTER — Other Ambulatory Visit: Payer: Self-pay | Admitting: Family Medicine

## 2024-03-31 ENCOUNTER — Ambulatory Visit
Admission: RE | Admit: 2024-03-31 | Discharge: 2024-03-31 | Disposition: A | Discharge status: Home / Self Care | Source: Ambulatory Visit | Attending: Physician Assistant | Admitting: Physician Assistant

## 2024-03-31 ENCOUNTER — Ambulatory Visit: Admitting: Family

## 2024-03-31 VITALS — BP 136/78 | HR 78 | Temp 98.2°F | Ht 65.0 in | Wt 219.6 lb

## 2024-03-31 DIAGNOSIS — Z049 Encounter for examination and observation for unspecified reason: Secondary | ICD-10-CM

## 2024-03-31 DIAGNOSIS — F418 Other specified anxiety disorders: Secondary | ICD-10-CM | POA: Diagnosis not present

## 2024-03-31 DIAGNOSIS — E538 Deficiency of other specified B group vitamins: Secondary | ICD-10-CM

## 2024-03-31 MED ORDER — BUSPIRONE HCL 5 MG PO TABS: ORAL_TABLET | ORAL | 1 refills | Status: DC

## 2024-03-31 NOTE — Patient Instructions (Addendum)
 [email or web address]   Increase BuSpar  to 10 mg in the morning, continue 5 mg midday and 5 mg in the afternoon.  Please let me know how you are doing

## 2024-03-31 NOTE — Progress Notes (Signed)
 Assessment & Plan:  B12 deficiency Assessment & Plan: Chronic, suboptimal.  Advised B12 injection at home weekly x 4 weeks and then to resume monthly.   Depression with anxiety Assessment & Plan: Chronic, suboptimal control.  Discussed grief over the loss of loved ones.  Advised to look into grief [email or web address]. Continue Effexor  75 mg daily,  trazodone  125-150mg  at bedtime.Increase BuSpar  to 10 mg in the morning, continue 5 mg midday and 5 mg in the afternoon.  Close follow-up  Orders: -     busPIRone  HCl; Take 10mg  qam, 5mg  midday and 5mg  in the afternoon.  Dispense: 360 tablet; Refill: 1     Return precautions given.   Risks, benefits, and alternatives of the medications and treatment plan prescribed today were discussed, and patient expressed understanding.   Education regarding symptom management and diagnosis given to patient on AVS either electronically or printed.  Return in about 3 months (around 07/01/2024).  [name], FNP  Subjective:    Patient ID: [name], female    DOB: [birth year], 58 y.o.   MRN: [id number]  CC: Sumire Halbleib is a 58 y.o. female who presents today for follow up.   HPI: She describes continued anxiety. She can tell buspar  has been helpful and she feels less irritable.  71 month old nephew Rankin whom had brought her so much joy.   She continues to have grief over loss of mom , sister,       She is not taking bariatric vitamins. She takes b12 injections when she rememebers.   Compliant with Effexor  75 mg daily, BuSpar  5 mg 3 times daily, trazodone  125-150mg  at bedtime.   Referral to hematology , pending     Cardiology consult 02/25/24 She is compliant with lasix  20mg  three days per week.  Allergies: Latex and Penicillins Current Outpatient Medications on File Prior to Visit  Medication Sig Dispense Refill   Aspirin -Acetaminophen -Caffeine (EXCEDRIN MIGRAINE PO) Take by mouth.     cyanocobalamin  (VITAMIN B12) 1000  MCG/ML injection 1000 MCG (1 ML) INTRAMUSCULAR INJECTION IN THE THIGH ( VASTUS LATERALIS) ONCE PER MONTH. 3 mL 5   cyclobenzaprine (FLEXERIL) 10 MG tablet Take 10 mg by mouth daily as needed.     Estradiol -Norethindrone  Acet 0.5-0.1 MG tablet Take 1 tablet by mouth daily. 84 tablet 3   fentaNYL (DURAGESIC) 12 MCG/HR 1 patch every 3 (three) days.     furosemide  (LASIX ) 20 MG tablet Take 1 tablet (20 mg total) by mouth 3 (three) times a week. 36 tablet 3   hydrochlorothiazide  (MICROZIDE ) 12.5 MG capsule Take 1 capsule (12.5 mg total) by mouth daily. 30 capsule 2   NEEDLE, DISP, 25 G (EASY TOUCH FLIPLOCK NEEDLES) 25G X 1 MISC Used to give b12 injections monthly 50 each 0   ondansetron  (ZOFRAN -ODT) 4 MG disintegrating tablet Take 1 tablet (4 mg total) by mouth every 8 (eight) hours as needed for nausea or vomiting. 12 tablet 0   potassium chloride  (KLOR-CON ) 20 MEQ packet Take 20 mEq by mouth 3 (three) times a week. 36 packet 3   pregabalin  (LYRICA ) 100 MG capsule Take 1 capsule (100 mg total) by mouth 3 (three) times daily. Take with lyrica  25mg  TID. 90 capsule 2   pregabalin  (LYRICA ) 25 MG capsule Take 1 capsule (25 mg total) by mouth 3 (three) times daily. Take with lyrica  100mg  TID 90 capsule 2   Syringe/Needle, Disp, (SYRINGE 3CC/25GX1) 25G X 1 3 ML MISC Use to give b12 injections monthly 50  each 0   traZODone  (DESYREL ) 100 MG tablet Take 1 tablet by mouth everyday at bedtime 90 tablet 3   traZODone  (DESYREL ) 50 MG tablet TAKE HALF TO 1 TABLET BY MOUTH AT BEDTIME AS NEEDED FOR SLEEP. TAKE WITH TRAZODONE  100MG  AT BEDTIME. 90 tablet 2   venlafaxine  XR (EFFEXOR -XR) 75 MG 24 hr capsule TAKE 1 CAPSULE BY MOUTH DAILY WITH BREAKFAST. 90 capsule 1   No current facility-administered medications on file prior to visit.    Review of Systems  Constitutional:  Negative for chills and fever.  Respiratory:  Negative for cough.   Cardiovascular:  Negative for chest pain and palpitations.   Gastrointestinal:  Negative for nausea and vomiting.  Psychiatric/Behavioral:  Negative for sleep disturbance. The patient is nervous/anxious.       Objective:    BP 136/78   Pulse 78   Temp 98.2 F (36.8 C) (Oral)   Ht 5' 5 (1.651 m)   Wt 219 lb 9.6 oz (99.6 kg)   LMP 11/28/2019 (Approximate)   SpO2 96%   BMI 36.54 kg/m  BP Readings from Last 3 Encounters:  03/31/24 136/78  02/29/24 115/60  02/25/24 (!) 138/90   Wt Readings from Last 3 Encounters:  03/31/24 219 lb 9.6 oz (99.6 kg)  02/29/24 209 lb (94.8 kg)  02/25/24 211 lb 12.8 oz (96.1 kg)      03/31/2024    4:18 PM 02/25/2024    3:41 PM 02/03/2024    4:04 PM  Depression screen PHQ 2/9  Decreased Interest 2 0 3  Down, Depressed, Hopeless 2 0 2  PHQ - 2 Score 4 0 5  Altered sleeping 3 0 3  Tired, decreased energy 3 0 3  Change in appetite 3 0 3  Feeling bad or failure about yourself  3 0 3  Trouble concentrating 2 0 3  Moving slowly or fidgety/restless 2 0 2  Suicidal thoughts 0 0 0  PHQ-9 Score 20 0 22  Difficult doing work/chores Not difficult at all Not difficult at all Extremely dIfficult      03/31/2024    4:19 PM 02/03/2024    4:04 PM 11/19/2023    3:19 PM 06/22/2023    8:11 AM  GAD 7 : Generalized Anxiety Score  Nervous, Anxious, on Edge 2 3 1 3   Control/stop worrying 3 3 2 3   Worry too much - different things 3 3 2 3   Trouble relaxing 3 3 2 3   Restless 2 2 2 2   Easily annoyed or irritable 2 3 2 2   Afraid - awful might happen 2 3 2 3   Total GAD 7 Score 17 20 13 19   Anxiety Difficulty Not difficult at all Extremely difficult Somewhat difficult Very difficult      Physical Exam Vitals reviewed.  Constitutional:      Appearance: She is well-developed.  Eyes:     Conjunctiva/sclera: Conjunctivae normal.  Cardiovascular:     Rate and Rhythm: Normal rate and regular rhythm.     Pulses: Normal pulses.     Heart sounds: Normal heart sounds.  Pulmonary:     Effort: Pulmonary effort is normal.      Breath sounds: Normal breath sounds. No wheezing, rhonchi or rales.  Skin:    General: Skin is warm and dry.  Neurological:     Mental Status: She is alert.  Psychiatric:        Speech: Speech normal.        Behavior: Behavior normal.  Thought Content: Thought content normal.

## 2024-04-01 NOTE — Progress Notes (Deleted)
 Referring Physician:  Kemp Coke [address] [place],  [place] [address]  Primary Physician:  Dineen Rollene MATSU, FNP  History of Present Illness: 04/01/2024 [name] is here today with a chief complaint of ***  Back pain Leg pain  Duration: *** Location: *** Quality: *** Severity: ***  Precipitating: aggravated by *** Modifying factors: made better by *** Weakness: none Timing: *** Bowel/Bladder Dysfunction: none  Conservative measures:  Physical therapy: *** has participated in?? Multimodal medical therapy including regular antiinflammatories: *** oxycodone , lyrica , fentanyl patch, flexeril, gabapentin ,  Injections: *** multiple injections at [facility] March 2024: Right L5-S1 ESI  Past Surgery: *** Back Surgery in 2009 and 2015? [facility]   [name] has ***no symptoms of cervical myelopathy.  The symptoms are causing a significant impact on the patient's life.   Review of Systems:  A 10 point review of systems is negative, except for the pertinent positives and negatives detailed in the HPI.  Past Medical History: Past Medical History:  Diagnosis Date   Anemia    Anxiety and depression    Chronic back pain    DM2 (diabetes mellitus, type 2) (HCC)    improved since gastric bypass   Headache    stress/sinus - 2-3x/wk   History of left shoulder fracture    HLD (hyperlipidemia)    HTN (hypertension)    improved since gastric bypass   Hypothyroid    Left leg weakness    s/p back surgery   Migraines    Obesity    Pernicious anemia    S/P insertion of spinal cord stimulator    Seizure (HCC)    told she had a seizure as young child, not sure of context; mother states she didnt witness   Wears contact lenses     Past Surgical History: Past Surgical History:  Procedure Laterality Date   Abd US  - gallstones  5/03   BACK SURGERY  2009   decompressive laminectomy   BARIATRIC SURGERY  03/02/2012   gastric bypass; Dr  Wolm   CHOLECYSTECTOMY  6/03   COLONOSCOPY WITH PROPOFOL  N/A 05/01/2017   Procedure: COLONOSCOPY WITH PROPOFOL ;  Surgeon: Jinny Carmine, MD;  Location: [facility];  Service: Gastroenterology;  Laterality: N/A;   COLONOSCOPY WITH PROPOFOL  N/A 10/27/2023   Procedure: COLONOSCOPY WITH PROPOFOL ;  Surgeon: Therisa Bi, MD;  Location: [facility];  Service: Gastroenterology;  Laterality: N/A;   DILATION AND CURETTAGE OF UTERUS  1/05   ESOPHAGOGASTRODUODENOSCOPY (EGD) WITH PROPOFOL  N/A 05/01/2017   Procedure: ESOPHAGOGASTRODUODENOSCOPY (EGD) WITH PROPOFOL ;  Surgeon: Jinny Carmine, MD;  Location: [facility];  Service: Gastroenterology;  Laterality: N/A;   ESOPHAGOGASTRODUODENOSCOPY (EGD) WITH PROPOFOL  N/A 10/27/2023   Procedure: ESOPHAGOGASTRODUODENOSCOPY (EGD) WITH PROPOFOL ;  Surgeon: Therisa Bi, MD;  Location: [facility];  Service: Gastroenterology;  Laterality: N/A;   KNEE SURGERY     L - arthroscopic   LIVER BIOPSY  6/03   fatty liver   LUMBAR SPINE SURGERY  2015   Dr. Watt   NASAL SINUS SURGERY     PANNICULECTOMY  08/2013   [facility], Dr. Timm   SHOULDER ARTHROSCOPY Left 10/06/2019   sleep study - apnea  04/2001   improved since gastric bypass    Allergies: Allergies as of 04/06/2024 - Review Complete 03/31/2024  Allergen Reaction Noted   Latex Rash 03/31/2018   Penicillins Itching and Swelling 12/28/2007    Medications: Outpatient Encounter Medications as of 04/06/2024  Medication Sig   Aspirin -Acetaminophen -Caffeine (EXCEDRIN MIGRAINE  PO) Take by mouth.   busPIRone  (BUSPAR ) 5 MG tablet Take 10mg  qam, 5mg  midday and 5mg  in the afternoon.   cyanocobalamin  (VITAMIN B12) 1000 MCG/ML injection 1000 MCG (1 ML) INTRAMUSCULAR INJECTION IN THE THIGH ( VASTUS LATERALIS) ONCE PER MONTH.   cyclobenzaprine (FLEXERIL) 10 MG tablet Take 10 mg by mouth daily as needed.   Estradiol -Norethindrone  Acet 0.5-0.1 MG tablet Take 1 tablet by mouth daily.   fentaNYL (DURAGESIC) 12  MCG/HR 1 patch every 3 (three) days.   furosemide  (LASIX ) 20 MG tablet Take 1 tablet (20 mg total) by mouth 3 (three) times a week.   hydrochlorothiazide  (MICROZIDE ) 12.5 MG capsule Take 1 capsule (12.5 mg total) by mouth daily.   NEEDLE, DISP, 25 G (EASY TOUCH FLIPLOCK NEEDLES) 25G X 1 MISC Used to give b12 injections monthly   ondansetron  (ZOFRAN -ODT) 4 MG disintegrating tablet Take 1 tablet (4 mg total) by mouth every 8 (eight) hours as needed for nausea or vomiting.   potassium chloride  (KLOR-CON ) 20 MEQ packet Take 20 mEq by mouth 3 (three) times a week.   pregabalin  (LYRICA ) 100 MG capsule Take 1 capsule (100 mg total) by mouth 3 (three) times daily. Take with lyrica  25mg  TID.   pregabalin  (LYRICA ) 25 MG capsule Take 1 capsule (25 mg total) by mouth 3 (three) times daily. Take with lyrica  100mg  TID   Syringe/Needle, Disp, (SYRINGE 3CC/25GX1) 25G X 1 3 ML MISC Use to give b12 injections monthly   traZODone  (DESYREL ) 100 MG tablet Take 1 tablet by mouth everyday at bedtime   traZODone  (DESYREL ) 50 MG tablet TAKE HALF TO 1 TABLET BY MOUTH AT BEDTIME AS NEEDED FOR SLEEP. TAKE WITH TRAZODONE  100MG  AT BEDTIME.   venlafaxine  XR (EFFEXOR -XR) 75 MG 24 hr capsule TAKE 1 CAPSULE BY MOUTH DAILY WITH BREAKFAST.   No facility-administered encounter medications on file as of 04/06/2024.    Social History: Social History   Tobacco Use   Smoking status: Never   Smokeless tobacco: Never   Tobacco comments:    tobacco use - no  Vaping Use   Vaping status: Never Used  Substance Use Topics   Alcohol use: No   Drug use: No    Family Medical History: Family History  Problem Relation Age of Onset   Cancer Mother        lung   Lung cancer Mother    Diabetes Mother    Heart failure Mother    Lung cancer Father    Diabetes Sister    Fibromyalgia Sister    Colon cancer Maternal Aunt    Skin cancer Paternal Aunt    Skin cancer Paternal Uncle    Thyroid  cancer Neg Hx     Physical  Examination: @VITALWITHPAIN @  General: Patient is well developed, well nourished, calm, collected, and in no apparent distress. Attention to examination is appropriate.  Psychiatric: Patient is non-anxious.  Head:  Pupils equal, round, and reactive to light.  ENT:  Oral mucosa appears well hydrated.  Neck:   Supple.  ***Full range of motion.  Respiratory: Patient is breathing without any difficulty.  Extremities: No edema.  Vascular: Palpable dorsal pedal pulses.  Skin:   On exposed skin, there are no abnormal skin lesions.  NEUROLOGICAL:     Awake, alert, oriented to person, place, and time.  Speech is clear and fluent. Fund of knowledge is appropriate.   Cranial Nerves: Pupils equal round and reactive to light.  Facial tone is symmetric.  Facial sensation is symmetric.  ROM of spine: ***full.  Palpation of spine: ***non tender.    Strength: Side Biceps Triceps Deltoid Interossei Grip Wrist Ext. Wrist Flex.  R 5 5 5 5 5 5 5   L 5 5 5 5 5 5 5    Side Iliopsoas Quads Hamstring PF DF EHL  R 5 5 5 5 5 5   L 5 5 5 5 5 5    Reflexes are ***2+ and symmetric at the biceps, triceps, brachioradialis, patella and achilles.   Hoffman's is absent.  Clonus is not present.  Toes are down-going.  Bilateral upper and lower extremity sensation is intact to light touch.    Gait is normal.   No difficulty with tandem gait.   No evidence of dysmetria noted.  Medical Decision Making  Imaging: ***  I have personally reviewed the images and agree with the above interpretation.  Assessment and Plan: Ms. Ogburn is a pleasant 58 y.o. female with ***    Thank you for involving me in the care of this patient.   I spent a total of *** minutes in both face-to-face and non-face-to-face activities for this visit on the date of this encounter.   Lyle Decamp, PA-C Dept. of Neurosurgery

## 2024-04-03 NOTE — Assessment & Plan Note (Signed)
 Chronic, suboptimal.  Advised B12 injection at home weekly x 4 weeks and then to resume monthly.

## 2024-04-03 NOTE — Assessment & Plan Note (Signed)
 Chronic, suboptimal control.  Discussed grief over the loss of loved ones.  Advised to look into grief [email or web address]. Continue Effexor  75 mg daily,  trazodone  125-150mg  at bedtime.Increase BuSpar  to 10 mg in the morning, continue 5 mg midday and 5 mg in the afternoon.  Close follow-up

## 2024-04-06 ENCOUNTER — Ambulatory Visit: Admitting: Physician Assistant

## 2024-04-19 ENCOUNTER — Inpatient Hospital Stay: Admitting: Oncology

## 2024-04-19 ENCOUNTER — Encounter: Payer: Self-pay | Admitting: Oncology

## 2024-04-19 ENCOUNTER — Inpatient Hospital Stay: Attending: Oncology

## 2024-05-05 ENCOUNTER — Other Ambulatory Visit: Payer: Self-pay | Admitting: Family

## 2024-05-05 DIAGNOSIS — I159 Secondary hypertension, unspecified: Secondary | ICD-10-CM

## 2024-05-27 NOTE — Progress Notes (Unsigned)
 Referring Physician:  Kemp Coke [address],  [place] [address]  Primary Physician:  Dineen Rollene MATSU, FNP  History of Present Illness: 05/31/2024 [name] is here today with a past medical history of L4-5 fusion and spinal cord stimulator placement comes today for worsening lumbar back pain over the past year.  She states that it is stabbing and sharp in nature and worse with prolonged standing, and activities around the house.  The pain shoots down to bilateral feet on both the top and bottom and she often will have cramping in her feet as well.  Numbness and tingling on the outside of her right thigh is very painful.  She feels unsteady on her feet and feels like she cannot walk a long distance.    Weakness: none Bowel/Bladder Dysfunction: none  Conservative measures:  Physical therapy:  has participated in but not in 2025 or 2024 Multimodal medical therapy including regular antiinflammatories:  oxycodone , lyrica , fentanyl patch, flexeril, gabapentin   Injections:  multiple injections at [facility] March 2024: Right L5-S1 ESI  Past Surgery:  Back Surgery in 2009 and 2015 [facility] 07/04/2016    The symptoms are causing a significant impact on the patient's life.   Review of Systems:  A 10 point review of systems is negative, except for the pertinent positives and negatives detailed in the HPI.  Past Medical History: Past Medical History:  Diagnosis Date   Anemia    Anxiety and depression    Chronic back pain    DM2 (diabetes mellitus, type 2) (HCC)    improved since gastric bypass   Headache    stress/sinus - 2-3x/wk   History of left shoulder fracture    HLD (hyperlipidemia)    HTN (hypertension)    improved since gastric bypass   Hypothyroid    Left leg weakness    s/p back surgery   Migraines    Obesity    Pernicious anemia    S/P insertion of spinal cord stimulator    Seizure (HCC)    told she had a seizure as young child,  not sure of context; mother states she didnt witness   Wears contact lenses     Past Surgical History: Past Surgical History:  Procedure Laterality Date   Abd US  - gallstones  01/2002   BACK SURGERY  2009   decompressive laminectomy   BARIATRIC SURGERY  03/02/2012   gastric bypass; Dr Wolm   CHOLECYSTECTOMY  02/2002   COLONOSCOPY WITH PROPOFOL  N/A 05/01/2017   Procedure: COLONOSCOPY WITH PROPOFOL ;  Surgeon: Jinny Carmine, MD;  Location: [facility];  Service: Gastroenterology;  Laterality: N/A;   COLONOSCOPY WITH PROPOFOL  N/A 10/27/2023   Procedure: COLONOSCOPY WITH PROPOFOL ;  Surgeon: Therisa Bi, MD;  Location: [facility] END[facility]COPY;  Service: Gastroenterology;  Laterality: N/A;   DILATION AND CURETTAGE OF UTERUS  09/2003   ESOPHAGOGASTRODUODENOSCOPY (EGD) WITH PROPOFOL  N/A 05/01/2017   Procedure: ESOPHAGOGASTRODUODENOSCOPY (EGD) WITH PROPOFOL ;  Surgeon: Jinny Carmine, MD;  Location: [facility];  Service: Gastroenterology;  Laterality: N/A;   ESOPHAGOGASTRODUODENOSCOPY (EGD) WITH PROPOFOL  N/A 10/27/2023   Procedure: ESOPHAGOGASTRODUODENOSCOPY (EGD) WITH PROPOFOL ;  Surgeon: Therisa Bi, MD;  Location: [facility] END[facility]COPY;  Service: Gastroenterology;  Laterality: N/A;   KNEE SURGERY     L - arthroscopic   LIVER BIOPSY  02/2002   fatty liver   LUMBAR SPINE SURGERY  2015   Dr. Watt   NASAL SINUS SURGERY     PANNICULECTOMY  08/2013   [facility],  Dr. Timm   SHOULDER ARTHROSCOPY Left 10/06/2019   sleep study - apnea  04/2001   improved since gastric bypass   SPINAL CORD STIMULATOR INSERTION  07/04/2016    Allergies: Allergies as of 05/31/2024 - Review Complete 03/31/2024  Allergen Reaction Noted   Latex Rash 03/31/2018   Penicillins Itching and Swelling 12/28/2007    Medications: Outpatient Encounter Medications as of 05/31/2024  Medication Sig   Aspirin -Acetaminophen -Caffeine (EXCEDRIN MIGRAINE PO) Take by mouth.   busPIRone  (BUSPAR ) 5 MG tablet Take 10mg  qam, 5mg  midday  and 5mg  in the afternoon.   cyanocobalamin  (VITAMIN B12) 1000 MCG/ML injection 1000 MCG (1 ML) INTRAMUSCULAR INJECTION IN THE THIGH ( VASTUS LATERALIS) ONCE PER MONTH.   cyclobenzaprine (FLEXERIL) 10 MG tablet Take 10 mg by mouth daily as needed.   Estradiol -Norethindrone  Acet 0.5-0.1 MG tablet Take 1 tablet by mouth daily.   fentaNYL (DURAGESIC) 12 MCG/HR 1 patch every 3 (three) days.   furosemide  (LASIX ) 20 MG tablet Take 1 tablet (20 mg total) by mouth 3 (three) times a week.   hydrochlorothiazide  (MICROZIDE ) 12.5 MG capsule TAKE 1 CAPSULE BY MOUTH EVERY DAY   NEEDLE, DISP, 25 G (EASY TOUCH FLIPLOCK NEEDLES) 25G X 1 MISC Used to give b12 injections monthly   ondansetron  (ZOFRAN -ODT) 4 MG disintegrating tablet Take 1 tablet (4 mg total) by mouth every 8 (eight) hours as needed for nausea or vomiting.   pregabalin  (LYRICA ) 100 MG capsule Take 1 capsule (100 mg total) by mouth 3 (three) times daily. Take with lyrica  25mg  TID.   pregabalin  (LYRICA ) 25 MG capsule Take 1 capsule (25 mg total) by mouth 3 (three) times daily. Take with lyrica  100mg  TID   Syringe/Needle, Disp, (SYRINGE 3CC/25GX1) 25G X 1 3 ML MISC Use to give b12 injections monthly   traZODone  (DESYREL ) 100 MG tablet Take 1 tablet by mouth everyday at bedtime   traZODone  (DESYREL ) 50 MG tablet TAKE HALF TO 1 TABLET BY MOUTH AT BEDTIME AS NEEDED FOR SLEEP. TAKE WITH TRAZODONE  100MG  AT BEDTIME.   venlafaxine  XR (EFFEXOR -XR) 75 MG 24 hr capsule TAKE 1 CAPSULE BY MOUTH DAILY WITH BREAKFAST.   [DISCONTINUED] potassium chloride  (KLOR-CON ) 20 MEQ packet Take 20 mEq by mouth 3 (three) times a week.   No facility-administered encounter medications on file as of 05/31/2024.    Social History: Social History   Tobacco Use   Smoking status: Never   Smokeless tobacco: Never   Tobacco comments:    tobacco use - no  Vaping Use   Vaping status: Never Used  Substance Use Topics   Alcohol use: No   Drug use: No    Family Medical  History: Family History  Problem Relation Age of Onset   Cancer Mother        lung   Lung cancer Mother    Diabetes Mother    Heart failure Mother    Lung cancer Father    Alcohol abuse Father    Cancer Father    Early death Father    Diabetes Sister    Fibromyalgia Sister    Colon cancer Maternal Aunt    Skin cancer Paternal Aunt    Skin cancer Paternal Uncle    Heart disease Paternal Grandmother    Thyroid  cancer Neg Hx     Physical Examination: @VITALWITHPAIN @  General: Patient is well developed, well nourished, calm, collected, and in no apparent distress. Attention to examination is appropriate.  Psychiatric: Patient is non-anxious.  Head:  Pupils equal, round,  and reactive to light.  ENT:  Oral mucosa appears well hydrated.  Neck:   Supple.    Respiratory: Patient is breathing without any difficulty.  Extremities: No edema.  Vascular: Palpable dorsal pedal pulses.  Skin:   On exposed skin, there are no abnormal skin lesions.  NEUROLOGICAL:     Awake, alert, oriented to person, place, and time.  Speech is clear and fluent. Fund of knowledge is appropriate.   Cranial Nerves: Pupils equal round and reactive to light.  Facial tone is symmetric.   ROM of spine: Mild tenderness palpation of thoracic and lumbar paraspinals.   + Lhermittes Pain with internal and external rotation of hip   Strength: Side Biceps Triceps Deltoid Interossei Grip Wrist Ext. Wrist Flex.  R 5 5 4 5 5 5 5   L 5 5 4 5 5 5 5    Side Iliopsoas Quads Hamstring PF DF EHL  R 5 5 5 5 5 5   L 5 5 5 5 5 5    Reflexes are 2+ and symmetric at the biceps, triceps, brachioradialis, patella and achilles.   Hoffman's is absent.  Clonus is not present.  Toes are down-going.  Bilateral upper and lower extremity sensation is intact to light touch.    Gait is normal.   No difficulty with tandem gait.   No evidence of dysmetria noted.  Medical Decision Making  Imaging: CT lumbar spine without  contrast, 10/05/2023  IMPRESSION: 1. No acute osseous abnormality in the Lumbar Spine. Chronic L4-L5 decompression and fusion with solid-appearing posterior element predominant arthrodesis, no hardware loosening. 2. 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 This level does not appear significantly changed from a 2023 CT. 3. Lesser chronic lumbar spine degeneration elsewhere also stable since 2023, including chronic right lateral recess and right foraminal stenosis at L5-S1. 4. Chronic thoracic spinal stimulator.  Chronic gastric bypass.    I have personally reviewed the images and agree with the above interpretation.  Assessment and Plan: [name] is a pleasant 58 y.o. female has a past medical history of L4-5 fusion and spinal cord stimulator placement comes today for worsening lumbar back pain over the past year.  Pain radiates to bilateral lower extremities, but is worse on the outside of her right hip extending into her thigh and groin.  She also had pain with internal and external rotation of her hip on examination.  MRI that was completed for lumbar spine showed no hardware failure and no significant changes over the past 2 years.  She does have some adjacent segment disease at the level above her fusion at L3-4 with some moderate stenosis present there.  Concern for possible hip pathology as well however.  Plan includes the following:  -Referral made to physical therapy - Would like further eval from orthopedic surgery for right hip pain - Would like flexion-extension x-rays of lumbar spine as well as of her right hip - MRI of lumbar spine has been ordered.  Will review results once complete - See back in 8 weeks. - Per the patient she states that she would be open to injections in the future.    Thank you for involving me in the care of this  patient.     Lyle Decamp, PA-C Dept. of Neurosurgery

## 2024-05-31 ENCOUNTER — Ambulatory Visit
Admission: RE | Admit: 2024-05-31 | Discharge: 2024-05-31 | Disposition: A | Discharge status: Home / Self Care | Source: Ambulatory Visit | Attending: Physician Assistant | Admitting: Physician Assistant

## 2024-05-31 ENCOUNTER — Encounter: Payer: Self-pay | Admitting: Physician Assistant

## 2024-05-31 ENCOUNTER — Ambulatory Visit
Admission: RE | Admit: 2024-05-31 | Discharge: 2024-05-31 | Disposition: A | Discharge status: Home / Self Care | Attending: Physician Assistant | Admitting: Physician Assistant

## 2024-05-31 ENCOUNTER — Ambulatory Visit (INDEPENDENT_AMBULATORY_CARE_PROVIDER_SITE_OTHER): Admitting: Physician Assistant

## 2024-05-31 VITALS — BP 116/60 | Ht 65.5 in | Wt 213.4 lb

## 2024-05-31 DIAGNOSIS — M48061 Spinal stenosis, lumbar region without neurogenic claudication: Secondary | ICD-10-CM

## 2024-05-31 DIAGNOSIS — M549 Dorsalgia, unspecified: Secondary | ICD-10-CM | POA: Diagnosis not present

## 2024-05-31 DIAGNOSIS — M419 Scoliosis, unspecified: Secondary | ICD-10-CM | POA: Diagnosis not present

## 2024-05-31 DIAGNOSIS — M25551 Pain in right hip: Secondary | ICD-10-CM | POA: Diagnosis not present

## 2024-05-31 DIAGNOSIS — Z981 Arthrodesis status: Secondary | ICD-10-CM | POA: Diagnosis not present

## 2024-05-31 DIAGNOSIS — Z9682 Presence of neurostimulator: Secondary | ICD-10-CM | POA: Diagnosis not present

## 2024-05-31 DIAGNOSIS — M48062 Spinal stenosis, lumbar region with neurogenic claudication: Secondary | ICD-10-CM | POA: Insufficient documentation

## 2024-05-31 DIAGNOSIS — M160 Bilateral primary osteoarthritis of hip: Secondary | ICD-10-CM | POA: Diagnosis not present

## 2024-06-03 DIAGNOSIS — Z0189 Encounter for other specified special examinations: Secondary | ICD-10-CM | POA: Diagnosis not present

## 2024-06-08 ENCOUNTER — Ambulatory Visit

## 2024-06-11 DIAGNOSIS — M75102 Unspecified rotator cuff tear or rupture of left shoulder, not specified as traumatic: Secondary | ICD-10-CM | POA: Diagnosis not present

## 2024-06-12 ENCOUNTER — Other Ambulatory Visit: Payer: Self-pay | Admitting: Family

## 2024-06-12 DIAGNOSIS — I159 Secondary hypertension, unspecified: Secondary | ICD-10-CM

## 2024-06-13 DIAGNOSIS — M25512 Pain in left shoulder: Secondary | ICD-10-CM | POA: Diagnosis not present

## 2024-06-15 DIAGNOSIS — Z79899 Other long term (current) drug therapy: Secondary | ICD-10-CM | POA: Diagnosis not present

## 2024-06-15 DIAGNOSIS — M533 Sacrococcygeal disorders, not elsewhere classified: Secondary | ICD-10-CM | POA: Diagnosis not present

## 2024-06-15 DIAGNOSIS — G8929 Other chronic pain: Secondary | ICD-10-CM | POA: Diagnosis not present

## 2024-06-15 DIAGNOSIS — M51372 Other intervertebral disc degeneration, lumbosacral region with discogenic back pain and lower extremity pain: Secondary | ICD-10-CM | POA: Diagnosis not present

## 2024-06-15 DIAGNOSIS — M5126 Other intervertebral disc displacement, lumbar region: Secondary | ICD-10-CM | POA: Diagnosis not present

## 2024-06-21 ENCOUNTER — Encounter: Payer: Self-pay | Admitting: Family

## 2024-06-21 ENCOUNTER — Encounter: Payer: Self-pay | Admitting: Oncology

## 2024-06-21 ENCOUNTER — Ambulatory Visit: Payer: Self-pay | Admitting: Family

## 2024-06-21 VITALS — BP 120/70 | HR 61 | Temp 97.7°F | Ht 65.0 in | Wt 221.6 lb

## 2024-06-21 DIAGNOSIS — Z23 Encounter for immunization: Secondary | ICD-10-CM | POA: Diagnosis not present

## 2024-06-21 DIAGNOSIS — R7303 Prediabetes: Secondary | ICD-10-CM | POA: Diagnosis not present

## 2024-06-21 DIAGNOSIS — I159 Secondary hypertension, unspecified: Secondary | ICD-10-CM

## 2024-06-21 DIAGNOSIS — Z0000 Encounter for general adult medical examination without abnormal findings: Secondary | ICD-10-CM

## 2024-06-21 DIAGNOSIS — Z1231 Encounter for screening mammogram for malignant neoplasm of breast: Secondary | ICD-10-CM

## 2024-06-21 LAB — CBC WITH DIFFERENTIAL/PLATELET
Basophils Absolute: 0 K/uL (ref 0.0–0.1)
Basophils Relative: 0.5 % (ref 0.0–3.0)
Eosinophils Absolute: 0.1 K/uL (ref 0.0–0.7)
Eosinophils Relative: 2.8 % (ref 0.0–5.0)
HCT: 34.7 % — ABNORMAL LOW (ref 36.0–46.0)
Hemoglobin: 11.2 g/dL — ABNORMAL LOW (ref 12.0–15.0)
Lymphocytes Relative: 25.1 % (ref 12.0–46.0)
Lymphs Abs: 1.3 K/uL (ref 0.7–4.0)
MCHC: 32.4 g/dL (ref 30.0–36.0)
MCV: 87 fl (ref 78.0–100.0)
Monocytes Absolute: 0.4 K/uL (ref 0.1–1.0)
Monocytes Relative: 8.8 % (ref 3.0–12.0)
Neutro Abs: 3.1 K/uL (ref 1.4–7.7)
Neutrophils Relative %: 62.8 % (ref 43.0–77.0)
Platelets: 189 K/uL (ref 150.0–400.0)
RBC: 3.98 Mil/uL (ref 3.87–5.11)
RDW: 15.1 % (ref 11.5–15.5)
WBC: 5 K/uL (ref 4.0–10.5)

## 2024-06-21 LAB — TSH: TSH: 3.65 u[IU]/mL (ref 0.35–5.50)

## 2024-06-21 LAB — B12 AND FOLATE PANEL
Folate: >22.9 ng/mL (ref >5.9)
Vitamin B-12: 228 pg/mL (ref 211–911)

## 2024-06-21 LAB — HEMOGLOBIN A1C: Hgb A1c MFr Bld: 6.7 % — ABNORMAL HIGH (ref 4.6–6.5)

## 2024-06-21 LAB — VITAMIN D 25 HYDROXY (VIT D DEFICIENCY, FRACTURES): VITD: 57.92 ng/mL (ref 30.00–100.00)

## 2024-06-21 MED ORDER — METFORMIN HCL ER 500 MG PO TB24: 500.0000 mg | ORAL_TABLET | Freq: Every evening | ORAL | 2 refills | Status: DC

## 2024-06-21 MED ORDER — HYDROCHLOROTHIAZIDE 12.5 MG PO CAPS: 12.5000 mg | ORAL_CAPSULE | Freq: Every day | ORAL | 3 refills | Status: AC

## 2024-06-21 NOTE — Progress Notes (Signed)
 Assessment & Plan:  Annual physical exam Assessment & Plan: Deferred CBE due to patient preference. She will schedule mammogram. She declines colonoscopy after poor prep ; she has follow up with Dr [name] in December in which she can discuss screening options/prep options. She will make follow up with GYN as pap smear is overdue. Influenza and PCV20 vaccine given today.  Orders: -     VITAMIN D  25 Hydroxy (Vit-D Deficiency, Fractures) -     Hemoglobin A1c -     CBC with Differential/Platelet -     Iron , TIBC and Ferritin Panel -     metFORMIN  HCl ER; Take 1 tablet (500 mg total) by mouth every evening.  Dispense: 90 tablet; Refill: 2 -     B12 and Folate Panel -     hydroCHLOROthiazide ; Take 1 capsule (12.5 mg total) by mouth daily.  Dispense: 90 capsule; Refill: 3 -     3D Screening Mammogram, Left and Right; Future  Secondary hypertension Assessment & Plan: Chronic, stable . Continue hydrochlorothiazide   12.5 mg daily.  Orders: -     hydroCHLOROthiazide ; Take 1 capsule (12.5 mg total) by mouth daily.  Dispense: 90 capsule; Refill: 3 -     TSH  Prediabetes Assessment & Plan: Counseled on creating caloric deficit and abstaining from soda. Trial of metformin  and titrate for appetite suppression.   Orders: -     Hemoglobin A1c  Encounter for screening mammogram for malignant neoplasm of breast -     3D Screening Mammogram, Left and Right; Future  Need for immunization against influenza -     Flu vaccine trivalent PF, 6mos and older(Flulaval,Afluria,Fluarix,Fluzone)  Immunization due -     Pneumococcal conjugate vaccine 20-valent     Return precautions given.   Risks, benefits, and alternatives of the medications and treatment plan prescribed today were discussed, and patient expressed understanding.   Education regarding symptom management and diagnosis given to patient on AVS either electronically or printed.  Return in about 3 months (around 09/21/2024).  Cheryl Northern, FNP  Subjective:    Patient ID: [name], female    DOB: 20-Mar-1966, 58 y.o.   MRN: [id number]  CC: [name] is a 58 y.o. female who presents today for physical exam.    HPI: She is frustrated by weight gain.  Breakfast may be graham crackers, lunch is a lunchable, and she may skip dinner or may go out for dinner. She drinks regular gingerale 2 sixteen ounces per day.   H/o bariatric surgery.     She is not bariatric vitamin. Compliant b12 IM monthly at home.   Upcoming appointment with dr [name] 08/30/24  Colorectal Cancer Screening: due; supoptimal prep 10/2023 Breast Cancer Screening: Mammogram due Cervical Cancer Screening: due; 03/14/19 NILM, neg HPV; She is following with [name] Bone Health screening/DEXA for 65+: No increased fracture risk. Defer screening at this time. Lung Cancer Screening: Doesn't have 20 year pack year history and age > 50 years yo 52 years        Tetanus - UTD        Pneumococcal - Candidate for.   Exercise:  regular exercise limited by chronic back pain.   Alcohol use:  none Smoking/tobacco use: Nonsmoker.    Health Maintenance  Topic Date Due   Zoster (Shingles) Vaccine (2 of 2) 07/12/2020   Pap with HPV screening  03/13/2024   COVID-19 Vaccine (4 - 2025-26 season) 05/16/2024   Breast Cancer Screening  06/21/2024*   DTaP/Tdap/Td vaccine (2 - Td or Tdap) 01/01/2029   Colon Cancer Screening  10/26/2033   Pneumococcal Vaccine for age over 35  Completed   Flu Shot  Completed   Hepatitis B Vaccine  Completed   Hepatitis C Screening  Completed   HIV Screening  Completed   HPV Vaccine  Aged Out   Meningitis B Vaccine  Aged Out  *Topic was postponed. The date shown is not the original due date.    ALLERGIES: Latex and Penicillins  Current Outpatient Medications on File Prior to Visit  Medication Sig Dispense Refill   Aspirin -Acetaminophen -Caffeine (EXCEDRIN MIGRAINE PO) Take by mouth.     busPIRone   (BUSPAR ) 5 MG tablet Take 10mg  qam, 5mg  midday and 5mg  in the afternoon. 360 tablet 1   cyanocobalamin  (VITAMIN B12) 1000 MCG/ML injection 1000 MCG (1 ML) INTRAMUSCULAR INJECTION IN THE THIGH ( VASTUS LATERALIS) ONCE PER MONTH. 3 mL 5   cyclobenzaprine (FLEXERIL) 10 MG tablet Take 10 mg by mouth daily as needed.     Estradiol -Norethindrone  Acet 0.5-0.1 MG tablet Take 1 tablet by mouth daily. 84 tablet 3   fentaNYL (DURAGESIC) 12 MCG/HR 1 patch every 3 (three) days.     furosemide  (LASIX ) 20 MG tablet Take 1 tablet (20 mg total) by mouth 3 (three) times a week. 36 tablet 3   NEEDLE, DISP, 25 G (EASY TOUCH FLIPLOCK NEEDLES) 25G X 1 MISC Used to give b12 injections monthly 50 each 0   ondansetron  (ZOFRAN -ODT) 4 MG disintegrating tablet Take 1 tablet (4 mg total) by mouth every 8 (eight) hours as needed for nausea or vomiting. 12 tablet 0   pregabalin  (LYRICA ) 100 MG capsule Take 1 capsule (100 mg total) by mouth 3 (three) times daily. Take with lyrica  25mg  TID. 90 capsule 2   pregabalin  (LYRICA ) 25 MG capsule Take 1 capsule (25 mg total) by mouth 3 (three) times daily. Take with lyrica  100mg  TID 90 capsule 2   Syringe/Needle, Disp, (SYRINGE 3CC/25GX1) 25G X 1 3 ML MISC Use to give b12 injections monthly 50 each 0   traMADol  (ULTRAM ) 50 MG tablet Take 50 mg by mouth every 6 (six) hours as needed.     traZODone  (DESYREL ) 100 MG tablet Take 1 tablet by mouth everyday at bedtime 90 tablet 3   traZODone  (DESYREL ) 50 MG tablet TAKE HALF TO 1 TABLET BY MOUTH AT BEDTIME AS NEEDED FOR SLEEP. TAKE WITH TRAZODONE  100MG  AT BEDTIME. 90 tablet 2   venlafaxine  XR (EFFEXOR -XR) 75 MG 24 hr capsule TAKE 1 CAPSULE BY MOUTH DAILY WITH BREAKFAST. 90 capsule 1   No current facility-administered medications on file prior to visit.    Review of Systems  Constitutional:  Negative for chills and fever.  Respiratory:  Negative for cough.   Cardiovascular:  Negative for chest pain and palpitations.  Gastrointestinal:   Negative for nausea and vomiting.      Objective:    BP 120/70   Pulse 61   Temp 97.7 F (36.5 C) (Oral)   Ht 5' 5 (1.651 m)   Wt 221 lb 9.6 oz (100.5 kg)   LMP 11/28/2019 (Approximate)   SpO2 98%   BMI 36.88 kg/m   BP Readings from Last 3 Encounters:  06/21/24 120/70  05/31/24 116/60  03/31/24 136/78   Wt Readings from Last 3 Encounters:  06/21/24 221 lb 9.6 oz (100.5 kg)  05/31/24 213 lb 6 oz (96.8 kg)  03/31/24 219 lb 9.6 oz (99.6 kg)  Physical Exam Vitals reviewed.  Constitutional:      Appearance: She is well-developed.  Eyes:     Conjunctiva/sclera: Conjunctivae normal.  Neck:     Thyroid : No thyroid  mass or thyromegaly.  Cardiovascular:     Rate and Rhythm: Normal rate and regular rhythm.     Pulses: Normal pulses.     Heart sounds: Normal heart sounds.  Pulmonary:     Effort: Pulmonary effort is normal.     Breath sounds: Normal breath sounds. No wheezing, rhonchi or rales.  Lymphadenopathy:     Head:     Right side of head: No submental, submandibular, tonsillar, preauricular, posterior auricular or occipital adenopathy.     Left side of head: No submental, submandibular, tonsillar, preauricular, posterior auricular or occipital adenopathy.     Cervical: No cervical adenopathy.  Skin:    General: Skin is warm and dry.  Neurological:     Mental Status: She is alert.  Psychiatric:        Speech: Speech normal.        Behavior: Behavior normal.        Thought Content: Thought content normal.

## 2024-06-21 NOTE — Assessment & Plan Note (Signed)
 Counseled on creating caloric deficit and abstaining from soda. Trial of metformin  and titrate for appetite suppression.

## 2024-06-21 NOTE — Assessment & Plan Note (Signed)
 Deferred CBE due to patient preference. She will schedule mammogram. She declines colonoscopy after poor prep ; she has follow up with Dr Maryruth in December in which she can discuss screening options/prep options. She will make follow up with GYN as pap smear is overdue. Influenza and PCV20 vaccine given today.

## 2024-06-21 NOTE — Assessment & Plan Note (Signed)
 Chronic, stable.  Continue hydrochlorothiazide 12.5 mg daily

## 2024-06-21 NOTE — Patient Instructions (Addendum)
 Please call  and schedule your 3D mammogram and /or bone density scan as we discussed.   [facility]  ( new location in 2023)  [address], [place], [place] [address]  [place], [place]  [phone]    Metformin  is used in prediabetes, diabetes, and also for weight loss by decreasing calorie consumption.   It works in a couple of ways by decreasing liver glucose production, decreases intestinal absorption of glucose and improves insulin  sensitivity (increases peripheral glucose uptake and utilization).     Please make sure that you titrate per below so not to cause any GI upset.    Start metformin  XR with one 500mg  tablet at night. After one week, you may increase to two tablets at night ( total of 1000mg ) . The third week, you may take take two tablets at night and one tablet in the morning.  The fourth week, you may take two tablets in the morning ( 1000mg  total) and two tablets at night (1000mg  total). This will bring you to a maximum daily dose of 2000mg /day which is maximum dose.  So you are aware,  you may take ALL 4 tablets of metformin  together at the same time if preferable and doesn't cause GI upset. You may take metformin  2000mg  ( four of the 500mg  tablets) together in the morning or at night if you prefer.   Along the way, if you want to increase more slowly, please do as this medication can cause GI discomfort and loose stools which usually get better with time , however some patients find that they can only tolerate a certain dose and cannot increase to maximum dose.    Please download Myfitness Pal App ( basic version is free).   You may log every thing you eat for even 2-3 days to get a better of idea of total daily calories. To loose weight, we have to create caloric deficit to loose weight. The goal is 1-2 lbs per week of weight loss.   Excellent article below from [facility].    https://www.health.CriticalZ.it  Calorie counting made easy  Eat less, exercise more. If only it were that simple! As most dieters know, losing weight can be very challenging. As this report details, a range of influences can affect how people gain and lose weight. But a basic understanding of how to tip your energy balance in favor of weight loss is a good place to start.  Start by determining how many calories you should consume each day. To do so, you need to know how many calories you need to maintain your current weight. Doing this requires a few simple calculations.  First, multiply your current weight by 15 -- that's roughly the number of calories per pound of body weight needed to maintain your current weight if you are moderately active. Moderately active means getting at least 30 minutes of physical activity a day in the form of exercise (walking at a brisk pace, climbing stairs, or active gardening). Let's say you're a woman who is 5 feet, 4 inches tall and weighs 155 pounds, and you need to lose about 15 pounds to put you in a healthy weight range. If you multiply 155 by 15, you will get 2,325, which is the number of calories per day that you need in order to maintain your current weight (weight-maintenance calories). To lose weight, you will need to get below that total.  For example, to lose 1 to 2 pounds a week --  a rate that experts consider safe -- your food consumption should provide 500 to 1,000 calories less than your total weight-maintenance calories. If you need 2,325 calories a day to maintain your current weight, reduce your daily calories to between 1,325 and 1,825. If you are sedentary, you will also need to build more activity into your day. In order to lose at least a pound a week, try to do at least 30 minutes of physical activity on most days, and reduce your daily calorie intake by at least 500 calories. However, calorie intake should  not fall below 1,200 a day in women or 1,500 a day in men, except under the supervision of a health professional. Eating too few calories can endanger your health by depriving you of needed nutrients.  Meeting your calorie target How can you meet your daily calorie target? One approach is to add up the number of calories per serving of all the foods that you eat, and then plan your menus accordingly. You can buy books that list calories per serving for many foods. In addition, the nutrition labels on all packaged foods and beverages provide calories per serving information. Make a point of reading the labels of the foods and drinks you use, noting the number of calories and the serving sizes. Many recipes published in cookbooks, newspapers, and magazines provide similar information.  If you hate counting calories, a different approach is to restrict how much and how often you eat, and to eat meals that are low in calories. Dietary guidelines issued by the American Heart Association stress common sense in choosing your foods rather than focusing strictly on numbers, such as total calories or calories from fat. Whichever method you choose, research shows that a regular eating schedule -- with meals and snacks planned for certain times each day -- makes for the most successful approach. The same applies after you have lost weight and want to keep it off. Sticking with an eating schedule increases your chance of maintaining your new weight.    This is  Dr. Tullo's  ( an amazing physician in my office!)  example of a  Low GI  Diet:  It will allow you to lose 4 to 8  lbs  per month if you follow it carefully.  Your goal with exercise is a minimum of 30 minutes of aerobic exercise 5 days per week (Walking does not count once it becomes easy!)    All of the foods can be found at grocery stores and in bulk at [employer].  The Atkins protein bars and shakes are available in more varieties at Target, WalMart and  Lowe's Foods.     7 AM Breakfast:  Choose from the following:  Low carbohydrate Protein  Shakes (I recommend the  Premier Protein chocolate shakes,  EAS AdvantEdge Carb Control shakes  Or the Atkins shakes all are under 3 net carbs)     a scrambled egg/bacon/cheese burrito made with Mission's carb balance whole wheat tortilla  (about 10 net carbs )  Medical laboratory scientific officer (basically a quiche without the pastry crust) that is eaten cold and very convenient way to get your eggs.  8 carbs)  If you make your own protein shakes, avoid bananas and pineapple,  And use low carb greek yogurt or original /unsweetened almond or soy milk    Avoid cereal and bananas, oatmeal and cream of wheat and grits. They are loaded with carbohydrates!   10 AM: high protein  snack:  Protein bar by Atkins (the snack size, under 200 cal, usually < 6 net carbs).    A stick of cheese:  Around 1 carb,  100 cal     Dannon Light n Fit [place] Yogurt  (80 cal, 8 carbs)  Other so called protein bars and Greek yogurts tend to be loaded with carbohydrates.  Remember, in food advertising, the word energy is synonymous for  carbohydrate.  Lunch:   A Sandwich using the bread choices listed, Can use any  Eggs,  lunchmeat, grilled meat or canned tuna), avocado, regular mayo/mustard  and cheese.  A Salad using blue cheese, ranch,  Goddess or vinagrette,  Avoid taco shells, croutons or confetti and no candied nuts but regular nuts OK.   No pretzels, nabs  or chips.  Pickles and miniature sweet peppers are a good low carb alternative that provide a crunch  The bread is the only source of carbohydrate in a sandwich and  can be decreased by trying some of the attached alternatives to traditional loaf bread   Avoid Low fat dressings, as well as Oakley and [employer] dressings They are loaded with sugar!   3 PM/ Mid day  Snack:  Consider  1 ounce of  almonds, walnuts, pistachios, pecans, peanuts,   Macadamia nuts or a nut medley.  Avoid granola and granola bars   Mixed nuts are ok in moderation as long as there are no raisins,  cranberries or dried fruit.   KIND bars are OK if you get the low glycemic index variety   Try the prosciutto/mozzarella cheese sticks by [employer]  In deli /backery section   High protein      6 PM  Dinner:     Meat/fowl/fish with a green salad, and either broccoli, cauliflower, green beans, spinach, brussel sprouts or  Lima beans. DO NOT BREAD THE PROTEIN!!      There is a low carb pasta by Dreamfield's that is acceptable and tastes great: only 5 digestible carbs/serving.( All grocery stores but BJs carry it ) Several ready made meals are available low carb:   Try Michel Angelo's chicken piccata or chicken or eggplant parm over low carb pasta.(Lowes and BJs)   [place] Carnitas (pulled pork, no sauce,  0 carbs) or his beef pot roast to make a dinner burrito (at [employer])  Pesto over low carb pasta (bj's sells a good quality pesto in the center refrigerated section of the deli   Try satueeing  Glenna Blonder with mushroooms as a good side   [employer] Giant makes a mashed cauliflower that tastes like mashed potatoes  Whole wheat pasta is still full of digestible carbs and  Not as low in glycemic index as Dreamfield's.   Brown rice is still rice,  So skip the rice and noodles if you eat [place] or [place] (or at least limit to 1/2 cup)  9 PM snack :   Breyer's low carb fudgsicle or  ice cream bar (Carb Smart line), or  Weight Watcher's ice cream bar , or another no sugar added ice cream;  a serving of fresh berries/cherries with whipped cream   Cheese or DANNON'S LlGHT N FIT GREEK YOGURT  8 ounces of [place] unsweetened almond/cococunut milk    Treat yourself to a parfait made with whipped cream blueberiies, walnuts and vanilla greek yogurt  Avoid bananas, pineapple, grapes  and watermelon on a regular basis because they are high in sugar.  THINK OF THEM AS  DESSERT  Remember that snack Substitutions should be less than 10 NET carbs per serving and meals < 20 carbs. Remember to subtract fiber grams to get the net carbs.

## 2024-06-22 LAB — IRON,TIBC AND FERRITIN PANEL
%SAT: 17 % (ref 16–45)
Ferritin: 7 ng/mL — ABNORMAL LOW (ref 16–232)
Iron: 71 ug/dL (ref 45–160)
TIBC: 424 ug/dL (ref 250–450)

## 2024-06-23 ENCOUNTER — Ambulatory Visit: Payer: Self-pay | Admitting: Family

## 2024-06-26 ENCOUNTER — Other Ambulatory Visit: Payer: Self-pay | Admitting: Family

## 2024-06-26 DIAGNOSIS — R519 Headache, unspecified: Secondary | ICD-10-CM

## 2024-06-29 ENCOUNTER — Encounter: Payer: Self-pay | Admitting: Oncology

## 2024-07-01 DIAGNOSIS — M25512 Pain in left shoulder: Secondary | ICD-10-CM | POA: Diagnosis not present

## 2024-07-08 ENCOUNTER — Ambulatory Visit
Admission: RE | Admit: 2024-07-08 | Discharge: 2024-07-08 | Disposition: A | Discharge status: Home / Self Care | Payer: Worker's Compensation | Source: Ambulatory Visit | Attending: Gastroenterology | Admitting: Gastroenterology

## 2024-07-08 ENCOUNTER — Encounter: Payer: Self-pay | Admitting: Oncology

## 2024-07-08 ENCOUNTER — Other Ambulatory Visit: Payer: Self-pay | Admitting: Gastroenterology

## 2024-07-08 DIAGNOSIS — R0789 Other chest pain: Secondary | ICD-10-CM | POA: Diagnosis not present

## 2024-07-08 DIAGNOSIS — W19XXXA Unspecified fall, initial encounter: Secondary | ICD-10-CM | POA: Insufficient documentation

## 2024-07-08 DIAGNOSIS — M19071 Primary osteoarthritis, right ankle and foot: Secondary | ICD-10-CM | POA: Diagnosis not present

## 2024-07-08 DIAGNOSIS — M79671 Pain in right foot: Secondary | ICD-10-CM | POA: Insufficient documentation

## 2024-07-13 ENCOUNTER — Other Ambulatory Visit: Payer: Self-pay

## 2024-07-13 DIAGNOSIS — E538 Deficiency of other specified B group vitamins: Secondary | ICD-10-CM

## 2024-07-13 MED ORDER — CYANOCOBALAMIN 1000 MCG/ML IJ SOLN: INTRAMUSCULAR | 5 refills | Status: AC

## 2024-07-14 NOTE — Telephone Encounter (Signed)
 Copied from CRM [id number]. Topic: Clinical - Medication Question >> Jul 14, 2024  1:36 PM Deaijah H wrote: Reason for CRM: Patient called in stating she spoke with Dr. Dineen nurse regarding lab results and was informed she was diabetic so patient asked about Mounjaro and was suppose to receive call back regarding so but have not. Please call [phone]

## 2024-07-15 NOTE — Telephone Encounter (Signed)
 Copied from CRM [id number]. Topic: Clinical - Medication Question >> Jul 14, 2024  1:36 PM Deaijah H wrote: Reason for CRM: Patient called in stating she spoke with Dr. Dineen nurse regarding lab results and was informed she was diabetic so patient asked about Mounjaro and was suppose to receive call back regarding so but have not. Please call [phone] >> Jul 15, 2024  3:18 PM Franky GRADE wrote: Patient is caling to follow up as she has not heard back regarding the prescription for Mounjaro

## 2024-07-17 ENCOUNTER — Other Ambulatory Visit: Payer: Self-pay | Admitting: Family

## 2024-07-17 ENCOUNTER — Encounter: Payer: Self-pay | Admitting: Family

## 2024-07-17 DIAGNOSIS — G8929 Other chronic pain: Secondary | ICD-10-CM

## 2024-07-17 DIAGNOSIS — E119 Type 2 diabetes mellitus without complications: Secondary | ICD-10-CM

## 2024-07-17 MED ORDER — TIRZEPATIDE 2.5 MG/0.5ML ~~LOC~~ SOAJ: 2.5000 mg | SUBCUTANEOUS | 1 refills | Status: DC

## 2024-07-18 ENCOUNTER — Encounter: Payer: Self-pay | Admitting: Radiology

## 2024-07-19 ENCOUNTER — Other Ambulatory Visit: Payer: Self-pay | Admitting: Family

## 2024-07-19 DIAGNOSIS — E119 Type 2 diabetes mellitus without complications: Secondary | ICD-10-CM

## 2024-07-19 NOTE — Telephone Encounter (Unsigned)
 Copied from CRM [id number]. Topic: Clinical - Medication Prior Auth >> Jul 19, 2024 12:45 PM Macario HERO wrote: Reason for CRM: Patient called regarding prior authorization for tirzepatide (MOUNJARO) 2.5 MG/0.5ML Pen [[id number]] - stated her pharmacy advised her insurance company is requesting a prior authorization for this medication.

## 2024-07-21 ENCOUNTER — Encounter: Payer: Self-pay | Admitting: Oncology

## 2024-07-21 ENCOUNTER — Telehealth: Payer: Self-pay

## 2024-07-21 ENCOUNTER — Ambulatory Visit
Admission: RE | Admit: 2024-07-21 | Discharge: 2024-07-21 | Disposition: A | Discharge status: Home / Self Care | Payer: Worker's Compensation | Attending: Family | Admitting: Family

## 2024-07-21 ENCOUNTER — Other Ambulatory Visit (HOSPITAL_COMMUNITY): Payer: Self-pay

## 2024-07-21 ENCOUNTER — Other Ambulatory Visit: Payer: Self-pay

## 2024-07-21 ENCOUNTER — Other Ambulatory Visit: Payer: Self-pay | Admitting: Family

## 2024-07-21 ENCOUNTER — Ambulatory Visit
Admission: RE | Admit: 2024-07-21 | Discharge: 2024-07-21 | Disposition: A | Discharge status: Home / Self Care | Payer: Worker's Compensation | Source: Ambulatory Visit | Attending: Family | Admitting: Family

## 2024-07-21 DIAGNOSIS — R0789 Other chest pain: Secondary | ICD-10-CM

## 2024-07-21 MED ORDER — CYCLOBENZAPRINE HCL 10 MG PO TABS
10.0000 mg | ORAL_TABLET | Freq: Three times a day (TID) | ORAL | 0 refills | Status: AC | PRN
  Filled 2024-07-21: qty 60, 20d supply, fill #0

## 2024-07-21 NOTE — Telephone Encounter (Signed)
 Pharmacy Patient Advocate Encounter   Received notification from Physician's Office that prior authorization for Mounjaro 2.5MG /0.5ML auto-injectors is required/requested.   Insurance verification completed.   The patient is insured through [facility].   Per test claim: PA required; PA submitted to above mentioned insurance via Latent Key/confirmation #/EOC [facility] Status is pending

## 2024-07-22 ENCOUNTER — Other Ambulatory Visit: Payer: Self-pay

## 2024-07-22 DIAGNOSIS — E119 Type 2 diabetes mellitus without complications: Secondary | ICD-10-CM

## 2024-07-22 MED ORDER — MOUNJARO 2.5 MG/0.5ML ~~LOC~~ SOAJ: 2.5000 mg | SUBCUTANEOUS | 1 refills | Status: DC

## 2024-07-25 ENCOUNTER — Telehealth: Payer: Self-pay

## 2024-07-25 NOTE — Telephone Encounter (Signed)
 I spoke to patient this morning. She cancelled her appointment, she will call to reschedule. She had a fall at work that she is dealing with. She did not do PT or had MRI done.

## 2024-07-26 ENCOUNTER — Telehealth: Payer: Self-pay | Admitting: Family

## 2024-07-26 ENCOUNTER — Ambulatory Visit: Admitting: Physician Assistant

## 2024-07-26 NOTE — Telephone Encounter (Signed)
 Copied from CRM [id number]. Topic: General - Call Back - No Documentation >> Jul 26, 2024 12:02 PM Rea ORN wrote: Reason for CRM: Pt returning missed call from [employer].   Please call back [phone]

## 2024-07-26 NOTE — Telephone Encounter (Signed)
 Copied from CRM #[id number]. Topic: Clinical - Prescription Issue >> Jul 26, 2024 11:12 AM Alexandria E wrote: Reason for CRM: Patient called in stating that her insurance rejected the prior authorization for tirzepatide (MOUNJARO) 2.5 MG/0.5ML Pen, patient's pharmacy stated that PCP's office has to call [phone]. (Patient unsure which department that phone number is for)

## 2024-07-26 NOTE — Telephone Encounter (Signed)
 LVM to call back.

## 2024-07-27 ENCOUNTER — Other Ambulatory Visit (HOSPITAL_COMMUNITY): Payer: Self-pay

## 2024-07-27 NOTE — Telephone Encounter (Signed)
 Spoke to pt informed her that the PA has to be done via RX PORTAL sent to PA team for review will reach back out when we hear something

## 2024-07-27 NOTE — Telephone Encounter (Signed)
 Copied from CRM [id number]. Topic: Clinical - Medication Prior Auth >> Jul 27, 2024 11:38 AM Mesmerise C wrote: Reason for CRM: Patient calling for [facility] to get an update regarding a prescription would like a call back to see if there's any change

## 2024-07-28 ENCOUNTER — Telehealth: Payer: Self-pay

## 2024-07-28 NOTE — Telephone Encounter (Signed)
 Per latent. Im calling # to complete prior auth.

## 2024-07-28 NOTE — Telephone Encounter (Signed)
-----   Message from [place] sent at 07/25/2024  9:04 AM EST ----- Regarding: RE: 8-Week FU Got it. Thank you! ----- Message ----- From: Benn Auston HERO, CMA Sent: 07/25/2024   8:15 AM EST To: Lyle Decamp, PA-C Subject: RE: 8-Week FU                                  I spoke to patient this morning. She cancelled her appointment, she will call to reschedule. She had a fall at work that she is dealing with. She did not do PT or had MRI done. ----- Message ----- From: Decamp Lyle, DEVONNA Sent: 07/22/2024   1:06 PM EST To: [place]on HERO Benn, CMA Subject: RE: 8-Week FU                                  Can we confirm with her that she has not done PT, seen PT, or had an MRI elsewhere before rescheduling her? ----- Message ----- From: Benn Auston HERO, CMA Sent: 07/22/2024   1:05 PM EST To: Lyle Decamp, PA-C Subject: 8-Week FU                                      Patient is scheduled with you on Tuesday for follow up.  Your plan for her was: -Referral made to physical therapy (she had eval for shoulder, not lumber) - eval from orthopedic surgery for right hip pain (Has no appt set-up) - x-rays of lumbar spine as well as of her right hip (has completed) - MRI of lumbar spine  (She did not complete. They reached out to her multiple times, no response.)  Do you want me to reschedule her?  Please advise. Thank you.

## 2024-08-04 ENCOUNTER — Telehealth: Payer: Self-pay

## 2024-08-04 ENCOUNTER — Other Ambulatory Visit (HOSPITAL_COMMUNITY): Payer: Self-pay

## 2024-08-04 NOTE — Telephone Encounter (Signed)
 Pharmacy Patient Advocate Encounter   Received notification from Physician's Office that prior authorization for Mounjaro 2.5 mg/0.5 ml auto injectors is required/requested.   Insurance verification completed.   The patient is insured through [facility] ADVANTAGE/RX ADVANCE.   Per test claim: PA required; PA submitted to above mentioned insurance via Prompt PA Key/confirmation #/EOC [id number] Status is pending

## 2024-08-08 ENCOUNTER — Ambulatory Visit: Payer: Self-pay

## 2024-08-08 NOTE — Telephone Encounter (Signed)
 See documentation below  Copied from CRM #[id number]. Topic: Clinical - Medication Question >> Aug 08, 2024 12:36 PM Taleah C wrote: Reason for CRM: pt called to f/u on pa for mounjaro . I informed her that the request reflects pending at this time and then she asked for a nurse to call her back if they have more information to provide to her. Please advise.

## 2024-08-09 NOTE — Telephone Encounter (Signed)
 Spoke to pt informed her that PA is still pending but as soon as I hear back I would let her know the decision pt verbalized understanding

## 2024-08-09 NOTE — Telephone Encounter (Unsigned)
 Copied from CRM #[id number]. Topic: Clinical - Medication Prior Auth >> Aug 09, 2024  9:02 AM Mesmerise C wrote: Reason for CRM: Patient stated that she spoke with the RX company and they are waiting on chart notes stating that she's a diabetic, stated that when the clinic sends it back to mark it as urgent so they can start processing it

## 2024-08-15 ENCOUNTER — Other Ambulatory Visit (HOSPITAL_COMMUNITY): Payer: Self-pay

## 2024-08-15 NOTE — Telephone Encounter (Signed)
 Sent pt message via my chart, waiting on update from out PA team

## 2024-08-15 NOTE — Telephone Encounter (Signed)
 Additional information has been requested from the patient's insurance in order to proceed with the prior authorization request. Requested information has been sent, or form has been filled out and faxed back to [phone]

## 2024-08-16 NOTE — Telephone Encounter (Unsigned)
 Copied from CRM #[id number]. Topic: Clinical - Prescription Issue >> Aug 16, 2024 10:09 AM Corin V wrote: Reason for CRM: Alfonso with [employer] calling to advise that they are missing information to approve the mounjaro . She has a 2023 diagnosis of diabetes and prior authorization department is needing something from last 12 months. The are requesting a recent OV note or letter to be faxed to [phone].

## 2024-08-16 NOTE — Telephone Encounter (Signed)
 Copied from CRM #[id number]. Topic: Clinical - Medication Prior Auth >> Aug 16, 2024  9:55 AM Revonda D wrote: Reason for CRM: Alfonso with the pt's insurance is requesting an urgent review for the PA for the mounjaro . Alfonso stated that if the urgent review is submitted the pt can get the medication within 24-72 hours.RA:[id number].

## 2024-08-16 NOTE — Telephone Encounter (Unsigned)
 Copied from CRM #[id number]. Topic: Clinical - Prescription Issue >> Aug 16, 2024 10:51 AM Anairis L wrote: Reason for CRM: Patient is calling in because insurance informed her that if provider requested that Auth is pushed Urgent, she can get her medication refilled sooner.   tirzepatide  (MOUNJARO ) 2.5 MG/0.5ML Pen

## 2024-08-16 NOTE — Telephone Encounter (Signed)
 See telephone call from 08/16/24

## 2024-08-17 ENCOUNTER — Other Ambulatory Visit (HOSPITAL_COMMUNITY): Payer: Self-pay

## 2024-08-17 ENCOUNTER — Telehealth: Payer: Self-pay

## 2024-08-17 ENCOUNTER — Encounter: Payer: Self-pay | Admitting: Oncology

## 2024-08-17 NOTE — Telephone Encounter (Signed)
 Pharmacy Patient Advocate Encounter   Received notification from Physician's Office that prior authorization for Mounjaro  2.5 mg/0.5 ml is required/requested.   Insurance verification completed.   The patient is insured through [facility].   Per test claim: PA required; PA submitted to above mentioned insurance via Prompt PA Key/confirmation #/[id number] Status is pending

## 2024-08-17 NOTE — Telephone Encounter (Signed)
 Note for 07/25/2024  I spoke to patient this morning. She cancelled her appointment, she will call to reschedule. She had a fall at work that she is dealing with. She did not do PT or had MRI done.

## 2024-08-17 NOTE — Telephone Encounter (Signed)
 Spoke to pt she is aawre of message below also per Rollene she can come pick up samples of Mounjaro  that we have in office, I am placing 2 boxes of 2.5 to the side for pt to pick up. Pt is aware and will pick up samples today 08/17/24

## 2024-08-17 NOTE — Telephone Encounter (Signed)
 Pt came in and picked up samples of Mounjaro  2 boxes of the initial dose 2.5 @ 4:10 pm

## 2024-08-18 ENCOUNTER — Other Ambulatory Visit (HOSPITAL_COMMUNITY): Payer: Self-pay

## 2024-08-23 ENCOUNTER — Telehealth: Payer: Self-pay

## 2024-08-23 ENCOUNTER — Other Ambulatory Visit (HOSPITAL_COMMUNITY): Payer: Self-pay

## 2024-08-23 NOTE — Telephone Encounter (Signed)
 Pharmacy Patient Advocate Encounter   Received notification from Onbase that prior authorization for MOUNJARO  2.5 MG/0.5 ML PEN is required/requested.   Insurance verification completed.   The patient is insured through [facility].   Per test claim: PA required; PA submitted to above mentioned insurance via Prompt PA Key/confirmation #/EOC [id number] Status is pending

## 2024-08-24 ENCOUNTER — Telehealth: Payer: Self-pay

## 2024-08-24 ENCOUNTER — Other Ambulatory Visit (HOSPITAL_COMMUNITY): Payer: Self-pay

## 2024-08-24 DIAGNOSIS — E119 Type 2 diabetes mellitus without complications: Secondary | ICD-10-CM

## 2024-08-24 NOTE — Addendum Note (Signed)
 Addended by: Melvie Paglia G on: 08/24/2024 01:26 PM   Modules accepted: Orders

## 2024-08-24 NOTE — Telephone Encounter (Signed)
 Copied from CRM #[id number]. Topic: Clinical - Medication Question >> Aug 24, 2024 11:50 AM Deaijah H wrote: Reason for CRM: Patient would like medication MOUNJARO  2.5 MG/0.5 ML PEN script sent to  [id number] [place] [place] - [address] [place] [address] Phone: [phone] Fax: [phone]

## 2024-08-24 NOTE — Telephone Encounter (Signed)
 Pharmacy Patient Advocate Encounter  Received notification from [employer] that Prior Authorization for MOUNJARO  2.5 MG/0.5 ML PEN  has been APPROVED from 08/23/24 to 08/22/25. Ran test claim, Copay is $20. This test claim was processed through [facility]- copay amounts may vary at other pharmacies due to pharmacy/plan contracts, or as the patient moves through the different stages of their insurance plan.   PA #/Case ID/Reference #: [id number]

## 2024-08-25 ENCOUNTER — Other Ambulatory Visit (HOSPITAL_COMMUNITY): Payer: Self-pay

## 2024-08-25 NOTE — Telephone Encounter (Signed)
 Pharmacy Patient Advocate Encounter  Received notification from [employer] that Prior Authorization for MOUNJARO  2.5 MG/0.5 ML PEN has been APPROVED from 08/23/2024 to 08/23/2025. Ran test claim, Copay is $20. This test claim was processed through [facility]- copay amounts may vary at other pharmacies due to pharmacy/plan contracts, or as the patient moves through the different stages of their insurance plan.   PA #/Case ID/Reference #: [id number]

## 2024-08-25 NOTE — Telephone Encounter (Signed)
 noted

## 2024-09-12 NOTE — Telephone Encounter (Unsigned)
 Copied from CRM [id number]. Topic: Clinical - Medication Question >> Sep 12, 2024 11:48 AM Eva FALCON wrote: Reason for CRM: Pt was calling in in regards to her Monjauro. States she has been on the same dose for the past month, and is wondering when her PCP is going to up her dosage. Please call back [phone].

## 2024-09-13 MED ORDER — TIRZEPATIDE 5 MG/0.5ML ~~LOC~~ SOAJ: 5.0000 mg | SUBCUTANEOUS | 2 refills | Status: DC

## 2024-09-13 NOTE — Addendum Note (Signed)
 Addended by: DINEEN ROLLENE MATSU on: 09/13/2024 09:05 AM   Modules accepted: Orders

## 2024-09-13 NOTE — Telephone Encounter (Signed)
 Call pt I have sent in mounjaro  5mg   Please ensure she has completed mounjaro  2.5mg  x 4 weeks

## 2024-09-13 NOTE — Telephone Encounter (Signed)
 Pt has been informed of new dosage sent in

## 2024-09-21 ENCOUNTER — Ambulatory Visit: Admitting: Family

## 2024-10-04 ENCOUNTER — Telehealth: Payer: Self-pay | Admitting: *Deleted

## 2024-10-04 NOTE — Telephone Encounter (Unsigned)
 Copied from CRM [id number]. Topic: Clinical - Medication Refill >> Oct 04, 2024 10:57 AM Burnard DEL wrote: Medication: Mounjaro  7.5 mg(increased dosage) Has the patient contacted their pharmacy? No (Agent: If no, request that the patient contact the pharmacy for the refill. If patient does not wish to contact the pharmacy document the reason why and proceed with request.) (Agent: If yes, when and what did the pharmacy advise?)  This is the patient's preferred pharmacy:  [id number] [place], [place] - [address] [place] [address] Phone: [phone] Fax: [phone]     Is this the correct pharmacy for this prescription? Yes If no, delete pharmacy and type the correct one.   Has the prescription been filled recently? yes  Is the patient out of the medication? Yes  Has the patient been seen for an appointment in the last year OR does the patient have an upcoming appointment? Yes  Can we respond through MyChart? Yes  Agent: Please be advised that Rx refills may take up to 3 business days. We ask that you follow-up with your pharmacy.

## 2024-10-06 ENCOUNTER — Other Ambulatory Visit: Payer: Self-pay | Admitting: Family

## 2024-10-06 DIAGNOSIS — E119 Type 2 diabetes mellitus without complications: Secondary | ICD-10-CM

## 2024-10-06 MED ORDER — TIRZEPATIDE 7.5 MG/0.5ML ~~LOC~~ SOAJ: 7.5000 mg | SUBCUTANEOUS | 2 refills | Status: AC

## 2024-10-15 ENCOUNTER — Other Ambulatory Visit: Payer: Self-pay | Admitting: Family

## 2024-10-15 DIAGNOSIS — F418 Other specified anxiety disorders: Secondary | ICD-10-CM

## 2024-11-23 ENCOUNTER — Ambulatory Visit: Admitting: Family
